# Patient Record
Sex: Male | Born: 1945 | Race: Black or African American | Hispanic: No | Marital: Single | Smoking: Current some day smoker
Health system: Southern US, Community
[De-identification: ages and names within clinical notes are randomized; demographics above are authoritative.]

## PROBLEM LIST (undated history)

## (undated) DIAGNOSIS — L723 Sebaceous cyst: Secondary | ICD-10-CM

## (undated) DIAGNOSIS — M109 Gout, unspecified: Secondary | ICD-10-CM

## (undated) DIAGNOSIS — I2510 Atherosclerotic heart disease of native coronary artery without angina pectoris: Secondary | ICD-10-CM

## (undated) DIAGNOSIS — E669 Obesity, unspecified: Secondary | ICD-10-CM

## (undated) DIAGNOSIS — I10 Essential (primary) hypertension: Secondary | ICD-10-CM

## (undated) DIAGNOSIS — A5149 Other secondary syphilitic conditions: Secondary | ICD-10-CM

## (undated) DIAGNOSIS — H269 Unspecified cataract: Secondary | ICD-10-CM

## (undated) DIAGNOSIS — F1910 Other psychoactive substance abuse, uncomplicated: Secondary | ICD-10-CM

## (undated) DIAGNOSIS — F1410 Cocaine abuse, uncomplicated: Secondary | ICD-10-CM

## (undated) DIAGNOSIS — F1010 Alcohol abuse, uncomplicated: Secondary | ICD-10-CM

## (undated) DIAGNOSIS — W3400XA Accidental discharge from unspecified firearms or gun, initial encounter: Secondary | ICD-10-CM

## (undated) DIAGNOSIS — B1920 Unspecified viral hepatitis C without hepatic coma: Secondary | ICD-10-CM

## (undated) DIAGNOSIS — E039 Hypothyroidism, unspecified: Secondary | ICD-10-CM

## (undated) DIAGNOSIS — F32A Depression, unspecified: Secondary | ICD-10-CM

## (undated) DIAGNOSIS — K7460 Unspecified cirrhosis of liver: Secondary | ICD-10-CM

## (undated) DIAGNOSIS — N189 Chronic kidney disease, unspecified: Secondary | ICD-10-CM

## (undated) DIAGNOSIS — E785 Hyperlipidemia, unspecified: Secondary | ICD-10-CM

## (undated) DIAGNOSIS — M2550 Pain in unspecified joint: Secondary | ICD-10-CM

## (undated) DIAGNOSIS — F329 Major depressive disorder, single episode, unspecified: Secondary | ICD-10-CM

## (undated) HISTORY — PX: MULTIPLE TOOTH EXTRACTIONS: SHX2053

## (undated) HISTORY — PX: OTHER SURGICAL HISTORY: SHX169

## (undated) HISTORY — DX: Hyperlipidemia, unspecified: E78.5

## (undated) HISTORY — DX: Sebaceous cyst: L72.3

## (undated) HISTORY — DX: Alcohol abuse, uncomplicated: F10.10

## (undated) HISTORY — DX: Other secondary syphilitic conditions: A51.49

## (undated) HISTORY — DX: Chronic kidney disease, unspecified: N18.9

## (undated) HISTORY — DX: Obesity, unspecified: E66.9

## (undated) HISTORY — DX: Depression, unspecified: F32.A

## (undated) HISTORY — DX: Unspecified cataract: H26.9

## (undated) HISTORY — DX: Major depressive disorder, single episode, unspecified: F32.9

## (undated) HISTORY — DX: Essential (primary) hypertension: I10

## (undated) HISTORY — DX: Unspecified viral hepatitis C without hepatic coma: B19.20

---

## 1997-06-06 ENCOUNTER — Emergency Department (HOSPITAL_COMMUNITY): Admission: EM | Admit: 1997-06-06 | Discharge: 1997-06-06 | Payer: Self-pay | Admitting: *Deleted

## 1997-06-11 ENCOUNTER — Encounter: Admission: RE | Admit: 1997-06-11 | Discharge: 1997-06-11 | Payer: Self-pay | Admitting: Hematology and Oncology

## 1998-05-30 ENCOUNTER — Emergency Department (HOSPITAL_COMMUNITY): Admission: EM | Admit: 1998-05-30 | Discharge: 1998-05-30 | Payer: Self-pay | Admitting: Emergency Medicine

## 1998-05-30 ENCOUNTER — Encounter: Payer: Self-pay | Admitting: Emergency Medicine

## 1998-06-01 ENCOUNTER — Encounter: Payer: Self-pay | Admitting: Emergency Medicine

## 1998-06-01 ENCOUNTER — Emergency Department (HOSPITAL_COMMUNITY): Admission: EM | Admit: 1998-06-01 | Discharge: 1998-06-01 | Payer: Self-pay | Admitting: Emergency Medicine

## 1998-06-02 ENCOUNTER — Emergency Department (HOSPITAL_COMMUNITY): Admission: EM | Admit: 1998-06-02 | Discharge: 1998-06-02 | Payer: Self-pay | Admitting: Emergency Medicine

## 1998-06-04 ENCOUNTER — Emergency Department (HOSPITAL_COMMUNITY): Admission: EM | Admit: 1998-06-04 | Discharge: 1998-06-04 | Payer: Self-pay | Admitting: Emergency Medicine

## 1998-06-09 ENCOUNTER — Encounter: Admission: RE | Admit: 1998-06-09 | Discharge: 1998-06-09 | Payer: Self-pay | Admitting: Internal Medicine

## 1999-07-23 ENCOUNTER — Emergency Department (HOSPITAL_COMMUNITY): Admission: EM | Admit: 1999-07-23 | Discharge: 1999-07-23 | Payer: Self-pay | Admitting: Emergency Medicine

## 2005-01-27 ENCOUNTER — Emergency Department (HOSPITAL_COMMUNITY): Admission: EM | Admit: 2005-01-27 | Discharge: 2005-01-27 | Payer: Self-pay | Admitting: Family Medicine

## 2005-07-25 ENCOUNTER — Ambulatory Visit: Payer: Self-pay | Admitting: Internal Medicine

## 2005-07-31 ENCOUNTER — Ambulatory Visit: Payer: Self-pay | Admitting: Internal Medicine

## 2005-08-07 ENCOUNTER — Ambulatory Visit: Payer: Self-pay | Admitting: Internal Medicine

## 2005-08-14 ENCOUNTER — Ambulatory Visit: Payer: Self-pay | Admitting: Internal Medicine

## 2005-09-14 ENCOUNTER — Ambulatory Visit: Payer: Self-pay | Admitting: Internal Medicine

## 2005-10-13 ENCOUNTER — Ambulatory Visit: Payer: Self-pay | Admitting: Internal Medicine

## 2005-10-27 ENCOUNTER — Ambulatory Visit: Payer: Self-pay | Admitting: Internal Medicine

## 2005-11-06 ENCOUNTER — Encounter (INDEPENDENT_AMBULATORY_CARE_PROVIDER_SITE_OTHER): Payer: Self-pay | Admitting: Internal Medicine

## 2005-11-06 ENCOUNTER — Ambulatory Visit: Payer: Self-pay | Admitting: Internal Medicine

## 2005-11-06 LAB — CONVERTED CEMR LAB: HCV Quantitative: 2190000 intl units/mL — ABNORMAL HIGH (ref <5)

## 2007-05-22 ENCOUNTER — Ambulatory Visit: Payer: Self-pay | Admitting: Hospitalist

## 2007-05-22 ENCOUNTER — Encounter (INDEPENDENT_AMBULATORY_CARE_PROVIDER_SITE_OTHER): Payer: Self-pay | Admitting: *Deleted

## 2007-05-22 DIAGNOSIS — F1010 Alcohol abuse, uncomplicated: Secondary | ICD-10-CM | POA: Insufficient documentation

## 2007-05-22 DIAGNOSIS — E785 Hyperlipidemia, unspecified: Secondary | ICD-10-CM

## 2007-05-22 DIAGNOSIS — F17200 Nicotine dependence, unspecified, uncomplicated: Secondary | ICD-10-CM

## 2007-05-22 DIAGNOSIS — I10 Essential (primary) hypertension: Secondary | ICD-10-CM | POA: Insufficient documentation

## 2007-05-22 DIAGNOSIS — B1710 Acute hepatitis C without hepatic coma: Secondary | ICD-10-CM | POA: Insufficient documentation

## 2007-05-22 DIAGNOSIS — E669 Obesity, unspecified: Secondary | ICD-10-CM

## 2007-05-22 DIAGNOSIS — D696 Thrombocytopenia, unspecified: Secondary | ICD-10-CM

## 2007-05-22 DIAGNOSIS — Z9189 Other specified personal risk factors, not elsewhere classified: Secondary | ICD-10-CM | POA: Insufficient documentation

## 2007-05-23 LAB — CONVERTED CEMR LAB
ALT: 65 units/L — ABNORMAL HIGH (ref 0–53)
AST: 62 units/L — ABNORMAL HIGH (ref 0–37)
Albumin: 3.7 g/dL (ref 3.5–5.2)
Alkaline Phosphatase: 105 units/L (ref 39–117)
BUN: 12 mg/dL (ref 6–23)
CO2: 27 meq/L (ref 19–32)
Calcium: 9.5 mg/dL (ref 8.4–10.5)
Chloride: 107 meq/L (ref 96–112)
Cholesterol: 147 mg/dL (ref 0–200)
Creatinine, Ser: 1.23 mg/dL (ref 0.40–1.50)
Glucose, Bld: 105 mg/dL — ABNORMAL HIGH (ref 70–99)
HCT: 42.3 % (ref 39.0–52.0)
HDL: 25 mg/dL — ABNORMAL LOW (ref >39)
Hemoglobin: 14.4 g/dL (ref 13.0–17.0)
INR: 1 (ref 0.0–1.5)
MCHC: 34 g/dL (ref 30.0–36.0)
MCV: 85.7 fL (ref 78.0–100.0)
Platelets: 120 10*3/uL — ABNORMAL LOW (ref 150–400)
Potassium: 4.1 meq/L (ref 3.5–5.3)
Prothrombin Time: 13.5 s (ref 11.6–15.2)
RBC: 4.94 M/uL (ref 4.22–5.81)
RDW: 13.4 % (ref 11.5–15.5)
Sodium: 140 meq/L (ref 135–145)
TSH: 3.116 microintl units/mL (ref 0.350–5.50)
Total Bilirubin: 0.9 mg/dL (ref 0.3–1.2)
Total CHOL/HDL Ratio: 5.9
Total Protein: 8.3 g/dL (ref 6.0–8.3)
Triglycerides: 428 mg/dL — ABNORMAL HIGH (ref <150)
WBC: 7.6 10*3/uL (ref 4.0–10.5)

## 2007-05-28 ENCOUNTER — Emergency Department (HOSPITAL_COMMUNITY): Admission: EM | Admit: 2007-05-28 | Discharge: 2007-05-29 | Payer: Self-pay | Admitting: Emergency Medicine

## 2007-06-06 ENCOUNTER — Encounter: Payer: Self-pay | Admitting: Licensed Clinical Social Worker

## 2007-06-06 ENCOUNTER — Encounter (INDEPENDENT_AMBULATORY_CARE_PROVIDER_SITE_OTHER): Payer: Self-pay | Admitting: Internal Medicine

## 2007-06-06 ENCOUNTER — Ambulatory Visit: Payer: Self-pay | Admitting: Internal Medicine

## 2007-06-07 ENCOUNTER — Encounter (INDEPENDENT_AMBULATORY_CARE_PROVIDER_SITE_OTHER): Payer: Self-pay | Admitting: Internal Medicine

## 2007-06-07 LAB — CONVERTED CEMR LAB
BUN: 18 mg/dL (ref 6–23)
CO2: 24 meq/L (ref 19–32)
Calcium: 9.4 mg/dL (ref 8.4–10.5)
Chloride: 106 meq/L (ref 96–112)
Creatinine, Ser: 1.21 mg/dL (ref 0.40–1.50)
Glucose, Bld: 117 mg/dL — ABNORMAL HIGH (ref 70–99)
HCV Ab: POSITIVE — AB
Potassium: 4.7 meq/L (ref 3.5–5.3)
Sodium: 141 meq/L (ref 135–145)

## 2007-06-10 ENCOUNTER — Ambulatory Visit (HOSPITAL_COMMUNITY): Admission: RE | Admit: 2007-06-10 | Discharge: 2007-06-10 | Payer: Self-pay | Admitting: Internal Medicine

## 2007-06-11 ENCOUNTER — Telehealth (INDEPENDENT_AMBULATORY_CARE_PROVIDER_SITE_OTHER): Payer: Self-pay | Admitting: Internal Medicine

## 2007-06-11 ENCOUNTER — Telehealth: Payer: Self-pay | Admitting: *Deleted

## 2007-06-28 ENCOUNTER — Encounter (INDEPENDENT_AMBULATORY_CARE_PROVIDER_SITE_OTHER): Payer: Self-pay | Admitting: Internal Medicine

## 2007-06-28 ENCOUNTER — Ambulatory Visit: Payer: Self-pay | Admitting: Internal Medicine

## 2007-07-02 LAB — CONVERTED CEMR LAB: HCV Quantitative: 3360000 intl units/mL — ABNORMAL HIGH (ref <43)

## 2007-07-03 ENCOUNTER — Encounter (INDEPENDENT_AMBULATORY_CARE_PROVIDER_SITE_OTHER): Payer: Self-pay | Admitting: *Deleted

## 2007-07-03 ENCOUNTER — Ambulatory Visit: Payer: Self-pay | Admitting: *Deleted

## 2007-07-03 ENCOUNTER — Encounter (INDEPENDENT_AMBULATORY_CARE_PROVIDER_SITE_OTHER): Payer: Self-pay | Admitting: Internal Medicine

## 2007-07-03 DIAGNOSIS — K769 Liver disease, unspecified: Secondary | ICD-10-CM | POA: Insufficient documentation

## 2007-07-03 LAB — CONVERTED CEMR LAB
Hep B Core Total Ab: POSITIVE — AB
Hepatitis B Surface Ag: NEGATIVE

## 2007-07-05 ENCOUNTER — Telehealth: Payer: Self-pay | Admitting: *Deleted

## 2007-07-16 ENCOUNTER — Ambulatory Visit: Payer: Self-pay | Admitting: Internal Medicine

## 2007-07-16 ENCOUNTER — Encounter (INDEPENDENT_AMBULATORY_CARE_PROVIDER_SITE_OTHER): Payer: Self-pay | Admitting: *Deleted

## 2007-07-16 ENCOUNTER — Encounter (INDEPENDENT_AMBULATORY_CARE_PROVIDER_SITE_OTHER): Payer: Self-pay | Admitting: Internal Medicine

## 2007-07-16 DIAGNOSIS — B181 Chronic viral hepatitis B without delta-agent: Secondary | ICD-10-CM

## 2007-07-16 LAB — CONVERTED CEMR LAB: Hep B S Ab: POSITIVE — AB

## 2007-08-06 ENCOUNTER — Telehealth: Payer: Self-pay | Admitting: *Deleted

## 2007-09-03 ENCOUNTER — Telehealth: Payer: Self-pay | Admitting: *Deleted

## 2007-09-06 ENCOUNTER — Encounter: Payer: Self-pay | Admitting: *Deleted

## 2007-09-06 ENCOUNTER — Ambulatory Visit: Payer: Self-pay | Admitting: *Deleted

## 2007-09-06 ENCOUNTER — Encounter (INDEPENDENT_AMBULATORY_CARE_PROVIDER_SITE_OTHER): Payer: Self-pay | Admitting: Internal Medicine

## 2007-09-06 DIAGNOSIS — N183 Chronic kidney disease, stage 3 unspecified: Secondary | ICD-10-CM | POA: Insufficient documentation

## 2007-09-06 LAB — CONVERTED CEMR LAB
ALT: 58 units/L — ABNORMAL HIGH (ref 0–53)
AST: 65 units/L — ABNORMAL HIGH (ref 0–37)
Albumin: 3.3 g/dL — ABNORMAL LOW (ref 3.5–5.2)
Alkaline Phosphatase: 97 units/L (ref 39–117)
BUN: 19 mg/dL (ref 6–23)
Basophils Absolute: 0.2 10*3/uL — ABNORMAL HIGH (ref 0.0–0.1)
Basophils Relative: 0 % (ref 0–1)
CO2: 26 meq/L (ref 19–32)
Calcium: 8.9 mg/dL (ref 8.4–10.5)
Chloride: 108 meq/L (ref 96–112)
Creatinine, Ser: 1.7 mg/dL — ABNORMAL HIGH (ref 0.40–1.50)
Eosinophils Absolute: 0.2 10*3/uL (ref 0.0–0.7)
Eosinophils Relative: 3 % (ref 0–5)
Glucose, Bld: 113 mg/dL — ABNORMAL HIGH (ref 70–99)
HCT: 38.9 % — ABNORMAL LOW (ref 39.0–52.0)
Hemoglobin: 13.4 g/dL (ref 13.0–17.0)
INR: 1 (ref 0.0–1.5)
Lipase: 51 units/L (ref 11–59)
Lymphocytes Relative: 43 % (ref 12–46)
Lymphs Abs: 3.3 10*3/uL (ref 0.7–4.0)
MCHC: 34.5 g/dL (ref 30.0–36.0)
MCV: 86.1 fL (ref 78.0–100.0)
Monocytes Absolute: 1 10*3/uL (ref 0.1–1.0)
Monocytes Relative: 13 % — ABNORMAL HIGH (ref 3–12)
Neutro Abs: 3.2 10*3/uL (ref 1.7–7.7)
Neutrophils Relative %: 41 % — ABNORMAL LOW (ref 43–77)
Platelets: 121 10*3/uL — ABNORMAL LOW (ref 150–400)
Potassium: 4.1 meq/L (ref 3.5–5.3)
Prothrombin Time: 13.5 s (ref 11.6–15.2)
RBC: 4.53 M/uL (ref 4.22–5.81)
RDW: 13.5 % (ref 11.5–15.5)
Sodium: 141 meq/L (ref 135–145)
Total Bilirubin: 0.9 mg/dL (ref 0.3–1.2)
Total Protein: 7.3 g/dL (ref 6.0–8.3)
WBC: 7.7 10*3/uL (ref 4.0–10.5)

## 2007-09-11 ENCOUNTER — Ambulatory Visit: Payer: Self-pay | Admitting: Infectious Diseases

## 2007-09-11 ENCOUNTER — Encounter (INDEPENDENT_AMBULATORY_CARE_PROVIDER_SITE_OTHER): Payer: Self-pay | Admitting: *Deleted

## 2007-09-12 DIAGNOSIS — R799 Abnormal finding of blood chemistry, unspecified: Secondary | ICD-10-CM

## 2007-09-12 LAB — CONVERTED CEMR LAB
AFP-Tumor Marker: 25.9 ng/mL — ABNORMAL HIGH (ref 0.0–8.0)
ALT: 47 units/L (ref 0–53)
AST: 52 units/L — ABNORMAL HIGH (ref 0–37)
Albumin: 3.7 g/dL (ref 3.5–5.2)
Alkaline Phosphatase: 95 units/L (ref 39–117)
BUN: 22 mg/dL (ref 6–23)
CO2: 21 meq/L (ref 19–32)
Calcium: 8.6 mg/dL (ref 8.4–10.5)
Chloride: 111 meq/L (ref 96–112)
Creatinine, Ser: 1.39 mg/dL (ref 0.40–1.50)
Glucose, Bld: 125 mg/dL — ABNORMAL HIGH (ref 70–99)
Potassium: 4 meq/L (ref 3.5–5.3)
Sodium: 144 meq/L (ref 135–145)
Total Bilirubin: 0.7 mg/dL (ref 0.3–1.2)
Total Protein: 7.5 g/dL (ref 6.0–8.3)

## 2007-09-16 ENCOUNTER — Ambulatory Visit (HOSPITAL_COMMUNITY): Admission: RE | Admit: 2007-09-16 | Discharge: 2007-09-16 | Payer: Self-pay | Admitting: *Deleted

## 2007-09-24 ENCOUNTER — Telehealth (INDEPENDENT_AMBULATORY_CARE_PROVIDER_SITE_OTHER): Payer: Self-pay | Admitting: Internal Medicine

## 2007-11-15 ENCOUNTER — Encounter (INDEPENDENT_AMBULATORY_CARE_PROVIDER_SITE_OTHER): Payer: Self-pay | Admitting: Internal Medicine

## 2007-11-15 ENCOUNTER — Ambulatory Visit: Payer: Self-pay | Admitting: Internal Medicine

## 2007-11-20 LAB — CONVERTED CEMR LAB
ALT: 47 units/L (ref 0–53)
AST: 57 units/L — ABNORMAL HIGH (ref 0–37)
Albumin: 3.5 g/dL (ref 3.5–5.2)
Alkaline Phosphatase: 94 units/L (ref 39–117)
BUN: 10 mg/dL (ref 6–23)
CO2: 26 meq/L (ref 19–32)
Calcium: 9.5 mg/dL (ref 8.4–10.5)
Chloride: 110 meq/L (ref 96–112)
Creatinine, Ser: 1.07 mg/dL (ref 0.40–1.50)
Creatinine, Urine: 153.3 mg/dL
Glucose, Bld: 96 mg/dL (ref 70–99)
HCT: 40 % (ref 39.0–52.0)
Hemoglobin: 13.7 g/dL (ref 13.0–17.0)
INR: 1 (ref 0.0–1.5)
MCHC: 29.1 g/dL — ABNORMAL LOW (ref 30.0–36.0)
MCV: 84.8 fL (ref 78.0–100.0)
Microalb Creat Ratio: 35.8 mg/g — ABNORMAL HIGH (ref 0.0–30.0)
Microalb, Ur: 5.49 mg/dL — ABNORMAL HIGH (ref 0.00–1.89)
Platelets: 159 10*3/uL (ref 150–400)
Potassium: 4.2 meq/L (ref 3.5–5.3)
Prothrombin Time: 13.9 s (ref 11.6–15.2)
RBC: 4.72 M/uL (ref 4.22–5.81)
RDW: 13.1 % (ref 11.5–15.5)
Sodium: 143 meq/L (ref 135–145)
Total Bilirubin: 0.6 mg/dL (ref 0.3–1.2)
Total Protein: 7.6 g/dL (ref 6.0–8.3)
WBC: 7 10*3/uL (ref 4.0–10.5)

## 2008-01-13 ENCOUNTER — Emergency Department (HOSPITAL_COMMUNITY): Admission: EM | Admit: 2008-01-13 | Discharge: 2008-01-13 | Payer: Self-pay | Admitting: Emergency Medicine

## 2008-01-14 ENCOUNTER — Emergency Department (HOSPITAL_COMMUNITY): Admission: EM | Admit: 2008-01-14 | Discharge: 2008-01-14 | Payer: Self-pay | Admitting: Emergency Medicine

## 2008-01-16 ENCOUNTER — Emergency Department (HOSPITAL_COMMUNITY): Admission: EM | Admit: 2008-01-16 | Discharge: 2008-01-16 | Payer: Self-pay | Admitting: Family Medicine

## 2008-01-20 ENCOUNTER — Emergency Department (HOSPITAL_COMMUNITY): Admission: EM | Admit: 2008-01-20 | Discharge: 2008-01-20 | Payer: Self-pay | Admitting: Emergency Medicine

## 2008-04-14 ENCOUNTER — Ambulatory Visit: Payer: Self-pay | Admitting: *Deleted

## 2008-04-14 ENCOUNTER — Encounter (INDEPENDENT_AMBULATORY_CARE_PROVIDER_SITE_OTHER): Payer: Self-pay | Admitting: Internal Medicine

## 2008-04-15 LAB — CONVERTED CEMR LAB
ALT: 54 units/L — ABNORMAL HIGH (ref 0–53)
AST: 61 units/L — ABNORMAL HIGH (ref 0–37)
Albumin: 3.5 g/dL (ref 3.5–5.2)
Alkaline Phosphatase: 113 units/L (ref 39–117)
BUN: 11 mg/dL (ref 6–23)
Basophils Absolute: 0 10*3/uL (ref 0.0–0.1)
Basophils Relative: 0 % (ref 0–1)
CO2: 29 meq/L (ref 19–32)
Calcium: 9.4 mg/dL (ref 8.4–10.5)
Chloride: 108 meq/L (ref 96–112)
Cholesterol: 150 mg/dL (ref 0–200)
Creatinine, Ser: 1.14 mg/dL (ref 0.40–1.50)
Eosinophils Absolute: 0.1 10*3/uL (ref 0.0–0.7)
Eosinophils Relative: 1 % (ref 0–5)
Glucose, Bld: 100 mg/dL — ABNORMAL HIGH (ref 70–99)
HCT: 43.7 % (ref 39.0–52.0)
HDL: 23 mg/dL — ABNORMAL LOW (ref >39)
Hemoglobin: 15.1 g/dL (ref 13.0–17.0)
INR: 1.1 (ref 0.0–1.5)
LDL Cholesterol: 75 mg/dL (ref 0–99)
Lymphocytes Relative: 43 % (ref 12–46)
Lymphs Abs: 3.2 10*3/uL (ref 0.7–4.0)
MCHC: 29.2 g/dL — ABNORMAL LOW (ref 30.0–36.0)
MCV: 84.5 fL (ref 78.0–100.0)
Monocytes Absolute: 0.7 10*3/uL (ref 0.1–1.0)
Monocytes Relative: 10 % (ref 3–12)
Neutro Abs: 3.4 10*3/uL (ref 1.7–7.7)
Neutrophils Relative %: 46 % (ref 43–77)
Platelets: 117 10*3/uL — ABNORMAL LOW (ref 150–400)
Potassium: 4.1 meq/L (ref 3.5–5.3)
Prothrombin Time: 14.3 s (ref 11.6–15.2)
RBC: 5.18 M/uL (ref 4.22–5.81)
RDW: 13.7 % (ref 11.5–15.5)
Sodium: 142 meq/L (ref 135–145)
TSH: 1.641 microintl units/mL (ref 0.350–4.500)
Total Bilirubin: 0.7 mg/dL (ref 0.3–1.2)
Total CHOL/HDL Ratio: 6.5
Total Protein: 7.6 g/dL (ref 6.0–8.3)
Triglycerides: 262 mg/dL — ABNORMAL HIGH (ref <150)
VLDL: 52 mg/dL — ABNORMAL HIGH (ref 0–40)
WBC: 7.4 10*3/uL (ref 4.0–10.5)

## 2008-05-07 ENCOUNTER — Ambulatory Visit: Payer: Self-pay | Admitting: Gastroenterology

## 2008-05-07 ENCOUNTER — Encounter (INDEPENDENT_AMBULATORY_CARE_PROVIDER_SITE_OTHER): Payer: Self-pay | Admitting: Internal Medicine

## 2008-05-15 ENCOUNTER — Ambulatory Visit: Payer: Self-pay | Admitting: Gastroenterology

## 2008-05-18 ENCOUNTER — Encounter: Payer: Self-pay | Admitting: Gastroenterology

## 2008-05-19 ENCOUNTER — Encounter (INDEPENDENT_AMBULATORY_CARE_PROVIDER_SITE_OTHER): Payer: Self-pay | Admitting: Internal Medicine

## 2008-05-26 ENCOUNTER — Ambulatory Visit: Payer: Self-pay | Admitting: Gastroenterology

## 2008-05-27 ENCOUNTER — Ambulatory Visit: Payer: Self-pay | Admitting: Gastroenterology

## 2008-05-27 ENCOUNTER — Encounter: Payer: Self-pay | Admitting: Gastroenterology

## 2008-05-28 ENCOUNTER — Telehealth: Payer: Self-pay | Admitting: Gastroenterology

## 2008-05-29 ENCOUNTER — Encounter: Payer: Self-pay | Admitting: Gastroenterology

## 2008-06-02 ENCOUNTER — Encounter: Payer: Self-pay | Admitting: Gastroenterology

## 2008-06-02 DIAGNOSIS — I8500 Esophageal varices without bleeding: Secondary | ICD-10-CM

## 2008-06-04 ENCOUNTER — Encounter: Payer: Self-pay | Admitting: Gastroenterology

## 2008-07-16 ENCOUNTER — Ambulatory Visit (HOSPITAL_COMMUNITY): Admission: RE | Admit: 2008-07-16 | Discharge: 2008-07-16 | Payer: Self-pay | Admitting: Gastroenterology

## 2008-07-21 ENCOUNTER — Ambulatory Visit: Payer: Self-pay | Admitting: Internal Medicine

## 2008-10-14 ENCOUNTER — Emergency Department (HOSPITAL_COMMUNITY): Admission: EM | Admit: 2008-10-14 | Discharge: 2008-10-14 | Payer: Self-pay | Admitting: Family Medicine

## 2008-10-20 ENCOUNTER — Ambulatory Visit (HOSPITAL_COMMUNITY): Admission: RE | Admit: 2008-10-20 | Discharge: 2008-10-20 | Payer: Self-pay | Admitting: Gastroenterology

## 2008-12-21 ENCOUNTER — Ambulatory Visit: Payer: Self-pay | Admitting: Infectious Disease

## 2008-12-21 ENCOUNTER — Encounter (INDEPENDENT_AMBULATORY_CARE_PROVIDER_SITE_OTHER): Payer: Self-pay | Admitting: Dermatology

## 2008-12-21 ENCOUNTER — Telehealth: Payer: Self-pay | Admitting: *Deleted

## 2008-12-21 DIAGNOSIS — L0390 Cellulitis, unspecified: Secondary | ICD-10-CM

## 2008-12-21 DIAGNOSIS — M79609 Pain in unspecified limb: Secondary | ICD-10-CM

## 2008-12-21 DIAGNOSIS — L0291 Cutaneous abscess, unspecified: Secondary | ICD-10-CM

## 2008-12-21 LAB — CONVERTED CEMR LAB
ALT: 70 units/L — ABNORMAL HIGH (ref 0–53)
AST: 89 units/L — ABNORMAL HIGH (ref 0–37)
Albumin: 3.4 g/dL — ABNORMAL LOW (ref 3.5–5.2)
Alkaline Phosphatase: 110 units/L (ref 39–117)
BUN: 18 mg/dL (ref 6–23)
Basophils Absolute: 0 10*3/uL (ref 0.0–0.1)
Basophils Relative: 0 % (ref 0–1)
CO2: 27 meq/L (ref 19–32)
Calcium: 9.2 mg/dL (ref 8.4–10.5)
Chloride: 107 meq/L (ref 96–112)
Creatinine, Ser: 1.23 mg/dL (ref 0.40–1.50)
Eosinophils Absolute: 0.1 10*3/uL (ref 0.0–0.7)
Eosinophils Relative: 2 % (ref 0–5)
Glucose, Bld: 99 mg/dL (ref 70–99)
HCT: 41.6 % (ref 39.0–52.0)
Hemoglobin: 14.6 g/dL (ref 13.0–17.0)
Lymphocytes Relative: 40 % (ref 12–46)
Lymphs Abs: 3.1 10*3/uL (ref 0.7–4.0)
MCHC: 35.1 g/dL (ref 30.0–36.0)
MCV: 87.1 fL (ref >=78.0)
Monocytes Absolute: 0.9 10*3/uL (ref 0.1–1.0)
Monocytes Relative: 12 % (ref 3–12)
Neutro Abs: 3.5 10*3/uL (ref 1.7–7.7)
Neutrophils Relative %: 46 % (ref 43–77)
Platelets: 151 10*3/uL (ref 150–400)
Potassium: 4.5 meq/L (ref 3.5–5.3)
RBC: 4.78 M/uL (ref 4.22–5.81)
RDW: 12.9 % (ref 11.5–15.5)
Sodium: 140 meq/L (ref 135–145)
Total Bilirubin: 1 mg/dL (ref 0.3–1.2)
Total Protein: 8.4 g/dL — ABNORMAL HIGH (ref 6.0–8.3)
WBC: 7.7 10*3/uL (ref 4.0–10.5)

## 2009-07-21 ENCOUNTER — Telehealth: Payer: Self-pay | Admitting: Internal Medicine

## 2009-07-26 ENCOUNTER — Ambulatory Visit: Payer: Self-pay | Admitting: Internal Medicine

## 2009-07-26 LAB — CONVERTED CEMR LAB
ALT: 53 units/L (ref 0–53)
AST: 72 units/L — ABNORMAL HIGH (ref 0–37)
Albumin: 3.8 g/dL (ref 3.5–5.2)
Alkaline Phosphatase: 106 units/L (ref 39–117)
BUN: 13 mg/dL (ref 6–23)
Basophils Absolute: 0 10*3/uL (ref 0.0–0.1)
Basophils Relative: 0 % (ref 0–1)
CO2: 27 meq/L (ref 19–32)
Calcium: 9.1 mg/dL (ref 8.4–10.5)
Chloride: 108 meq/L (ref 96–112)
Cholesterol: 159 mg/dL (ref 0–200)
Creatinine, Ser: 1.19 mg/dL (ref 0.40–1.50)
Eosinophils Absolute: 0.1 10*3/uL (ref 0.0–0.7)
Eosinophils Relative: 1 % (ref 0–5)
Glucose, Bld: 86 mg/dL (ref 70–99)
HCT: 40.7 % (ref 39.0–52.0)
HDL: 20 mg/dL — ABNORMAL LOW (ref >39)
Hemoglobin: 14.3 g/dL (ref 13.0–17.0)
Hgb A1c MFr Bld: 5.8 %
LDL Cholesterol: 72 mg/dL (ref 0–99)
Lymphocytes Relative: 49 % — ABNORMAL HIGH (ref 12–46)
Lymphs Abs: 3.4 10*3/uL (ref 0.7–4.0)
MCHC: 35.1 g/dL (ref 30.0–36.0)
MCV: 82.9 fL (ref >=78.0)
Magnesium: 2.2 mg/dL (ref 1.5–2.5)
Monocytes Absolute: 0.9 10*3/uL (ref 0.1–1.0)
Monocytes Relative: 14 % — ABNORMAL HIGH (ref 3–12)
Neutro Abs: 2.5 10*3/uL (ref 1.7–7.7)
Neutrophils Relative %: 36 % — ABNORMAL LOW (ref 43–77)
Platelets: 103 10*3/uL — ABNORMAL LOW (ref 150–400)
Potassium: 4.3 meq/L (ref 3.5–5.3)
RBC: 4.91 M/uL (ref 4.22–5.81)
RDW: 13.9 % (ref 11.5–15.5)
Sodium: 144 meq/L (ref 135–145)
Total Bilirubin: 1 mg/dL (ref 0.3–1.2)
Total CHOL/HDL Ratio: 8
Total Protein: 8.1 g/dL (ref 6.0–8.3)
Triglycerides: 336 mg/dL — ABNORMAL HIGH (ref <150)
VLDL: 67 mg/dL — ABNORMAL HIGH (ref 0–40)
WBC: 6.9 10*3/uL (ref 4.0–10.5)

## 2009-12-13 ENCOUNTER — Encounter: Payer: Self-pay | Admitting: Internal Medicine

## 2010-02-10 ENCOUNTER — Emergency Department (HOSPITAL_COMMUNITY)
Admission: EM | Admit: 2010-02-10 | Discharge: 2010-02-10 | Payer: Self-pay | Source: Home / Self Care | Admitting: Emergency Medicine

## 2010-02-14 LAB — DIFFERENTIAL
Basophils Absolute: 0 10*3/uL (ref 0.0–0.1)
Basophils Relative: 0 % (ref 0–1)
Eosinophils Absolute: 0.1 10*3/uL (ref 0.0–0.7)
Eosinophils Relative: 1 % (ref 0–5)
Lymphocytes Relative: 42 % (ref 12–46)
Lymphs Abs: 3.5 10*3/uL (ref 0.7–4.0)
Monocytes Absolute: 0.6 10*3/uL (ref 0.1–1.0)
Monocytes Relative: 7 % (ref 3–12)
Neutro Abs: 4.2 10*3/uL (ref 1.7–7.7)
Neutrophils Relative %: 50 % (ref 43–77)

## 2010-02-14 LAB — COMPREHENSIVE METABOLIC PANEL
ALT: 50 U/L (ref 0–53)
AST: 63 U/L — ABNORMAL HIGH (ref 0–37)
Albumin: 3.6 g/dL (ref 3.5–5.2)
Alkaline Phosphatase: 100 U/L (ref 39–117)
BUN: 11 mg/dL (ref 6–23)
CO2: 27 mEq/L (ref 19–32)
Calcium: 9.1 mg/dL (ref 8.4–10.5)
Chloride: 106 mEq/L (ref 96–112)
Creatinine, Ser: 1.23 mg/dL (ref 0.4–1.5)
GFR calc Af Amer: >60 mL/min (ref >60)
GFR calc non Af Amer: 60 mL/min — ABNORMAL LOW (ref >60)
Glucose, Bld: 136 mg/dL — ABNORMAL HIGH (ref 70–99)
Potassium: 4.1 mEq/L (ref 3.5–5.1)
Sodium: 141 mEq/L (ref 135–145)
Total Bilirubin: 1.2 mg/dL (ref 0.3–1.2)
Total Protein: 7.7 g/dL (ref 6.0–8.3)

## 2010-02-14 LAB — RAPID URINE DRUG SCREEN, HOSP PERFORMED
Amphetamines: NOT DETECTED
Barbiturates: NOT DETECTED
Benzodiazepines: NOT DETECTED
Cocaine: POSITIVE — AB
Opiates: NOT DETECTED
Tetrahydrocannabinol: NOT DETECTED

## 2010-02-14 LAB — CBC
HCT: 41.8 % (ref 39.0–52.0)
Hemoglobin: 15.1 g/dL (ref 13.0–17.0)
MCH: 29.8 pg (ref 26.0–34.0)
MCHC: 36.1 g/dL — ABNORMAL HIGH (ref 30.0–36.0)
MCV: 82.4 fL (ref 78.0–100.0)
Platelets: 104 10*3/uL — ABNORMAL LOW (ref 150–400)
RBC: 5.07 MIL/uL (ref 4.22–5.81)
RDW: 13.1 % (ref 11.5–15.5)
WBC: 8.4 10*3/uL (ref 4.0–10.5)

## 2010-02-14 LAB — ACETAMINOPHEN LEVEL: Acetaminophen (Tylenol), Serum: <10 ug/mL — ABNORMAL LOW (ref 10–30)

## 2010-02-14 LAB — ETHANOL: Alcohol, Ethyl (B): <5 mg/dL (ref 0–10)

## 2010-02-21 ENCOUNTER — Encounter: Payer: Self-pay | Admitting: Gastroenterology

## 2010-03-01 NOTE — Miscellaneous (Signed)
Summary: [facility]  [facility]   Imported By: Garlan Fillers 12/17/2009 11:46:06  _____________________________________________________________________  External Attachment:    Type:   Image     Comment:   External Document

## 2010-03-01 NOTE — Assessment & Plan Note (Signed)
Summary: legs feel weak, upper R abd pain/pcp-vega/hla   Vital Signs:  Patient profile:   65 year old male Height:      71 inches (180.34 cm) Weight:      242.0 pounds (110.00 kg) BMI:     33.87 Temp:     98.4 degrees F (36.89 degrees C) oral Pulse rate:   82 / minute BP sitting:   181 / 94  (right arm)  Vitals Entered By: Hilda Blades Ditzler RN (July 26, 2009 3:31 PM) Is Patient Diabetic? No Pain Assessment Patient in pain? no      Nutritional Status BMI of > 30 = obese Nutritional Status Detail appetite good  Have you ever been in a relationship where you felt threatened, hurt or afraid?denies   Does patient need assistance? Functional Status Self care Ambulation Normal Comments Three family members died during 61 months.Needs ? labs and refills on meds. Out of meds 4-5 months.   Primary Care Provider:  Rudie Meyer MD   History of Present Illness: 65 y/o woman with PMh of HTN, HLD, hep C (never treated) comes for a follow up appontment. Was last seen 8-9 montjhs ago for an acute event. Has no coplaints today except want med refill for all his meds. Have not refilled meds since a yr now.   Depression History:      The patient denies a depressed mood most of the day and a diminished interest in his usual daily activities.         Preventive Screening-Counseling & Management  Alcohol-Tobacco     Alcohol drinks/day: 0     Alcohol type: beer - seldom     Smoking Status: current     Smoking Cessation Counseling: yes     Packs/Day: 1-2 cigs per day     Year Started: started back again     Year Quit: April,2009  Caffeine-Diet-Exercise     Does Patient Exercise: yes     Type of exercise: WALKING      Times/week: 1-2  Current Medications (verified): 1)  Amlodipine Besylate 10 Mg Tabs (Amlodipine Besylate) .... Take 1 Tablet By Mouth Once A Day 2)  Thiamine Hcl 100 Mg  Tabs (Thiamine Hcl) .... Take 1 Tablet By Mouth Once A Day 3)  Folic Acid 1 Mg  Tabs (Folic  Acid) .... Take 1 Tablet By Mouth Once A Day 4)  Aspirin 81 Mg  Tbec (Aspirin) .... Take 1 Tablet By Mouth Once A Day 5)  Lisinopril 10 Mg Tabs (Lisinopril) .... Take 1 Tablet By Mouth Once A Day 6)  Nicorette 2 Mg Gum (Nicotine Polacrilex) .... Use To Cut On Smoking  Allergies (verified): No Known Drug Allergies  Social History: Smoking Status:  current Packs/Day:  1-2 cigs per day  Review of Systems  The patient denies anorexia, fever, weight loss, weight gain, vision loss, decreased hearing, hoarseness, chest pain, syncope, dyspnea on exertion, peripheral edema, prolonged cough, headaches, hemoptysis, abdominal pain, melena, hematochezia, severe indigestion/heartburn, hematuria, incontinence, genital sores, muscle weakness, suspicious skin lesions, transient blindness, difficulty walking, depression, unusual weight change, abnormal bleeding, enlarged lymph nodes, angioedema, breast masses, and testicular masses.    Physical Exam  General:  alert and well-developed.   Head:  normocephalic and atraumatic.   Eyes:  vision grossly intact, pupils equal, pupils round, and pupils reactive to light.   Ears:  R ear normal and L ear normal.   Lungs:  normal respiratory effort, no intercostal retractions, no accessory muscle use,  and normal breath sounds.   Heart:  normal rate, regular rhythm, no murmur, and no gallop.   Abdomen:  soft, non-tender, normal bowel sounds, no distention, and no masses.   Pulses:  dorsalis pedis pulses normal bilaterally  Extremities:  no edem Neurologic:  OrientedX3, cranial nerver 2-12 intact,strength good in all extremities, sensations normal to light touch, reflexes 2+ b/l, gait normal    Impression & Recommendations:  Problem # 1:  HYPERTENSION (ICD-401.9) not controlled. Has been off meds since 1 yr. Will refill meds and recheck BPs in 1 month. Emphasized complaince and abstinence from alcahol and tobacco. Is starting to exercise.   His updated medication  list for this problem includes:    Amlodipine Besylate 10 Mg Tabs (Amlodipine besylate) .Marland Kitchen... Take 1 tablet by mouth once a day    Lisinopril 10 Mg Tabs (Lisinopril) .Marland Kitchen... Take 1 tablet by mouth once a day  Orders: T-CBC w/Diff [id number])  Problem # 2:  HYPERLIPIDEMIA (ICD-272.4) will check FLP,. will start statin after seeing the result.   Problem # 3:  HEPATITIS C (ICD-070.51) Has been followed at hep C clinic of [facility], Has elevated AFP but MRI in 9/10 did not show mass suggestive of Ca. Will continue to follow LFT and ask him to continue going to [facility] clinic pwriod[facility]. No treatment.   Problem # 4:  TOBACCO ABUSE (ICD-305.1) willl give nicotnine gums. Councelling provided.   His updated medication list for this problem includes:    Nicorette 2 Mg Gum (Nicotine polacrilex) ..... Use to cut on smoking  Complete Medication List: 1)  Amlodipine Besylate 10 Mg Tabs (Amlodipine besylate) .... Take 1 tablet by mouth once a day 2)  Thiamine Hcl 100 Mg Tabs (Thiamine hcl) .... Take 1 tablet by mouth once a day 3)  Folic Acid 1 Mg Tabs (Folic acid) .... Take 1 tablet by mouth once a day 4)  Aspirin 81 Mg Tbec (Aspirin) .... Take 1 tablet by mouth once a day 5)  Lisinopril 10 Mg Tabs (Lisinopril) .... Take 1 tablet by mouth once a day 6)  Nicorette 2 Mg Gum (Nicotine polacrilex) .... Use to cut on smoking  Other Orders: T-Hemoccult Card-Multiple (take home) ([id number]) T-Hgb A1C (in-house) [phone]) T-Lipid Profile [id number]) T-Comprehensive Metabolic Panel ([id number]) T-Magnesium PL:[id number])  Patient Instructions: 1)  Please schedule a follow-up appointment in 1 month. 2)  Tobacco is very bad for your health and your loved ones! You Should stop smoking!. 3)  Stop Smoking Tips: Choose a Quit date. Cut down before the Quit date. decide what you will do as a substitute when you feel the urge to smoke(gum,toothpick,exercise). 4)  It is important that you exercise regularly at least 20  minutes 5 times a week. If you develop chest pain, have severe difficulty breathing, or feel very tired , stop exercising immediately and seek medical attention. 5)  It is not healthy  for men to drink more than 2-3 drinks per day or for women to drink more than 1-2 drinks per day. 6)  Complete your Hemocult cards and return them soon. Prescriptions: NICORETTE 2 MG GUM (NICOTINE POLACRILEX) use to cut on smoking  #3 month supp x 3   Entered and Authorized by:   Lester Hallowell MD   Signed by:   Lester Wade MD on 07/26/2009   Method used:   Print then Give to Patient   [id number]:[id number] LISINOPRIL 10 MG TABS (LISINOPRIL) Take 1 tablet by mouth once a day  #  31 x 3   Entered and Authorized by:   Lester Rogers MD   Signed by:   Lester Cloverly MD on 07/26/2009   Method used:   Print then Give to Patient   [id number] ASPIRIN 81 MG  TBEC (ASPIRIN) Take 1 tablet by mouth once a day  #30 x 11   Entered and Authorized by:   Lester [place] MD   Signed by:   Lester Odum MD on 07/26/2009   Method used:   Print then Give to Patient   [id number] THIAMINE HCL 100 MG  TABS (THIAMINE HCL) Take 1 tablet by mouth once a day  #30 x 11   Entered and Authorized by:   Lester Colma MD   Signed by:   Lester [place] MD on 07/26/2009   Method used:   Print then Give to Patient   RxID:   [id number] FOLIC ACID 1 MG  TABS (FOLIC ACID) Take 1 tablet by mouth once a day  #30 x 11   Entered and Authorized by:   Lester Olmito and Olmito MD   Signed by:   Lester Benson MD on 07/26/2009   Method used:   Print then Give to Patient   RxIDWI:[id number] AMLODIPINE BESYLATE 10 MG TABS (AMLODIPINE BESYLATE) Take 1 tablet by mouth once a day  #31 x 3   Entered and Authorized by:   Lester Nehalem MD   Signed by:   Lester Hughes MD on 07/26/2009   Method used:   Print then Give to Patient   [id number] LISINOPRIL 10 MG TABS (LISINOPRIL) Take 1 tablet by mouth once a day  #31 x  3   Entered and Authorized by:   Lester [place] MD   Signed by:   Lester [place] MD on 07/26/2009   Method used:   Print then Give to Patient   [id number]:[id number] ASPIRIN 81 MG  TBEC (ASPIRIN) Take 1 tablet by mouth once a day  #30 x 11   Entered and Authorized by:   Lester [place] MD   Signed by:   Lester [place] MD on 07/26/2009   Method used:   Print then Give to Patient   RxID:   [id number] FOLIC ACID 1 MG  TABS (FOLIC ACID) Take 1 tablet by mouth once a day  #30 x 11   Entered and Authorized by:   Lester [place] MD   Signed by:   Lester [place] MD on 07/26/2009   Method used:   Print then Give to Patient   [id number] THIAMINE HCL 100 MG  TABS (THIAMINE HCL) Take 1 tablet by mouth once a day  #30 x 11   Entered and Authorized by:   Lester Lockney MD   Signed by:   Lester [place] MD on 07/26/2009   Method used:   Print then Give to Patient   RxIDHD:[id number] AMLODIPINE BESYLATE 10 MG TABS (AMLODIPINE BESYLATE) Take 1 tablet by mouth once a day  #31 x 3   Entered and Authorized by:   Lester [place] MD   Signed by:   Lester [place]wey MD on 07/26/2009   Method used:   Print then Give to Patient   [id number] NICORETTE 2 MG GUM (NICOTINE POLACRILEX) use to cut on smoking  #3 month supp x 3   Entered and Authorized by:   Lester  MD   Signed by:   Liana Gerold  Mariah Harn MD on 07/26/2009   Method used:   Print then Give to Patient   RxID:   [id number]  Process Orders Check Orders Results:     [facility]: ABN not required for this insurance Tests Sent for requisitioning (July 27, 2009 12:13 PM):     07/26/2009: [facility] -- T-Lipid Profile [id number] (signed)     07/26/2009: [facility] -- T-Comprehensive Metabolic Panel [id number] (signed)     07/26/2009: [facility] -- T-CBC w/Diff [id number] (signed)     07/26/2009: [facility] -- T-Magnesium  [id number] (signed)    Prevention & Chronic Care Immunizations   Influenza vaccine: Not documented   Influenza vaccine deferral: Refused  (12/21/2008)    Tetanus booster: Not documented   Td booster deferral: Deferred  (12/21/2008)   Tetanus booster due: 07/01/2018    Pneumococcal vaccine: Not documented   Pneumococcal vaccine deferral: Refused  (07/26/2009)    H. zoster vaccine: Not documented   H. zoster vaccine deferral: Deferred  (07/26/2009)  Colorectal Screening   Hemoccult: Not documented   Hemoccult action/deferral: Ordered  (07/26/2009)    Colonoscopy: Location:  [place].    (05/27/2008)   Colonoscopy due: 05/2013  Other Screening   PSA: Not documented   Smoking status: current  (07/26/2009)   Smoking cessation counseling: yes  (07/26/2009)  Lipids   Total Cholesterol: 150  (04/14/2008)   Lipid panel action/deferral: Lipid Panel ordered   LDL: 75  (04/14/2008)   LDL Direct: Not documented   HDL: 23  (04/14/2008)   Triglycerides: 262  (04/14/2008)    SGOT (AST): 89  (12/21/2008)   SGPT (ALT): 70  (12/21/2008) CMP ordered    Alkaline phosphatase: 110  (12/21/2008)   Total bilirubin: 1.0  (12/21/2008)    Lipid flowsheet reviewed?: Yes   Progress toward LDL goal: Unchanged  Hypertension   Last Blood Pressure: 181 / 94  (07/26/2009)   Serum creatinine: 1.23  (12/21/2008)   Serum potassium 4.5  (12/21/2008) CMP ordered     Hypertension flowsheet reviewed?: Yes   Progress toward BP goal: Deteriorated  Self-Management Support :   Personal Goals (by the next clinic visit) :      Personal blood pressure goal: 140/90  (12/21/2008)     Personal LDL goal: 100  (12/21/2008)    Patient will work on the following items until the next clinic visit to reach self-care goals:     Medications and monitoring: take my medicines every day  (12/21/2008)     Eating: drink diet soda or water instead of juice or soda, eat more vegetables, use fresh  or frozen vegetables, eat fruit for snacks and desserts  (07/26/2009)     Activity: take a 30 minute walk every day, park at the far end of the parking lot  (07/26/2009)    Hypertension self-management support: Written self-care plan, Education handout, Resources for patients handout  (07/26/2009)   Hypertension self-care plan printed.   Hypertension education handout printed    Lipid self-management support: Written self-care plan, Education handout, Resources for patients handout  (07/26/2009)   Lipid self-care plan printed.   Lipid education handout printed      Resource handout printed.   Nursing Instructions: Provide Hemoccult cards with instructions (see order)   Laboratory Results   Blood Tests   Date/Time Received: July 26, 2009 5:01 PM  Date/Time Reported: Lenoria Farrier  July 26, 2009 5:01 PM   HGBA1C: 5.8%   (Normal  Range: Non-Diabetic - 3-6%   Control Diabetic - 6-8%)

## 2010-03-01 NOTE — Progress Notes (Signed)
Summary: abd pain, leg weakness/ hla  Phone Note Other Incoming   Summary of Call: pt presents c/o leg weakness and upper R side occasional pain, this ongoing and has been for several months, pt states he has been caring for parents, who both are now deceased and settling the estate, so this has hindered his ability to make an appt and keep it, he now wishes to be seen, appt is scheduled 6/27 due to full daily schedules, pt is agreeable w/ this and is instructed that if the need arise he may go to urg care or ED. Initial call taken by: Freddy Finner RN,  July 21, 2009 1:52 PM  Follow-up for Phone Call        Agree, Thanks [facility] Follow-up by: Lester [place] MD,  July 21, 2009 2:09 PM

## 2010-05-06 LAB — CREATININE, SERUM
Creatinine, Ser: 1.25 mg/dL (ref 0.4–1.5)
GFR calc Af Amer: >60 mL/min (ref >60)
GFR calc non Af Amer: 59 mL/min — ABNORMAL LOW (ref >60)

## 2010-05-06 LAB — BUN: BUN: 12 mg/dL (ref 6–23)

## 2010-05-09 LAB — CREATININE, SERUM
Creatinine, Ser: 1.24 mg/dL (ref 0.4–1.5)
GFR calc Af Amer: >60 mL/min (ref >60)
GFR calc non Af Amer: 59 mL/min — ABNORMAL LOW (ref >60)

## 2010-05-09 LAB — BUN: BUN: 13 mg/dL (ref 6–23)

## 2010-06-20 ENCOUNTER — Encounter: Payer: Self-pay | Admitting: Internal Medicine

## 2010-11-04 LAB — CULTURE, ROUTINE-ABSCESS

## 2011-10-30 ENCOUNTER — Emergency Department (HOSPITAL_COMMUNITY)
Admission: EM | Admit: 2011-10-30 | Discharge: 2011-10-30 | Disposition: A | Discharge status: Home / Self Care | Payer: Medicaid Other | Attending: Emergency Medicine | Admitting: Emergency Medicine

## 2011-10-30 ENCOUNTER — Encounter (HOSPITAL_COMMUNITY): Payer: Self-pay | Admitting: Family Medicine

## 2011-10-30 DIAGNOSIS — N189 Chronic kidney disease, unspecified: Secondary | ICD-10-CM | POA: Insufficient documentation

## 2011-10-30 DIAGNOSIS — I129 Hypertensive chronic kidney disease with stage 1 through stage 4 chronic kidney disease, or unspecified chronic kidney disease: Secondary | ICD-10-CM | POA: Insufficient documentation

## 2011-10-30 DIAGNOSIS — B1920 Unspecified viral hepatitis C without hepatic coma: Secondary | ICD-10-CM | POA: Insufficient documentation

## 2011-10-30 DIAGNOSIS — E669 Obesity, unspecified: Secondary | ICD-10-CM | POA: Insufficient documentation

## 2011-10-30 DIAGNOSIS — Z87891 Personal history of nicotine dependence: Secondary | ICD-10-CM | POA: Insufficient documentation

## 2011-10-30 DIAGNOSIS — M25469 Effusion, unspecified knee: Secondary | ICD-10-CM

## 2011-10-30 DIAGNOSIS — Z825 Family history of asthma and other chronic lower respiratory diseases: Secondary | ICD-10-CM | POA: Insufficient documentation

## 2011-10-30 DIAGNOSIS — Z800 Family history of malignant neoplasm of digestive organs: Secondary | ICD-10-CM | POA: Insufficient documentation

## 2011-10-30 DIAGNOSIS — Z8249 Family history of ischemic heart disease and other diseases of the circulatory system: Secondary | ICD-10-CM | POA: Insufficient documentation

## 2011-10-30 DIAGNOSIS — Z7982 Long term (current) use of aspirin: Secondary | ICD-10-CM | POA: Insufficient documentation

## 2011-10-30 DIAGNOSIS — Z8261 Family history of arthritis: Secondary | ICD-10-CM | POA: Insufficient documentation

## 2011-10-30 LAB — CBC WITH DIFFERENTIAL/PLATELET
Basophils Absolute: 0 10*3/uL (ref 0.0–0.1)
HCT: 34.5 % — ABNORMAL LOW (ref 39.0–52.0)
Hemoglobin: 12.4 g/dL — ABNORMAL LOW (ref 13.0–17.0)
Lymphocytes Relative: 41 % (ref 12–46)
Monocytes Absolute: 1 10*3/uL (ref 0.1–1.0)
Monocytes Relative: 13 % — ABNORMAL HIGH (ref 3–12)
Neutro Abs: 3.3 10*3/uL (ref 1.7–7.7)
Neutrophils Relative %: 43 % (ref 43–77)
RBC: 4.08 MIL/uL — ABNORMAL LOW (ref 4.22–5.81)
WBC: 7.6 10*3/uL (ref 4.0–10.5)

## 2011-10-30 LAB — COMPREHENSIVE METABOLIC PANEL
AST: 84 U/L — ABNORMAL HIGH (ref 0–37)
Alkaline Phosphatase: 129 U/L — ABNORMAL HIGH (ref 39–117)
BUN: 14 mg/dL (ref 6–23)
CO2: 23 mEq/L (ref 19–32)
Chloride: 106 mEq/L (ref 96–112)
Creatinine, Ser: 1.34 mg/dL (ref 0.50–1.35)
GFR calc non Af Amer: 54 mL/min — ABNORMAL LOW (ref >90)
Potassium: 3.9 mEq/L (ref 3.5–5.1)
Total Bilirubin: 0.7 mg/dL (ref 0.3–1.2)

## 2011-10-30 MED ORDER — HYDROCHLOROTHIAZIDE 12.5 MG PO TABS: 25.0000 mg | ORAL_TABLET | Freq: Every day | ORAL | Status: DC

## 2011-10-30 MED ORDER — OXYCODONE HCL 10 MG PO TB12
10.0000 mg | ORAL_TABLET | Freq: Two times a day (BID) | ORAL | Status: DC
  Administered 2011-10-30: 10 mg via ORAL

## 2011-10-30 MED ORDER — PRENATAL COMPLETE 14-0.4 MG PO TABS: 1.0000 | ORAL_TABLET | Freq: Every day | ORAL | Status: DC

## 2011-10-30 NOTE — ED Provider Notes (Signed)
History     CSN: [id number]  Arrival date & time 10/30/11  1327   First MD Initiated Contact with Patient 10/30/11 1746      Chief Complaint  Patient presents with  . Leg Swelling    (Consider location/radiation/quality/duration/timing/severity/associated sxs/prior treatment) HPI Comments: Patient with a past medical history of hyperlipidemia, HTN, obesity, CKD, and alcohol abuse presents with bilateral leg swelling and pain for the past 2 days. The swelling started gradually and has remained constant. He has previously experienced leg swelling but it has always spontaneously resolved. He reports associated pain that feels like pressure, making his skin around his calves and feet. He denies aggravating/alleviating factors. He has a history of alcoholism and reports being sober "for a while" but recently relapsing for that past couple weeks. He is concerned that his alcohol use may have something to do with his leg swelling. He also would like a refill of blood pressure medication because he is currently between doctors. He denies visual changes, headache, chest pain, SOB, NVD, abdominal pain.    Past Medical History  Diagnosis Date  . Hyperlipidemia   . Hypertension   . Hepatitis C   . Alcohol abuse     cocaine and tobacco  . Obesity   . Syphilis, secondary     treated  . Sebaceous cyst   . Chronic kidney disease     deemed secondary to HCTZ and lisinopril    History reviewed. No pertinent past surgical history.  Family History  Problem Relation Age of Onset  . Asthma Mother   . Arthritis Mother   . Coronary artery disease Mother   . Cancer Father     pancreatic cancer  . Coronary artery disease Daughter     questionable    History  Substance Use Topics  . Smoking status: Former Smoker    Types: Cigarettes    Quit date: 05/21/2007  . Smokeless tobacco: Not on file  . Alcohol Use: No     h/o heavy alcohol use sober since 4/09      Review of Systems    Cardiovascular: Positive for leg swelling.  All other systems reviewed and are negative.    Allergies  Tylenol  Home Medications   Current Outpatient Rx  Name Route Sig Dispense Refill  . ASPIRIN 81 MG PO TABS Oral Take 81 mg by mouth daily.      . ADULT MULTIVITAMIN W/MINERALS CH Oral Take 1 tablet by mouth daily.    Marland Kitchen PRESCRIPTION MEDICATION  HCTZ 25mg  QD was last med picked up from pharmacy in 2012. Pt states he takes BP meds but is not compliant      BP 208/101  Pulse 91  Temp 97.8 F (36.6 C) (Oral)  Resp 16  SpO2 97%  Physical Exam  Nursing note and vitals reviewed. Constitutional: He is oriented to person, place, and time. He appears well-developed and well-nourished. No distress.  HENT:  Head: Normocephalic and atraumatic.  Mouth/Throat: Oropharynx is clear and moist. No oropharyngeal exudate.  Eyes: Conjunctivae normal and EOM are normal. Pupils are equal, round, and reactive to light. No scleral icterus.  Neck: Normal range of motion. Neck supple.  Cardiovascular: Normal rate and regular rhythm.  Exam reveals no gallop and no friction rub.   No murmur heard.      Lower extremity pulses difficult to palpation due to swelling. Upper extremity pulses intact.   Pulmonary/Chest: Effort normal and breath sounds normal. No respiratory distress. He has no  wheezes. He has no rales. He exhibits no tenderness.  Abdominal: Soft. He exhibits no distension. There is no tenderness. There is no rebound and no guarding.  Musculoskeletal:       Ankle ROM limited due to edema.   Neurological: He is alert and oriented to person, place, and time. Coordination normal.  Skin: Skin is warm. He is not diaphoretic.  Psychiatric: He has a normal mood and affect. His behavior is normal.    ED Course  Procedures (including critical care time)  Labs Reviewed  CBC WITH DIFFERENTIAL - Abnormal; Notable for the following:    RBC 4.08 (*)     Hemoglobin 12.4 (*)     HCT 34.5 (*)      Platelets 92 (*)  PLATELET COUNT CONFIRMED BY SMEAR   Monocytes Relative 13 (*)     All other components within normal limits  COMPREHENSIVE METABOLIC PANEL - Abnormal; Notable for the following:    Glucose, Bld 105 (*)     Albumin 3.1 (*)     AST 84 (*)     Alkaline Phosphatase 129 (*)     GFR calc non Af Amer 54 (*)     GFR calc Af Amer 63 (*)     All other components within normal limits   No results found.   1. Swelling of joint of lower leg       MDM  6:25 PM Patient's labs resulted which reveal elevated liver enzymes, likely due to alcohol abuse. Patient denies cardiac history and previous stroke. I ordered a BNP and d-dimer to rule out CHF and DVT.   8:17 PM Patient's labs unremarkable for CHF and DVT. Patient has a follow up appointment with PCP in November. I spoke with him about abstaining from alcohol, as it will continue to compromise his liver and kidneys. I will prescribe him refills for HCTZ to get him through until his follow up appointment. I have also discussed with him the importance of taking blood pressure medication daily. Patient expresses understanding. He has also requested a prescription for multivitamins (Prenatals as advised by a previous doctor for folic acid). He can be discharged and instructed to return with worsening or concerning symptoms.       Alvina Chou, [place] 10/31/11 [phone]

## 2011-10-30 NOTE — ED Notes (Signed)
Pt refuses to change into gown, denies CP/SOB

## 2011-10-30 NOTE — ED Notes (Signed)
Pt having BLE edema and pain. sts area on left foot that is black. sts is out of his BP meds and has been drinking a lot of alcohol every day. Pt hypertensive.

## 2011-10-31 NOTE — ED Provider Notes (Signed)
Medical screening examination/treatment/procedure(s) were performed by non-physician practitioner and as supervising physician I was immediately available for consultation/collaboration.   Mylinda Latina III, MD 10/31/11 [phone]

## 2011-11-09 ENCOUNTER — Emergency Department (HOSPITAL_COMMUNITY)
Admission: EM | Admit: 2011-11-09 | Discharge: 2011-11-10 | Disposition: A | Discharge status: Other Acute Inpatient Hospital | Payer: MEDICAID | Attending: Emergency Medicine | Admitting: Emergency Medicine

## 2011-11-09 ENCOUNTER — Encounter (HOSPITAL_COMMUNITY): Payer: Self-pay | Admitting: *Deleted

## 2011-11-09 ENCOUNTER — Emergency Department (HOSPITAL_COMMUNITY): Payer: MEDICAID

## 2011-11-09 DIAGNOSIS — I129 Hypertensive chronic kidney disease with stage 1 through stage 4 chronic kidney disease, or unspecified chronic kidney disease: Secondary | ICD-10-CM | POA: Insufficient documentation

## 2011-11-09 DIAGNOSIS — F1910 Other psychoactive substance abuse, uncomplicated: Secondary | ICD-10-CM | POA: Insufficient documentation

## 2011-11-09 DIAGNOSIS — N189 Chronic kidney disease, unspecified: Secondary | ICD-10-CM | POA: Insufficient documentation

## 2011-11-09 DIAGNOSIS — E785 Hyperlipidemia, unspecified: Secondary | ICD-10-CM | POA: Insufficient documentation

## 2011-11-09 DIAGNOSIS — F1010 Alcohol abuse, uncomplicated: Secondary | ICD-10-CM | POA: Insufficient documentation

## 2011-11-09 DIAGNOSIS — B1920 Unspecified viral hepatitis C without hepatic coma: Secondary | ICD-10-CM | POA: Insufficient documentation

## 2011-11-09 DIAGNOSIS — W1789XA Other fall from one level to another, initial encounter: Secondary | ICD-10-CM | POA: Insufficient documentation

## 2011-11-09 DIAGNOSIS — M79609 Pain in unspecified limb: Secondary | ICD-10-CM | POA: Insufficient documentation

## 2011-11-09 DIAGNOSIS — Z79899 Other long term (current) drug therapy: Secondary | ICD-10-CM | POA: Insufficient documentation

## 2011-11-09 DIAGNOSIS — S52123A Displaced fracture of head of unspecified radius, initial encounter for closed fracture: Secondary | ICD-10-CM | POA: Insufficient documentation

## 2011-11-09 DIAGNOSIS — Z7982 Long term (current) use of aspirin: Secondary | ICD-10-CM | POA: Insufficient documentation

## 2011-11-09 LAB — COMPREHENSIVE METABOLIC PANEL
AST: 85 U/L — ABNORMAL HIGH (ref 0–37)
BUN: 17 mg/dL (ref 6–23)
CO2: 23 mEq/L (ref 19–32)
Calcium: 8.9 mg/dL (ref 8.4–10.5)
Creatinine, Ser: 1.33 mg/dL (ref 0.50–1.35)
GFR calc Af Amer: 64 mL/min — ABNORMAL LOW (ref >90)
GFR calc non Af Amer: 55 mL/min — ABNORMAL LOW (ref >90)
Total Bilirubin: 0.6 mg/dL (ref 0.3–1.2)

## 2011-11-09 LAB — CBC
HCT: 36 % — ABNORMAL LOW (ref 39.0–52.0)
MCH: 30.3 pg (ref 26.0–34.0)
MCV: 83.9 fL (ref 78.0–100.0)
Platelets: 106 10*3/uL — ABNORMAL LOW (ref 150–400)
RBC: 4.29 MIL/uL (ref 4.22–5.81)

## 2011-11-09 LAB — RAPID URINE DRUG SCREEN, HOSP PERFORMED
Cocaine: POSITIVE — AB
Opiates: NOT DETECTED
Tetrahydrocannabinol: NOT DETECTED

## 2011-11-09 LAB — ETHANOL: Alcohol, Ethyl (B): 48 mg/dL — ABNORMAL HIGH (ref 0–11)

## 2011-11-09 LAB — ACETAMINOPHEN LEVEL: Acetaminophen (Tylenol), Serum: <15 ug/mL (ref 10–30)

## 2011-11-09 MED ORDER — ONDANSETRON HCL 8 MG PO TABS: 4.0000 mg | ORAL_TABLET | Freq: Three times a day (TID) | ORAL | Status: DC | PRN

## 2011-11-09 MED ORDER — FOLIC ACID 1 MG PO TABS
1.0000 mg | ORAL_TABLET | Freq: Every day | ORAL | Status: DC
  Administered 2011-11-10: 1 mg via ORAL
  Filled 2011-11-09: qty 1

## 2011-11-09 MED ORDER — LORAZEPAM 1 MG PO TABS: 0.0000 mg | ORAL_TABLET | Freq: Two times a day (BID) | ORAL | Status: DC

## 2011-11-09 MED ORDER — LORAZEPAM 1 MG PO TABS: 0.0000 mg | ORAL_TABLET | Freq: Four times a day (QID) | ORAL | Status: DC | PRN

## 2011-11-09 MED ORDER — VITAMIN B-1 100 MG PO TABS
100.0000 mg | ORAL_TABLET | Freq: Every day | ORAL | Status: DC
  Administered 2011-11-10: 100 mg via ORAL
  Filled 2011-11-09: qty 1

## 2011-11-09 MED ORDER — THIAMINE HCL 100 MG/ML IJ SOLN: 100.0000 mg | Freq: Every day | INTRAMUSCULAR | Status: DC

## 2011-11-09 MED ORDER — LORAZEPAM 1 MG PO TABS
1.0000 mg | ORAL_TABLET | Freq: Four times a day (QID) | ORAL | Status: DC | PRN
  Administered 2011-11-10: 1 mg via ORAL
  Filled 2011-11-09: qty 1

## 2011-11-09 MED ORDER — LORAZEPAM 1 MG PO TABS
0.0000 mg | ORAL_TABLET | Freq: Four times a day (QID) | ORAL | Status: DC
Start: 2011-11-09 — End: 2011-11-09

## 2011-11-09 MED ORDER — ALUM & MAG HYDROXIDE-SIMETH 200-200-20 MG/5ML PO SUSP: 30.0000 mL | ORAL | Status: DC | PRN

## 2011-11-09 MED ORDER — ASPIRIN 81 MG PO CHEW
81.0000 mg | CHEWABLE_TABLET | Freq: Every day | ORAL | Status: DC
  Administered 2011-11-10: 81 mg via ORAL
  Filled 2011-11-09: qty 1

## 2011-11-09 MED ORDER — NICOTINE 21 MG/24HR TD PT24
21.0000 mg | MEDICATED_PATCH | Freq: Every day | TRANSDERMAL | Status: DC
  Filled 2011-11-09: qty 1

## 2011-11-09 MED ORDER — IBUPROFEN 200 MG PO TABS
600.0000 mg | ORAL_TABLET | Freq: Three times a day (TID) | ORAL | Status: DC | PRN
  Administered 2011-11-09 – 2011-11-10 (×2): 600 mg via ORAL
  Filled 2011-11-09 (×2): qty 3

## 2011-11-09 MED ORDER — LORAZEPAM 1 MG PO TABS
0.0000 mg | ORAL_TABLET | Freq: Four times a day (QID) | ORAL | Status: DC
  Administered 2011-11-10: 2 mg via ORAL
  Filled 2011-11-09: qty 2

## 2011-11-09 MED ORDER — ADULT MULTIVITAMIN W/MINERALS CH
1.0000 | ORAL_TABLET | Freq: Every day | ORAL | Status: DC
  Administered 2011-11-10: 1 via ORAL
  Filled 2011-11-09: qty 1

## 2011-11-09 MED ORDER — LORAZEPAM 2 MG/ML IJ SOLN: 1.0000 mg | Freq: Four times a day (QID) | INTRAMUSCULAR | Status: DC | PRN

## 2011-11-09 MED ORDER — HYDROCHLOROTHIAZIDE 25 MG PO TABS
25.0000 mg | ORAL_TABLET | Freq: Every day | ORAL | Status: DC
  Administered 2011-11-10: 25 mg via ORAL
  Filled 2011-11-09: qty 1

## 2011-11-09 NOTE — ED Notes (Addendum)
Pt came in today for detox from alcohol. Pt has history of alcoholism and cocaine use (smoking) and has been treated approx 2 years ago with [facility] and sent to [facility] and [facility] Last use was Jan. 12 2012 until 2 months ago when he moved back to [place] and met up with friends and began drinking again. Pt also used cocaine last night along with drinking. Pt reports he probably usually drinks about 9-10 vodka drinks a day (maybe about 1/5 total of vodka). Last drink was this morning. Reports that he feels somewhat jittery. Pt reports last night while drinking he fell off porch approx 4 steps high and hit the ground hurting his left leg and left arm/fingers. Didn't realize until this morning that he was sore from the incident. denies SI or HI

## 2011-11-09 NOTE — ED Notes (Signed)
Patient transported to X-ray 

## 2011-11-09 NOTE — ED Notes (Signed)
Patient with history of etoh abuse present to ED with request of needing help with his problem.  Patient states that his last drink was this morning.  Patient also drank last night and fell off the stairs on his right side.  No injury noted.  Patient is alert and oriented x 4 and is ambulatory.  Patient denies SI/HI.  Patient states that he was here about two or three years ago for detox of etoh
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
## 2011-11-10 ENCOUNTER — Encounter (HOSPITAL_COMMUNITY): Payer: Self-pay

## 2011-11-10 ENCOUNTER — Inpatient Hospital Stay (HOSPITAL_COMMUNITY)
Admission: AD | Admit: 2011-11-10 | Discharge: 2011-11-22 | DRG: 897 | Disposition: A | Discharge status: Home / Self Care | Payer: 59 | Source: Ambulatory Visit | Attending: Psychiatry | Admitting: Psychiatry

## 2011-11-10 DIAGNOSIS — L0390 Cellulitis, unspecified: Secondary | ICD-10-CM

## 2011-11-10 DIAGNOSIS — F1210 Cannabis abuse, uncomplicated: Secondary | ICD-10-CM | POA: Diagnosis present

## 2011-11-10 DIAGNOSIS — D696 Thrombocytopenia, unspecified: Secondary | ICD-10-CM

## 2011-11-10 DIAGNOSIS — K769 Liver disease, unspecified: Secondary | ICD-10-CM

## 2011-11-10 DIAGNOSIS — F1410 Cocaine abuse, uncomplicated: Secondary | ICD-10-CM | POA: Diagnosis present

## 2011-11-10 DIAGNOSIS — F3289 Other specified depressive episodes: Secondary | ICD-10-CM | POA: Diagnosis present

## 2011-11-10 DIAGNOSIS — E785 Hyperlipidemia, unspecified: Secondary | ICD-10-CM | POA: Diagnosis present

## 2011-11-10 DIAGNOSIS — F1994 Other psychoactive substance use, unspecified with psychoactive substance-induced mood disorder: Principal | ICD-10-CM | POA: Diagnosis present

## 2011-11-10 DIAGNOSIS — M79609 Pain in unspecified limb: Secondary | ICD-10-CM

## 2011-11-10 DIAGNOSIS — I8500 Esophageal varices without bleeding: Secondary | ICD-10-CM

## 2011-11-10 DIAGNOSIS — Z79899 Other long term (current) drug therapy: Secondary | ICD-10-CM

## 2011-11-10 DIAGNOSIS — S5290XD Unspecified fracture of unspecified forearm, subsequent encounter for closed fracture with routine healing: Secondary | ICD-10-CM

## 2011-11-10 DIAGNOSIS — R799 Abnormal finding of blood chemistry, unspecified: Secondary | ICD-10-CM

## 2011-11-10 DIAGNOSIS — B1920 Unspecified viral hepatitis C without hepatic coma: Secondary | ICD-10-CM | POA: Diagnosis present

## 2011-11-10 DIAGNOSIS — F1010 Alcohol abuse, uncomplicated: Secondary | ICD-10-CM | POA: Diagnosis present

## 2011-11-10 DIAGNOSIS — F329 Major depressive disorder, single episode, unspecified: Secondary | ICD-10-CM | POA: Diagnosis present

## 2011-11-10 DIAGNOSIS — N189 Chronic kidney disease, unspecified: Secondary | ICD-10-CM | POA: Diagnosis present

## 2011-11-10 DIAGNOSIS — E669 Obesity, unspecified: Secondary | ICD-10-CM | POA: Diagnosis present

## 2011-11-10 DIAGNOSIS — B181 Chronic viral hepatitis B without delta-agent: Secondary | ICD-10-CM

## 2011-11-10 DIAGNOSIS — Z7982 Long term (current) use of aspirin: Secondary | ICD-10-CM

## 2011-11-10 DIAGNOSIS — Z9189 Other specified personal risk factors, not elsewhere classified: Secondary | ICD-10-CM

## 2011-11-10 DIAGNOSIS — I129 Hypertensive chronic kidney disease with stage 1 through stage 4 chronic kidney disease, or unspecified chronic kidney disease: Secondary | ICD-10-CM | POA: Diagnosis present

## 2011-11-10 DIAGNOSIS — B1710 Acute hepatitis C without hepatic coma: Secondary | ICD-10-CM

## 2011-11-10 DIAGNOSIS — I10 Essential (primary) hypertension: Secondary | ICD-10-CM

## 2011-11-10 DIAGNOSIS — F17200 Nicotine dependence, unspecified, uncomplicated: Secondary | ICD-10-CM

## 2011-11-10 LAB — CBC WITH DIFFERENTIAL/PLATELET
Eosinophils Relative: 2 % (ref 0–5)
HCT: 35.7 % — ABNORMAL LOW (ref 39.0–52.0)
Hemoglobin: 12.5 g/dL — ABNORMAL LOW (ref 13.0–17.0)
Lymphocytes Relative: 47 % — ABNORMAL HIGH (ref 12–46)
Lymphs Abs: 3.7 10*3/uL (ref 0.7–4.0)
MCV: 84.6 fL (ref 78.0–100.0)
Monocytes Absolute: 1 10*3/uL (ref 0.1–1.0)
Monocytes Relative: 13 % — ABNORMAL HIGH (ref 3–12)
Neutro Abs: 2.8 10*3/uL (ref 1.7–7.7)
RBC: 4.22 MIL/uL (ref 4.22–5.81)
RDW: 14 % (ref 11.5–15.5)
WBC: 7.7 10*3/uL (ref 4.0–10.5)

## 2011-11-10 MED ORDER — CHLORDIAZEPOXIDE HCL 25 MG PO CAPS
25.0000 mg | ORAL_CAPSULE | Freq: Every day | ORAL | Status: AC
  Administered 2011-11-15: 25 mg via ORAL
  Filled 2011-11-10: qty 1

## 2011-11-10 MED ORDER — ADULT MULTIVITAMIN W/MINERALS CH
1.0000 | ORAL_TABLET | Freq: Every day | ORAL | Status: DC
  Administered 2011-11-11 – 2011-11-22 (×12): 1 via ORAL
  Filled 2011-11-10 (×14): qty 1

## 2011-11-10 MED ORDER — CHLORDIAZEPOXIDE HCL 25 MG PO CAPS
50.0000 mg | ORAL_CAPSULE | Freq: Once | ORAL | Status: AC
  Administered 2011-11-10: 50 mg via ORAL
  Filled 2011-11-10: qty 2

## 2011-11-10 MED ORDER — LOPERAMIDE HCL 2 MG PO CAPS: 2.0000 mg | ORAL_CAPSULE | ORAL | Status: AC | PRN

## 2011-11-10 MED ORDER — CHLORDIAZEPOXIDE HCL 25 MG PO CAPS
25.0000 mg | ORAL_CAPSULE | Freq: Four times a day (QID) | ORAL | Status: AC
  Administered 2011-11-11 – 2011-11-12 (×6): 25 mg via ORAL
  Filled 2011-11-10 (×6): qty 1

## 2011-11-10 MED ORDER — CHLORDIAZEPOXIDE HCL 25 MG PO CAPS
25.0000 mg | ORAL_CAPSULE | Freq: Four times a day (QID) | ORAL | Status: AC | PRN
  Filled 2011-11-10: qty 1

## 2011-11-10 MED ORDER — AMLODIPINE BESYLATE 5 MG PO TABS
5.0000 mg | ORAL_TABLET | ORAL | Status: AC
  Administered 2011-11-10: 5 mg via ORAL
  Filled 2011-11-10: qty 1

## 2011-11-10 MED ORDER — CHLORDIAZEPOXIDE HCL 25 MG PO CAPS
25.0000 mg | ORAL_CAPSULE | Freq: Three times a day (TID) | ORAL | Status: AC
  Administered 2011-11-12 – 2011-11-13 (×2): 25 mg via ORAL
  Filled 2011-11-10 (×3): qty 1

## 2011-11-10 MED ORDER — THIAMINE HCL 100 MG/ML IJ SOLN: 100.0000 mg | Freq: Once | INTRAMUSCULAR | Status: DC

## 2011-11-10 MED ORDER — MAGNESIUM HYDROXIDE 400 MG/5ML PO SUSP: 30.0000 mL | Freq: Every day | ORAL | Status: DC | PRN

## 2011-11-10 MED ORDER — CHLORDIAZEPOXIDE HCL 25 MG PO CAPS
25.0000 mg | ORAL_CAPSULE | ORAL | Status: AC
  Administered 2011-11-13 – 2011-11-14 (×2): 25 mg via ORAL
  Filled 2011-11-10 (×2): qty 1

## 2011-11-10 MED ORDER — VITAMIN B-1 100 MG PO TABS
100.0000 mg | ORAL_TABLET | Freq: Every day | ORAL | Status: DC
  Administered 2011-11-11 – 2011-11-22 (×12): 100 mg via ORAL
  Filled 2011-11-10 (×14): qty 1

## 2011-11-10 MED ORDER — ONDANSETRON 4 MG PO TBDP: 4.0000 mg | ORAL_TABLET | Freq: Four times a day (QID) | ORAL | Status: AC | PRN

## 2011-11-10 MED ORDER — ALUM & MAG HYDROXIDE-SIMETH 200-200-20 MG/5ML PO SUSP: 30.0000 mL | ORAL | Status: DC | PRN

## 2011-11-10 MED ORDER — AMLODIPINE BESYLATE 5 MG PO TABS: 5.0000 mg | ORAL_TABLET | Freq: Every day | ORAL | Status: DC

## 2011-11-10 MED ORDER — HYDROXYZINE HCL 25 MG PO TABS: 25.0000 mg | ORAL_TABLET | Freq: Four times a day (QID) | ORAL | Status: AC | PRN

## 2011-11-10 NOTE — ED Notes (Signed)
Zenia Resides MD notified of high BP
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
## 2011-11-10 NOTE — BH Assessment (Signed)
[facility] Assessment Progress Note      Pt was accepted to Dr. Einar Grad- bed 300.02.  All support paperwork completed and faxed to appropriate parties.  Dr. Zenia Resides and nursing staff notified and agreeable with disposition.  Pt has signed all consents and is awaiting transfer.

## 2011-11-10 NOTE — Progress Notes (Signed)
Patient accepted to [facility] by Genella Mech, NP pending an available bed. Clayborne Dana, RN

## 2011-11-10 NOTE — ED Provider Notes (Signed)
Patient with increasing blood pressure. Was given Ativan as was given hydrochlorothiazide without improvement. Patient started on Norvasc 5 mg a day  Bruce Jacobsen, MD 11/10/11 1446

## 2011-11-11 DIAGNOSIS — F1410 Cocaine abuse, uncomplicated: Secondary | ICD-10-CM | POA: Diagnosis present

## 2011-11-11 MED ORDER — HYDROCHLOROTHIAZIDE 25 MG PO TABS
25.0000 mg | ORAL_TABLET | Freq: Every day | ORAL | Status: DC
  Administered 2011-11-11 – 2011-11-22 (×12): 25 mg via ORAL
  Filled 2011-11-11 (×14): qty 1

## 2011-11-11 MED ORDER — TRAZODONE HCL 50 MG PO TABS
50.0000 mg | ORAL_TABLET | Freq: Every evening | ORAL | Status: DC | PRN
  Administered 2011-11-13 – 2011-11-14 (×2): 50 mg via ORAL
  Filled 2011-11-11 (×3): qty 1

## 2011-11-11 MED ORDER — TRAMADOL HCL 50 MG PO TABS
50.0000 mg | ORAL_TABLET | Freq: Four times a day (QID) | ORAL | Status: DC | PRN
  Administered 2011-11-11 – 2011-11-21 (×19): 50 mg via ORAL
  Filled 2011-11-11 (×17): qty 1

## 2011-11-11 NOTE — Progress Notes (Signed)
D.  Took over Pt care at 2330.  Resting in bed with eyes closed, respirations even and unlabored.  No acute distress noted.  A.  Will continue to monitor.  R.  Pt remains safe

## 2011-11-11 NOTE — Progress Notes (Signed)
D.  Pt positive for evening wrap up group, see group notes.  Interacting appropriately within the milieu.  No complaints voiced other than continued right arm pain status post fracture on Wednesday.   Denies SI/HI/hallucinations at this time.  Pt states pain level does not get below a 5 with medication.  A.  Pain medication given as ordered, see MAR. Support and encouragement offered.  R.  Pt remains safe, no acute distress noted, will continue to monitor.

## 2011-11-11 NOTE — H&P (Signed)
  Pt was seen by me today and I agree with the key elements documented in H&P.  

## 2011-11-11 NOTE — Progress Notes (Signed)
Patient ID: Bruce Hayes, male   DOB: 1945/11/24, 66 y.o.   MRN: [id number]  Pt. attended and participated in aftercare planning group. Pt. Verbally accepted information on suicide prevention, warning signs to look for with suicide and crisis line numbers to use. Pt. listed their current anxiety level as 1 and stated that depression level was high due to broken arm.

## 2011-11-11 NOTE — Progress Notes (Signed)
Patient ID: Bruce Hayes, male   DOB: 09-Jun-1945, 66 y.o.   MRN: [id number]   [facility] Group Notes:  (Counselor/Nursing/MHT/Case Management/Adjunct)  11/11/2011 1:15 PM  Type of Therapy:  Group Therapy, Dance/Movement Therapy   Participation Level:  Did Not Attend   Cassidi Long 11/11/2011. 2:49 PM

## 2011-11-11 NOTE — Progress Notes (Signed)
Patient ID: Bruce Hayes, male   DOB: [birth year], 66 y.o.   MRN: [id number] He has been upaand about and to groups. Interacting with peers and staff.  He was stated on HCTZ for his b/p today. He said that sometimes he feels ok and at ather times he is down. Self inventory: denies depression, hopelessness, and SI.  W/D were c/o chilling did not request any prn.  Has not c/o any discomfort of rt arm fingers warm and normal color and he is able to move fingers.

## 2011-11-11 NOTE — [facility] Suicide Risk Assessment (Signed)
Suicide Risk Assessment  Admission Assessment     Nursing information obtained from:  Patient Demographic factors:  Male Current Mental Status:  See below Loss Factors:  Loss of significant relationship Historical Factors:  Anniversary of important loss;Family history of mental illness or substance abuse Risk Reduction Factors:  Sense of responsibility to family (sister)  CLINICAL FACTORS:   Alcohol/Substance Abuse/Dependencies  COGNITIVE FEATURES THAT CONTRIBUTE TO RISK:  Closed-mindedness    SUICIDE RISK:   Minimal: No identifiable suicidal ideation.  Patients presenting with no risk factors but with morbid ruminations; may be classified as minimal risk based on the severity of the depressive symptoms  PLAN OF CARE:  Mental Status Examination/Evaluation:  Objective: Appearance: Fairly Groomed   Eye Contact:: Good   Speech: Normal Rate   Volume: Normal   Mood: Anxious   Affect: Congruent   Thought Process: Coherent and Goal Directed   Orientation: Full   Thought Content: No avh/psychosis   Suicidal Thoughts: No   Homicidal Thoughts: No   Memory: Immediate; Good  Recent; Good  Remote; Good   Judgement: Impaired   Insight: Fair   Psychomotor Activity: sling R arm and soft cast   Concentration: Good   Recall: Good   Akathisia: No   Handed: Right   AIMS (if indicated):   Assets: Communication Skills  Desire for Improvement  Financial Resources/Insurance  Housing  Physical Health  Resilience  Social Support   Sleep: Number of Hours: 6.75    Laboratory/X-Ray  Psychological Evaluation(s)      Assessment:  AXIS I: ETOH Abuse, Cocaine abuse, cannabis abuse  AXIS II: Deferred  AXIS III:  Past Medical History   Diagnosis  Date   .  Hyperlipidemia    .  Hypertension    .  Hepatitis C    .  Alcohol abuse      cocaine and tobacco   .  Obesity    .  Syphilis, secondary      treated   .  Sebaceous cyst    .  Chronic kidney disease      deemed secondary to HCTZ  and lisinopril    AXIS IV: economic problems, housing problems, occupational problems and other psychosocial or environmental problems  AXIS V: 31-40 impairment in reality testing  Treatment Plan/Recommendations:  Treatment Plan Summary:  Daily contact with patient to assess and evaluate symptoms and progress in treatment  Medication management trazodone prn for sleep  Raiford Simmonds 11/11/2011, 4:28 PM
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
## 2011-11-11 NOTE — Progress Notes (Signed)
Psychoeducational Group Note  Date:  11/11/2011 Time:1000am  Group Topic/Focus:  Identifying Needs:   The focus of this group is to help patients identify their personal needs that have been historically problematic and identify healthy behaviors to address their needs.  Participation Level:  None  Participation Quality:  Drowsy  Affect:  Depressed  Cognitive:  Appropriate  Insight:  Limited  Engagement in Group:  None  Additional Comments:    Pricilla Larsson 11/11/2011,11:16 AM

## 2011-11-12 DIAGNOSIS — F1010 Alcohol abuse, uncomplicated: Secondary | ICD-10-CM

## 2011-11-12 DIAGNOSIS — F1210 Cannabis abuse, uncomplicated: Secondary | ICD-10-CM

## 2011-11-12 DIAGNOSIS — F1410 Cocaine abuse, uncomplicated: Secondary | ICD-10-CM

## 2011-11-12 LAB — BASIC METABOLIC PANEL
BUN: 16 mg/dL (ref 6–23)
Creatinine, Ser: 1.2 mg/dL (ref 0.50–1.35)
GFR calc Af Amer: 72 mL/min — ABNORMAL LOW (ref >90)
GFR calc non Af Amer: 62 mL/min — ABNORMAL LOW (ref >90)

## 2011-11-12 NOTE — Progress Notes (Signed)
Patient ID: ALISA APPLEYARD, male   DOB: Jan 20, 1946, 66 y.o.   MRN: [id number] 11-12-11 @ [id number] nursing adm note: D: pt has had no complaints today other than mild pain in his right arm. His has gone to groups and been visible in the milieu. A;he was given tramadol prn for the pain.  rn will continue to motivate and encourage. R: he has been receptive to care. rn will monitor and q 15 min cks continue.

## 2011-11-12 NOTE — Progress Notes (Signed)
[facility] Group Notes:  (Counselor/Nursing/MHT/Case Management/Adjunct)  11/12/2011 10:35 AM  Type of Therapy:  Psychoeducational Skills  Participation Level:  None  Participation Quality:  pt slept through entire group  Affect:  Sleepy   Cognitive:  Asleep  Insight:  pt asleep throughout group, unable to determine.   Engagement in Group:  None  Engagement in Therapy:  None  Modes of Intervention:  Activity, Education and Support  Summary of Progress/Problems:Pt attended in group where pts discussed "Ending the Cycle" from Sunday workbook. Pt slept through out group and did not participate.     Joneen Caraway 11/12/2011, 10:35 AM

## 2011-11-12 NOTE — Progress Notes (Signed)
Psychoeducational Group Note  Date:  11/12/2011 Time:  1000am  Group Topic/Focus:  Making Healthy Choices:   The focus of this group is to help patients identify negative/unhealthy choices they were using prior to admission and identify positive/healthier coping strategies to replace them upon discharge.  Participation Level:  None  Participation Quality:  Drowsy  Affect:  Appropriate  Cognitive:  Appropriate  Insight:  Limited  Engagement in Group:  None  Additional Comments:  Pt was asleep during group.   Pricilla Larsson 11/12/2011,10:13 AM

## 2011-11-12 NOTE — Progress Notes (Signed)
Patient ID: Bruce Hayes, male   DOB: [birth year], 66 y.o.   MRN: [id number]  Pt. attended and participated in aftercare planning group. Pt. verbally accepted information on suicide prevention, warning signs to look for with suicide and crisis line numbers to use. Pt. listed their current anxiety level as 2 and their current depression level as 2.

## 2011-11-12 NOTE — Progress Notes (Signed)
Patient ID: JAYCION SIPOS, male   DOB: [birth year], 66 y.o.   MRN: [id number]   [facility] Group Notes:  (Counselor/Nursing/MHT/Case Management/Adjunct)  11/12/2011 1:15 PM  Type of Therapy:  Group Therapy, Dance/Movement Therapy   Participation Level:  Minimal  Participation Quality:  Drowsy and Resistant  Affect:  Flat  Cognitive:  Appropriate  Insight:  None  Engagement in Group:  None  Engagement in Therapy:  None  Modes of Intervention:  Clarification, Problem-solving, Role-play, Socialization and Support  Summary of Progress/Problems: Therapist and group members created a list of what addiction has cost them and what they would like to say to their addiction if they could. Group members shared their personal strengths and read the poem, " I am your recovery" and discussed what they liked. Pt shared that he does not like when people just say things to say them implying that he felt that some members were not genuine. Pt was very drowsy during group and did not engage in therapy.      Cassidi Long 11/12/2011. 2:48 PM

## 2011-11-12 NOTE — Progress Notes (Signed)
D.  Pt positive for evening AA group, see group notes.  No complaints voiced.  Interacting appropriately within milieu.  A. Will continue to monitor.  R.  Pt currently resting in bed with eyes closed, respirations even and unlabored.  No distress noted.

## 2011-11-12 NOTE — Progress Notes (Signed)
[facility] Group Notes:  (Counselor/Nursing/MHT/Case Management/Adjunct)  11/11/2011 2100 Type of Therapy:  wrap up group  Participation Level:  Active  Participation Quality:  Appropriate, Drowsy and Sharing  Affect:  Appropriate  Cognitive:  Appropriate  Insight:  unknown to this Probation officer  Engagement in Group:  Limited  Engagement in Therapy:  unknown to this Geographical information systems officer of Intervention:  Clarification, Theatre stage manager of Progress/Problems: Patient stated that it felt good to finally get some rest and pt is grateful [facility].  Pt reports wanting to detox and stay clean.    Jacques Navy 11/12/2011, 1:39 AM

## 2011-11-12 NOTE — Progress Notes (Signed)
[facility] MD Progress Note  11/12/2011 5:16 PM  Diagnosis:  Assessment:  AXIS I: ETOH Abuse, Cocaine abuse, cannabis abuse  AXIS II: Deferred  AXIS III:  Past Medical History   Diagnosis  Date   .  Hyperlipidemia    .  Hypertension    .  Hepatitis C    .  Alcohol abuse      cocaine and tobacco   .  Obesity    .  Syphilis, secondary      treated   .  Sebaceous cyst    .  Chronic kidney disease      deemed secondary to HCTZ and lisinopril   AXIS IV: economic problems, housing problems, occupational problems and other psychosocial or environmental problems  AXIS V: 31-40 impairment in reality testing   ADL's:  Intact  Sleep: Good  Appetite:  Fair  Suicidal Ideation:  denies Homicidal Ideation:  denies  AEB (as evidenced by): Subjective:  States he is feeling better, denies symptoms of withdrawal, and voices no new complaints. Mental Status Examination/Evaluation: Objective:  Appearance: Fairly Groomed  Eye Contact::  Good  Speech:  Normal Rate  Volume:  Normal  Mood:  Euthymic  Affect:  Congruent  Thought Process:  Linear  Orientation:  Full  Thought Content:  WDL  Suicidal Thoughts:  No  Homicidal Thoughts:  No  Memory:  Immediate;   Fair Recent;   Fair Remote;   Fair  Judgement:  Fair  Insight:  Fair  Psychomotor Activity:  Normal  Concentration:  Fair  Recall:  Fair  Akathisia:  No  Handed:  Right  AIMS (if indicated):     Assets:  Desire for Improvement  Sleep:  Number of Hours: 6    Vital Signs:Blood pressure 152/89, pulse 96, temperature 98.6 F (37 C), temperature source Oral, resp. rate 16, height 6\' 2"  (1.88 m), weight 109.317 kg (241 lb). Current Medications: Current Facility-Administered Medications  Medication Dose Route Frequency Provider Last Rate Last Dose  . alum & mag hydroxide-simeth (MAALOX/MYLANTA) 200-200-20 MG/5ML suspension 30 mL  30 mL Oral Q4H PRN Shuvon Rankin, NP      . chlordiazePOXIDE (LIBRIUM) capsule 25 mg  25 mg Oral Q6H PRN  Shuvon Rankin, NP      . chlordiazePOXIDE (LIBRIUM) capsule 25 mg  25 mg Oral QID Shuvon Rankin, NP   25 mg at 11/12/11 1150   Followed by  . chlordiazePOXIDE (LIBRIUM) capsule 25 mg  25 mg Oral TID Shuvon Rankin, NP   25 mg at 11/12/11 1712   Followed by  . chlordiazePOXIDE (LIBRIUM) capsule 25 mg  25 mg Oral BH-qamhs Shuvon Rankin, NP       Followed by  . chlordiazePOXIDE (LIBRIUM) capsule 25 mg  25 mg Oral Daily Shuvon Rankin, NP      . hydrochlorothiazide (HYDRODIURIL) tablet 25 mg  25 mg Oral Daily Nena Polio, PA-C   25 mg at 11/12/11 0740  . hydrOXYzine (ATARAX/VISTARIL) tablet 25 mg  25 mg Oral Q6H PRN Shuvon Rankin, NP      . loperamide (IMODIUM) capsule 2-4 mg  2-4 mg Oral PRN Shuvon Rankin, NP      . magnesium hydroxide (MILK OF MAGNESIA) suspension 30 mL  30 mL Oral Daily PRN Shuvon Rankin, NP      . multivitamin with minerals tablet 1 tablet  1 tablet Oral Daily Shuvon Rankin, NP   1 tablet at 11/12/11 0739  . ondansetron (ZOFRAN-ODT) disintegrating tablet 4 mg  4 mg  Oral Q6H PRN Shuvon Rankin, NP      . thiamine (VITAMIN B-1) tablet 100 mg  100 mg Oral Daily Shuvon Rankin, NP   100 mg at 11/12/11 0739  . traMADol (ULTRAM) tablet 50 mg  50 mg Oral Q6H PRN Nena Polio, PA-C   50 mg at 11/12/11 0739  . traZODone (DESYREL) tablet 50 mg  50 mg Oral QHS PRN Raiford Simmonds, MD        Lab Results:  Results for orders placed during the hospital encounter of 11/10/11 (from the past 48 hour(s))  BASIC METABOLIC PANEL     Status: Abnormal   Collection Time   11/12/11  6:49 AM      Component Value Range Comment   Sodium 136  135 - 145 mEq/L    Potassium 3.8  3.5 - 5.1 mEq/L    Chloride 102  96 - 112 mEq/L    CO2 25  19 - 32 mEq/L    Glucose, Bld 102 (*) 70 - 99 mg/dL    BUN 16  6 - 23 mg/dL    Creatinine, Ser 1.20  0.50 - 1.35 mg/dL    Calcium 8.9  8.4 - 10.5 mg/dL    GFR calc non Af Amer 62 (*) >90 mL/min    GFR calc Af Amer 72 (*) >90 mL/min     Physical Findings: AIMS:  , ,   ,  ,    CIWA:  CIWA-Ar Total: 0  COWS:     Treatment Plan Summary: Daily contact with patient to assess and evaluate symptoms and progress in treatment Medication management  Plan: 1. Continue current plan of care as written at this time.  2. Patient to meet with treatment team in AM to discuss options for follow up upon completion of detox. Marlane Hatcher. Jordain Radin PAC 11/12/2011, 5:16 PM

## 2011-11-13 DIAGNOSIS — F1020 Alcohol dependence, uncomplicated: Secondary | ICD-10-CM

## 2011-11-13 MED ORDER — ENSURE COMPLETE PO LIQD
237.0000 mL | Freq: Every day | ORAL | Status: DC
  Administered 2011-11-14 – 2011-11-22 (×8): 237 mL via ORAL

## 2011-11-13 NOTE — ED Provider Notes (Signed)
Medical screening examination/treatment/procedure(s) were performed by non-physician practitioner and as supervising physician I was immediately available for consultation/collaboration.   Sharyon Cable, MD 11/13/11 [phone]

## 2011-11-13 NOTE — Progress Notes (Signed)
[facility] Group Notes:  (Counselor/Nursing/MHT/Case Management/Adjunct)  11/13/2011 2:31 PM  Type of Therapy:  Group Therapy 1:15 - 2:30  Participation Level:  Did Not Attend   Bruce Hayes 11/13/2011, 2:31 PM

## 2011-11-13 NOTE — Progress Notes (Signed)
D patient slept fair last nite, arm is giving him some trouble at nite, appetite is improving, energy level is low and ability to pay attention is good, did not state depression or hopelessness on this inventory sheet, denies Si or Hi, c/o chilling, lightheaded. A eating meals in the DR, taking meds as ordered by MD, attending group, fell asleep during 1315 group this afternoon and had to leave group, has been sedated R patient remains safe on the unit

## 2011-11-13 NOTE — Progress Notes (Signed)
Patient ID: Bruce Hayes, male   DOB: 28-Oct-1945, 66 y.o.   MRN: [id number] D: Pt. Reports "I want placement to long term tx, want to go straight there." Pt. Has right arm in cast, immobilized with a sling. A: Writer noted edema of fingers, but pt. Able to move freely and capillary refill <2. Staff will monitor q13min for safety. Pt. Encouraged to attend group.  R: Pt. Is safe on the unit. Pt. Attended group.
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
## 2011-11-13 NOTE — Progress Notes (Signed)
Patient did attend the evening speaker AA meeting.  

## 2011-11-13 NOTE — Tx Team (Signed)
Interdisciplinary Treatment Plan Update (Adult)  Date:  11/13/2011  Time Reviewed:  10:21 AM   Progress in Treatment: Attending groups: Yes Participating in groups:  Yes Taking medication as prescribed: Yes Tolerating medication:  Yes Family/Significant othe contact made:  Counselor assessing for appropriate contact Patient understands diagnosis:  Yes Discussing patient identified problems/goals with staff:  Yes Medical problems stabilized or resolved:  Yes Denies suicidal/homicidal ideation: Yes Issues/concerns per patient self-inventory:  None identified Other: N/A  New problem(s) identified: None Identified  Reason for Continuation of Hospitalization: Anxiety Depression Medication stabilization Withdrawal symptoms  Interventions implemented related to continuation of hospitalization: mood stabilization, medication monitoring and adjustment, group therapy and psycho education, safety checks q 15 mins  Additional comments: N/A  Estimated length of stay: 3-5 days  Discharge Plan: SW will assess for appropriate referrals.    New goal(s): N/A  Review of initial/current patient goals per problem list:    1.  Goal(s): Address substance use  Met:  No  Target date: by discharge  As evidenced by: completing detox protocol and refer to appropriate treatment   Attendees: Patient:   11/13/2011 9:39 AM   Family:     Physician:  Elvin So, MD 11/13/2011 9:39 AM   Nursing: Bennye Alm, RN 11/13/2011 9:39 AM   Case Manager:  Regan Lemming, [place] 11/13/2011 9:39 AM   Counselor:  Frutoso Chase, [place] 11/13/2011 9:39 AM   Other:  Kathi Der, RN 11/13/2011 9:40 AM   Other:  Satira Sark, RN 11/13/2011 9:39 AM   Other:  Marga Hoots, RN 11/13/2011 9:40 AM   Other:      Scribe for Treatment Team:   Regan Lemming 11/13/2011 10:21 AM
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
## 2011-11-13 NOTE — Progress Notes (Signed)
Patient attended MHT group. Writer used therapeutic ball to engage patient is social activities with peers

## 2011-11-13 NOTE — Progress Notes (Signed)
Psychoeducational Group Note  Date:  11/12/11 Time:  1515  Group Topic/Focus:  Conflict Resolution:   The focus of this group is to discuss the conflict resolution process and how it may be used upon discharge.  Participation Level:  Active  Participation Quality:  Appropriate  Affect:  Appropriate  Cognitive:  Appropriate  Insight:  Good  Engagement in Group:  Good  Additional Comments:  none  Montee Tallman, [name] 11/13/2011, 1:27 PM

## 2011-11-13 NOTE — Progress Notes (Signed)
Psychoeducational Group Note  Date:  11/13/2011 Time:  1100  Group Topic/Focus:  Wellness Toolbox:   The focus of this group is to discuss various aspects of wellness, balancing those aspects and exploring ways to increase the ability to experience wellness.  Patients will create a wellness toolbox for use upon discharge.  Participation Level:  Active  Participation Quality:  Appropriate  Affect:  Appropriate  Cognitive:  Appropriate  Insight:  Good  Engagement in Group:  Good  Additional Comments:  none  Bruce Hayes, [name] 11/13/2011, 1:23 PM

## 2011-11-13 NOTE — Progress Notes (Signed)
[facility] MD Progress Note  11/13/2011 1:38 PM S: Patient lying in bed, states feeling drowsy from his medications. Denies SI/HI.  Diagnosis:   Axis I: Alcohol Abuse Axis II: No diagnosis Axis III:  Past Medical History  Diagnosis Date  . Hyperlipidemia   . Hypertension   . Hepatitis C   . Alcohol abuse     cocaine and tobacco  . Obesity   . Syphilis, secondary     treated  . Sebaceous cyst   . Chronic kidney disease     deemed secondary to HCTZ and lisinopril   Axis IV: occupational problems Axis V: 51-60 moderate symptoms  ADL's:  Intact  Sleep: Fair  Appetite:  Fair   Mental Status Examination/Evaluation: Objective:  Appearance: Casual  Eye Contact::  Minimal  Speech:  Clear and Coherent  Volume:  Normal  Mood:  Dysphoric  Affect:  Blunt  Thought Process:  Coherent  Orientation:  Full  Thought Content:  WDL  Suicidal Thoughts:  No  Homicidal Thoughts:  No  Memory:  Immediate;   Fair Recent;   Fair Remote;   Fair  Judgement:  Fair  Insight:  Fair  Psychomotor Activity:  Normal  Concentration:  Fair  Recall:  Fair  Akathisia:  No  Handed:  Right  AIMS (if indicated):     Assets:  Desire for Improvement  Sleep:  Number of Hours: 5    Vital Signs:Blood pressure 123/77, pulse 109, temperature 97.6 F (36.4 C), temperature source Oral, resp. rate 24, height 6\' 2"  (1.88 m), weight 109.317 kg (241 lb). Current Medications: Current Facility-Administered Medications  Medication Dose Route Frequency Provider Last Rate Last Dose  . alum & mag hydroxide-simeth (MAALOX/MYLANTA) 200-200-20 MG/5ML suspension 30 mL  30 mL Oral Q4H PRN Shuvon Rankin, NP      . chlordiazePOXIDE (LIBRIUM) capsule 25 mg  25 mg Oral Q6H PRN Shuvon Rankin, NP      . chlordiazePOXIDE (LIBRIUM) capsule 25 mg  25 mg Oral TID Shuvon Rankin, NP   25 mg at 11/13/11 [id number]   Followed by  . chlordiazePOXIDE (LIBRIUM) capsule 25 mg  25 mg Oral BH-qamhs Shuvon Rankin, NP       Followed by  .  chlordiazePOXIDE (LIBRIUM) capsule 25 mg  25 mg Oral Daily Shuvon Rankin, NP      . hydrochlorothiazide (HYDRODIURIL) tablet 25 mg  25 mg Oral Daily Nena Polio, PA-C   25 mg at 11/13/11 [id number]  . hydrOXYzine (ATARAX/VISTARIL) tablet 25 mg  25 mg Oral Q6H PRN Shuvon Rankin, NP      . loperamide (IMODIUM) capsule 2-4 mg  2-4 mg Oral PRN Shuvon Rankin, NP      . magnesium hydroxide (MILK OF MAGNESIA) suspension 30 mL  30 mL Oral Daily PRN Shuvon Rankin, NP      . multivitamin with minerals tablet 1 tablet  1 tablet Oral Daily Shuvon Rankin, NP   1 tablet at 11/13/11 0817  . ondansetron (ZOFRAN-ODT) disintegrating tablet 4 mg  4 mg Oral Q6H PRN Shuvon Rankin, NP      . thiamine (VITAMIN B-1) tablet 100 mg  100 mg Oral Daily Shuvon Rankin, NP   100 mg at 11/13/11 0817  . traMADol (ULTRAM) tablet 50 mg  50 mg Oral Q6H PRN Nena Polio, PA-C   50 mg at 11/12/11 0739  . traZODone (DESYREL) tablet 50 mg  50 mg Oral QHS PRN Raiford Simmonds, MD        Lab Results:  Results for orders placed during the hospital encounter of 11/10/11 (from the past 48 hour(s))  BASIC METABOLIC PANEL     Status: Abnormal   Collection Time   11/12/11  6:49 AM      Component Value Range Comment   Sodium 136  135 - 145 mEq/L    Potassium 3.8  3.5 - 5.1 mEq/L    Chloride 102  96 - 112 mEq/L    CO2 25  19 - 32 mEq/L    Glucose, Bld 102 (*) 70 - 99 mg/dL    BUN 16  6 - 23 mg/dL    Creatinine, Ser 1.20  0.50 - 1.35 mg/dL    Calcium 8.9  8.4 - 10.5 mg/dL    GFR calc non Af Amer 62 (*) >90 mL/min    GFR calc Af Amer 72 (*) >90 mL/min     Physical Findings: AIMS:  , ,  ,  ,    CIWA:  CIWA-Ar Total: 1  COWS:     Treatment Plan Summary: Daily contact with patient to assess and evaluate symptoms and progress in treatment Medication management  Plan: Continue detox protocol.  Alyha Marines 11/13/2011, 1:38 PM

## 2011-11-14 NOTE — Progress Notes (Signed)
Pt attended discharge planning group and actively participated in group.  SW provided pt with today's workbook.  Pt presents with flat affect and depressed mood.  Pt rates depression and anxiety at a 10 today.  Pt denies SI/HI today.  Pt states that his drinking has gotten worse and has to do something about.  Pt is eager to go to [facility] but a bed is not available until 10/29.  Pt is open to going to [facility] first, stating he has been there 2 years ago.  SW called for a bed today but no bed is available today.  SW will continue to assess for appropriate referrals.  No further needs voiced by pt at this time.    Regan Lemming, [place] 11/14/2011  9:51 AM

## 2011-11-14 NOTE — [facility] Counselor (Signed)
Counselor conducted suicide prevention education with sister [name] ph [phone]. Counselor discussed suicide risk factors, suicide prevention interventions, and phone numbers- 911, [place], and 1-800-Suicide. Pt.'s sister indicated her concern and willingness to continue providing emotional support for the Pt. She indicated that she spoke with Pt. Yesterday and to expect phone call from the counselor to discuss reasons for hospitalization. Carron Curie, LPC.

## 2011-11-14 NOTE — Progress Notes (Signed)
[facility] MD Progress Note  11/14/2011 11:43 AM S: Patient seen. Reports doing better, waiting for bed at [facility].  Diagnosis:   Axis I: Alcohol Abuse Axis II: No diagnosis Axis III:  Past Medical History  Diagnosis Date  . Hyperlipidemia   . Hypertension   . Hepatitis C   . Alcohol abuse     cocaine and tobacco  . Obesity   . Syphilis, secondary     treated  . Sebaceous cyst   . Chronic kidney disease     deemed secondary to HCTZ and lisinopril   Axis IV: housing problems and occupational problems Axis V: 51-60 moderate symptoms  ADL's:  Intact  Sleep: Fair  Appetite:  Fair   Mental Status Examination/Evaluation: Objective:  Appearance: Casual  Eye Contact::  Fair  Speech:  Clear and Coherent  Volume:  Normal  Mood:  Dysphoric  Affect:  Congruent  Thought Process:  Coherent  Orientation:  Full  Thought Content:  WDL  Suicidal Thoughts:  No  Homicidal Thoughts:  No  Memory:  Immediate;   Fair Recent;   Fair Remote;   Fair  Judgement:  Fair  Insight:  Fair  Psychomotor Activity:  Normal  Concentration:  Fair  Recall:  Fair  Akathisia:  No  Handed:  Right  AIMS (if indicated):     Assets:  Desire for Improvement  Sleep:  Number of Hours: 6    Vital Signs:Blood pressure 140/88, pulse 101, temperature 98.3 F (36.8 C), temperature source Oral, resp. rate 16, height 6\' 2"  (1.88 m), weight 109.317 kg (241 lb). Current Medications: Current Facility-Administered Medications  Medication Dose Route Frequency Provider Last Rate Last Dose  . alum & mag hydroxide-simeth (MAALOX/MYLANTA) 200-200-20 MG/5ML suspension 30 mL  30 mL Oral Q4H PRN Shuvon Rankin, NP      . chlordiazePOXIDE (LIBRIUM) capsule 25 mg  25 mg Oral Q6H PRN Shuvon Rankin, NP      . chlordiazePOXIDE (LIBRIUM) capsule 25 mg  25 mg Oral TID Shuvon Rankin, NP   25 mg at 11/13/11 [id number]   Followed by  . chlordiazePOXIDE (LIBRIUM) capsule 25 mg  25 mg Oral BH-qamhs Shuvon Rankin, NP   25 mg at 11/14/11 0820   Followed by  . chlordiazePOXIDE (LIBRIUM) capsule 25 mg  25 mg Oral Daily Shuvon Rankin, NP      . feeding supplement (ENSURE COMPLETE) liquid 237 mL  237 mL Oral Q breakfast Linwood Dibbles, RD   237 mL at 11/14/11 [id number]  . hydrochlorothiazide (HYDRODIURIL) tablet 25 mg  25 mg Oral Daily Nena Polio, PA-C   25 mg at 11/14/11 0820  . hydrOXYzine (ATARAX/VISTARIL) tablet 25 mg  25 mg Oral Q6H PRN Shuvon Rankin, NP      . loperamide (IMODIUM) capsule 2-4 mg  2-4 mg Oral PRN Shuvon Rankin, NP      . magnesium hydroxide (MILK OF MAGNESIA) suspension 30 mL  30 mL Oral Daily PRN Shuvon Rankin, NP      . multivitamin with minerals tablet 1 tablet  1 tablet Oral Daily Shuvon Rankin, NP   1 tablet at 11/14/11 0820  . ondansetron (ZOFRAN-ODT) disintegrating tablet 4 mg  4 mg Oral Q6H PRN Shuvon Rankin, NP      . thiamine (VITAMIN B-1) tablet 100 mg  100 mg Oral Daily Shuvon Rankin, NP   100 mg at 11/14/11 0820  . traMADol (ULTRAM) tablet 50 mg  50 mg Oral Q6H PRN Nena Polio, PA-C   50 mg  at 11/13/11 2100  . traZODone (DESYREL) tablet 50 mg  50 mg Oral QHS PRN Raiford Simmonds, MD   50 mg at 11/13/11 2136    Lab Results: No results found for this or any previous visit (from the past 48 hour(s)).  Physical Findings: AIMS: Facial and Oral Movements Muscles of Facial Expression: None, normal Lips and Perioral Area: None, normal Jaw: None, normal Tongue: None, normal,Extremity Movements Upper (arms, wrists, hands, fingers): None, normal Lower (legs, knees, ankles, toes): None, normal, Trunk Movements Neck, shoulders, hips: None, normal, Overall Severity Severity of abnormal movements (highest score from questions above): None, normal Incapacitation due to abnormal movements: None, normal Patient's awareness of abnormal movements (rate only patient's report): No Awareness, Dental Status Current problems with teeth and/or dentures?: No Does patient usually wear dentures?: No  CIWA:  CIWA-Ar Total: 1    COWS:  COWS Total Score: 1   Treatment Plan Summary: Daily contact with patient to assess and evaluate symptoms and progress in treatment Medication management  Plan: Continue current plan of care.  Abdel Effinger 11/14/2011, 11:43 AM

## 2011-11-14 NOTE — Progress Notes (Signed)
Psychoeducational Group Note  Date:  11/14/2011 Time:  1000  Group Topic/Focus:  Recovery Goals:   The focus of this group is to identify appropriate goals for recovery and establish a plan to achieve them.  Wisdom Activity Cards  Participation Level:  Active  Participation Quality:  Appropriate, Attentive and Sharing  Affect:  Appropriate  Cognitive:  Appropriate  Insight:  Good  Engagement in Group:  Good  Additional Comments:  Pt was given random two cards of Wisdom cards. Pt was given the opportunity to express ho cards chosen impact their life, and how it can become more into their life, and how recovery is apart of the wisdom cards in making choices that are healthy for pt life. Cards pt chose were identity change, pt stated his drinking was something that he knows he need further treatment with relapsing, states "things happened in his life and he began to drink again after being sober for 19 months." Pt also received a card that stated notice a special person.   Bruce Hayes Brittini 11/14/2011, 12:04 PM

## 2011-11-14 NOTE — Progress Notes (Signed)
D:  Patient up and attending groups.  Active in the unit milieu.  Affect flat.  Tolerating medications well.  Sling intact to arm.   A:  Medications given as per orders.  Reminded patient to let staff know if pain starts escalating and reminded him to take the pain medicine before pain was out of control.   R:  Pleasant and cooperative.  Interacting well with staff and peers.  Active in programming.

## 2011-11-15 MED ORDER — TRAZODONE HCL 50 MG PO TABS
25.0000 mg | ORAL_TABLET | Freq: Every evening | ORAL | Status: DC | PRN
  Administered 2011-11-15 – 2011-11-21 (×6): 25 mg via ORAL
  Filled 2011-11-15: qty 1
  Filled 2011-11-15 (×9): qty 0.5
  Filled 2011-11-15: qty 1
  Filled 2011-11-15 (×2): qty 0.5
  Filled 2011-11-15: qty 1
  Filled 2011-11-15 (×5): qty 0.5

## 2011-11-15 NOTE — Progress Notes (Signed)
Pt did not attend d/c planning group on this date.  SW met with pt individually at this time.  Pt presents with flat affect and depressed mood.  Pt is still wanting to go straight to treatment from here.  SW called for [facility] but no beds were available today.  SW will continue to call daily.  No further needs voiced by pt at this time.    Regan Lemming, [place] 11/15/2011  2:27 PM

## 2011-11-15 NOTE — Progress Notes (Signed)
Patient ID: BARUCH BEHRMAN, male   DOB: [birth year], 66 y.o.   MRN: [id number] [facility] MD Progress Note  11/15/2011 3:24 PM S: Patient seen. Patient reports that he is doing better waiting on report or information from [facility] or [facility].  States that he would like to go somewhere, where he could receive long term treatment.  States that he was clean for 19 months then relapsed.  Diagnosis:   Axis I: Alcohol Abuse Axis II: No diagnosis Axis III:  Past Medical History  Diagnosis Date  . Hyperlipidemia   . Hypertension   . Hepatitis C   . Alcohol abuse     cocaine and tobacco  . Obesity   . Syphilis, secondary     treated  . Sebaceous cyst   . Chronic kidney disease     deemed secondary to HCTZ and lisinopril   Axis IV: housing problems and occupational problems Axis V: 51-60 moderate symptoms  ADL's:  Intact  Sleep: Fair.   Patient states that he feels that the medication he is getting at night is to much.  States that he is having a restless sleep and still feeling the lingering effects in the am which is making him feel tired.  Appetite:  Fair  Patient states that he is eating little bit more, but thinks it is just that to his taste the food is just bland not that he doesn't have an appetite.   Mental Status Examination/Evaluation: Objective:  Appearance: Casual  Eye Contact::  Good  Speech:  Clear and Coherent  Volume:  Normal  Mood:  Dysphoric  Affect:  Congruent  Thought Process:  Coherent  Orientation:  Full  Thought Content:  WDL  Suicidal Thoughts:  No  Homicidal Thoughts:  No  Memory: Immediate; Good, Recent; Good, Remote; Good     Insight:  Fair  Psychomotor Activity:  Normal  Concentration:  Fair  Recall:  Good  Akathisia:  No  Handed:  Right  AIMS (if indicated):     Assets:  Desire for Improvement  Sleep:  Number of Hours: 6.75   Restless sleep.     Vital Signs: 11/15/11 0700 Temp. 98.7 F (37.1 C) Pulse; 98  B/P;  12 129/77 mmHg Body mass index is  30.94 kg/(m^2).  Current Medications: Current Facility-Administered Medications  Medication Dose Route Frequency Provider Last Rate Last Dose  . alum & mag hydroxide-simeth (MAALOX/MYLANTA) 200-200-20 MG/5ML suspension 30 mL  30 mL Oral Q4H PRN Kenora Spayd, NP      . chlordiazePOXIDE (LIBRIUM) capsule 25 mg  25 mg Oral Daily Blayre Papania, NP   25 mg at 11/15/11 0804  . feeding supplement (ENSURE COMPLETE) liquid 237 mL  237 mL Oral Q breakfast Linwood Dibbles, RD   237 mL at 11/15/11 1016  . hydrochlorothiazide (HYDRODIURIL) tablet 25 mg  25 mg Oral Daily Nena Polio, PA-C   25 mg at 11/15/11 0804  . magnesium hydroxide (MILK OF MAGNESIA) suspension 30 mL  30 mL Oral Daily PRN Courtney Fenlon, NP      . multivitamin with minerals tablet 1 tablet  1 tablet Oral Daily Nohely Whitehorn, NP   1 tablet at 11/15/11 0803  . thiamine (VITAMIN B-1) tablet 100 mg  100 mg Oral Daily Shray Hunley, NP   100 mg at 11/15/11 0803  . traMADol (ULTRAM) tablet 50 mg  50 mg Oral Q6H PRN Nena Polio, PA-C   50 mg at 11/15/11 0803  . traZODone (DESYREL) tablet 50 mg  50 mg Oral QHS PRN Raiford Simmonds, MD   50 mg at 11/14/11 2117    Lab Results: No results found for this or any previous visit (from the past 48 hour(s)).  Physical Findings: AIMS: Facial and Oral Movements Muscles of Facial Expression: None, normal Lips and Perioral Area: None, normal Jaw: None, normal Tongue: None, normal,Extremity Movements Upper (arms, wrists, hands, fingers): None, normal Lower (legs, knees, ankles, toes): None, normal, Trunk Movements Neck, shoulders, hips: None, normal, Overall Severity Severity of abnormal movements (highest score from questions above): None, normal Incapacitation due to abnormal movements: None, normal Patient's awareness of abnormal movements (rate only patient's report): No Awareness, Dental Status Current problems with teeth and/or dentures?: No Does patient usually wear dentures?: No  CIWA:   CIWA-Ar Total: 0  COWS:  COWS Total Score: 1   Treatment Plan Summary: Daily contact with patient to assess and evaluate symptoms and progress in treatment Medication management  Plan:   1. Continue current plan of care as written at this time.  2. Patient to meet with treatment team in to discuss options for follow up upon completion of detox and                              information for [facility] or [facility].  3. Decrease Trazodone to 25 mg Q HS repeat x1  Obe Ahlers 11/15/2011, 3:24 PM

## 2011-11-15 NOTE — Progress Notes (Signed)
Group Note  Date:  11/15/2011 Time:  1:15  Group Topic/Focus:  Emotion Regulation  Participation Level:  None  Participation Quality:  Drowsy  Affect:  n/a  Cognitive:  n/a  Insight:  n/a  Engagement in Group:  n/a  Additional Comments:  Bruce Hayes slept during group.  [name] S 11/15/2011, 2:19 PM

## 2011-11-15 NOTE — Tx Team (Signed)
Interdisciplinary Treatment Plan Update (Adult)  Date:  11/15/2011  Time Reviewed:  9:58 AM   Progress in Treatment: Attending groups: Yes Participating in groups:  Yes Taking medication as prescribed: Yes Tolerating medication:  Yes Family/Significant othe contact made:   Patient understands diagnosis:  Yes Discussing patient identified problems/goals with staff:  Yes Medical problems stabilized or resolved:  Yes Denies suicidal/homicidal ideation: Yes Issues/concerns per patient self-inventory:  None identified Other: N/A  New problem(s) identified: None Identified  Reason for Continuation of Hospitalization: Anxiety Depression Medication Stabilization   Interventions implemented related to continuation of hospitalization: mood stabilization, medication monitoring and adjustment, group therapy and psycho education, safety checks q 15 mins  Additional comments: N/A  Estimated length of stay: 2-3 days  Discharge Plan: Pt wants to go to long term treatment, SW will call for [facility] bed daily.    New goal(s): N/A  Review of initial/current patient goals per problem list:    1.  Goal(s): Address substance use  Met:  No  Target date: by discharge  As evidenced by: completing detox protocol and refer to appropriate treatment  2.  Goal (s): Reduce depressive symptoms  Met:  No  Target date: by discharge  As evidenced by: Reducing depression from a 10 to a 3 as reported by pt.     Attendees:  Patient:    Family:    Physician: Elvin So, MD  11/15/2011 9:51 AM   Nursing: Satira Sark, RN  11/15/2011 9:51 AM   Case Manager: Regan Lemming, [place]  11/15/2011 9:51 AM   Counselor: Frutoso Chase, [place]  11/15/2011 9:51 AM   Other: Lona Millard, RN  11/15/2011 9:53 AM   Other: Kathi Der, RN  11/15/2011 9:54 AM   Other: Marga Hoots, RN  11/15/2011 9:53 AM   Other: Chucky May, NP       Scribe for Treatment Team:   Regan Lemming 11/15/2011 9:58 AM

## 2011-11-15 NOTE — Progress Notes (Signed)
Pt observed in his room in bed.  He responds when his name is called.  He reports he is tired/sleepy and does not feel like going to group.  He says he has attended groups during the day.  He reports his R arm is sore, but he wants to wait until closer to bedtime to take any pain medication.  Wrap on arm is secure and arm is in a sling.  Pt reports minimal withdrawal symptoms.  He denies SI/HI/AV.  He is waiting for a bed at [facility].  Pt makes his needs known to staff.  Safety maintained with q15 minute checks.

## 2011-11-15 NOTE — Progress Notes (Signed)
D:  Patient has been in bed most of the day, stating that he doesn't feel well.  Tramadol given for pain in arm and it was effective.  He states that he is not having depression or hopelessness at this time. Denies SI/HI/AVH at this time. He did not attend group this morning, but got up and attended group in the afternoon. A:  Medications given as ordered, emotional support provided.  Safety checks q 15 minutes. R:  Safety maintained on unit.

## 2011-11-16 DIAGNOSIS — F329 Major depressive disorder, single episode, unspecified: Secondary | ICD-10-CM

## 2011-11-16 MED ORDER — FLUOXETINE HCL 10 MG PO CAPS
10.0000 mg | ORAL_CAPSULE | Freq: Every day | ORAL | Status: DC
  Administered 2011-11-16 – 2011-11-17 (×2): 10 mg via ORAL
  Filled 2011-11-16 (×2): qty 1

## 2011-11-16 NOTE — Progress Notes (Signed)
D: Patient reported feeling better this evening at the beginning of this shift. He stated that he went to doctor because of his swollen arm today. Stated doctor said the sling was too tight. His mood and affect still flat and depressed. Patient denied SI/HI, denied Hallucinations and denied withdrawals. Patient stated that he would call [facility] tomorrow and see if they would accept him sometimes next week. A: Writer encouraged and supported patient. R: Patient receptive to encouragement. Q 15 minute check continues to maintain safety.

## 2011-11-16 NOTE — Progress Notes (Signed)
[facility] Group Notes:  (Counselor/Nursing/MHT/Case Management/Adjunct)  11/16/2011 2:45 PM  Type of Therapy:  Group Therapy  Participation Level:  None  Participation Quality:  None  Affect:  Slept during group  Cognitive:  Appropriate  Insight:  None  Engagement in Group:  None  Engagement in Therapy:  None  Modes of Intervention:  Problem-solving  Summary of Progress/Problems: Pt. Attended group, but appeared drowsy and slept entire group.   Blanche East 11/16/2011, 2:45 PM

## 2011-11-16 NOTE — Progress Notes (Signed)
[facility] MD Progress Note  11/16/2011 1:46 PM  S: Patient with flat affect, depressed mood. Complaining of arm pain.  Diagnosis:   Axis I: Alcohol Abuse and Depressive Disorder NOS Axis II: No diagnosis Axis III:  Past Medical History  Diagnosis Date  . Hyperlipidemia   . Hypertension   . Hepatitis C   . Alcohol abuse     cocaine and tobacco  . Obesity   . Syphilis, secondary     treated  . Sebaceous cyst   . Chronic kidney disease     deemed secondary to HCTZ and lisinopril   Axis IV: economic problems, housing problems and occupational problems Axis V: 51-60 moderate symptoms  ADL's:  Intact  Sleep: Fair  Appetite:  Fair   Mental Status Examination/Evaluation: Objective:  Appearance: Casual  Eye Contact::  Fair  Speech:  Slow  Volume:  Decreased  Mood:  Depressed  Affect:  Blunt  Thought Process:  Coherent  Orientation:  Full  Thought Content:  WDL  Suicidal Thoughts:  No  Homicidal Thoughts:  No  Memory:  Immediate;   Fair Recent;   Fair Remote;   Fair  Judgement:  Fair  Insight:  Fair  Psychomotor Activity:  Normal  Concentration:  Fair  Recall:  Fair  Akathisia:  No  Handed:  Right  AIMS (if indicated):     Assets:  Desire for Improvement  Sleep:  Number of Hours: 6.25    Vital Signs:Blood pressure 145/81, pulse 98, temperature 97.7 F (36.5 C), temperature source Oral, resp. rate 18, height 6\' 2"  (1.88 m), weight 109.317 kg (241 lb). Current Medications: Current Facility-Administered Medications  Medication Dose Route Frequency Provider Last Rate Last Dose  . alum & mag hydroxide-simeth (MAALOX/MYLANTA) 200-200-20 MG/5ML suspension 30 mL  30 mL Oral Q4H PRN Shuvon Rankin, NP      . feeding supplement (ENSURE COMPLETE) liquid 237 mL  237 mL Oral Q breakfast Linwood Dibbles, RD   237 mL at 11/16/11 0929  . hydrochlorothiazide (HYDRODIURIL) tablet 25 mg  25 mg Oral Daily Nena Polio, PA-C   25 mg at 11/16/11 0929  . magnesium hydroxide (MILK OF  MAGNESIA) suspension 30 mL  30 mL Oral Daily PRN Shuvon Rankin, NP      . multivitamin with minerals tablet 1 tablet  1 tablet Oral Daily Shuvon Rankin, NP   1 tablet at 11/16/11 0930  . thiamine (VITAMIN B-1) tablet 100 mg  100 mg Oral Daily Shuvon Rankin, NP   100 mg at 11/16/11 0930  . traMADol (ULTRAM) tablet 50 mg  50 mg Oral Q6H PRN Nena Polio, PA-C   50 mg at 11/15/11 0803  . traZODone (DESYREL) tablet 25 mg  25 mg Oral QHS,MR X 1 Shuvon Rankin, NP   25 mg at 11/15/11 2202  . DISCONTD: traZODone (DESYREL) tablet 50 mg  50 mg Oral QHS PRN Raiford Simmonds, MD   50 mg at 11/14/11 2117    Lab Results: No results found for this or any previous visit (from the past 48 hour(s)).  Physical Findings: AIMS: Facial and Oral Movements Muscles of Facial Expression: None, normal Lips and Perioral Area: None, normal Jaw: None, normal Tongue: None, normal,Extremity Movements Upper (arms, wrists, hands, fingers): None, normal Lower (legs, knees, ankles, toes): None, normal, Trunk Movements Neck, shoulders, hips: None, normal, Overall Severity Severity of abnormal movements (highest score from questions above): None, normal Incapacitation due to abnormal movements: None, normal Patient's awareness of abnormal movements (rate only  patient's report): No Awareness, Dental Status Current problems with teeth and/or dentures?: No Does patient usually wear dentures?: No  CIWA:  CIWA-Ar Total: 0  COWS:  COWS Total Score: 1   Treatment Plan Summary: Daily contact with patient to assess and evaluate symptoms and progress in treatment Medication management  Plan: Start Prozac at 10mg  po qd. Encourage patient to attend groups. Plan for discharge.   Randol Zumstein 11/16/2011, 1:46 PM

## 2011-11-16 NOTE — Progress Notes (Signed)
Pt attended discharge planning group and actively participated in group.  SW provided pt with today's workbook.  Pt presents with flat affect and depressed mood.  Pt rates depression at an 8 and anxiety at a 7 today.  Pt denies SI/HI.  Pt is complaining of his arm pain.  Pt wants to go to [facility].  SW called for bed availability but no beds available today.  No further needs voiced by pt at this time.  Safety planning and suicide prevention discussed.  Pt participated in discussion and acknowledged an understanding of the information provided.       Regan Lemming, LCSWA 11/16/2011  10:13 AM

## 2011-11-16 NOTE — Progress Notes (Addendum)
Pt has a very flat affect and appears very depressed. Stated he has not seen his 64 year old grandson times 2 years stating with some of his drinking his daughter does not want him around his grandson. Pt c/o right arm and finger swelling. Positive capillary refill. Hand cool but not discolored. MD aware , Dr Einar Grad and pt to see an orthpedic specialistt today at [place]. Splint remains intact and pt does c/o pain to the right middle finger. Spoke with ortho office and relayed this information, pt will be transported via security with a tech accompanying him. Pt does cotract for safety but is very upset as stated he is not sure what will happen as far as placement for him.He c/o his energy level is very low. He has been eating and sleeping is fair. ,.Pt denies SI or HI and contracts for safety. Q15 minute checks remain in place. Pt taken to his orthopedic appointment. Accompained by Patience. Pt prior to leaving stated his arm pain was better. Pt was able to move his right arm but stated it felt,"very tight." pt was abel to wiggle fingers and stated he did have feeling i nhis fingers. Unable to palpate radial pulse to to placement of the split and ace wraps. Pt was to see a Dr. Junius Roads at [facility]. Tramadol 50mg  given for right arm and right hip pain,.pt will be returning from ortho office. Follow up appointment is Oct 31st at 1:15pm . Pt is to exercise arm and tech, Pateince was shown the exercises. Ace wrap was removed. Pt did not have his right hip evaluated by Dr. Junius Roads. Note went with pt explaining pt was having pain in his right arm and right hip. Office staff stated," we will evaluate his right hip in 2 weeks if it still bothers him,."Concern was expressed and office staff stated pt did not mention anything to the MD about right hip pain. Pt demonstrated how to perform arm /hand exercises. Report to [facility].

## 2011-11-17 MED ORDER — FLUOXETINE HCL 20 MG PO CAPS
20.0000 mg | ORAL_CAPSULE | Freq: Every day | ORAL | Status: DC
  Administered 2011-11-18 – 2011-11-22 (×5): 20 mg via ORAL
  Filled 2011-11-17 (×7): qty 1

## 2011-11-17 NOTE — Progress Notes (Signed)
[facility] MD Progress Note  11/17/2011 11:24 AM  S: Patient seen. His arm cast was removed yesterday and he reports feeling better. Mood continues to be low. Worried about his homelessness.  Diagnosis:   Axis I: Cocaine abuse, Mood d/o nos Axis II: No diagnosis Axis III:  Past Medical History  Diagnosis Date  . Hyperlipidemia   . Hypertension   . Hepatitis C   . Alcohol abuse     cocaine and tobacco  . Obesity   . Syphilis, secondary     treated  . Sebaceous cyst   . Chronic kidney disease     deemed secondary to HCTZ and lisinopril   Axis IV: economic problems, housing problems and occupational problems Axis V: 51-60 moderate symptoms  ADL's:  Intact  Sleep: Fair  Appetite:  Fair  Mental Status Examination/Evaluation: Objective:  Appearance: Casual  Eye Contact::  Fair  Speech:  Slow  Volume:  Decreased  Mood:  Depressed  Affect:  Congruent  Thought Process:  Coherent  Orientation:  Full  Thought Content:  WDL  Suicidal Thoughts:  No  Homicidal Thoughts:  No  Memory:  Immediate;   Fair Recent;   Fair Remote;   Fair  Judgement:  Fair  Insight:  Fair  Psychomotor Activity:  Normal  Concentration:  Fair  Recall:  Fair  Akathisia:  No  Handed:  Right  AIMS (if indicated):     Assets:  Desire for Improvement  Sleep:  Number of Hours: 6.25    Vital Signs:Blood pressure 121/73, pulse 103, temperature 97.1 F (36.2 C), temperature source Oral, resp. rate 16, height 6\' 2"  (1.88 m), weight 109.317 kg (241 lb). Current Medications: Current Facility-Administered Medications  Medication Dose Route Frequency Provider Last Rate Last Dose  . alum & mag hydroxide-simeth (MAALOX/MYLANTA) 200-200-20 MG/5ML suspension 30 mL  30 mL Oral Q4H PRN Shuvon Rankin, NP      . feeding supplement (ENSURE COMPLETE) liquid 237 mL  237 mL Oral Q breakfast Linwood Dibbles, RD   237 mL at 11/16/11 0929  . FLUoxetine (PROZAC) capsule 10 mg  10 mg Oral Daily Puneet Masoner, MD   10 mg at  11/17/11 0828  . hydrochlorothiazide (HYDRODIURIL) tablet 25 mg  25 mg Oral Daily Nena Polio, PA-C   25 mg at 11/17/11 [id number]  . magnesium hydroxide (MILK OF MAGNESIA) suspension 30 mL  30 mL Oral Daily PRN Shuvon Rankin, NP      . multivitamin with minerals tablet 1 tablet  1 tablet Oral Daily Shuvon Rankin, NP   1 tablet at 11/17/11 0828  . thiamine (VITAMIN B-1) tablet 100 mg  100 mg Oral Daily Shuvon Rankin, NP   100 mg at 11/17/11 0828  . traMADol (ULTRAM) tablet 50 mg  50 mg Oral Q6H PRN Nena Polio, PA-C   50 mg at 11/17/11 [id number]  . traZODone (DESYREL) tablet 25 mg  25 mg Oral QHS,MR X 1 Shuvon Rankin, NP   25 mg at 11/15/11 2202    Lab Results: No results found for this or any previous visit (from the past 48 hour(s)).  Physical Findings: AIMS: Facial and Oral Movements Muscles of Facial Expression: None, normal Lips and Perioral Area: None, normal Jaw: None, normal Tongue: None, normal,Extremity Movements Upper (arms, wrists, hands, fingers): None, normal Lower (legs, knees, ankles, toes): None, normal, Trunk Movements Neck, shoulders, hips: None, normal, Overall Severity Severity of abnormal movements (highest score from questions above): None, normal Incapacitation due to abnormal movements: None,  normal Patient's awareness of abnormal movements (rate only patient's report): No Awareness, Dental Status Current problems with teeth and/or dentures?: No Does patient usually wear dentures?: No  CIWA:  CIWA-Ar Total: 0  COWS:  COWS Total Score: 1   Treatment Plan Summary: Daily contact with patient to assess and evaluate symptoms and progress in treatment Medication management  Plan: Increase Prozac to 20mg  poq d. Continue current plan of care.  Briceson Broadwater 11/17/2011, 11:24 AM

## 2011-11-17 NOTE — Progress Notes (Signed)
[facility] Group Notes:  (Counselor/Nursing/MHT/Case Management/Adjunct)  11/17/2011 11:22 AM  Type of Therapy:  Psychoeducational Skills  Participation Level:  Active  Participation Quality:  Appropriate  Affect:  Appropriate  Cognitive:  Appropriate  Insight:  Good  Engagement in Group:  Good  Engagement in Therapy:  Good  Modes of Intervention:  Socialization  Summary of Progress/Problems:Staff engaged patient in a therapeutic activity that involved them answering one question and elaborating on their answer. Patient was engaged and cooperative during group and expressed their feelings in a therapeutic manner. Patient was open and honest when providing an answer.       Redmond Pulling O 11/17/2011, 11:22 AM

## 2011-11-17 NOTE — Progress Notes (Signed)
[facility] Group Notes:  (Counselor/Nursing/MHT/Case Management/Adjunct)   Type of Therapy:  Group Therapy at 1:15  Participation Level:  Minimal  Participation Quality:  Drowsy and Sharing  Affect:  Flat  Cognitive:  Oriented  Insight:  Limited  Engagement in Group:  Limited  Engagement in Therapy:  Limited  Modes of Intervention:  Clarification, Socialization and Support  Summary of Progress/Problems: Focus of group processing session was feelings about relapse. Bruce Hayes sat with eyes closed for much of group time but did participate in discussion when called upon.  He  shared that for him recovery and sobriety would mean connections with other people, especially family.  Bruce Hayes also shared about the [facility] he was involved in with previous treatment center.    [name] 11/17/2011, 4:39 PM

## 2011-11-17 NOTE — Progress Notes (Signed)
Patient did attend the evening karaoke group. Pt was attentive, supportive, and participated by singing a song.

## 2011-11-17 NOTE — Progress Notes (Signed)
D.  Pt positive for evening AA group, see group notes.  Interacting appropriately within milieu.  No complaints voiced other than continued right arm pain due to fracture.  Denies SI/HI/hallucinations at this time.  A.  Medication given as ordered for arm pain, see pain scale.  Support and encouragement offered.  R.  No acute distress noted, will continue to monitor.

## 2011-11-17 NOTE — Progress Notes (Signed)
D Pt is seen OOB UAL on the 300 hall today. HE has a sad, dperessed, flat affect. He is worried about his future...where he will go ( he is homeless) and cannot get past this. He is seen in his groups as planned but demonstrates minimal  Insight into his problems.   A He completed his AM self inventory and on it he wrote he denied SI within the past 24 hrs, he rated his feelings of depression and hopelessness " "7/4" and stated his DC plan is to " think positive" in the future.   R POC includes coninuing to foster therapeutic relationship and support pt as needed PD RN [facility]

## 2011-11-17 NOTE — Discharge Planning (Signed)
Pt was seen this morning during discharge planning group.  Pt denies SI/HI/AVH.  Pt rated his depression level at a six and his depression level at a zero.  Pt is still waiting for a bed at [facility]. Pt is currently homeless.  CM is working with pt to resolve issues.

## 2011-11-18 DIAGNOSIS — F39 Unspecified mood [affective] disorder: Secondary | ICD-10-CM

## 2011-11-18 NOTE — Progress Notes (Signed)
D.  Pt pleasant on approach.  Pt states that he would like to speak to the doctor about getting a wrist brace for his right wrist.  Denies SI/HI/hallucinations at this time.  Interacting appropriately within milieu.  Positive for evening AA group.  Continues to endorse pain to his right arm due to fracture.  Sling in place.  A.  Support and encouragement offered, medication given as ordered for pain.  R.  No acute distress noted, will continue to monitor.

## 2011-11-18 NOTE — Progress Notes (Signed)
Pt is very calm and cooperative. Has spent most of his time in the dayroom with the other pts. apppears very quiet today and somewhat withdrawn. Does contract for safety  And denies SI or HI. Pt has been utilizing his soft ball to do hand exercises and deneis any arm or hand pain. Remains on Q15 minute checks and remains safe.

## 2011-11-18 NOTE — Progress Notes (Signed)
[facility] Patient Progress Note 11/18/2011 3:42 PM DALVON CENTNER [birth year] [id number] Hospital day #:8 Diagnosis:  Diagnosis:  Axis I: Cocaine abuse, Mood d/o nos  Axis II: No diagnosis  Axis III:  Past Medical History   Diagnosis  Date   .  Hyperlipidemia    .  Hypertension    .  Hepatitis C    .  Alcohol abuse      cocaine and tobacco   .  Obesity    .  Syphilis, secondary      treated   .  Sebaceous cyst    .  Chronic kidney disease      deemed secondary to HCTZ and lisinopril   Axis IV: economic problems, housing problems and occupational problems  Axis V: 51-60 moderate symptoms   ADL's:  Intact Sleep:  No change Appetite:ok  Groups:Limited  Subjective: Mr. Marsili states his sister is going to take him to [facility] on Monday  Morning at [facility] for an interview there, but he is not guaranteed a bed at that time.  He also notes that if he does not get in then he has no place else to go.   height is 6\' 2"  (1.88 m) and weight is 109.317 kg (241 lb). His oral temperature is 98 F (36.7 C). His blood pressure is 152/78 and his pulse is 98. His respiration is 20.   Objective: He is up and active on the unit milieu. No new complaints.  Ros: Constitutional: WD WN Adult in NAD ENT:        Negative for runny nose, sore throat, congestion, dysphagia COR:       Negative for cough, SOB, chest pain, wheezing GI:         Negative for Nausea, vomiting, diarrhea, constipation, abdominal pain Neuro:  Negative for dizziness, blurred vision, headaches, numbness or tingling Ortho:   positive for limb pain, swelling, change in ambulatory status.  Mental Status Exam Level of Consciousness: awake Orientation: x 3 General Appearance:  casual Behavior:  cooperative Eye Contact:  good Motor Behavior:  normal Speech:  clear Mood: depressed Suicidal Ideation: No suicidal ideation, no plan, no intent, no means. Homicidal Ideation:  No homicidal ideation, no plan, no intent, no  means.  Affect:  congruent Anxiety Level: mild,  Thought Process:  linear Thought Content:  No AVH Perception:  intact Judgment:  fair Insight:   fair,  Cognition: average, Sleep:  Number of Hours: 6  Lab Results: No results found for this or any previous visit (from the past 48 hour(s)). Labs are reviewed. Physical Findings: AIMS: CIWA-Ar Total: 0  CIWA:  CIWA-Ar Total: 0  COWS:  COWS Total Score: 1  Medication:  . feeding supplement  237 mL Oral Q breakfast  . FLUoxetine  20 mg Oral Daily  . hydrochlorothiazide  25 mg Oral Daily  . multivitamin with minerals  1 tablet Oral Daily  . thiamine  100 mg Oral Daily  . traZODone  25 mg Oral QHS,MR X 1    Treatment Plan Summary: 1. Admit for crisis management and stabilization. 2. Medication management to reduce current symptoms to base line and improve the  patient's overall level of functioning 3. Treat health problems as indicated. 4. Develop treatment plan to decrease risk of relapse upon discharge and the need for readmission. 5. Psycho-social education regarding relapse prevention and self care. 6. Health care follow up as needed for medical problems. 7. Restart home medications where appropriate.   Plan:  1. Continue current plan of care. Strongly encouraged patient to investigate a plan B incase he does not get a bed at [facility] on Monday. 2. No changes at this time. Marlane Hatcher. Rosaleigh Brazzel PAC 11/18/2011, 3:42 PM

## 2011-11-18 NOTE — Progress Notes (Signed)
Patient ID: Bruce Hayes, male   DOB: [birth year], 66 y.o.   MRN: [id number] D: Pt is awake and active on the unit this AM. Pt denies SI/HI and A/V hallucinations. Pt is participating in the milieu and is cooperative with staff. Pt rates their depression at 7 and hopelessness at 0. Pt's most recent CIWA score was . Pt mood is depressed/sad and his affect blunted. Pt is somewhat withdrawn but he is responsive to staff. Pt wears a sling on his right arm but it was not positioned properly and causing discomfort.  A: Writer utilized therapeutic communication and administered medication per MD orders. Writer also encouraged pt to discuss feelings with staff and attend groups. Writer explained how to wear it properly which provided some relief. Writer also encouraged pt to follow up with AA support groups after d/c.   R: Pt is attending groups and tolerating medications well. Writer will continue to monitor. 15 minute checks are ongoing for safety.

## 2011-11-18 NOTE — Progress Notes (Signed)
Patient ID: Bruce Hayes, male   DOB: 1945/09/17, 66 y.o.   MRN: [id number]  Pt. attended and participated in aftercare planning group. Pt. accepted information on suicide prevention, warning signs to look for with suicide and crisis line numbers to use. The pt. agreed to call crisis line numbers if having warning signs or having thoughts of suicide. Pt. Rated his anxiety and depression levels as a 9/10 stating that he is feeling "very bad."  Pt. Kept his eyes closed for the majority of the group.

## 2011-11-18 NOTE — Progress Notes (Signed)
Psychoeducational Group Note  Date:  11/18/2011 Time:  1025  Group Topic/Focus:  Identifying Needs:   The focus of this group is to help patients identify their personal needs that have been historically problematic and identify healthy behaviors to address their needs.  Participation Level:  Minimal  Participation Quality:  Appropriate, Drowsy, Redirectable and Resistant  Affect:  Appropriate and Flat  Cognitive:  Appropriate and Oriented  Insight:  Good  Engagement in Group:  Limited  Additional Comments:  Pt attended and participated in group discussing/identifying needs. Pts identified both positive and negative means of how they met their needs. Pt discussed advocating for himself rather than relying on staff at [facility] though he was very thankful and appreciative of all that has been done for him.   Joneen Caraway 11/18/2011, 11:53 AM

## 2011-11-18 NOTE — Progress Notes (Signed)
Patient did attend the evening speaker AA meeting.  

## 2011-11-18 NOTE — Progress Notes (Signed)
Patient ID: Bruce Hayes, male   DOB: 19-May-1945, 66 y.o.   MRN: [id number]  [facility] Group Notes:  (Counselor/Nursing/MHT/Case Management/Adjunct)  11/18/2011 1:15 PM  Type of Therapy:  Group Therapy, Dance/Movement Therapy   Participation Level:  Minimal  Participation Quality:  Drowsy and Inattentive  Affect:  Flat  Cognitive:  Confused  Insight:  None  Engagement in Group:  Limited  Engagement in Therapy:  Limited  Modes of Intervention:  Clarification, Problem-solving, Role-play, Socialization and Support  Summary of Progress/Problems:  Pt attended counseling group on energy and ways that we take in or use our energy.  Pt engaged in group discussion and experiential activity to provide a visual representation of the abstract concept of energy use and flow.  Pt fell asleep often during group and did not participate actively.      Einar Gip 11/18/2011. 2:27 PM

## 2011-11-19 NOTE — Progress Notes (Signed)
Psychoeducational Group Note  Date:  11/19/2011 Time:  1015  Group Topic/Focus:  Making Healthy Choices:   The focus of this group is to help patients identify negative/unhealthy choices they were using prior to admission and identify positive/healthier coping strategies to replace them upon discharge.  Participation Level:  Did Not Attend   Hoyt Koch Mollie Rossano Leanna Sato 11/19/2011, 2:27 PM

## 2011-11-19 NOTE — Progress Notes (Addendum)
Patient ID: Bruce Hayes, male   DOB: Apr 16, 1945, 66 y.o.   MRN: [id number] D: Pt is awake and active on the unit this AM. Pt denies SI/HI and A/V hallucinations. Pt is participating in the milieu and is cooperative with staff. Pt's most recent CIWA score was 0. Pt mood is depressed and his affect is flat/blunted. Pt c/o pain in his R arm and shoulder. Pt is lethargic and ambulates slowly on the unit.   A: Writer utilized therapeutic communication, encouraged pt to discuss feelings with staff and administered medication per MD orders. Writer also encouraged pt to attend groups. Writer provided pt with a padded sleeve to support his R wrist. Pt forwards little to staff and is somewhat withdrawn in the milieu.   R: Pt is attending most groups and tolerating medications well. Writer will continue to monitor. 15 minute checks are ongoing for safety. Pt stated that the sleeve was effective in providing additional support for his wrist. Pt also states that he plans to stay sober after d/c.

## 2011-11-19 NOTE — Discharge Planning (Signed)
Pt was seen this morning during discharge planning group. Pt stated that he slept well and appetite is good. Pt rated depression level at a three and anxiety level at an three. Pt denies SI/HI/AVH.

## 2011-11-19 NOTE — Progress Notes (Signed)
Reviewed document and agree with the treatment recommendations

## 2011-11-19 NOTE — Progress Notes (Signed)
Patient did attend the evening speaker AA meeting.  

## 2011-11-19 NOTE — Progress Notes (Signed)
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   ADL's:  Impaired  Sleep: Good  Appetite:  Good  Suicidal Ideation:  None  Homicidal Ideation:  None   AEB (as evidenced by):  Mental Status Examination/Evaluation: Objective:  Appearance: Fairly Groomed  Eye Contact::  Good  Speech:  Clear and Coherent and Normal Rate  Volume:  Normal  Mood:  Anxious  Affect:  Congruent  Thought Process:  Clear rational goal oriented- will pass on intake tomorrow as there is no bed guarantee and he has no place to go   Orientation:  Full  Thought Content:  Denies AVH/psychosis  Suicidal Thoughts:  No  Homicidal Thoughts:  No  Memory:  Immediate;   Good  Judgement:  Fair  Insight:  Fair  Psychomotor Activity:  Normal  Concentration:  Fair  Recall:  Good  Akathisia:  No  Handed:  Right  AIMS (if indicated):     Assets:  Warehouse manager Resources/Insurance  Sleep:  Number of Hours: 4.25    Vital Signs:Blood pressure 149/86, pulse 90, temperature 97.4 F (36.3 C), temperature source Oral, resp. rate 20, height 6\' 2"  (1.88 m), weight 109.317 kg (241 lb). Current Medications: Current Facility-Administered Medications  Medication Dose Route Frequency Provider Last Rate Last Dose  . alum & mag hydroxide-simeth (MAALOX/MYLANTA) 200-200-20 MG/5ML suspension 30 mL  30 mL Oral Q4H PRN Shuvon Rankin, NP      . feeding supplement (ENSURE COMPLETE) liquid 237 mL  237 mL Oral Q breakfast Linwood Dibbles, RD   237 mL at 11/19/11 0821  . FLUoxetine (PROZAC) capsule 20 mg  20 mg Oral Daily Himabindu Ravi, MD   20 mg at 11/19/11 0821  . hydrochlorothiazide (HYDRODIURIL) tablet 25 mg  25 mg Oral Daily Nena Polio, PA-C   25 mg at 11/19/11 [id number]  . magnesium hydroxide (MILK OF MAGNESIA) suspension 30 mL  30 mL Oral Daily PRN Shuvon Rankin, NP      . multivitamin with minerals tablet 1 tablet  1 tablet Oral Daily  Shuvon Rankin, NP   1 tablet at 11/19/11 0821  . thiamine (VITAMIN B-1) tablet 100 mg  100 mg Oral Daily Shuvon Rankin, NP   100 mg at 11/19/11 0821  . traMADol (ULTRAM) tablet 50 mg  50 mg Oral Q6H PRN Nena Polio, PA-C   50 mg at 11/19/11 0825  . traZODone (DESYREL) tablet 25 mg  25 mg Oral QHS,MR X 1 Shuvon Rankin, NP   25 mg at 11/18/11 2156    Lab Results: No results found for this or any previous visit (from the past 48 hour(s)).  Physical Findings: AIMS: Facial and Oral Movements Muscles of Facial Expression: None, normal Lips and Perioral Area: None, normal Jaw: None, normal Tongue: None, normal,Extremity Movements Upper (arms, wrists, hands, fingers): None, normal Lower (legs, knees, ankles, toes): None, normal, Trunk Movements Neck, shoulders, hips: None, normal, Overall Severity Severity of abnormal movements (highest score from questions above): None, normal Incapacitation due to abnormal movements: None, normal Patient's awareness of abnormal movements (rate only patient's report): No Awareness, Dental Status Current problems with teeth and/or dentures?: No Does patient usually wear dentures?: No  CIWA:  CIWA-Ar Total: 2  COWS:  COWS Total Score: 1   Treatment Plan Summary: Continue current plan of care.  Declines going to intake at [facility] as bed is not secured and sister may not be able to get here to take to interview. With his feet and arm does not feel he could mange at a shelter at this time.   [place] Asberry,MICKIE D. 11/19/2011, 3:38 PM

## 2011-11-19 NOTE — Progress Notes (Signed)
Psychoeducational Group Note  Date:  11/19/2011 Time:  0915  Group Topic/Focus:  Goals Group:   The focus of this group is to help patients establish daily goals to achieve during treatment and discuss how the patient can incorporate goal setting into their daily lives to aide in recovery.  Participation Level:  Did Not Attend  Participation Quality:    Affect:    Additional Comments:    Pricilla Larsson 11/19/2011, 10:17 AM

## 2011-11-19 NOTE — Progress Notes (Signed)
[facility] Group Notes:  (Counselor/Nursing/MHT/Case Management/Adjunct)  11/19/2011 4:04 PM  Type of Therapy: Group Therapy   Participation Level: Active   Participation Quality: Appropriate   Affect: Appropriate   Cognitive: Appropriate   Insight: Good   Engagement in Group: Good   Engagement in Therapy: Good   Modes of Intervention: Support, Socialization, Clarification   Summary of Progress/Problems:Pt. participated in group on healthy support systems by identifying the the healthy supports within their individual lives. The group focused on how to reach out to supports without feeling like a burden. Pt expressed feeling he did not have a healthy support group due to the alcohol abuse.  Haywood Filler Renee 11/19/2011, 4:04 PM

## 2011-11-19 NOTE — Progress Notes (Signed)
Psychoeducational Group Note  Date:  11/19/2011 Time: 1515  Group Topic/Focus:  Making Healthy Choices:   The focus of this group is to help patients identify negative/unhealthy choices they were using prior to admission and identify positive/healthier coping strategies to replace them upon discharge.  Participation Level:  Active  Participation Quality:  Appropriate  Affect:  Appropriate  Cognitive:  Appropriate  Insight:  Good  Engagement in Group:  Good  Additional Comments:  Patient expressed three goals one goal before he leave, to leave with a plan, and become successful with the program once discharged.   Bruce Hayes 11/19/2011, 6:31 PM

## 2011-11-19 NOTE — Progress Notes (Signed)
D. Pt voiced no complaints other than continued discomfort from his fractured right arm.  Denies SI/HI/hallucinations at this time.  Interacting appropriately within milieu.  Positive for evening AA group.  A.  Support and encouragement offered, medication given as ordered for pain management, see flow sheet.  R.  Pt remains safe, no acute distress noted.  Will continue to monitor.

## 2011-11-20 DIAGNOSIS — F1994 Other psychoactive substance use, unspecified with psychoactive substance-induced mood disorder: Secondary | ICD-10-CM | POA: Diagnosis present

## 2011-11-20 DIAGNOSIS — F1910 Other psychoactive substance abuse, uncomplicated: Secondary | ICD-10-CM

## 2011-11-20 NOTE — Progress Notes (Addendum)
D; patient states he slept well last nite and his appetite is good eating in the DR, energy level is normal and ability to pay attention is good, depressed 4/10 and hopeless 1/10, anxiety 3/10, denies Si or Hi, does state his arm and leg has pain for which prn med given. Is attending group and interacting w/peers in the dayroom, R arm circulation good, sling in use, instructed how to carry arm in sling A q8mins safety checks continue and support offered, encouraged to let staff know what his pain is doing and ask for pain med more often, ordered q6h. Is only using it 1-2 times per day. R Patient remains safe on the hall

## 2011-11-20 NOTE — Progress Notes (Signed)
Psychoeducational Group Note  Date:  11/20/2011 Time:  1000  Group Topic/Focus:  Self Care:   The focus of this group is to help patients understand the importance of self-care in order to improve or restore emotional, physical, spiritual, interpersonal, and financial health.  Participation Level:  Minimal  Participation Quality:  Drowsy  Affect:  Flat  Cognitive:  Appropriate  Insight:  Good  Engagement in Group:  Good  Additional Comments:  none  Shi Blankenship, [name] 11/20/2011, 2:09 PM

## 2011-11-20 NOTE — Progress Notes (Signed)
Psychoeducational Group Note  Date:  11/20/2011 Time:  1100  Group Topic/Focus:  Self Care:   The focus of this group is to help patients understand the importance of self-care in order to improve or restore emotional, physical, spiritual, interpersonal, and financial health.  Participation Level:  Active  Participation Quality:  Appropriate, Sharing and Supportive  Affect:  Appropriate  Cognitive:  Appropriate  Insight:  Good  Engagement in Group:  Good  Additional Comments:  none  Karlea Mckibbin, [name] 11/20/2011, 2:11 PM

## 2011-11-20 NOTE — Progress Notes (Signed)
Aftercare Planning Group: 11/20/2011 9:45 AM Pt attended discharge planning group and actively participated in group.  SW provided pt with today's workbook.  Pt presents with calm mood and affect.  Pt rates depression at a 6 and anxiety at a 5 today.  Pt denies SI.  Pt states that he put himself in a wheelchair because his foot hurts.  Pt states that he will be able to to walk and climb stairs at [facility].  Pt is waiting for placement at [facility].  No further needs voiced by pt at this time.  Safety planning and suicide prevention discussed.  Pt participated in discussion and acknowledged an understanding of the information provided.       [facility] Group Notes:  (Counselor/Nursing/MHT/Case Management/Adjunct)  11/20/2011  1:15 PM  Type of Therapy:  Group Therapy  Participation Level:  Did Not Attend     Regan Lemming, [place] 11/20/2011 10:28 AM

## 2011-11-20 NOTE — [facility] Suicide Risk Assessment (Signed)
Suicide Risk Assessment  Admission Assessment     Nursing information obtained from:  Patient Demographic factors:  Male Current Mental Status:  NA Loss Factors:  Loss of significant relationship Historical Factors:  Anniversary of important loss;Family history of mental illness or substance abuse Risk Reduction Factors:  Sense of responsibility to family (sister)  CLINICAL FACTORS:   Alcohol/Substance Abuse/Dependencies More than one psychiatric diagnosis  COGNITIVE FEATURES THAT CONTRIBUTE TO RISK: None    SUICIDE RISK:   Mild:  Suicidal ideation of limited frequency, intensity, duration, and specificity.  There are no identifiable plans, no associated intent, mild dysphoria and related symptoms, good self-control (both objective and subjective assessment), few other risk factors, and identifiable protective factors, including available and accessible social support.  PLAN OF CARE: Work on coping skills/relapse prevention Placement  Bruce Hayes 11/20/2011, 2:26 PM

## 2011-11-20 NOTE — Progress Notes (Signed)
[facility] MD Progress Note  11/20/2011 2:28 PM  Diagnosis:   Axis I: Mood Disorder NOS and Substance Abuse Axis II: Deferred Axis III:  Past Medical History  Diagnosis Date  . Hyperlipidemia   . Hypertension   . Hepatitis C   . Alcohol abuse     cocaine and tobacco  . Obesity   . Syphilis, secondary     treated  . Sebaceous cyst   . Chronic kidney disease     deemed secondary to HCTZ and lisinopril   Axis IV: economic problems, housing problems and problems with primary support group Axis V: 51-60 moderate symptoms  ADL's:  Intact  Sleep: Good  Appetite:  Fair  Suicidal Ideation:  Plan:  None Intent:  None Means:  None Homicidal Ideation:  Plan:  None Intent:  None Means:  None  AEB (as evidenced by):  Mental Status Examination/Evaluation: Objective:  Appearance: Fairly Groomed  Eye Contact::  Minimal  Speech:  Slow  Volume:  Decreased  Mood:  Depressed  Affect:  Restricted  Thought Process:  Intact, Linear and Logical  Orientation:  Full  Thought Content:  WDL  Suicidal Thoughts:  No  Homicidal Thoughts:  No  Memory:  Immediate;   Fair Recent;   Fair Remote;   Fair  Judgement:  Fair  Insight:  Shallow  Psychomotor Activity:  Decreased  Concentration:  Fair  Recall:  Fair  Akathisia:  No  Handed:  Right  AIMS (if indicated):     Assets:  Desire for Improvement Social Support  Sleep:  Number of Hours: 5    Vital Signs:Blood pressure 103/72, pulse 96, temperature 97.7 F (36.5 C), temperature source Oral, resp. rate 18, height 6\' 2"  (1.88 m), weight 109.317 kg (241 lb). Current Medications: Current Facility-Administered Medications  Medication Dose Route Frequency Provider Last Rate Last Dose  . alum & mag hydroxide-simeth (MAALOX/MYLANTA) 200-200-20 MG/5ML suspension 30 mL  30 mL Oral Q4H PRN Shuvon Rankin, NP      . feeding supplement (ENSURE COMPLETE) liquid 237 mL  237 mL Oral Q breakfast Linwood Dibbles, RD   237 mL at 11/19/11 0821  .  FLUoxetine (PROZAC) capsule 20 mg  20 mg Oral Daily Himabindu Ravi, MD   20 mg at 11/20/11 0814  . hydrochlorothiazide (HYDRODIURIL) tablet 25 mg  25 mg Oral Daily Nena Polio, PA-C   25 mg at 11/20/11 0814  . magnesium hydroxide (MILK OF MAGNESIA) suspension 30 mL  30 mL Oral Daily PRN Shuvon Rankin, NP      . multivitamin with minerals tablet 1 tablet  1 tablet Oral Daily Shuvon Rankin, NP   1 tablet at 11/20/11 0814  . thiamine (VITAMIN B-1) tablet 100 mg  100 mg Oral Daily Shuvon Rankin, NP   100 mg at 11/20/11 0814  . traMADol (ULTRAM) tablet 50 mg  50 mg Oral Q6H PRN Nena Polio, PA-C   50 mg at 11/20/11 0816  . traZODone (DESYREL) tablet 25 mg  25 mg Oral QHS,MR X 1 Shuvon Rankin, NP   25 mg at 11/19/11 2134    Lab Results: No results found for this or any previous visit (from the past 48 hour(s)).  Physical Findings: AIMS: Facial and Oral Movements Muscles of Facial Expression: None, normal Lips and Perioral Area: None, normal Jaw: None, normal Tongue: None, normal,Extremity Movements Upper (arms, wrists, hands, fingers): None, normal Lower (legs, knees, ankles, toes): None, normal, Trunk Movements Neck, shoulders, hips: None, normal, Overall Severity Severity of  abnormal movements (highest score from questions above): None, normal Incapacitation due to abnormal movements: None, normal Patient's awareness of abnormal movements (rate only patient's report): No Awareness, Dental Status Current problems with teeth and/or dentures?: No Does patient usually wear dentures?: No  CIWA:  CIWA-Ar Total: 2  COWS:  COWS Total Score: 1   Treatment Plan Summary: Daily contact with patient to assess and evaluate symptoms and progress in treatment Medication management  Plan: Supportive approach/coping skills/relapse prevention Nehan Flaum A 11/20/2011, 2:28 PM

## 2011-11-20 NOTE — Tx Team (Signed)
Interdisciplinary Treatment Plan Update (Adult)  Date:  11/20/2011  Time Reviewed:  9:42 AM   Progress in Treatment: Attending groups: Yes Participating in groups:  Yes Taking medication as prescribed: Yes Tolerating medication:  Yes Family/Significant othe contact made:  Counselor assessing for appropriate contact Patient understands diagnosis:  Yes Discussing patient identified problems/goals with staff:  Yes Medical problems stabilized or resolved:  Yes Denies suicidal/homicidal ideation: Yes Issues/concerns per patient self-inventory:  None identified Other: N/A  New problem(s) identified: None Identified  Reason for Continuation of Hospitalization: Anxiety Depression Medication Management   Interventions implemented related to continuation of hospitalization: mood stabilization, medication monitoring and adjustment, group therapy and psycho education, safety checks q 15 mins  Additional comments: N/A  Estimated length of stay: 3-5 days  Discharge Plan: SW is assessing for appropriate referrals.    New goal(s): N/A  Review of initial/current patient goals per problem list:    1.  Goal(s): Address substance use  Met:  No  Target date: by discharge  As evidenced by: completing detox protocol and refer to appropriate treatment  2.  Goal (s): Reduce depressive and anxiety symptoms  Met:  No  Target date: by discharge  As evidenced by: Reducing depression from a 10 to a 3 as reported by pt.  Pt rates depression at a 6 and anxiety at a 5 today.    Attendees: Patient:     Family:     Physician: Carlton Adam, MD 11/20/2011 9:42 AM   Nursing: Satira Sark, RN 11/20/2011 9:42 AM   Clinical Social Worker:  Regan Lemming, [place] 11/20/2011  9:42 AM   Other: Bennye Alm, RN 11/20/2011  9:42 AM   Other:  Waylan Boga, NP 11/20/2011 9:44 AM   Other:  Toula Moos, psychology intern 11/20/2011 9:44 AM   Other:  Rhunette Croft, RN 11/20/2011 9:45 AM   Other:       Scribe for Treatment Team:   Regan Lemming 11/20/2011 9:42 AM

## 2011-11-21 DIAGNOSIS — F1994 Other psychoactive substance use, unspecified with psychoactive substance-induced mood disorder: Principal | ICD-10-CM

## 2011-11-21 NOTE — Progress Notes (Signed)
Psychoeducational Group Note  Date:  11/21/2011 Time: 11:00am  Group Topic/Focus:  Recovery Goals:   The focus of this group is to identify appropriate goals for recovery and establish a plan to achieve them.  Participation Level:  Active  Participation Quality:  Appropriate, Attentive and Sharing  Affect:  Appropriate  Cognitive:  Appropriate  Insight:  Good  Engagement in Group:  Good  Additional Comments:  Patient attended recovery goals group and participated. Patient define recovery in own terms, and then stated a situation where a plan was set for recovery. Patient worked in Neurosurgeon and completed the sheet on my personal goals for recovery. Patient encouraged to explain worksheet and state goals that would help change when in the stage of recovery.       Redmond Pulling O 11/21/2011, 7:18 PM

## 2011-11-21 NOTE — Progress Notes (Signed)
[facility] Group Notes:  (Counselor/Nursing/MHT/Case Management/Adjunct)  11/21/2011 2:09 PM  Type of Therapy:  Psychoeducational Skills  Participation Level:  Minimal  Participation Quality:  Redirectable, Resistant and Sharing  Affect:  Blunted and Irritable  Cognitive:  Appropriate and Oriented  Insight:  Limited  Engagement in Group:  Limited  Engagement in Therapy:  n/a  Modes of Intervention:  Activity, Education, Problem-solving, Socialization and Support  Summary of Progress/Problems: Bruce Hayes attended Psychoeducational group that focused on using quality time with support systems/individuals to engage in healthy coping skills. Bruce Hayes participated with encouragement in activity guessing about self and peers. Bruce Hayes was quiet but responded with prompting while group discussed who their support systems arte, how they can spend positive quality time together as a way to strengthen the relationship and use coping skills. Bruce Hayes was give a homework assignment to find two ways to improve his support systems and twenty activities he can do to spend quality time with his supports. Bruce Hayes listed his "homeboys" and his sister as his supports, stated his mother was his best friend but that he recently lost her. Stated he would like to try to spend more time with his sister by accepting he dinner invitations and that he enjoys playing poker with his "homeboys".   [name] 11/21/2011, 2:09 PM

## 2011-11-21 NOTE — Progress Notes (Addendum)
D:  Patient up and attending groups today.  Active in the unit milieu.  Appetite good.  Patient rates depression at a 4 and hopelessness at 2.  He has not requested PRN pain medications thus far this shift.  He denies suicidal thoughts or withdrawal symptoms.   A:  Medications given per orders.  Patient encourage to continue attending and participating in groups.  R:  Pleasant and cooperative with staff and peers.  Interacting well and participating in groups.

## 2011-11-21 NOTE — Progress Notes (Signed)
Patient ID: Bruce Hayes, male   DOB: [birth year], 65 y.o.   MRN: [id number] D: Pt. Reports "feel all right" Pt. Reports going to [facility]. Pt. Has ortho support stocking to rt. Arm. A: Staff will monitor q35min for safety. Pt. Encouraged to attend group R: Pt. Is safe on the unit. Pt. Attends group. Pt. Able to move fingers freely, still favors arms, but pt. Reports moving it more.

## 2011-11-21 NOTE — Progress Notes (Signed)
[facility] MD Progress Note  11/21/2011 12:51 PM  Diagnosis:   Axis I: Rule out Substance dependence, Substance Abuse and Substance Induced Mood Disorder Axis II: Deferred Axis III:  Past Medical History  Diagnosis Date  . Hyperlipidemia   . Hypertension   . Hepatitis C   . Alcohol abuse     cocaine and tobacco  . Obesity   . Syphilis, secondary     treated  . Sebaceous cyst   . Chronic kidney disease     deemed secondary to HCTZ and lisinopril   Axis IV: housing problems and problems with primary support group Axis V: 51-60 moderate symptoms  ADL's:  Intact  Sleep: Fair  Appetite:  Fair  Suicidal Ideation:  Plan:  None Intent:  None Means:  None Homicidal Ideation:  Plan:  None Intent:  None Means:  None  Slowly getting better but endorses that if he was to be discharge without having a residential treatment in place, or a stable living situation, he knows he will relapse  Mental Status Examination/Evaluation: Objective:  Appearance: Fairly Groomed  Engineer, water::  Fair  Speech:  Slow  Volume:  Decreased  Mood:  Depressed  Affect:  Restricted  Thought Process:  Coherent, Intact and Linear  Orientation:  Full  Thought Content:  WDL  Suicidal Thoughts:  No  Homicidal Thoughts:  No  Memory:  Immediate;   Fair Recent;   Fair Remote;   Fair  Judgement:  Intact  Insight:  Present  Psychomotor Activity:  Decreased  Concentration:  Fair  Recall:  Fair  Akathisia:  No  Handed:  Right  AIMS (if indicated):     Assets:  Desire for Improvement  Sleep:  Number of Hours: 6    Vital Signs:Blood pressure 114/65, pulse 99, temperature 98.4 F (36.9 C), temperature source Oral, resp. rate 18, height 6\' 2"  (1.88 m), weight 109.317 kg (241 lb). Current Medications: Current Facility-Administered Medications  Medication Dose Route Frequency Provider Last Rate Last Dose  . alum & mag hydroxide-simeth (MAALOX/MYLANTA) 200-200-20 MG/5ML suspension 30 mL  30 mL Oral Q4H PRN  Shuvon Rankin, NP      . feeding supplement (ENSURE COMPLETE) liquid 237 mL  237 mL Oral Q breakfast Linwood Dibbles, RD   237 mL at 11/21/11 0800  . FLUoxetine (PROZAC) capsule 20 mg  20 mg Oral Daily Himabindu Ravi, MD   20 mg at 11/21/11 0759  . hydrochlorothiazide (HYDRODIURIL) tablet 25 mg  25 mg Oral Daily Nena Polio, PA-C   25 mg at 11/21/11 0800  . magnesium hydroxide (MILK OF MAGNESIA) suspension 30 mL  30 mL Oral Daily PRN Shuvon Rankin, NP      . multivitamin with minerals tablet 1 tablet  1 tablet Oral Daily Shuvon Rankin, NP   1 tablet at 11/21/11 0759  . thiamine (VITAMIN B-1) tablet 100 mg  100 mg Oral Daily Shuvon Rankin, NP   100 mg at 11/21/11 0759  . traMADol (ULTRAM) tablet 50 mg  50 mg Oral Q6H PRN Nena Polio, PA-C   50 mg at 11/21/11 0022  . traZODone (DESYREL) tablet 25 mg  25 mg Oral QHS,MR X 1 Shuvon Rankin, NP   25 mg at 11/20/11 2155    Lab Results: No results found for this or any previous visit (from the past 48 hour(s)).  Physical Findings: AIMS: Facial and Oral Movements Muscles of Facial Expression: None, normal Lips and Perioral Area: None, normal Jaw: None, normal Tongue: None, normal,Extremity  Movements Upper (arms, wrists, hands, fingers): None, normal Lower (legs, knees, ankles, toes): None, normal, Trunk Movements Neck, shoulders, hips: None, normal, Overall Severity Severity of abnormal movements (highest score from questions above): None, normal Incapacitation due to abnormal movements: None, normal Patient's awareness of abnormal movements (rate only patient's report): No Awareness, Dental Status Current problems with teeth and/or dentures?: No Does patient usually wear dentures?: No  CIWA:  CIWA-Ar Total: 0  COWS:  COWS Total Score: 1   Treatment Plan Summary: Daily contact with patient to assess and evaluate symptoms and progress in treatment Medication management  Plan: Supportive approach/coping skills/relapse prevention            Facilitate placement Corene Resnick A 11/21/2011, 12:51 PM

## 2011-11-21 NOTE — Progress Notes (Signed)
Aftercare Planning Group: 11/21/2011 9:45 AM Pt attended discharge planning group and actively participated in group.  SW provided pt with today's workbook.  Pt presents with flat affect and depressed mood.  Pt rates depression and anxiety at a 3 today.  Pt denies SI/HI.  Pt reports his arm is feeling better.  SW called for [facility] beds but no beds available today.  No further needs voiced by pt at this time.    [facility] Group Notes:  (Counselor/Nursing/MHT/Case Management/Adjunct)  11/21/2011  1:15 PM  Type of Therapy:  Group Therapy  Participation Level:  Minimal  Participation Quality:  Drowsy  Affect:  Flat  Cognitive:  Appropriate  Insight:  Limited  Engagement in Group:  Limited  Engagement in Therapy:  None  Modes of Intervention:  Clarification, Education, Socialization and Support  Summary of Progress/Problems: Patient was attentive and engaged with speaker from [facility].  Patient expressed interest in their programs and services.  Patient processed ways they can relate to the speaker.     Regan Lemming, LCSWA 11/21/2011 10:26 AM

## 2011-11-22 MED ORDER — FLUOXETINE HCL 20 MG PO CAPS: 20.0000 mg | ORAL_CAPSULE | Freq: Every day | ORAL | Status: DC

## 2011-11-22 MED ORDER — ASPIRIN 81 MG PO TABS: 81.0000 mg | ORAL_TABLET | Freq: Every day | ORAL | Status: DC

## 2011-11-22 MED ORDER — TRAMADOL HCL 50 MG PO TABS: 50.0000 mg | ORAL_TABLET | Freq: Two times a day (BID) | ORAL | Status: DC | PRN

## 2011-11-22 MED ORDER — HYDROCHLOROTHIAZIDE 12.5 MG PO TABS: 25.0000 mg | ORAL_TABLET | Freq: Every day | ORAL | Status: DC

## 2011-11-22 MED ORDER — ATORVASTATIN CALCIUM 10 MG PO TABS: 10.0000 mg | ORAL_TABLET | Freq: Every day | ORAL | Status: DC

## 2011-11-22 NOTE — Progress Notes (Signed)
(  D) Patient is positive this AM with good eye contact. Reports sleeping well, appetite good, energy level normal and ability to pay attention good. Rates depression at 3/10 and hopelessness at 8/10. Denies SI/Hi or psychosis and states that his pain is a level 3 and that that is normal for him. (A) Encourage patient to attend all groups. (R) receptive to encouragement. No cravings reported. Foster Simpson RN MS EdS 11/22/2011  8:09 AM

## 2011-11-22 NOTE — Discharge Summary (Signed)
Physician Discharge Summary Note  Patient:  Bruce Hayes is an 66 y.o., male MRN:  [id number] DOB:  05-21-1945 Patient phone:  [phone] (home)  Patient address:   [address] [address],   Date of Admission:  11/10/2011 Date of Discharge: 11/22/2011  Reason for Admission:  Alcohol and cocaine detoxication  Discharge Diagnoses: Active Problems:  * No active hospital problems. *    Axis Diagnosis:   AXIS I:  Alcohol Abuse and Substance Abuse AXIS II:  Deferred AXIS III:   Past Medical History  Diagnosis Date  . Hyperlipidemia   . Hypertension   . Hepatitis C   . Alcohol abuse     cocaine and tobacco  . Obesity   . Syphilis, secondary     treated  . Sebaceous cyst   . Chronic kidney disease     deemed secondary to HCTZ and lisinopril   AXIS IV:  economic problems, housing problems, occupational problems and other psychosocial or environmental problems AXIS V:  61-70 mild symptoms  Level of Care:  OP  Hospital Course:   Patient attended individual and group therapy while inpatient along with attending AA groups, one-one time with MD daily, medications for detox managed during inpatient, follow-up appointments made prior to discharge   Consults:  None  Significant Diagnostic Studies:  labs: Ordered in ED and reviewed  Discharge Vitals:   Blood pressure 103/57, pulse 96, temperature 97.7 F (36.5 C), temperature source Oral, resp. rate 16, height 6\' 2"  (1.88 m), weight 109.317 kg (241 lb).  Physical Findings: AIMS: Facial and Oral Movements Muscles of Facial Expression: None, normal Lips and Perioral Area: None, normal Jaw: None, normal Tongue: None, normal,Extremity Movements Upper (arms, wrists, hands, fingers): None, normal Lower (legs, knees, ankles, toes): None, normal, Trunk Movements Neck, shoulders, hips: None, normal, Overall Severity Severity of abnormal movements (highest score from questions above): None, normal Incapacitation  due to abnormal movements: None, normal Patient's awareness of abnormal movements (rate only patient's report): No Awareness, Dental Status Current problems with teeth and/or dentures?: No Does patient usually wear dentures?: No  CIWA:  CIWA-Ar Total: 0  COWS:  COWS Total Score: 1   Mental Status Exam: See Mental Status Examination and Suicide Risk Assessment completed by Attending Physician prior to discharge.  Discharge destination:  Home  Is patient on multiple antipsychotic therapies at discharge:  No   Has Patient had three or more failed trials of antipsychotic monotherapy by history:  NA Recommended Plan for Multiple Antipsychotic Therapies:  NA   Discharge Orders    Future Orders Please Complete By Expires   Diet - low sodium heart healthy      Increase activity slowly      Discharge instructions      Comments:   Take all of your medications as prescribed.  Be sure to keep ALL follow up appointments as scheduled. This is to ensure getting your refills on time to avoid any interruption in your medication.  If you find that you can not keep your appointment, call the clinic and reschedule. Be sure to tell the nurse if you will need a refill before your appointment.       Medication List     As of 11/22/2011  3:57 PM    STOP taking these medications         multivitamin with minerals Tabs      Prenatal Complete 14-0.4 MG Tabs      TAKE these medications  Indication    aspirin 81 MG tablet   Take 1 tablet (81 mg total) by mouth daily. For heart       atorvastatin 10 MG tablet   Commonly known as: LIPITOR   Take 1 tablet (10 mg total) by mouth daily. For increased cholesterol       FLUoxetine 20 MG capsule   Commonly known as: PROZAC   Take 1 capsule (20 mg total) by mouth daily. For depression/anxiety       hydrochlorothiazide 12.5 MG tablet   Commonly known as: HYDRODIURIL   Take 2 tablets (25 mg total) by mouth daily. For fluid/blood pressure        traMADol 50 MG tablet   Commonly known as: ULTRAM   Take 1 tablet (50 mg total) by mouth 2 (two) times daily as needed for pain.            Follow-up Information    Follow up with [facility]. (Call daily for bed availability)    Contact information:   [address] [place], [place] [address] [phone]      Follow up with [facility]. On 11/30/2011. (Appointment scheduled at 1:15 pm)       Follow up with [facility] . On 12/14/2011. (Arrive there at 8:00 am promptly!!)    Contact information:   [address]  [place], [place] [address] [phone]      Follow up with [facility] . On 11/23/2011. (Walk in on this date at 8:00 am)    Contact information:   [address], [place] [address] [phone]         Follow-up recommendations:  Activity as tolerated, low sodium heart healthy diet  Comments:  Patient denied suicidal/homicidal ideations and auditory/visual hallucinations, follow-up appointments encouraged to attend, outside support groups encouraged and information give , awaiting a [facility] date, encouraged the use of [facility] and [facility] for further support  Signed: Waylan Boga 11/22/2011, 3:57 PM

## 2011-11-22 NOTE — [facility] Suicide Risk Assessment (Signed)
Suicide Risk Assessment  Discharge Assessment     Demographic Factors:  Low socioeconomic status and Living alone  Mental Status Per Nursing Assessment::   On Admission:  NA  Current Mental Status by Physician: There are no suicidal ideas, plans or intent.  Loss Factors: Financial problems/change in socioeconomic status  Historical Factors: No contributing historical factors  Risk Reduction Factors:   Positive coping skills or problem solving skills  Continued Clinical Symptoms:  Depression:   Comorbid alcohol abuse/dependence  Cognitive Features That Contribute To Risk: None  Suicide Risk:  Minimal: No identifiable suicidal ideation.  Patients presenting with no risk factors but with morbid ruminations; may be classified as minimal risk based on the severity of the depressive symptoms  Discharge Diagnoses:   AXIS I:  Cocaine, Alcohol Abuse              Substance induced mood disorder AXIS II:  No diagnosis AXIS III:   Past Medical History  Diagnosis Date  . Hyperlipidemia   . Hypertension   . Hepatitis C   . Alcohol abuse     cocaine and tobacco  . Obesity   . Syphilis, secondary     treated  . Sebaceous cyst   . Chronic kidney disease     deemed secondary to HCTZ and lisinopril   AXIS IV:  economic problems, housing problems and problems with primary support group AXIS V:  61-70 mild symptoms  Plan Of Care/Follow-up recommendations:  Activity:  As tolerated Diet:  Regular He is going to get a bed at [facility] for alcohol/drug rehabilitation. Meanwhile he is going to stay at the shelter. He is going to go to meetings, be involved in the [employer]. He is going to pursue outpatient follow up with [facility].  Is patient on multiple antipsychotic therapies at discharge:  No   Has Patient had three or more failed trials of antipsychotic monotherapy by history:  No  Recommended Plan for Multiple Antipsychotic Therapies: N/A  Valiant Dills  A 11/22/2011, 11:00 AM

## 2011-11-22 NOTE — Progress Notes (Signed)
D: Patient denies SI/HI/AVH. Patient rates hopelessness as 1,  depression as 4, and anxiety as 1.  Patient affect is depressed. Mood is depressed.  Pt states that he is feeling better today because he "Can't stay negative. I feel something positive is going to happen." Patient did attend evening group. Patient visible on the milieu. No distress noted. A: Support and encouragement offered. Scheduled medications given to pt. Q 15 min checks continued for patient safety. R: Patient receptive. Patient remains safe on the unit.

## 2011-11-22 NOTE — Progress Notes (Addendum)
[facility] Case Management Discharge Plan:  Will you be returning to the same living situation after discharge: No. homeless and will seek shelter for housing At discharge, do you have transportation home?:Yes,  provided a bus pass Do you have the ability to pay for your medications:Yes,  access to meds   Release of information consent forms completed and in the chart;  Patient's signature needed at discharge.  Patient to Follow up at:  Follow-up Information    Follow up with [facility]. (Call daily for bed availability)    Contact information:   [address] [place], [place] [address] [phone]      Follow up with [facility]. On 11/30/2011. (Appointment scheduled at 1:15 pm)       Follow up with [facility] . On 12/14/2011. (Arrive there at 8:00 am promptly!!)    Contact information:   [address]  [place], [place] [address] [phone]      Follow up with [facility] . On 11/23/2011. (Walk in on this date at 8:00 am)    Contact information:   [address], [place] [address] [phone]         Patient denies SI/HI:   Yes,  denies SI/Hi    Safety Planning and Suicide Prevention discussed:  Yes,  discussed with pt today  Barrier to discharge identified:No.  Summary and Recommendations: Pt did not attend d/c planning group on this date.  SW met with pt individually at this time.   Pt presents with flat affect and depressed mood.  Pt denies SI/HI.  No recommendations from SW.  No further needs voiced by pt.  Pt stable to discharge.      Ane Payment 11/22/2011, 10:54 AM

## 2011-11-22 NOTE — Progress Notes (Signed)
[place] Group Notes:  (Counselor/Nursing/MHT/Case Management/Adjunct)  11/22/2011 2:14 PM  Type of Therapy:  Group Therapy  Participation Level:  None  Participation Quality:  Drowsy  Affect:  Flat  Cognitive:  Appropriate  Insight:  None  Engagement in Group:  None  Engagement in Therapy:  None  Modes of Intervention:  Clarification, Education, Problem-solving, Socialization and Support  Summary of Progress/Problems: The topic of discussion today was emotion regulation.  Pt did not participate in this group and slept for the majority of group discussion.    Ane Payment 11/22/2011, 2:14 PM

## 2011-11-22 NOTE — Tx Team (Signed)
Interdisciplinary Treatment Plan Update (Adult)  Date:  11/22/2011  Time Reviewed:  9:44 AM   Progress in Treatment: Attending groups: Yes Participating in groups:  Yes Taking medication as prescribed: Yes Tolerating medication:  Yes Family/Significant othe contact made:  Yes Patient understands diagnosis:  Yes Discussing patient identified problems/goals with staff:  Yes Medical problems stabilized or resolved:  Yes Denies suicidal/homicidal ideation: Yes Issues/concerns per patient self-inventory:  None identified Other: N/A  New problem(s) identified: None Identified  Reason for Continuation of Hospitalization: Stable to d/c  Interventions implemented related to continuation of hospitalization: Stable to d/c  Additional comments: N/A  Estimated length of stay: D/C today  Discharge Plan: Pt will follow up with [facility] and [facility] for further treatment.   New goal(s): N/A  Review of initial/current patient goals per problem list:    1.  Goal(s): Address substance use  Met:  Yes  Target date: by discharge  As evidenced by: completed detox protocol and referred to appropriate treatment  2.  Goal (s): Reduce depressive and anxiety symptoms  Met:  Yes  Target date: by discharge  As evidenced by: Reducing depression from a 10 to a 3 as reported by pt.  Pt rates at a 3 today.    Attendees: Patient:  Bruce Hayes  11/22/2011 9:44 AM   Family:     Physician:  Carlton Adam, MD 11/22/2011 9:44 AM   Nursing: Arna Snipe, RN 11/22/2011 9:44 AM   Clinical Social Worker:  Regan Lemming, [place] 11/22/2011 9:44 AM   Other: Eduard Roux, RN 11/22/2011 9:44 AM   Other:  Toula Moos, Psyc intern 11/22/2011 9:44 AM   Other:  Waylan Boga, NP 11/22/2011 9:45 AM   Other:     Other:      Scribe for Treatment Team:   Regan Lemming 11/22/2011 9:44 AM

## 2011-11-22 NOTE — Progress Notes (Signed)
Patient discharged by Cleotis Nipper RN. Patient denied SI/HI and psychosis. Given bus pass, AVS and prescriptions as well as a supply of medication. No further questions. Foster Simpson RN MS EdS 11/22/2011  2:40 PM

## 2011-11-24 NOTE — Progress Notes (Signed)
Patient Discharge Instructions:  After Visit Summary (AVS):   Faxed to:  11/24/11 Psychiatric Admission Assessment Note:   Faxed to:  11/24/11 Suicide Risk Assessment - Discharge Assessment:   Faxed to:  11/24/11 Faxed/Sent to the Next Level Care provider:  11/24/11 Faxed to [facility] @ [phone] Faxed to [facility] @ [phone] Faxed to [facility] @ [place], 11/24/2011, 3:57 PM

## 2011-11-27 ENCOUNTER — Inpatient Hospital Stay (HOSPITAL_COMMUNITY)
Admission: EM | Admit: 2011-11-27 | Discharge: 2011-11-30 | DRG: 683 | Disposition: A | Discharge status: Nursing Home (Includes ICF) | Payer: Medicaid Other | Attending: Internal Medicine | Admitting: Internal Medicine

## 2011-11-27 ENCOUNTER — Encounter (HOSPITAL_COMMUNITY): Payer: Self-pay | Admitting: Vascular Surgery

## 2011-11-27 DIAGNOSIS — F1020 Alcohol dependence, uncomplicated: Secondary | ICD-10-CM | POA: Diagnosis present

## 2011-11-27 DIAGNOSIS — Z9189 Other specified personal risk factors, not elsewhere classified: Secondary | ICD-10-CM

## 2011-11-27 DIAGNOSIS — F17200 Nicotine dependence, unspecified, uncomplicated: Secondary | ICD-10-CM

## 2011-11-27 DIAGNOSIS — R799 Abnormal finding of blood chemistry, unspecified: Secondary | ICD-10-CM

## 2011-11-27 DIAGNOSIS — Z886 Allergy status to analgesic agent status: Secondary | ICD-10-CM

## 2011-11-27 DIAGNOSIS — S5290XD Unspecified fracture of unspecified forearm, subsequent encounter for closed fracture with routine healing: Secondary | ICD-10-CM

## 2011-11-27 DIAGNOSIS — Z6832 Body mass index (BMI) 32.0-32.9, adult: Secondary | ICD-10-CM

## 2011-11-27 DIAGNOSIS — K769 Liver disease, unspecified: Secondary | ICD-10-CM

## 2011-11-27 DIAGNOSIS — M79609 Pain in unspecified limb: Secondary | ICD-10-CM

## 2011-11-27 DIAGNOSIS — E875 Hyperkalemia: Secondary | ICD-10-CM | POA: Diagnosis present

## 2011-11-27 DIAGNOSIS — L0291 Cutaneous abscess, unspecified: Secondary | ICD-10-CM

## 2011-11-27 DIAGNOSIS — E785 Hyperlipidemia, unspecified: Secondary | ICD-10-CM | POA: Diagnosis present

## 2011-11-27 DIAGNOSIS — Z5900 Homelessness unspecified: Secondary | ICD-10-CM

## 2011-11-27 DIAGNOSIS — D696 Thrombocytopenia, unspecified: Secondary | ICD-10-CM

## 2011-11-27 DIAGNOSIS — I2510 Atherosclerotic heart disease of native coronary artery without angina pectoris: Secondary | ICD-10-CM | POA: Diagnosis present

## 2011-11-27 DIAGNOSIS — E871 Hypo-osmolality and hyponatremia: Secondary | ICD-10-CM | POA: Diagnosis present

## 2011-11-27 DIAGNOSIS — Z8249 Family history of ischemic heart disease and other diseases of the circulatory system: Secondary | ICD-10-CM

## 2011-11-27 DIAGNOSIS — B181 Chronic viral hepatitis B without delta-agent: Secondary | ICD-10-CM

## 2011-11-27 DIAGNOSIS — Z87891 Personal history of nicotine dependence: Secondary | ICD-10-CM

## 2011-11-27 DIAGNOSIS — N179 Acute kidney failure, unspecified: Principal | ICD-10-CM | POA: Diagnosis present

## 2011-11-27 DIAGNOSIS — Z23 Encounter for immunization: Secondary | ICD-10-CM

## 2011-11-27 DIAGNOSIS — B1710 Acute hepatitis C without hepatic coma: Secondary | ICD-10-CM

## 2011-11-27 DIAGNOSIS — K709 Alcoholic liver disease, unspecified: Secondary | ICD-10-CM | POA: Diagnosis present

## 2011-11-27 DIAGNOSIS — E669 Obesity, unspecified: Secondary | ICD-10-CM

## 2011-11-27 DIAGNOSIS — I10 Essential (primary) hypertension: Secondary | ICD-10-CM | POA: Diagnosis present

## 2011-11-27 DIAGNOSIS — Z7982 Long term (current) use of aspirin: Secondary | ICD-10-CM

## 2011-11-27 DIAGNOSIS — I8500 Esophageal varices without bleeding: Secondary | ICD-10-CM

## 2011-11-27 DIAGNOSIS — Z79899 Other long term (current) drug therapy: Secondary | ICD-10-CM

## 2011-11-27 DIAGNOSIS — N189 Chronic kidney disease, unspecified: Secondary | ICD-10-CM

## 2011-11-27 DIAGNOSIS — S52121A Displaced fracture of head of right radius, initial encounter for closed fracture: Secondary | ICD-10-CM | POA: Diagnosis present

## 2011-11-27 DIAGNOSIS — I129 Hypertensive chronic kidney disease with stage 1 through stage 4 chronic kidney disease, or unspecified chronic kidney disease: Secondary | ICD-10-CM | POA: Diagnosis present

## 2011-11-27 DIAGNOSIS — F1410 Cocaine abuse, uncomplicated: Secondary | ICD-10-CM | POA: Diagnosis present

## 2011-11-27 DIAGNOSIS — B1920 Unspecified viral hepatitis C without hepatic coma: Secondary | ICD-10-CM | POA: Diagnosis present

## 2011-11-27 HISTORY — DX: Atherosclerotic heart disease of native coronary artery without angina pectoris: I25.10

## 2011-11-27 NOTE — ED Notes (Addendum)
Pt reports to the ED for eval of right arm pain he broke about a month and a half ago. CMS intact. Pt can wiggle fingers. He also reports right hip pain from the same fall about a month and a half ago. States that he has been taking BC powder because he cannot get pain medicine because he does not have the money. Also reports sinus congestion, states that when he talks it sounds as though it is coming out of his ears. Also reports increasing weakness and decreased appetite for the past 3 days.

## 2011-11-28 ENCOUNTER — Emergency Department (HOSPITAL_COMMUNITY): Payer: Medicaid Other

## 2011-11-28 ENCOUNTER — Encounter (HOSPITAL_COMMUNITY): Payer: Self-pay | Admitting: Internal Medicine

## 2011-11-28 DIAGNOSIS — N179 Acute kidney failure, unspecified: Principal | ICD-10-CM

## 2011-11-28 DIAGNOSIS — E875 Hyperkalemia: Secondary | ICD-10-CM | POA: Diagnosis present

## 2011-11-28 DIAGNOSIS — I10 Essential (primary) hypertension: Secondary | ICD-10-CM

## 2011-11-28 LAB — COMPREHENSIVE METABOLIC PANEL
ALT: 63 U/L — ABNORMAL HIGH (ref 0–53)
AST: 111 U/L — ABNORMAL HIGH (ref 0–37)
Alkaline Phosphatase: 112 U/L (ref 39–117)
CO2: 20 mEq/L (ref 19–32)
CO2: 18 mEq/L — ABNORMAL LOW (ref 19–32)
Calcium: 8.4 mg/dL (ref 8.4–10.5)
Calcium: 8.7 mg/dL (ref 8.4–10.5)
Chloride: 111 mEq/L (ref 96–112)
Creatinine, Ser: 1.92 mg/dL — ABNORMAL HIGH (ref 0.50–1.35)
GFR calc Af Amer: 41 mL/min — ABNORMAL LOW (ref >90)
GFR calc Af Amer: 42 mL/min — ABNORMAL LOW (ref >90)
GFR calc non Af Amer: 35 mL/min — ABNORMAL LOW (ref >90)
GFR calc non Af Amer: 36 mL/min — ABNORMAL LOW (ref >90)
Glucose, Bld: 200 mg/dL — ABNORMAL HIGH (ref 70–99)
Glucose, Bld: 96 mg/dL (ref 70–99)
Sodium: 142 mEq/L (ref 135–145)
Total Bilirubin: 1.1 mg/dL (ref 0.3–1.2)

## 2011-11-28 LAB — BASIC METABOLIC PANEL
BUN: 28 mg/dL — ABNORMAL HIGH (ref 6–23)
CO2: 24 mEq/L (ref 19–32)
Glucose, Bld: 95 mg/dL (ref 70–99)
Potassium: 3.8 mEq/L (ref 3.5–5.1)
Sodium: 138 mEq/L (ref 135–145)

## 2011-11-28 LAB — CBC
HCT: 41.8 % (ref 39.0–52.0)
MCHC: 35.2 g/dL (ref 30.0–36.0)
MCV: 85 fL (ref 78.0–100.0)
Platelets: 181 10*3/uL (ref 150–400)
RDW: 15.1 % (ref 11.5–15.5)

## 2011-11-28 LAB — URINALYSIS, ROUTINE W REFLEX MICROSCOPIC
Hgb urine dipstick: NEGATIVE
Protein, ur: NEGATIVE mg/dL
Urobilinogen, UA: 2 mg/dL — ABNORMAL HIGH (ref 0.0–1.0)

## 2011-11-28 LAB — RAPID URINE DRUG SCREEN, HOSP PERFORMED
Barbiturates: NOT DETECTED
Benzodiazepines: POSITIVE — AB

## 2011-11-28 LAB — HEMOGLOBIN A1C: Mean Plasma Glucose: 117 mg/dL — ABNORMAL HIGH (ref <117)

## 2011-11-28 LAB — CBC WITH DIFFERENTIAL/PLATELET
Basophils Absolute: 0.1 10*3/uL (ref 0.0–0.1)
Basophils Relative: 1 % (ref 0–1)
Eosinophils Absolute: 0.3 10*3/uL (ref 0.0–0.7)
Lymphocytes Relative: 48 % — ABNORMAL HIGH (ref 12–46)
MCH: 30.1 pg (ref 26.0–34.0)
MCHC: 35.8 g/dL (ref 30.0–36.0)
Monocytes Absolute: 1.4 10*3/uL — ABNORMAL HIGH (ref 0.1–1.0)
Neutrophils Relative %: 38 % — ABNORMAL LOW (ref 43–77)
Platelets: 129 10*3/uL — ABNORMAL LOW (ref 150–400)
RDW: 13.9 % (ref 11.5–15.5)

## 2011-11-28 LAB — CK: Total CK: 197 U/L (ref 7–232)

## 2011-11-28 LAB — URINE MICROSCOPIC-ADD ON

## 2011-11-28 LAB — LACTIC ACID, PLASMA: Lactic Acid, Venous: 1.1 mmol/L (ref 0.5–2.2)

## 2011-11-28 LAB — TROPONIN I: Troponin I: <0.3 ng/mL (ref <0.30)

## 2011-11-28 LAB — CORTISOL: Cortisol, Plasma: 16.9 ug/dL

## 2011-11-28 MED ORDER — OXYCODONE HCL 5 MG PO TABS
5.0000 mg | ORAL_TABLET | Freq: Four times a day (QID) | ORAL | Status: DC | PRN
  Administered 2011-11-28 – 2011-11-30 (×4): 5 mg via ORAL
  Filled 2011-11-28 (×4): qty 1

## 2011-11-28 MED ORDER — OXYMETAZOLINE HCL 0.05 % NA SOLN
1.0000 | Freq: Once | NASAL | Status: AC
  Administered 2011-11-28: 1 via NASAL
  Filled 2011-11-28: qty 15

## 2011-11-28 MED ORDER — SODIUM CHLORIDE 0.9 % IV SOLN
INTRAVENOUS | Status: DC
  Administered 2011-11-28 (×2): via INTRAVENOUS

## 2011-11-28 MED ORDER — VITAMIN B-1 50 MG PO TABS
50.0000 mg | ORAL_TABLET | Freq: Every day | ORAL | Status: DC
  Administered 2011-11-28 – 2011-11-30 (×3): 50 mg via ORAL
  Filled 2011-11-28 (×3): qty 1

## 2011-11-28 MED ORDER — SODIUM CHLORIDE 0.9 % IV BOLUS (SEPSIS)
1000.0000 mL | Freq: Once | INTRAVENOUS | Status: AC
  Administered 2011-11-28: 1000 mL via INTRAVENOUS

## 2011-11-28 MED ORDER — ONDANSETRON HCL 4 MG PO TABS: 4.0000 mg | ORAL_TABLET | Freq: Four times a day (QID) | ORAL | Status: DC | PRN

## 2011-11-28 MED ORDER — SODIUM CHLORIDE 0.9 % IV SOLN: INTRAVENOUS | Status: DC

## 2011-11-28 MED ORDER — HYDRALAZINE HCL 20 MG/ML IJ SOLN
10.0000 mg | INTRAMUSCULAR | Status: DC | PRN
  Filled 2011-11-28: qty 0.5

## 2011-11-28 MED ORDER — INSULIN ASPART 100 UNIT/ML ~~LOC~~ SOLN
5.0000 [IU] | Freq: Once | SUBCUTANEOUS | Status: AC
  Administered 2011-11-28: 5 [IU] via INTRAVENOUS
  Filled 2011-11-28: qty 1

## 2011-11-28 MED ORDER — AMLODIPINE BESYLATE 10 MG PO TABS
10.0000 mg | ORAL_TABLET | Freq: Every day | ORAL | Status: DC
  Administered 2011-11-28 – 2011-11-30 (×3): 10 mg via ORAL
  Filled 2011-11-28 (×3): qty 1

## 2011-11-28 MED ORDER — PROSIGHT PO TABS
1.0000 | ORAL_TABLET | Freq: Every day | ORAL | Status: DC
  Administered 2011-11-28 – 2011-11-30 (×3): 1 via ORAL
  Filled 2011-11-28 (×3): qty 1

## 2011-11-28 MED ORDER — SODIUM CHLORIDE 0.9 % IJ SOLN
3.0000 mL | Freq: Two times a day (BID) | INTRAMUSCULAR | Status: DC
  Administered 2011-11-28 – 2011-11-30 (×5): 3 mL via INTRAVENOUS

## 2011-11-28 MED ORDER — FOLIC ACID 1 MG PO TABS
1.0000 mg | ORAL_TABLET | Freq: Every day | ORAL | Status: DC
  Administered 2011-11-28 – 2011-11-30 (×3): 1 mg via ORAL
  Filled 2011-11-28 (×3): qty 1

## 2011-11-28 MED ORDER — SODIUM POLYSTYRENE SULFONATE 15 GM/60ML PO SUSP
30.0000 g | Freq: Once | ORAL | Status: AC
  Administered 2011-11-28: 30 g via ORAL
  Filled 2011-11-28: qty 120

## 2011-11-28 MED ORDER — ONDANSETRON HCL 4 MG/2ML IJ SOLN: 4.0000 mg | Freq: Four times a day (QID) | INTRAMUSCULAR | Status: DC | PRN

## 2011-11-28 MED ORDER — DEXTROSE 50 % IV SOLN
50.0000 mL | Freq: Once | INTRAVENOUS | Status: AC
  Administered 2011-11-28: 50 mL via INTRAVENOUS
  Filled 2011-11-28: qty 50

## 2011-11-28 NOTE — ED Notes (Signed)
Informed patient that we need a urine sample for testing. Pt states that he cannot void at this time. Urinal provided at bedside
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
## 2011-11-28 NOTE — Care Management Note (Signed)
    Page 1 of 2   11/30/2011     11:33:12 AM   CARE MANAGEMENT NOTE 11/30/2011  Patient:  Bruce Hayes, Bruce Hayes   Account Number:  [id number]  Date Initiated:  11/28/2011  Documentation initiated by:  Tomi Bamberger  Subjective/Objective Assessment:   dx acute renal failure  admit-pt is homeless (shelter)     Action/Plan:   ALF- [facility]   Anticipated DC Date:  11/30/2011   Anticipated DC Plan:  ASSISTED LIVING / REST HOME  In-house referral  Clinical Social Worker      DC Forensic scientist  CM consult      Choice offered to / List presented to:             Status of service:  Completed, signed off Medicare Important Message given?   (If response is "NO", the following Medicare IM given date fields will be blank) Date Medicare IM given:   Date Additional Medicare IM given:    Discharge Disposition:  ASSISTED LIVING  Per UR Regulation:  Reviewed for med. necessity/level of care/duration of stay  If discussed at [place] of Stay Meetings, dates discussed:    Comments:  11/30/11 11:18 Tomi Bamberger RN, BSN [phone] patient for dc to [facility] ALF today, patient has an appt across the st at 1:15, CSW will see if ALF transportation will be able to take patient across the st for this appt. Patient has a f/u apt at [facility] on 11/14 at 9:30, patient informed.  11/29/11 16:08 Tomi Bamberger RN, BSN [phone] Patient will be dc tomorrow to [place], they will help him to get his medicaid MD switched to a MD here in [place].  For f/u patient [facility] will contact patient after the first when he has minutes on his phone to schedule a follow up appt in [place]. Informed CSW that he will need medicaid transport set up.  11/28/11 12:15 Loyal Buba RN, BSN [phone] patient  is homeless, CSW is working on ALF for patient. Patient for possible dc tomorrow. Patient  has medication coverage.  Patient will need transportation  at dc.

## 2011-11-28 NOTE — H&P (Signed)
Bruce Hayes is an 66 y.o. male.   Patient was seen and examined on November 28, 2011. PCP - none. Used to go to a clinic in [place]. Patient had just recently moved to [place]. Chief Complaint: Weakness. HPI: 66 year old male with history of hypertension, hyperlipidemia, cocaine abuse and recently discharged from behavioral [place] after being treated for alcoholism and cocaine abuse 2 weeks ago presents to the ER with complaints of weakness. Patient states he's been feeling very weak last 2 days with fatigue. Denies any fever chills or nausea vomiting shortness of breath or chest pain. In the ER patient was noticed to have hyperkalemia with potassium more than 9 and was rechecked was found to be on 6.3. EKG did not show any acute changes. Patient was given Kayexalate 30 g with D50 and insulin. Patient has been admitted for further management. Patient has not been taking his antihypertensives for almost few weeks now. Still has been using cocaine but has not been drinking alcohol for many weeks.  Past Medical History  Diagnosis Date  . Hyperlipidemia   . Hypertension   . Hepatitis C   . Alcohol abuse     cocaine and tobacco  . Obesity   . Syphilis, secondary     treated  . Sebaceous cyst   . Chronic kidney disease     deemed secondary to HCTZ and lisinopril  . Coronary artery disease     History reviewed. No pertinent past surgical history.  Family History  Problem Relation Age of Onset  . Asthma Mother   . Arthritis Mother   . Coronary artery disease Mother   . Cancer Father     pancreatic cancer  . Coronary artery disease Daughter     questionable   Social History:  reports that he quit smoking about 4 years ago. His smoking use included Cigarettes. He does not have any smokeless tobacco history on file. He reports that he drinks alcohol. He reports that he uses illicit drugs (Cocaine).  Allergies:  Allergies  Allergen Reactions  . Tylenol (Acetaminophen)  Other (See Comments)    Liver condition    Medications Prior to Admission  Medication Sig Dispense Refill  . aspirin 81 MG tablet Take 1 tablet (81 mg total) by mouth daily. For heart      . atorvastatin (LIPITOR) 10 MG tablet Take 1 tablet (10 mg total) by mouth daily. For increased cholesterol      . FLUoxetine (PROZAC) 20 MG capsule Take 1 capsule (20 mg total) by mouth daily. For depression/anxiety  30 capsule  0  . hydrochlorothiazide (HYDRODIURIL) 12.5 MG tablet Take 2 tablets (25 mg total) by mouth daily. For fluid/blood pressure  30 tablet  1  . traMADol (ULTRAM) 50 MG tablet Take 1 tablet (50 mg total) by mouth 2 (two) times daily as needed for pain.  20 tablet  0    Results for orders placed during the hospital encounter of 11/27/11 (from the past 48 hour(s))  CBC     Status: Normal   Collection Time   11/28/11 12:07 AM      Component Value Range Comment   WBC 10.4  4.0 - 10.5 K/uL    RBC 4.92  4.22 - 5.81 MIL/uL    Hemoglobin 14.7  13.0 - 17.0 g/dL    HCT 41.8  39.0 - 52.0 %    MCV 85.0  78.0 - 100.0 fL    MCH 29.9  26.0 - 34.0 pg  MCHC 35.2  30.0 - 36.0 g/dL    RDW 15.1  11.5 - 15.5 %    Platelets 181  150 - 400 K/uL   POTASSIUM     Status: Abnormal   Collection Time   11/28/11 12:25 AM      Component Value Range Comment   Potassium 6.3 (*) 3.5 - 5.1 mEq/L   URINALYSIS, ROUTINE W REFLEX MICROSCOPIC     Status: Abnormal   Collection Time   11/28/11  2:00 AM      Component Value Range Comment   Color, Urine AMBER (*) YELLOW BIOCHEMICALS MAY BE AFFECTED BY COLOR   APPearance CLEAR  CLEAR    Specific Gravity, Urine 1.023  1.005 - 1.030    pH 5.5  5.0 - 8.0    Glucose, UA NEGATIVE  NEGATIVE mg/dL    Hgb urine dipstick NEGATIVE  NEGATIVE    Bilirubin Urine SMALL (*) NEGATIVE    Ketones, ur 15 (*) NEGATIVE mg/dL    Protein, ur NEGATIVE  NEGATIVE mg/dL    Urobilinogen, UA 2.0 (*) 0.0 - 1.0 mg/dL    Nitrite NEGATIVE  NEGATIVE    Leukocytes, UA SMALL (*) NEGATIVE     URINE MICROSCOPIC-ADD ON     Status: Abnormal   Collection Time   11/28/11  2:00 AM      Component Value Range Comment   Squamous Epithelial / LPF RARE  RARE    WBC, UA 3-6  <3 WBC/hpf    RBC / HPF 0-2  <3 RBC/hpf    Bacteria, UA RARE  RARE    Casts HYALINE CASTS (*) NEGATIVE   COMPREHENSIVE METABOLIC PANEL     Status: Abnormal   Collection Time   11/28/11  2:03 AM      Component Value Range Comment   Sodium 139  135 - 145 mEq/L    Potassium 4.1  3.5 - 5.1 mEq/L    Chloride 107  96 - 112 mEq/L    CO2 20  19 - 32 mEq/L    Glucose, Bld 200 (*) 70 - 99 mg/dL    BUN 34 (*) 6 - 23 mg/dL    Creatinine, Ser 1.92 (*) 0.50 - 1.35 mg/dL    Calcium 8.4  8.4 - 10.5 mg/dL    Total Protein 7.9  6.0 - 8.3 g/dL    Albumin 2.9 (*) 3.5 - 5.2 g/dL    AST 105 (*) 0 - 37 U/L    ALT 58 (*) 0 - 53 U/L    Alkaline Phosphatase 112  39 - 117 U/L    Total Bilirubin 1.2  0.3 - 1.2 mg/dL    GFR calc non Af Amer 35 (*) >90 mL/min    GFR calc Af Amer 41 (*) >90 mL/min    Dg Chest 2 View  11/28/2011  *RADIOLOGY REPORT*  Clinical Data: Shortness of breath.  Right arm pain.  CHEST - 2 VIEW  Comparison: None available.  Findings: The heart size is normal.  The lung volumes are low.  No focal airspace disease is evident.  Mild degenerative changes are noted in the thoracic spine.  IMPRESSION:  1.  Low lung volumes. 2.  No acute cardiopulmonary disease.   Original Report Authenticated By: Resa Miner. MATTERN, M.D.     Review of Systems  Constitutional: Positive for malaise/fatigue.  HENT: Negative.   Eyes: Negative.   Respiratory: Negative.   Cardiovascular: Negative.   Gastrointestinal: Negative.   Genitourinary: Negative.   Musculoskeletal:  Negative.   Skin: Negative.   Neurological: Positive for weakness.  Endo/Heme/Allergies: Negative.   Psychiatric/Behavioral: Negative.     Blood pressure 182/79, pulse 86, temperature 98.6 F (37 C), temperature source Oral, resp. rate 16, height 5\' 11"   (1.803 m), weight 106 kg (233 lb 11 oz), SpO2 100.00%. Physical Exam  Constitutional: He is oriented to person, place, and time. He appears well-developed and well-nourished. No distress.  HENT:  Head: Normocephalic and atraumatic.  Right Ear: External ear normal.  Left Ear: External ear normal.  Nose: Nose normal.  Mouth/Throat: Oropharynx is clear and moist. No oropharyngeal exudate.  Eyes: Conjunctivae normal are normal. Pupils are equal, round, and reactive to light. Right eye exhibits no discharge. Left eye exhibits no discharge. No scleral icterus.  Neck: Normal range of motion. Neck supple.  Cardiovascular: Normal rate and regular rhythm.   Respiratory: Effort normal and breath sounds normal. No respiratory distress. He has no wheezes. He has no rales.  GI: Soft. Bowel sounds are normal. He exhibits no distension. There is no tenderness. There is no rebound.  Musculoskeletal:       Right hand wrist swollen.  Neurological: He is alert and oriented to person, place, and time.       Moves all extremities.  Skin: Skin is warm and dry. He is not diaphoretic.     Assessment/Plan #1. Acute renal failure with hyperkalemia - patient's last creatinine 2 weeks ago was 1.3. Today on admission it was 2.2 with hyperkalemia. Not sure what caused hyponatremia. Since patient is complaining of weakness we will check for adrenal insufficiency by checking cortisol levels. Check CK levels for any rhabdomyolysis. Closely follow intake output metabolic panel. UA is unremarkable. Continue to hydrate. Patient did receive fluid bolus in the ER. #2. Cocaine abuse - patient admits taking cocaine 2 days ago. Advised to quit this habit. #3. Hypertension - patient does not take his antihypertensives at home. At this time I have placed patient on when necessary IV hydralazine for systolic blood pressure more than 160. #4. History of hepatitis C and B. - further workup as outpatient. Check LFTs. #5. History of  hyperlipidemia - if LFTs and CK levels are normal then may continue his statins.   CODE STATUS - full code.  Marshawn Ninneman N. 11/28/2011, 4:29 AM

## 2011-11-28 NOTE — Clinical Social Work Psychosocial (Signed)
     Clinical Social Work Department BRIEF PSYCHOSOCIAL ASSESSMENT 11/28/2011  Patient:  Bruce Hayes, Bruce Hayes     Account Number:  [id number]     Admit date:  11/27/2011  Clinical Social Worker:  Daiva Huge  Date/Time:  11/28/2011 01:02 PM  Referred by:  Physician  Date Referred:  11/28/2011 Referred for  Homelessness   Other Referral:   Interview type:  Other - See comment Other interview type:    PSYCHOSOCIAL DATA Living Status:  ALONE Admitted from facility:   Level of care:   Primary support name:  MINIMAL Primary support relationship to patient:   Degree of support available:   MINIMAL    CURRENT CONCERNS Current Concerns  Post-Acute Placement   Other Concerns:   GRIEF  SUBSTANCE USE  HOMELESSNESS    SOCIAL WORK ASSESSMENT / PLAN patient reports homelessness for 4 months- he recently moved from [facility] here and states he went downhill after moving here- began drinking and using cocaine again after 19 months of being clean- Patient reports that he is still grieving the death of his mother and this was part of his relapse- being homeless has aldo influenced his substance use and he is wanting to pursue ALF at d/c- he is aware that the ALF will take his SS check and [facility] will cove the rest-   Assessment/plan status:  Other - See comment Other assessment/ plan:   FL2 and PASARR completion for ALF search-   Information/referral to community resources:   ALF  [facility]    PATIENTS/FAMILYS RESPONSE TO PLAN OF CARE: Patient agreeable to CSW visit and support-

## 2011-11-28 NOTE — Progress Notes (Signed)
Patient ID: Bruce Hayes, male   DOB: 01-24-1946, 66 y.o.   MRN: [id number] [facility] PROGRESS NOTE  DONIVIN KOSTERMAN [id number] DOB: 20-May-1945 DOA: 11/27/2011 PCP: William Hamburger, MD  Assessment/Plan 66 year old male with history of hypertension, hyperlipidemia, cocaine abuse and recently discharged from behavioral Health (10/23) after being treated for alcoholism and cocaine abuse 2 weeks ago presents to the ER with complaints of weakness. Patient states he's been feeling very weak last 2 days with fatigue. Denies any fever chills or nausea vomiting shortness of breath or chest pain. In the ER patient was noticed to have hyperkalemia with potassium more than 9 and was rechecked was found to be on 6.3. EKG did not show any acute changes. Patient was given Kayexalate 30 g with D50 and insulin. Patient has been admitted for further management. Patient has not been taking his antihypertensives for almost few weeks now. Still has been using cocaine but has not been drinking alcohol for many weeks.  Acute Renal Failure Possibly cocaine/alcohol induced.  CK level wnl, cortisol level pending. Baseline creatinine 1.2  Received IVF - now d/c'd due to chronic liver disease.  Hyperkalemia 2/2 ARF Resolved with Kayexalate, D50, insulin and IVF  Continued cocaine abuse Patient counseled. Social work consultation  HTN BP elevated.   HCTZ being held due to ARF.   Start amlodipine 10 mg IVF d/c.  Will monitor.   Consider BP med at discharge - will need PCP as well.  Transaminitis Alcohol induced pattern. Patient has history of chronic liver disease with Hep C No encephalopathy Check weights, low sodium diet, monitor.  Right distal radius fracture (11/09/11) Non-displaced. Seeing orthopedic as an outpatient. Some swelling noted at site.  Code Status: full Family Communication: spoke with patient. Disposition Plan: TBA, will likely d/c to ALF when appropriate.  (11/29/2011)  Antibiotics:  none  HPI/Subjective:   Objective: Filed Vitals:   11/27/11 2300 11/28/11 0335  BP: 141/70 182/79  Pulse:  86  Temp: 98.3 F (36.8 C) 98.6 F (37 C)  TempSrc: Oral Oral  Resp: 18 16  Height:  5\' 11"  (1.803 m)  Weight:  106 kg (233 lb 11 oz)  SpO2: 96% 100%    Intake/Output Summary (Last 24 hours) at 11/28/11 1134 Last data filed at 11/28/11 0900  Gross per 24 hour  Intake 334.67 ml  Output      0 ml  Net 334.67 ml   Filed Weights   11/28/11 0335  Weight: 106 kg (233 lb 11 oz)    Exam:   General:  A&O lying in bed, nad  Cardiovascular: RRR no M/R/G  Respiratory: CTA no W/C/R  Abdomen: obese, soft, nt, active bowel sounds, I do not appreciate organomegaly.  Ext.  No lower extremity edema.  Data Reviewed: Basic Metabolic Panel:  Lab [id number] 11/28/11 0203 11/28/11 0025  NA 142 139 --  K 4.7 4.1 6.3*  CL 111 107 --  CO2 18* 20 --  GLUCOSE 96 200* --  BUN 33* 34* --  CREATININE 1.89* 1.92* --  CALCIUM 8.7 8.4 --  MG -- -- --  PHOS -- -- --   Liver Function Tests:  Lab 11/28/11 0425 11/28/11 0203  AST 111* 105*  ALT 63* 58*  ALKPHOS 112 112  BILITOT 1.1 1.2  PROT 8.4* 7.9  ALBUMIN 3.0* 2.9*   CBC:  Lab 11/28/11 0425 11/28/11 0007  WBC 13.1* 10.4  NEUTROABS 5.0 --  HGB 14.0 14.7  HCT 39.1 41.8  MCV 84.1  85.0  PLT 129* 181   Cardiac Enzymes:  Lab 11/28/11 0425  CKTOTAL 197  CKMB --  CKMBINDEX --  TROPONINI <0.30   BNP (last 3 results)  Basename 10/30/11 1842  PROBNP 75.7     Studies: Dg Chest 2 View  11/28/2011  *RADIOLOGY REPORT*  Clinical Data: Shortness of breath.  Right arm pain.  CHEST - 2 VIEW  Comparison: None available.  Findings: The heart size is normal.  The lung volumes are low.  No focal airspace disease is evident.  Mild degenerative changes are noted in the thoracic spine.  IMPRESSION:  1.  Low lung volumes. 2.  No acute cardiopulmonary disease.   Original Report Authenticated  By: Resa Miner. MATTERN, M.D.     Scheduled Meds:   . dextrose  50 mL Intravenous Once  . insulin aspart  5 Units Intravenous Once  . oxymetazoline  1 spray Each Nare Once  . sodium chloride  1,000 mL Intravenous Once  . sodium chloride  3 mL Intravenous Q12H  . sodium polystyrene  30 g Oral Once  . DISCONTD: sodium chloride   Intravenous STAT   Continuous Infusions:   . DISCONTD: sodium chloride 125 mL/hr at 11/28/11 [id number]    Principal Problem:  *Acute renal failure Active Problems:  HYPERTENSION  COCAINE ABUSE, HX OF  Hyperkalemia    Time spent: 30 min.    Melton Alar  [facility] Pager [phone]. If 8PM-8AM, please contact night-coverage at [email or web address], password [facility] 11/28/2011, 11:34 AM  LOS: 1 day    Attending Patient seen and examined, agree with above documentation. Patient was admitted with acute renal failure and hyperkalemia. Hyponatremia has now resolved, acute renal failure significantly better with hydration. Patient has ongoing cocaine use, he claims that he has not had a drink in the past month or so. He has no signs of withdrawal. He is homeless, he sustained a fall a couple of months ago as fractured his right forearm. Current disposition is to discharge him to an assisted living facility when a bed is available.  S Ghimire

## 2011-11-28 NOTE — ED Notes (Addendum)
Chem 8 results Na  134 mmol/L K   >9.0 mmol/L Cl  114 mmol/L iCa  0.83 mmo/L TC02  23 mmol/L Glu  96 mg/dL BUN  65 Mg/dL Crea  2.2 mg/dL Hct  45%PCV Hb  15.3 g/dL

## 2011-11-29 DIAGNOSIS — Z9189 Other specified personal risk factors, not elsewhere classified: Secondary | ICD-10-CM

## 2011-11-29 DIAGNOSIS — M79609 Pain in unspecified limb: Secondary | ICD-10-CM

## 2011-11-29 DIAGNOSIS — L0291 Cutaneous abscess, unspecified: Secondary | ICD-10-CM

## 2011-11-29 LAB — COMPREHENSIVE METABOLIC PANEL
ALT: 60 U/L — ABNORMAL HIGH (ref 0–53)
AST: 101 U/L — ABNORMAL HIGH (ref 0–37)
Alkaline Phosphatase: 104 U/L (ref 39–117)
CO2: 23 mEq/L (ref 19–32)
Chloride: 104 mEq/L (ref 96–112)
GFR calc Af Amer: 59 mL/min — ABNORMAL LOW (ref >90)
GFR calc non Af Amer: 51 mL/min — ABNORMAL LOW (ref >90)
Glucose, Bld: 86 mg/dL (ref 70–99)
Sodium: 136 mEq/L (ref 135–145)
Total Bilirubin: 1 mg/dL (ref 0.3–1.2)

## 2011-11-29 LAB — CBC
MCH: 29.5 pg (ref 26.0–34.0)
MCHC: 35.2 g/dL (ref 30.0–36.0)
Platelets: 114 10*3/uL — ABNORMAL LOW (ref 150–400)
RDW: 13.6 % (ref 11.5–15.5)

## 2011-11-29 MED ORDER — TUBERCULIN PPD 5 UNIT/0.1ML ID SOLN
5.0000 [IU] | Freq: Once | INTRADERMAL | Status: AC
  Administered 2011-11-29: 5 [IU] via INTRADERMAL
  Filled 2011-11-29: qty 0.1

## 2011-11-29 MED ORDER — ATORVASTATIN CALCIUM 10 MG PO TABS: 10.0000 mg | ORAL_TABLET | Freq: Every day | ORAL | Status: DC

## 2011-11-29 MED ORDER — AMLODIPINE BESYLATE 10 MG PO TABS: 10.0000 mg | ORAL_TABLET | Freq: Every day | ORAL | Status: DC

## 2011-11-29 MED ORDER — FLUOXETINE HCL 20 MG PO CAPS: 20.0000 mg | ORAL_CAPSULE | Freq: Every day | ORAL | Status: DC

## 2011-11-29 MED ORDER — PROSIGHT PO TABS: 1.0000 | ORAL_TABLET | Freq: Every day | ORAL | Status: DC

## 2011-11-29 MED ORDER — TRAMADOL HCL 50 MG PO TABS: 50.0000 mg | ORAL_TABLET | Freq: Two times a day (BID) | ORAL | Status: DC | PRN

## 2011-11-29 MED ORDER — INFLUENZA VIRUS VACC SPLIT PF IM SUSP
0.5000 mL | INTRAMUSCULAR | Status: AC
  Administered 2011-11-30: 0.5 mL via INTRAMUSCULAR
  Filled 2011-11-29: qty 0.5

## 2011-11-29 MED ORDER — PNEUMOCOCCAL VAC POLYVALENT 25 MCG/0.5ML IJ INJ
0.5000 mL | INJECTION | INTRAMUSCULAR | Status: AC
  Administered 2011-11-30: 0.5 mL via INTRAMUSCULAR
  Filled 2011-11-29: qty 0.5

## 2011-11-29 NOTE — Progress Notes (Signed)
TB skin test placed on patients right forearm.  To be read in 48hours.

## 2011-11-29 NOTE — Progress Notes (Signed)
[facility] has visited patient and will accept patient tomorrow- patient pleased- PPD skin test to be placed for ALF-  Eduard Clos, MSW, [employer] [phone]

## 2011-11-29 NOTE — Discharge Summary (Signed)
Agree with assessment and plan Berma Harts A. Giulietta Prokop, M.D. 

## 2011-11-29 NOTE — Discharge Summary (Signed)
Addendum  Patient seen and examined, chart and data base reviewed.  I agree with the above assessment and discharge plan.  For full details please see Mrs. Imogene Burn PA. Note.  Acute renal failure, resolving. Hypokalemia resolved   Birdie Hopes, MD [facility] Pager: [phone] 11/29/2011, 4:12 PM
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
## 2011-11-30 DIAGNOSIS — S52123A Displaced fracture of head of unspecified radius, initial encounter for closed fracture: Secondary | ICD-10-CM

## 2011-11-30 DIAGNOSIS — D696 Thrombocytopenia, unspecified: Secondary | ICD-10-CM

## 2011-11-30 DIAGNOSIS — S52121A Displaced fracture of head of right radius, initial encounter for closed fracture: Secondary | ICD-10-CM | POA: Diagnosis present

## 2011-11-30 NOTE — Progress Notes (Signed)
Patient accepted at [facility] and was provided bus passes to get to Ortho appointment and then to the ALF. Patient pleased with this option and is appreciative of all the help and support.   Eduard Clos, MSW, Latanya Presser [phone]

## 2011-11-30 NOTE — Discharge Summary (Signed)
Addendum  Patient seen and examined, chart and data base reviewed.  I agree with the above assessment and discharge plan.  For full details please see Mrs. Imogene Burn PA. Note.  Acute renal failure improving, hyperkalemia resolved.   Birdie Hopes, MD [facility] Pager: [phone] 11/30/2011, 10:20 AM

## 2012-01-02 ENCOUNTER — Encounter (HOSPITAL_COMMUNITY): Payer: Self-pay | Admitting: Neurology

## 2012-01-02 ENCOUNTER — Emergency Department (HOSPITAL_COMMUNITY): Payer: Medicaid Other

## 2012-01-02 ENCOUNTER — Emergency Department (HOSPITAL_COMMUNITY)
Admission: EM | Admit: 2012-01-02 | Discharge: 2012-01-02 | Disposition: A | Discharge status: Home / Self Care | Payer: Medicaid Other | Attending: Emergency Medicine | Admitting: Emergency Medicine

## 2012-01-02 DIAGNOSIS — M25462 Effusion, left knee: Secondary | ICD-10-CM

## 2012-01-02 DIAGNOSIS — Z7982 Long term (current) use of aspirin: Secondary | ICD-10-CM | POA: Insufficient documentation

## 2012-01-02 DIAGNOSIS — E669 Obesity, unspecified: Secondary | ICD-10-CM | POA: Insufficient documentation

## 2012-01-02 DIAGNOSIS — I129 Hypertensive chronic kidney disease with stage 1 through stage 4 chronic kidney disease, or unspecified chronic kidney disease: Secondary | ICD-10-CM | POA: Insufficient documentation

## 2012-01-02 DIAGNOSIS — F17200 Nicotine dependence, unspecified, uncomplicated: Secondary | ICD-10-CM | POA: Insufficient documentation

## 2012-01-02 DIAGNOSIS — N189 Chronic kidney disease, unspecified: Secondary | ICD-10-CM | POA: Insufficient documentation

## 2012-01-02 DIAGNOSIS — Z8619 Personal history of other infectious and parasitic diseases: Secondary | ICD-10-CM | POA: Insufficient documentation

## 2012-01-02 DIAGNOSIS — M25469 Effusion, unspecified knee: Secondary | ICD-10-CM | POA: Insufficient documentation

## 2012-01-02 DIAGNOSIS — I2510 Atherosclerotic heart disease of native coronary artery without angina pectoris: Secondary | ICD-10-CM | POA: Insufficient documentation

## 2012-01-02 DIAGNOSIS — E785 Hyperlipidemia, unspecified: Secondary | ICD-10-CM | POA: Insufficient documentation

## 2012-01-02 DIAGNOSIS — Z79899 Other long term (current) drug therapy: Secondary | ICD-10-CM | POA: Insufficient documentation

## 2012-01-02 MED ORDER — OXYCODONE HCL 5 MG PO TABS
5.0000 mg | ORAL_TABLET | Freq: Once | ORAL | Status: AC
  Administered 2012-01-02: 5 mg via ORAL
  Filled 2012-01-02: qty 1

## 2012-01-02 MED ORDER — OXYCODONE HCL 5 MG PO TABS: 5.0000 mg | ORAL_TABLET | ORAL | Status: DC | PRN

## 2012-01-02 NOTE — ED Provider Notes (Signed)
History     CSN: [id number]  Arrival date & time 01/02/12  [id number]   First MD Initiated Contact with Patient 01/02/12 [phone]      Chief Complaint  Patient presents with  . Knee Pain    (Consider location/radiation/quality/duration/timing/severity/associated sxs/prior treatment) HPI Comments: Patient with no previous history of immunocompromise presents today with complaints of left knee pain that began approximately one week ago. Patient states that he rolled over in bed and struck his knee on a metal bed railing. Pain was initially mild however over the past 4 days he has had worsening pain and swelling. Pain is made worse with walking. Patient has been given Ultram at [facility] where he resides and this has not helped. He states that he is able walk on the leg however can only go a few steps and has to stop due to pain. Patient denies fever, nausea or vomiting. He reports swelling to the left knee as well as warmth. No recent instrumentation or procedures performed on his knee. No numbness, tingling, or weakness. Onset gradual. Course is gradually worsening. Nothing makes symptoms better.  Patient is a 66 y.o. male presenting with knee pain. The history is provided by the patient.  Knee Pain Associated symptoms include arthralgias. Pertinent negatives include no fever, joint swelling, numbness or weakness.    Past Medical History  Diagnosis Date  . Hyperlipidemia   . Hypertension   . Hepatitis C   . Alcohol abuse     cocaine and tobacco  . Obesity   . Syphilis, secondary     treated  . Sebaceous cyst   . Chronic kidney disease     deemed secondary to HCTZ and lisinopril  . Coronary artery disease     History reviewed. No pertinent past surgical history.  Family History  Problem Relation Age of Onset  . Asthma Mother   . Arthritis Mother   . Coronary artery disease Mother   . Cancer Father     pancreatic cancer  . Coronary artery disease Daughter     questionable     History  Substance Use Topics  . Smoking status: Current Some Day Smoker    Types: Cigarettes    Last Attempt to Quit: 05/21/2007  . Smokeless tobacco: Not on file  . Alcohol Use: No     Comment: h/o heavy alcohol use sober since 4/09      Review of Systems  Constitutional: Negative for fever.  Musculoskeletal: Positive for arthralgias and gait problem. Negative for back pain and joint swelling.  Skin: Negative for wound.  Neurological: Negative for weakness and numbness.    Allergies  Tylenol  Home Medications   Current Outpatient Rx  Name  Route  Sig  Dispense  Refill  . AMLODIPINE BESYLATE 10 MG PO TABS   Oral   Take 1 tablet (10 mg total) by mouth daily.   30 tablet   3   . ASPIRIN 81 MG PO TABS   Oral   Take 1 tablet (81 mg total) by mouth daily. For heart         . ATORVASTATIN CALCIUM 10 MG PO TABS   Oral   Take 1 tablet (10 mg total) by mouth daily. For increased cholesterol   30 tablet   3   . FLUOXETINE HCL 20 MG PO CAPS   Oral   Take 1 capsule (20 mg total) by mouth daily. For depression/anxiety   30 capsule   0   .  PROSIGHT PO TABS   Oral   Take 1 tablet by mouth daily.   30 each      . TRAMADOL HCL 50 MG PO TABS   Oral   Take 1 tablet (50 mg total) by mouth 2 (two) times daily as needed for pain.   30 tablet   0     BP 139/55  Pulse 74  Temp 98.8 F (37.1 C) (Oral)  Resp 18  SpO2 98%  Physical Exam  Nursing note and vitals reviewed. Constitutional: He appears well-developed and well-nourished.  HENT:  Head: Normocephalic and atraumatic.  Eyes: Conjunctivae normal are normal. Right eye exhibits no discharge. Left eye exhibits no discharge.  Neck: Normal range of motion. Neck supple.  Cardiovascular: Normal pulses.   Pulses:      Dorsalis pedis pulses are 2+ on the right side, and 2+ on the left side.       Posterior tibial pulses are 2+ on the right side, and 2+ on the left side.  Pulmonary/Chest: No respiratory  distress.  Musculoskeletal: He exhibits edema and tenderness.       Left hip: Normal.       Left knee: He exhibits effusion and bony tenderness. He exhibits no deformity and no erythema. tenderness (Over patella) found. No medial joint line, no lateral joint line and no patellar tendon tenderness noted.       Left ankle: Normal.       Moderate left knee effusion with warmth. No overlying redness or cellulitis. Normal active ROM with moderate pain.   Neurological: He is alert. No sensory deficit.       Motor, sensation, and vascular distal to the injury is fully intact.   Skin: Skin is warm and dry.  Psychiatric: He has a normal mood and affect.    ED Course  Procedures (including critical care time)  Labs Reviewed - No data to display No results found.   1. Knee effusion, left     7:45 AM Patient seen and examined. Work-up initiated. Medications ordered.   Vital signs reviewed and are as follows: Filed Vitals:   01/02/12 0739  BP: 139/55  Pulse: 74  Temp: 98.8 F (37.1 C)  Resp: 18   9:18 AM Radiologist report reviewed. Small effusion. Pt informed. Knee sleeve by ortho tech.   Patient was counseled on RICE protocol and told to rest injury, use ice for no longer than 15 minutes every hour, compress the area, and elevate above the level of their heart as much as possible to reduce swelling.  Questions answered.  Patient verbalized understanding.    Patient counseled on use of narcotic pain medications. Counseled not to combine these medications with others containing tylenol. Urged not to drink alcohol, drive, or perform any other activities that requires focus while taking these medications. The patient verbalizes understanding and agrees with the plan.  Patient is seen by [facility] orthopedic and he was urged to followup if he continues to have pain in one week.  Patient told to return with worsening severe pain, inability to walk, fever, worsening redness in the skin  overlying around the knee. Patient verbalizes understanding and agrees with the plan.     MDM  Patient with left knee inflammation after striking his knee several days ago. He has an effusion which is likely causing his pain. There is some warmth without signs of cellulitis. No fever or systemic symptoms that would be concerning for septic arthritis. Good range of motion. Patient  has no risk factors for septic arthritis including diabetes, recent instrumentation, immunocompromise. Will treat conservatively with pain medication and orthopedic followup as needed. Strict return instructions given       Carlisle Cater, [place] 01/02/12 [phone]

## 2012-01-02 NOTE — Progress Notes (Signed)
Orthopedic Tech Progress Note Patient Details:  Bruce Hayes 01-11-1946 [id number]  Ortho Devices Type of Ortho Device: Knee Sleeve Ortho Device/Splint Location: Applied knee sleeve to left knee. pt expressed that there is no pain in right knee. Ortho Device/Splint Interventions: Application   Yaw Escoto T 01/02/2012, 9:25 AM

## 2012-01-02 NOTE — ED Provider Notes (Signed)
Medical screening examination/treatment/procedure(s) were performed by non-physician practitioner and as supervising physician I was immediately available for consultation/collaboration.  Varney Biles, MD 01/02/12 [phone]

## 2012-01-02 NOTE — ED Notes (Signed)
Per EMS- Pt comes from [facility] c/o left knee pain. About a week ago bumped knee on bed railing no pain until 4 days ago. Swelling to left knee, and pain. Sensation intact, ROM intact. BP 177/88, HR 90, RR 20.

## 2012-01-02 NOTE — ED Notes (Signed)
Patient taken out in wheelchair, [place] transportation picked patient up.

## 2012-06-11 ENCOUNTER — Emergency Department (HOSPITAL_COMMUNITY)
Admission: EM | Admit: 2012-06-11 | Discharge: 2012-06-11 | Disposition: A | Discharge status: Home / Self Care | Payer: Medicaid Other | Attending: Emergency Medicine | Admitting: Emergency Medicine

## 2012-06-11 ENCOUNTER — Encounter (HOSPITAL_COMMUNITY): Payer: Self-pay | Admitting: Emergency Medicine

## 2012-06-11 DIAGNOSIS — I2510 Atherosclerotic heart disease of native coronary artery without angina pectoris: Secondary | ICD-10-CM | POA: Insufficient documentation

## 2012-06-11 DIAGNOSIS — Z8619 Personal history of other infectious and parasitic diseases: Secondary | ICD-10-CM | POA: Insufficient documentation

## 2012-06-11 DIAGNOSIS — Z8639 Personal history of other endocrine, nutritional and metabolic disease: Secondary | ICD-10-CM | POA: Insufficient documentation

## 2012-06-11 DIAGNOSIS — N189 Chronic kidney disease, unspecified: Secondary | ICD-10-CM | POA: Insufficient documentation

## 2012-06-11 DIAGNOSIS — F17200 Nicotine dependence, unspecified, uncomplicated: Secondary | ICD-10-CM | POA: Insufficient documentation

## 2012-06-11 DIAGNOSIS — Z7982 Long term (current) use of aspirin: Secondary | ICD-10-CM | POA: Insufficient documentation

## 2012-06-11 DIAGNOSIS — Z862 Personal history of diseases of the blood and blood-forming organs and certain disorders involving the immune mechanism: Secondary | ICD-10-CM | POA: Insufficient documentation

## 2012-06-11 DIAGNOSIS — M7989 Other specified soft tissue disorders: Secondary | ICD-10-CM | POA: Insufficient documentation

## 2012-06-11 DIAGNOSIS — M79609 Pain in unspecified limb: Secondary | ICD-10-CM

## 2012-06-11 DIAGNOSIS — E669 Obesity, unspecified: Secondary | ICD-10-CM | POA: Insufficient documentation

## 2012-06-11 DIAGNOSIS — I129 Hypertensive chronic kidney disease with stage 1 through stage 4 chronic kidney disease, or unspecified chronic kidney disease: Secondary | ICD-10-CM | POA: Insufficient documentation

## 2012-06-11 DIAGNOSIS — Z79899 Other long term (current) drug therapy: Secondary | ICD-10-CM | POA: Insufficient documentation

## 2012-06-11 DIAGNOSIS — M25471 Effusion, right ankle: Secondary | ICD-10-CM

## 2012-06-11 DIAGNOSIS — Z872 Personal history of diseases of the skin and subcutaneous tissue: Secondary | ICD-10-CM | POA: Insufficient documentation

## 2012-06-11 NOTE — ED Notes (Signed)
Pt c/o right leg pain with some swelling; pt thinks could be DVT; pt sts x 10 days

## 2012-06-11 NOTE — ED Provider Notes (Signed)
History     CSN: [id number]  Arrival date & time 06/11/12  1417   First MD Initiated Contact with Patient 06/11/12 1502      Chief Complaint  Patient presents with  . Leg Pain    (Consider location/radiation/quality/duration/timing/severity/associated sxs/prior treatment) HPI Pt states he has had R ankle swelling with intermittent pain x 2 weeks. Pt states he walks long distances. No trauma. No recent travel or surgery. No SOB, CP, fever or chills. Pt states swelling is better in the morning and worse throughout the day.  Past Medical History  Diagnosis Date  . Hyperlipidemia   . Hypertension   . Hepatitis C   . Alcohol abuse     cocaine and tobacco  . Obesity   . Syphilis, secondary     treated  . Sebaceous cyst   . Chronic kidney disease     deemed secondary to HCTZ and lisinopril  . Coronary artery disease     History reviewed. No pertinent past surgical history.  Family History  Problem Relation Age of Onset  . Asthma Mother   . Arthritis Mother   . Coronary artery disease Mother   . Cancer Father     pancreatic cancer  . Coronary artery disease Daughter     questionable    History  Substance Use Topics  . Smoking status: Current Some Day Smoker    Types: Cigarettes    Last Attempt to Quit: 05/21/2007  . Smokeless tobacco: Not on file  . Alcohol Use: No     Comment: h/o heavy alcohol use sober since 4/09      Review of Systems  Constitutional: Negative for fever and chills.  HENT: Negative for neck pain.   Respiratory: Negative for shortness of breath.   Cardiovascular: Positive for leg swelling. Negative for chest pain and palpitations.  Gastrointestinal: Negative for nausea, vomiting and abdominal pain.  Musculoskeletal: Negative for myalgias, back pain and arthralgias.  Skin: Positive for color change. Negative for rash and wound.  Neurological: Negative for dizziness, weakness, light-headedness, numbness and headaches.  All other systems  reviewed and are negative.    Allergies  Tylenol  Home Medications   Current Outpatient Rx  Name  Route  Sig  Dispense  Refill  . amLODipine (NORVASC) 10 MG tablet   Oral   Take 1 tablet (10 mg total) by mouth daily.   30 tablet   3   . aspirin 81 MG tablet   Oral   Take 1 tablet (81 mg total) by mouth daily. For heart         . atorvastatin (LIPITOR) 10 MG tablet   Oral   Take 1 tablet (10 mg total) by mouth daily. For increased cholesterol   30 tablet   3   . chlorthalidone (HYGROTON) 25 MG tablet   Oral   Take 25 mg by mouth daily.         Marland Kitchen FLUoxetine (PROZAC) 20 MG capsule   Oral   Take 1 capsule (20 mg total) by mouth daily. For depression/anxiety   30 capsule   0   . hydrALAZINE (APRESOLINE) 25 MG tablet   Oral   Take 25 mg by mouth 2 (two) times daily.         . traMADol (ULTRAM) 50 MG tablet   Oral   Take 1 tablet (50 mg total) by mouth 2 (two) times daily as needed for pain.   30 tablet   0   .  oxyCODONE (ROXICODONE) 5 MG immediate release tablet   Oral   Take 1 tablet (5 mg total) by mouth every 4 (four) hours as needed for pain.   15 tablet   0     BP 154/74  Pulse 82  Temp(Src) 98.8 F (37.1 C) (Oral)  Resp 15  SpO2 99%  Physical Exam  Nursing note and vitals reviewed. Constitutional: He is oriented to person, place, and time. He appears well-developed and well-nourished. No distress.  HENT:  Head: Normocephalic and atraumatic.  Mouth/Throat: Oropharynx is clear and moist.  Eyes: EOM are normal. Pupils are equal, round, and reactive to light.  Neck: Normal range of motion. Neck supple.  Cardiovascular: Normal rate and regular rhythm.   Pulmonary/Chest: Effort normal and breath sounds normal. No respiratory distress. He has no wheezes. He has no rales. He exhibits no tenderness.  Abdominal: Soft. Bowel sounds are normal. He exhibits no distension and no mass. There is no tenderness. There is no rebound and no guarding.   Musculoskeletal: Normal range of motion. He exhibits no edema and no tenderness.  Mild R ankle swelling compared to L. No tenderness. DP 2+. No calf swelling or pain. +discoloration of right ankle consistent with vascular insufficieny.   Neurological: He is alert and oriented to person, place, and time.  5/5 motor in all ext, sensation intact.   Skin: Skin is warm and dry. No rash noted. No erythema.  Psychiatric: He has a normal mood and affect. His behavior is normal.    ED Course  Procedures (including critical care time)  Labs Reviewed - No data to display No results found.   1. Pain in limb   2. Right ankle swelling       MDM   Advised to keep elevated        Julianne Rice, MD 06/11/12 1642

## 2012-06-11 NOTE — ED Notes (Addendum)
Patient said he has been having this "clot" on the side of his right foot and his foot had been getting darker and he put lotion and cocoa butter on it and the darkness went away.  The patient said he has been having this numbness for two weeks and it is not going away.  The patient said he thought it was time to come in and be evaluated.  Patient also says he has a soreness on his chest no radiating anywhere, also the patient denies SOB, N/V, diarrhea or diaphoresis.  He said he noticed it two days ago and think is has something to do with his foot.

## 2012-06-11 NOTE — ED Notes (Signed)
Vascular lab called; stated that pt was negative for DVT; however, pt has small hematoma around right ankle/will advise EDP

## 2012-06-11 NOTE — Progress Notes (Signed)
VASCULAR LAB PRELIMINARY  PRELIMINARY  PRELIMINARY  PRELIMINARY  Right lower extremity venous duplex completed.    Preliminary report:  Right:  No evidence of DVT, superficial thrombosis, or Baker's cyst. There is an area at the lateral ankle with mixed echoes suggestive of a soft tissue hematoma.  Yochanan Eddleman, [place], RVS 06/11/2012, 4:17 PM

## 2012-12-09 ENCOUNTER — Emergency Department (HOSPITAL_COMMUNITY)
Admission: EM | Admit: 2012-12-09 | Discharge: 2012-12-09 | Disposition: A | Discharge status: Home / Self Care | Payer: Medicare Other | Attending: Emergency Medicine | Admitting: Emergency Medicine

## 2012-12-09 ENCOUNTER — Encounter (HOSPITAL_COMMUNITY): Payer: Self-pay | Admitting: Emergency Medicine

## 2012-12-09 ENCOUNTER — Emergency Department (HOSPITAL_COMMUNITY): Payer: Medicare Other

## 2012-12-09 DIAGNOSIS — Z79899 Other long term (current) drug therapy: Secondary | ICD-10-CM | POA: Insufficient documentation

## 2012-12-09 DIAGNOSIS — N189 Chronic kidney disease, unspecified: Secondary | ICD-10-CM | POA: Insufficient documentation

## 2012-12-09 DIAGNOSIS — I2510 Atherosclerotic heart disease of native coronary artery without angina pectoris: Secondary | ICD-10-CM | POA: Insufficient documentation

## 2012-12-09 DIAGNOSIS — Y929 Unspecified place or not applicable: Secondary | ICD-10-CM | POA: Insufficient documentation

## 2012-12-09 DIAGNOSIS — E785 Hyperlipidemia, unspecified: Secondary | ICD-10-CM | POA: Insufficient documentation

## 2012-12-09 DIAGNOSIS — Z888 Allergy status to other drugs, medicaments and biological substances status: Secondary | ICD-10-CM | POA: Insufficient documentation

## 2012-12-09 DIAGNOSIS — Z7982 Long term (current) use of aspirin: Secondary | ICD-10-CM | POA: Insufficient documentation

## 2012-12-09 DIAGNOSIS — I129 Hypertensive chronic kidney disease with stage 1 through stage 4 chronic kidney disease, or unspecified chronic kidney disease: Secondary | ICD-10-CM | POA: Insufficient documentation

## 2012-12-09 DIAGNOSIS — Z8619 Personal history of other infectious and parasitic diseases: Secondary | ICD-10-CM | POA: Insufficient documentation

## 2012-12-09 DIAGNOSIS — E669 Obesity, unspecified: Secondary | ICD-10-CM | POA: Insufficient documentation

## 2012-12-09 DIAGNOSIS — F1010 Alcohol abuse, uncomplicated: Secondary | ICD-10-CM | POA: Insufficient documentation

## 2012-12-09 DIAGNOSIS — S0990XA Unspecified injury of head, initial encounter: Secondary | ICD-10-CM | POA: Insufficient documentation

## 2012-12-09 DIAGNOSIS — W108XXA Fall (on) (from) other stairs and steps, initial encounter: Secondary | ICD-10-CM | POA: Insufficient documentation

## 2012-12-09 DIAGNOSIS — F17200 Nicotine dependence, unspecified, uncomplicated: Secondary | ICD-10-CM | POA: Insufficient documentation

## 2012-12-09 DIAGNOSIS — Y939 Activity, unspecified: Secondary | ICD-10-CM | POA: Insufficient documentation

## 2012-12-09 DIAGNOSIS — Z8719 Personal history of other diseases of the digestive system: Secondary | ICD-10-CM | POA: Insufficient documentation

## 2012-12-09 DIAGNOSIS — R040 Epistaxis: Secondary | ICD-10-CM | POA: Insufficient documentation

## 2012-12-09 NOTE — ED Notes (Signed)
Patient transported to CT 

## 2012-12-09 NOTE — ED Notes (Signed)
Patient transported back from CT 

## 2012-12-09 NOTE — ED Notes (Signed)
Pt sts nose bleeding x 3 today when blowing nose; pt sts takes ASA daily; no bleeding at present; pt sts only bleed for a very short time

## 2012-12-09 NOTE — ED Provider Notes (Signed)
CSN: [id number]     Arrival date & time 12/09/12  1751 History  This chart was scribed for Bruce Mead, PA-C, working with [employer]. Alvino Chapel, MD, by Elby Beck ED Scribe. This patient was seen in room TR10C/TR10C and the patient's care was started at 7:29 PM.    Chief Complaint  Patient presents with  . Epistaxis    The history is provided by the patient. No language interpreter was used.    HPI Comments: Bruce Hayes is a 67 y.o. male who presents to the Emergency Department complaining of intermittent episodes of epistaxis onset today. He states that the episodes have been brief in duration. He states that the blood has been thick, and with some clots. He is currently not having any bleeding. He states that these symptoms began after blowing his nose earlier today. He denies any trauma to his nose. He states that he has had similar nosebleeds in the past, with the last time being about 2 years ago. He takes 81 mg Aspirin daily, but states that he has been taking 325 mg the past few days because that was all he had. He states that he had a fall 3 weeks ago and that he fell backwards down concrete steps and hit the back of his head. He is wondering if this may be related. He states that he was never assessed for this. He denies lateralizing numbness or weakness, tingling, blurred vision or other visual disturbances, eye pain, lightheadedness, dizziness or any other symptoms.  PCP- [facility] in [place], [place]   Past Medical History  Diagnosis Date  . Hyperlipidemia   . Hypertension   . Hepatitis C   . Alcohol abuse     cocaine and tobacco  . Obesity   . Syphilis, secondary     treated  . Sebaceous cyst   . Chronic kidney disease     deemed secondary to HCTZ and lisinopril  . Coronary artery disease    History reviewed. No pertinent past surgical history. Family History  Problem Relation Age of Onset  . Asthma Mother   . Arthritis Mother   . Coronary  artery disease Mother   . Cancer Father     pancreatic cancer  . Coronary artery disease Daughter     questionable   History  Substance Use Topics  . Smoking status: Current Some Day Smoker    Types: Cigarettes    Last Attempt to Quit: 05/21/2007  . Smokeless tobacco: Not on file  . Alcohol Use: No     Comment: h/o heavy alcohol use sober since 4/09    Review of Systems  HENT: Positive for nosebleeds.   Eyes: Negative for pain and visual disturbance.  Neurological: Negative for dizziness, weakness, light-headedness, numbness and headaches.  All other systems reviewed and are negative.   Allergies  Tylenol  Home Medications   Current Outpatient Rx  Name  Route  Sig  Dispense  Refill  . amLODipine (NORVASC) 10 MG tablet   Oral   Take 1 tablet (10 mg total) by mouth daily.   30 tablet   3   . aspirin EC 325 MG tablet   Oral   Take 325 mg by mouth daily.         Marland Kitchen atorvastatin (LIPITOR) 10 MG tablet   Oral   Take 1 tablet (10 mg total) by mouth daily. For increased cholesterol   30 tablet   3   . chlorthalidone (HYGROTON) 25 MG  tablet   Oral   Take 25 mg by mouth daily.         . citalopram (CELEXA) 20 MG tablet   Oral   Take 20 mg by mouth daily.         . Cyanocobalamin (VITAMIN B-12 PO)   Oral   Take 1 tablet by mouth daily.         . hydrALAZINE (APRESOLINE) 25 MG tablet   Oral   Take 25 mg by mouth daily.          Marland Kitchen ibuprofen (ADVIL,MOTRIN) 800 MG tablet   Oral   Take 800 mg by mouth daily as needed (pain).          Marland Kitchen VITAMIN E PO   Oral   Take 1 capsule by mouth daily.          Triage Vitals: BP 173/63  Pulse 86  Temp(Src) 98.4 F (36.9 C) (Oral)  Resp 18  SpO2 99%  Physical Exam  Nursing note and vitals reviewed. Constitutional: He is oriented to person, place, and time. He appears well-developed and well-nourished. No distress.  HENT:  Head: Normocephalic and atraumatic.  Right Ear: External ear normal.  Left Ear:  External ear normal.  Nose: Nose normal.  Mouth/Throat: Oropharynx is clear and moist. No oropharyngeal exudate.  Negative facial trauma identified Negative septal hematoma identified-clear nasal patency noted. Scabbing to the turbinates identified. Negative swelling, erythema, inflammation, sores noted to the turbinates. No active bleeding or drainage noted.  Eyes: Conjunctivae and EOM are normal. Pupils are equal, round, and reactive to light. Right eye exhibits no discharge. Left eye exhibits no discharge.  Neck: Normal range of motion. Neck supple.  Cardiovascular: Normal rate, regular rhythm and normal heart sounds.  Exam reveals no friction rub.   No murmur heard. Pulses:      Radial pulses are 2+ on the right side, and 2+ on the left side.  Pulmonary/Chest: Effort normal and breath sounds normal. No respiratory distress. He has no wheezes. He has no rales.  Musculoskeletal: Normal range of motion.  Neurological: He is alert and oriented to person, place, and time. No cranial nerve deficit. He exhibits normal muscle tone. Coordination normal.  Cranial nerves III through XII grossly intact Gait proper, proper balance  Skin: Skin is warm and dry. No rash noted. He is not diaphoretic. No erythema.  Psychiatric: He has a normal mood and affect. His behavior is normal. Thought content normal.    ED Course  Procedures (including critical care time)  DIAGNOSTIC STUDIES: Oxygen Saturation is 99% on RA, normal by my interpretation.    COORDINATION OF CARE: 7:33 PM- Pt advised of plan for treatment and pt agrees.  Labs Review Labs Reviewed - No data to display Imaging Review Ct Head Wo Contrast  12/09/2012   CLINICAL DATA:  Epistaxis.  EXAM: CT HEAD WITHOUT CONTRAST  CT MAXILLOFACIAL WITHOUT CONTRAST  TECHNIQUE: Multidetector CT imaging of the head and maxillofacial structures were performed using the standard protocol without intravenous contrast. Multiplanar CT image reconstructions  of the maxillofacial structures were also generated.  COMPARISON:  None.  FINDINGS: CT HEAD FINDINGS  The brain has a normal appearance without evidence of atrophy, infarction, mass lesion, hemorrhage, hydrocephalus or extra-axial collection. The calvarium is unremarkable. The paranasal sinuses, middle ears and mastoids are clear. There is a BB within the soft tissues of the right cheek .  CT MAXILLOFACIAL FINDINGS  Paranasal sinuses are clear except for a tiny  fluid level in the left maxillary sinus. Nasal septum is midline. Ostiomeatal complexes appear normal bilaterally. No evidence of nasal mass.  BB in the right cheek soft tissues. There is a small radiopaque foreign object associated with the inferior eye lid on the left.  IMPRESSION: Normal head CT.  No lesion seen to explain epistaxis. A BB within the soft tissues of the right cheek. Small radiopaque foreign object within the lateral lower left lid.   Electronically Signed   By: Nelson Chimes M.D.   On: 12/09/2012 19:49   Ct Maxillofacial Wo Cm  12/09/2012   CLINICAL DATA:  Epistaxis.  EXAM: CT HEAD WITHOUT CONTRAST  CT MAXILLOFACIAL WITHOUT CONTRAST  TECHNIQUE: Multidetector CT imaging of the head and maxillofacial structures were performed using the standard protocol without intravenous contrast. Multiplanar CT image reconstructions of the maxillofacial structures were also generated.  COMPARISON:  None.  FINDINGS: CT HEAD FINDINGS  The brain has a normal appearance without evidence of atrophy, infarction, mass lesion, hemorrhage, hydrocephalus or extra-axial collection. The calvarium is unremarkable. The paranasal sinuses, middle ears and mastoids are clear. There is a BB within the soft tissues of the right cheek .  CT MAXILLOFACIAL FINDINGS  Paranasal sinuses are clear except for a tiny fluid level in the left maxillary sinus. Nasal septum is midline. Ostiomeatal complexes appear normal bilaterally. No evidence of nasal mass.  BB in the right cheek  soft tissues. There is a small radiopaque foreign object associated with the inferior eye lid on the left.  IMPRESSION: Normal head CT.  No lesion seen to explain epistaxis. A BB within the soft tissues of the right cheek. Small radiopaque foreign object within the lateral lower left lid.   Electronically Signed   By: Nelson Chimes M.D.   On: 12/09/2012 19:49    EKG Interpretation   None       MDM   1. Epistaxis    Filed Vitals:   12/09/12 1758 12/09/12 2009  BP: 173/63 151/52  Pulse: 86 83  Temp: 98.4 F (36.9 C) 98 F (36.7 C)  TempSrc: Oral Oral  Resp: 18 18  SpO2: 99% 98%   I personally performed the services described in this documentation, which was scribed in my prsence. The recorded information has been reviewed and is accurate.  Patient presenting to emergency department with epistaxis that started this morning. Patient reports that when he blew his nose this morning the bleeding started. Patient reports he blew his nose again and saw a small amount of blood. Patient reported a fall approximately 3 weeks ago landed on his head on cement steps. When asked regarding if patient is on blood thinners, reported that he currently takes aspirin, baby aspirin. Alert and oriented. Cranial nerves grossly intact. Full range of motion to upper lower extremities bilaterally. Gait proper, proper stance and balance. Negative facial trauma noted. Negative deformities noted to the nose. Nasal patency intact. Negative septal hematoma identified. Clotting noted to turbinates-negative swelling, erythema, inflammation, sores noted to the turbinates. Negative neurological deficits noted. CT scan of maxillofacial and head negative findings for acute abnormalities. When asked about foreign body sensation to the left eye - patient denied. Denied eye pain, swelling, visual changes. Left eye negative swelling, erythema, bulging, tearing, injection. EOMs intake - negative pain with palpation - palpebral  conjunctiva normal. Patient stable, afebrile. Negative epistaxis while in ED setting-bleeding controlled. Patient appears well. Discharged patient. Discussed with patient to avoid any trauma to the nose. Discussed with patient  to avoid blowing the nose. Referred patient to primary care provider. Discussed with patient that the bleeding is to recur to report back to emergency department. Discussed with patient to closely monitor symptoms and if symptoms are to worsen or change report back to emergency department - strict return structures given. Patient agreed to plan of care, understood, all questions answered.   Bruce Mead, PA-C 12/11/12 1342

## 2012-12-12 NOTE — ED Provider Notes (Signed)
Medical screening examination/treatment/procedure(s) were performed by non-physician practitioner and as supervising physician I was immediately available for consultation/collaboration.  EKG Interpretation   None        Jasper Riling. Alvino Chapel, [place] 12/12/12 [phone]

## 2013-01-19 ENCOUNTER — Encounter (HOSPITAL_COMMUNITY): Payer: Self-pay | Admitting: Emergency Medicine

## 2013-01-19 ENCOUNTER — Emergency Department (HOSPITAL_COMMUNITY): Payer: Medicare Other

## 2013-01-19 ENCOUNTER — Emergency Department (HOSPITAL_COMMUNITY)
Admission: EM | Admit: 2013-01-19 | Discharge: 2013-01-20 | Disposition: A | Discharge status: Home / Self Care | Payer: Medicare Other | Attending: Emergency Medicine | Admitting: Emergency Medicine

## 2013-01-19 DIAGNOSIS — F17200 Nicotine dependence, unspecified, uncomplicated: Secondary | ICD-10-CM | POA: Insufficient documentation

## 2013-01-19 DIAGNOSIS — F1010 Alcohol abuse, uncomplicated: Secondary | ICD-10-CM | POA: Insufficient documentation

## 2013-01-19 DIAGNOSIS — Z8619 Personal history of other infectious and parasitic diseases: Secondary | ICD-10-CM | POA: Insufficient documentation

## 2013-01-19 DIAGNOSIS — N189 Chronic kidney disease, unspecified: Secondary | ICD-10-CM | POA: Insufficient documentation

## 2013-01-19 DIAGNOSIS — I129 Hypertensive chronic kidney disease with stage 1 through stage 4 chronic kidney disease, or unspecified chronic kidney disease: Secondary | ICD-10-CM | POA: Insufficient documentation

## 2013-01-19 DIAGNOSIS — Y929 Unspecified place or not applicable: Secondary | ICD-10-CM | POA: Insufficient documentation

## 2013-01-19 DIAGNOSIS — Z888 Allergy status to other drugs, medicaments and biological substances status: Secondary | ICD-10-CM | POA: Insufficient documentation

## 2013-01-19 DIAGNOSIS — S8263XA Displaced fracture of lateral malleolus of unspecified fibula, initial encounter for closed fracture: Secondary | ICD-10-CM | POA: Insufficient documentation

## 2013-01-19 DIAGNOSIS — X500XXA Overexertion from strenuous movement or load, initial encounter: Secondary | ICD-10-CM | POA: Insufficient documentation

## 2013-01-19 DIAGNOSIS — I2510 Atherosclerotic heart disease of native coronary artery without angina pectoris: Secondary | ICD-10-CM | POA: Insufficient documentation

## 2013-01-19 DIAGNOSIS — Y9301 Activity, walking, marching and hiking: Secondary | ICD-10-CM | POA: Insufficient documentation

## 2013-01-19 DIAGNOSIS — E669 Obesity, unspecified: Secondary | ICD-10-CM | POA: Insufficient documentation

## 2013-01-19 DIAGNOSIS — Z8719 Personal history of other diseases of the digestive system: Secondary | ICD-10-CM | POA: Insufficient documentation

## 2013-01-19 DIAGNOSIS — S82832A Other fracture of upper and lower end of left fibula, initial encounter for closed fracture: Secondary | ICD-10-CM

## 2013-01-19 DIAGNOSIS — Z7982 Long term (current) use of aspirin: Secondary | ICD-10-CM | POA: Insufficient documentation

## 2013-01-19 DIAGNOSIS — E785 Hyperlipidemia, unspecified: Secondary | ICD-10-CM | POA: Insufficient documentation

## 2013-01-19 DIAGNOSIS — Z79899 Other long term (current) drug therapy: Secondary | ICD-10-CM | POA: Insufficient documentation

## 2013-01-19 MED ORDER — HYDROCODONE-IBUPROFEN 7.5-200 MG PO TABS: 1.0000 | ORAL_TABLET | Freq: Four times a day (QID) | ORAL | Status: DC | PRN
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
## 2013-01-19 NOTE — ED Provider Notes (Signed)
Medical screening examination/treatment/procedure(s) were performed by non-physician practitioner and as supervising physician I was immediately available for consultation/collaboration.  EKG Interpretation   None         Osvaldo Shipper, MD 01/19/13 2355

## 2013-01-19 NOTE — ED Notes (Addendum)
Pt arrives via [facility]. Pt states he was walking home and left leg gave out on him. Pt states he went down to the ground and is c/o ankle pain. Denies neck or back pain. No loc. Pt stated he drank 6 mixed drinks with vodka.

## 2013-01-19 NOTE — ED Notes (Addendum)
Paged ortho 

## 2013-01-20 NOTE — ED Notes (Signed)
Ortho tech in room 

## 2013-01-20 NOTE — ED Notes (Signed)
Spoke with ortho tech

## 2013-01-20 NOTE — ED Notes (Signed)
Ortho tech repaged

## 2013-01-20 NOTE — Progress Notes (Signed)
Orthopedic Tech Progress Note Patient Details:  Bruce Hayes 22-Mar-1945 [id number]  Ortho Devices Type of Ortho Device: Crutches;Short leg splint   Katheren Shams 01/20/2013, 12:23 AM

## 2013-03-06 ENCOUNTER — Encounter: Payer: Self-pay | Admitting: Gastroenterology

## 2013-05-05 ENCOUNTER — Emergency Department (HOSPITAL_COMMUNITY)
Admission: EM | Admit: 2013-05-05 | Discharge: 2013-05-05 | Disposition: A | Discharge status: Home / Self Care | Payer: Medicare Other | Attending: Emergency Medicine | Admitting: Emergency Medicine

## 2013-05-05 ENCOUNTER — Encounter (HOSPITAL_COMMUNITY): Payer: Self-pay | Admitting: Emergency Medicine

## 2013-05-05 ENCOUNTER — Emergency Department (HOSPITAL_COMMUNITY): Payer: Medicare Other

## 2013-05-05 DIAGNOSIS — E669 Obesity, unspecified: Secondary | ICD-10-CM | POA: Insufficient documentation

## 2013-05-05 DIAGNOSIS — Z8619 Personal history of other infectious and parasitic diseases: Secondary | ICD-10-CM | POA: Insufficient documentation

## 2013-05-05 DIAGNOSIS — I2510 Atherosclerotic heart disease of native coronary artery without angina pectoris: Secondary | ICD-10-CM | POA: Insufficient documentation

## 2013-05-05 DIAGNOSIS — Z7982 Long term (current) use of aspirin: Secondary | ICD-10-CM | POA: Insufficient documentation

## 2013-05-05 DIAGNOSIS — F17200 Nicotine dependence, unspecified, uncomplicated: Secondary | ICD-10-CM | POA: Insufficient documentation

## 2013-05-05 DIAGNOSIS — N189 Chronic kidney disease, unspecified: Secondary | ICD-10-CM | POA: Insufficient documentation

## 2013-05-05 DIAGNOSIS — I129 Hypertensive chronic kidney disease with stage 1 through stage 4 chronic kidney disease, or unspecified chronic kidney disease: Secondary | ICD-10-CM | POA: Insufficient documentation

## 2013-05-05 DIAGNOSIS — Z79899 Other long term (current) drug therapy: Secondary | ICD-10-CM | POA: Insufficient documentation

## 2013-05-05 DIAGNOSIS — Z872 Personal history of diseases of the skin and subcutaneous tissue: Secondary | ICD-10-CM | POA: Insufficient documentation

## 2013-05-05 DIAGNOSIS — E785 Hyperlipidemia, unspecified: Secondary | ICD-10-CM | POA: Insufficient documentation

## 2013-05-05 DIAGNOSIS — R209 Unspecified disturbances of skin sensation: Secondary | ICD-10-CM | POA: Insufficient documentation

## 2013-05-05 DIAGNOSIS — M722 Plantar fascial fibromatosis: Secondary | ICD-10-CM | POA: Insufficient documentation

## 2013-05-05 MED ORDER — IBUPROFEN 400 MG PO TABS
800.0000 mg | ORAL_TABLET | Freq: Once | ORAL | Status: AC
  Administered 2013-05-05: 800 mg via ORAL
  Filled 2013-05-05: qty 2

## 2013-05-05 MED ORDER — MELOXICAM 7.5 MG PO TABS: 7.5000 mg | ORAL_TABLET | Freq: Every day | ORAL | Status: DC

## 2013-05-05 NOTE — ED Provider Notes (Signed)
CSN: [id number]     Arrival date & time 05/05/13  1124 History  This chart was scribed for non-physician practitioner, Clayton Bibles, PA-C working with Hoy Morn, MD by Frederich Balding, ED scribe. This patient was seen in room TR05C/TR05C and the patient's care was started at 1:48 PM.   Chief Complaint  Patient presents with  . Foot Pain   The history is provided by the patient. No language interpreter was used.   HPI Comments: Bruce Hayes is a 68 y.o. male who presents to the Emergency Department complaining of gradual onset, constant right heel pain with associated mild swelling that started 5 days ago. Denies new injury or fall but states he fell and hurt his right lower leg about 8 months ago and it healed well with only residual darkening of the skin. Pt takes long walks and states the pain started after he took one. Bearing weight worsens the pain. Pt has taken ibuprofen with some relief. Denies history of diabetes.  Denies weakness or numbness.   Past Medical History  Diagnosis Date  . Hyperlipidemia   . Hypertension   . Hepatitis C   . Alcohol abuse     cocaine and tobacco  . Obesity   . Syphilis, secondary     treated  . Sebaceous cyst   . Chronic kidney disease     deemed secondary to HCTZ and lisinopril  . Coronary artery disease    History reviewed. No pertinent past surgical history. Family History  Problem Relation Age of Onset  . Asthma Mother   . Arthritis Mother   . Coronary artery disease Mother   . Cancer Father     pancreatic cancer  . Coronary artery disease Daughter     questionable   History  Substance Use Topics  . Smoking status: Current Some Day Smoker    Types: Cigarettes    Last Attempt to Quit: 05/21/2007  . Smokeless tobacco: Not on file  . Alcohol Use: No     Comment: h/o heavy alcohol use sober since 4/09    Review of Systems  Cardiovascular: Positive for leg swelling.  Musculoskeletal: Positive for arthralgias and joint swelling.   Neurological: Positive for numbness.  All other systems reviewed and are negative.   Allergies  Tylenol  Home Medications   Current Outpatient Rx  Name  Route  Sig  Dispense  Refill  . amLODipine (NORVASC) 10 MG tablet   Oral   Take 1 tablet (10 mg total) by mouth daily.   30 tablet   3   . aspirin EC 325 MG tablet   Oral   Take 325 mg by mouth daily.         Marland Kitchen atorvastatin (LIPITOR) 10 MG tablet   Oral   Take 1 tablet (10 mg total) by mouth daily. For increased cholesterol   30 tablet   3   . chlorthalidone (HYGROTON) 25 MG tablet   Oral   Take 25 mg by mouth daily.         . citalopram (CELEXA) 20 MG tablet   Oral   Take 20 mg by mouth daily.         . Cyanocobalamin (VITAMIN B-12 PO)   Oral   Take 1 tablet by mouth daily.         . hydrALAZINE (APRESOLINE) 25 MG tablet   Oral   Take 25 mg by mouth daily.          Marland Kitchen  HYDROcodone-ibuprofen (VICOPROFEN) 7.5-200 MG per tablet   Oral   Take 1 tablet by mouth every 6 (six) hours as needed for moderate pain.   20 tablet   0   . ibuprofen (ADVIL,MOTRIN) 800 MG tablet   Oral   Take 800 mg by mouth daily as needed (pain).          Marland Kitchen VITAMIN E PO   Oral   Take 1 capsule by mouth daily.          BP 153/81  Pulse 85  Temp(Src) 99 F (37.2 C) (Oral)  Resp 18  SpO2 99%  Physical Exam  Nursing note and vitals reviewed. Constitutional: He appears well-developed and well-nourished. No distress.  HENT:  Head: Normocephalic and atraumatic.  Neck: Neck supple.  Pulmonary/Chest: Effort normal.  Musculoskeletal:  Calf non tender. No erythema. No medial and lateral malleoli tenderness. Tenderness around medial heel and tenderness at insertion of plantar fascia. Full ROM of toes. Sensation intact. No other bony tenderness. Pulses intact.  Neurological: He is alert.  Skin: He is not diaphoretic.    ED Course  Procedures (including critical care time)  DIAGNOSTIC STUDIES: Oxygen Saturation is  99% on RA, normal by my interpretation.    COORDINATION OF CARE: 1:49 PM-Discussed treatment plan which includes xray's with pt at bedside and pt agreed to plan.   Labs Review Labs Reviewed - No data to display Imaging Review Dg Ankle Complete Right  05/05/2013   CLINICAL DATA:  Right ankle and heel pain, swelling, numbness and tingling in toes, pain worsened with bearing weight  EXAM: RIGHT ANKLE - COMPLETE 3+ VIEW  COMPARISON:  Right tibial and fibular radiographs 11/09/2011  FINDINGS: Soft tissue swelling lower right leg and ankle.  Mild osseous demineralization.  Ankle mortise intact.  Small plantar calcaneal spur.  No acute fracture, dislocation or bone destruction.  IMPRESSION: No acute osseous abnormalities.  Calcaneal spur.   Electronically Signed   By: Lavonia Dana M.D.   On: 05/05/2013 14:53   Dg Foot Complete Right  05/05/2013   CLINICAL DATA:  Numbness and tingling in toes. Right shin swelling. Pain worse with weight-bearing. Gradual onset. Constant heel pain position with mild swelling that started 5 days ago. Denies recent injury.  EXAM: RIGHT FOOT COMPLETE - 3+ VIEW  COMPARISON:  05/05/2013 ankle films.  FINDINGS: No fracture or dislocation.  Small plantar spur  IMPRESSION: No fracture or dislocation.  Small plantar spur.   Electronically Signed   By: Chauncey Cruel M.D.   On: 05/05/2013 15:09     EKG Interpretation None      MDM   Final diagnoses:  Plantar fasciitis, right   Patient with 5 days of right heel pain after walking across town. Pain is worst first step in the morning and pt is tender at insertion point of plantar fascia.  No calf swelling or tenderness.  Xrays show small plantar spur.  Pt requested walking boot to wear at work.  Pt advised to use stretching, conservative measures at home, NSAIDs, podiatry follow up.  Discussed result, findings, treatment, and follow up  with patient.  Pt given return precautions.  Pt verbalizes understanding and agrees with plan.       I doubt any other EMC precluding discharge at this time including, but not necessarily limited to the following: DVT   I personally performed the services described in this documentation, which was scribed in my presence. The recorded information has been reviewed and is accurate.  [place], PA-C 05/05/13 1552

## 2013-05-05 NOTE — ED Notes (Signed)
Ortho at bedside.

## 2013-05-05 NOTE — Progress Notes (Signed)
Orthopedic Tech Progress Note Patient Details:  Bruce Hayes March 04, 1945 [id number] CAM walker applied to Right LE. Application tolerated well.  Ortho Devices Type of Ortho Device: CAM walker Ortho Device/Splint Interventions: Application   Bruce Hayes 05/05/2013, 4:08 PM

## 2013-05-05 NOTE — ED Notes (Signed)
Back of  Rt heel hurting since last Thursday took OTC meds not helping no new injury he states

## 2013-05-05 NOTE — ED Provider Notes (Signed)
Medical screening examination/treatment/procedure(s) were performed by non-physician practitioner and as supervising physician I was immediately available for consultation/collaboration.   EKG Interpretation None        Hoy Morn, MD 05/05/13 1553

## 2013-05-05 NOTE — Discharge Instructions (Signed)
Read the information below.  Use the prescribed medication as directed.  Please discuss all new medications with your pharmacist.  You may return to the Emergency Department at any time for worsening condition or any new symptoms that concern you.  If you develop uncontrolled pain, weakness or numbness of the extremity, severe discoloration of the skin, or you are unable to walk, return to the ER for a recheck.      Plantar Fasciitis Plantar fasciitis is a common condition that causes foot pain. It is soreness (inflammation) of the band of tough fibrous tissue on the bottom of the foot that runs from the heel bone (calcaneus) to the ball of the foot. The cause of this soreness may be from excessive standing, poor fitting shoes, running on hard surfaces, being overweight, having an abnormal walk, or overuse (this is common in runners) of the painful foot or feet. It is also common in aerobic exercise dancers and ballet dancers. SYMPTOMS  Most people with plantar fasciitis complain of:  Severe pain in the morning on the bottom of their foot especially when taking the first steps out of bed. This pain recedes after a few minutes of walking.  Severe pain is experienced also during walking following a long period of inactivity.  Pain is worse when walking barefoot or up stairs DIAGNOSIS   Your caregiver will diagnose this condition by examining and feeling your foot.  Special tests such as X-rays of your foot, are usually not needed. PREVENTION   Consult a sports medicine professional before beginning a new exercise program.  Walking programs offer a good workout. With walking there is a lower chance of overuse injuries common to runners. There is less impact and less jarring of the joints.  Begin all new exercise programs slowly. If problems or pain develop, decrease the amount of time or distance until you are at a comfortable level.  Wear good shoes and replace them regularly.  Stretch your  foot and the heel cords at the back of the ankle (Achilles tendon) both before and after exercise.  Run or exercise on even surfaces that are not hard. For example, asphalt is better than pavement.  Do not run barefoot on hard surfaces.  If using a treadmill, vary the incline.  Do not continue to workout if you have foot or joint problems. Seek professional help if they do not improve. HOME CARE INSTRUCTIONS   Avoid activities that cause you pain until you recover.  Use ice or cold packs on the problem or painful areas after working out.  Only take over-the-counter or prescription medicines for pain, discomfort, or fever as directed by your caregiver.  Soft shoe inserts or athletic shoes with air or gel sole cushions may be helpful.  If problems continue or become more severe, consult a sports medicine caregiver or your own health care provider. Cortisone is a potent anti-inflammatory medication that may be injected into the painful area. You can discuss this treatment with your caregiver. MAKE SURE YOU:   Understand these instructions.  Will watch your condition.  Will get help right away if you are not doing well or get worse. Document Released: 10/11/2000 Document Revised: 04/10/2011 Document Reviewed: 12/11/2007 [facility] Patient Information 2014 [place], [place].  Heel Spur A heel spur is a hook of bone that can form on the calcaneus (the heel bone and the largest bone of the foot). Heel spurs are often associated with plantar fasciitis and usually come in people who have had  the problem for an extended period of time. The cause of the relationship is unknown. The pain associated with them is thought to be caused by an inflammation (soreness and redness) of the plantar fascia rather than the spur itself. The plantar fascia is a thick fibrous like tissue that runs from the calcaneus (heel bone) to the ball of the foot. This strong, tight tissue helps maintain the arch of your foot.  It helps distribute the weight across your foot as you walk or run. Stresses placed on the plantar fascia can be tremendous. When it is inflamed normal activities become painful. Pain is worse in the morning after sleeping. After sleeping the plantar fascia is tight. The first movements stretch the fascia and this causes pain. As the tendon loosens, the pain usually gets better. It often returns with too much standing or walking.  About 70% of patients with plantar fasciitis have a heel spur. About half of people without foot pain also have heel spurs. DIAGNOSIS  The diagnosis of a heel spur is made by X-ray. The X-ray shows a hook of bone protruding from the bottom of the calcaneus at the point where the plantar fascia is attached to the heel bone.  TREATMENT  It is necessary to find out what is causing the stretching of the plantar fascia. If the cause is over-pronation (flat feet), orthotics and proper foot ware may help.  Stretching exercises, losing weight, wearing shoes that have a cushioned heel that absorbs shock, and elevating the heel with the use of a heel cradle, heel cup, or orthotics may all help. Heel cradles and heel cups provide extra comfort and cushion to the heel, and reduce the amount of shock to the sore area. AVOIDING THE PAIN OF PLANTAR FASCIITIS AND HEEL SPURS  Consult a sports medicine professional before beginning a new exercise program.  Walking programs offer a good workout. There is a lower chance of overuse injuries common to the runners. There is less impact and less jarring of the joints.  Begin all new exercise programs slowly. If problems or pains develop, decrease the amount of time or distance until you are at a comfortable level.  Wear good shoes and replace them regularly.  Stretch your foot and the heel cords at the back of the ankle (Achilles tendons) both before and after exercise.  Run or exercise on even surfaces that are not hard. For example, asphalt  is better than pavement.  Do not run barefoot on hard surfaces.  If using a treadmill, vary the incline.  Do not continue to workout if you have foot or joint problems. Seek professional help if they do not improve. HOME CARE INSTRUCTIONS   Avoid activities that cause you pain until you recover.  Use ice or cold packs to the problem or painful areas after working out.  Only take over-the-counter or prescription medicines for pain, discomfort, or fever as directed by your caregiver.  Soft shoe inserts or athletic shoes with air or gel sole cushions may be helpful.  If problems continue or become more severe, consult a sports medicine caregiver. Cortisone is a potent anti-inflammatory medication that may be injected into the painful area. You can discuss this treatment with your caregiver. MAKE SURE YOU:   Understand these instructions.  Will watch your condition.  Will get help right away if you are not doing well or get worse. Document Released: 02/22/2005 Document Revised: 04/10/2011 Document Reviewed: 04/26/2005 [facility] Patient Information 2014 [place].

## 2013-05-05 NOTE — ED Notes (Signed)
Patient has slight swelling to the right lower extremity, skin intact. Patient has been taking Ibuprofen.

## 2013-05-05 NOTE — ED Notes (Signed)
Ortho Paged for cam walker.

## 2013-10-16 ENCOUNTER — Encounter: Payer: Self-pay | Admitting: Gastroenterology

## 2013-12-05 ENCOUNTER — Encounter (HOSPITAL_COMMUNITY): Payer: Self-pay | Admitting: *Deleted

## 2013-12-05 ENCOUNTER — Emergency Department (HOSPITAL_COMMUNITY)
Admission: EM | Admit: 2013-12-05 | Discharge: 2013-12-06 | Disposition: A | Discharge status: Home / Self Care | Payer: Medicare Other | Source: Home / Self Care | Attending: Emergency Medicine | Admitting: Emergency Medicine

## 2013-12-05 DIAGNOSIS — Z720 Tobacco use: Secondary | ICD-10-CM

## 2013-12-05 DIAGNOSIS — B1920 Unspecified viral hepatitis C without hepatic coma: Secondary | ICD-10-CM | POA: Insufficient documentation

## 2013-12-05 DIAGNOSIS — Z87448 Personal history of other diseases of urinary system: Secondary | ICD-10-CM | POA: Insufficient documentation

## 2013-12-05 DIAGNOSIS — Z79899 Other long term (current) drug therapy: Secondary | ICD-10-CM | POA: Diagnosis not present

## 2013-12-05 DIAGNOSIS — R1011 Right upper quadrant pain: Secondary | ICD-10-CM | POA: Insufficient documentation

## 2013-12-05 DIAGNOSIS — I129 Hypertensive chronic kidney disease with stage 1 through stage 4 chronic kidney disease, or unspecified chronic kidney disease: Secondary | ICD-10-CM

## 2013-12-05 DIAGNOSIS — E785 Hyperlipidemia, unspecified: Secondary | ICD-10-CM | POA: Diagnosis not present

## 2013-12-05 DIAGNOSIS — E669 Obesity, unspecified: Secondary | ICD-10-CM | POA: Insufficient documentation

## 2013-12-05 DIAGNOSIS — N189 Chronic kidney disease, unspecified: Secondary | ICD-10-CM | POA: Insufficient documentation

## 2013-12-05 DIAGNOSIS — K7460 Unspecified cirrhosis of liver: Secondary | ICD-10-CM | POA: Insufficient documentation

## 2013-12-05 DIAGNOSIS — Z9114 Patient's other noncompliance with medication regimen: Secondary | ICD-10-CM

## 2013-12-05 DIAGNOSIS — F1410 Cocaine abuse, uncomplicated: Secondary | ICD-10-CM | POA: Insufficient documentation

## 2013-12-05 DIAGNOSIS — I2510 Atherosclerotic heart disease of native coronary artery without angina pectoris: Secondary | ICD-10-CM

## 2013-12-05 DIAGNOSIS — Z886 Allergy status to analgesic agent status: Secondary | ICD-10-CM | POA: Insufficient documentation

## 2013-12-05 DIAGNOSIS — F1020 Alcohol dependence, uncomplicated: Secondary | ICD-10-CM | POA: Diagnosis not present

## 2013-12-05 DIAGNOSIS — Z7982 Long term (current) use of aspirin: Secondary | ICD-10-CM

## 2013-12-05 DIAGNOSIS — Z8719 Personal history of other diseases of the digestive system: Secondary | ICD-10-CM | POA: Insufficient documentation

## 2013-12-05 DIAGNOSIS — Z590 Homelessness: Secondary | ICD-10-CM | POA: Insufficient documentation

## 2013-12-05 DIAGNOSIS — F329 Major depressive disorder, single episode, unspecified: Secondary | ICD-10-CM

## 2013-12-05 DIAGNOSIS — I159 Secondary hypertension, unspecified: Secondary | ICD-10-CM

## 2013-12-05 DIAGNOSIS — K7030 Alcoholic cirrhosis of liver without ascites: Secondary | ICD-10-CM | POA: Insufficient documentation

## 2013-12-05 DIAGNOSIS — Z791 Long term (current) use of non-steroidal anti-inflammatories (NSAID): Secondary | ICD-10-CM | POA: Insufficient documentation

## 2013-12-05 DIAGNOSIS — F17210 Nicotine dependence, cigarettes, uncomplicated: Secondary | ICD-10-CM

## 2013-12-05 HISTORY — DX: Unspecified cirrhosis of liver: K74.60

## 2013-12-05 LAB — URINALYSIS, ROUTINE W REFLEX MICROSCOPIC
Bilirubin Urine: NEGATIVE
GLUCOSE, UA: NEGATIVE mg/dL
Hgb urine dipstick: NEGATIVE
KETONES UR: NEGATIVE mg/dL
LEUKOCYTES UA: NEGATIVE
Nitrite: NEGATIVE
PROTEIN: 100 mg/dL — AB
Specific Gravity, Urine: 1.02 (ref 1.005–1.030)
Urobilinogen, UA: 1 mg/dL (ref 0.0–1.0)
pH: 5.5 (ref 5.0–8.0)

## 2013-12-05 LAB — CBC WITH DIFFERENTIAL/PLATELET
Basophils Absolute: 0 10*3/uL (ref 0.0–0.1)
Basophils Relative: 0 % (ref 0–1)
EOS PCT: 3 % (ref 0–5)
Eosinophils Absolute: 0.3 10*3/uL (ref 0.0–0.7)
HEMATOCRIT: 37.6 % — AB (ref 39.0–52.0)
Hemoglobin: 13.2 g/dL (ref 13.0–17.0)
LYMPHS ABS: 3.5 10*3/uL (ref 0.7–4.0)
LYMPHS PCT: 47 % — AB (ref 12–46)
MCH: 29.4 pg (ref 26.0–34.0)
MCHC: 35.1 g/dL (ref 30.0–36.0)
MCV: 83.7 fL (ref 78.0–100.0)
MONO ABS: 0.9 10*3/uL (ref 0.1–1.0)
MONOS PCT: 12 % (ref 3–12)
Neutro Abs: 2.8 10*3/uL (ref 1.7–7.7)
Neutrophils Relative %: 38 % — ABNORMAL LOW (ref 43–77)
Platelets: 96 10*3/uL — ABNORMAL LOW (ref 150–400)
RBC: 4.49 MIL/uL (ref 4.22–5.81)
RDW: 14.7 % (ref 11.5–15.5)
WBC: 7.4 10*3/uL (ref 4.0–10.5)

## 2013-12-05 LAB — COMPREHENSIVE METABOLIC PANEL
ALT: 26 U/L (ref 0–53)
AST: 88 U/L — ABNORMAL HIGH (ref 0–37)
Albumin: 2.6 g/dL — ABNORMAL LOW (ref 3.5–5.2)
Alkaline Phosphatase: 137 U/L — ABNORMAL HIGH (ref 39–117)
Anion gap: 15 (ref 5–15)
BUN: 17 mg/dL (ref 6–23)
CALCIUM: 8.6 mg/dL (ref 8.4–10.5)
CO2: 20 meq/L (ref 19–32)
CREATININE: 1.26 mg/dL (ref 0.50–1.35)
Chloride: 108 mEq/L (ref 96–112)
GFR, EST AFRICAN AMERICAN: 67 mL/min — AB (ref >90)
GFR, EST NON AFRICAN AMERICAN: 58 mL/min — AB (ref >90)
GLUCOSE: 136 mg/dL — AB (ref 70–99)
Potassium: 4.1 mEq/L (ref 3.7–5.3)
Sodium: 143 mEq/L (ref 137–147)
Total Bilirubin: 0.6 mg/dL (ref 0.3–1.2)
Total Protein: 7.4 g/dL (ref 6.0–8.3)

## 2013-12-05 LAB — URINE MICROSCOPIC-ADD ON

## 2013-12-05 LAB — LIPASE, BLOOD: LIPASE: 100 U/L — AB (ref 11–59)

## 2013-12-05 MED ORDER — SODIUM CHLORIDE 0.9 % IV BOLUS (SEPSIS)
1000.0000 mL | Freq: Once | INTRAVENOUS | Status: AC
Start: 2013-12-05 — End: 2013-12-06
  Administered 2013-12-05: 1000 mL via INTRAVENOUS

## 2013-12-05 NOTE — ED Provider Notes (Signed)
CSN: [id number]     Arrival date & time 12/05/13  1952 History   First MD Initiated Contact with Patient 12/05/13 2202     Chief Complaint  Patient presents with  . Abdominal Pain     (Consider location/radiation/quality/duration/timing/severity/associated sxs/prior Treatment) Patient is a 68 y.o. male presenting with abdominal pain. The history is provided by the patient.  Abdominal Pain  He complains of right upper quadrant abdominal pain for 3 days.  He has a history of alcoholic cirrhosis.  He has not taken his blood pressure medication in 1 week, because he typically avoids it.  When he is drinking alcohol.  Last alcohol drink was last night, while watching the football game.  He does not currently have a PCP here in [place].  He gets his blood pressure medication, through a study, at [facility].  He denies chest pain, shortness of breath, weakness, dizziness, nausea, vomiting, or bloody stool.  He occasionally has constipation, but tends to have loose stools.  There are no other known modifying factors.  Past Medical History  Diagnosis Date  . Hyperlipidemia   . Hypertension   . Hepatitis C   . Alcohol abuse     cocaine and tobacco  . Obesity   . Syphilis, secondary     treated  . Sebaceous cyst   . Chronic kidney disease     deemed secondary to HCTZ and lisinopril  . Coronary artery disease   . Cirrhosis    History reviewed. No pertinent past surgical history. Family History  Problem Relation Age of Onset  . Asthma Mother   . Arthritis Mother   . Coronary artery disease Mother   . Cancer Father     pancreatic cancer  . Coronary artery disease Daughter     questionable   History  Substance Use Topics  . Smoking status: Current Some Day Smoker    Types: Cigarettes    Last Attempt to Quit: 05/21/2007  . Smokeless tobacco: Not on file  . Alcohol Use: No     Comment: h/o heavy alcohol use sober since 4/09    Review of Systems   Gastrointestinal: Positive for abdominal pain.  All other systems reviewed and are negative.     Allergies  Tylenol  Home Medications   Prior to Admission medications   Medication Sig Start Date End Date Taking? Authorizing Provider  amLODipine (NORVASC) 10 MG tablet Take 1 tablet (10 mg total) by mouth daily. 11/29/11  Yes Bobby Rumpf York, PA-C  aspirin EC 325 MG tablet Take 325 mg by mouth daily.   Yes Historical Provider, MD  atorvastatin (LIPITOR) 10 MG tablet Take 1 tablet (10 mg total) by mouth daily. For increased cholesterol 11/29/11  Yes Marianne L York, PA-C  chlorthalidone (HYGROTON) 25 MG tablet Take 25 mg by mouth daily.   Yes Historical Provider, MD  citalopram (CELEXA) 20 MG tablet Take 20 mg by mouth daily.   Yes Historical Provider, MD  Cyanocobalamin (VITAMIN B-12 PO) Take 1 tablet by mouth daily.   Yes Historical Provider, MD  hydrALAZINE (APRESOLINE) 25 MG tablet Take 25 mg by mouth daily.    Yes Historical Provider, MD  meloxicam (MOBIC) 7.5 MG tablet Take 1 tablet (7.5 mg total) by mouth daily. 05/05/13  Yes Clayton Bibles, PA-C  VITAMIN E PO Take 1 capsule by mouth daily.   Yes Historical Provider, MD   BP 196/87 mmHg  Pulse 83  Temp(Src) 98.8 F (37.1 C)  Resp  16  SpO2 93% Physical Exam  Constitutional: He is oriented to person, place, and time. He appears well-developed and well-nourished.  HENT:  Head: Normocephalic and atraumatic.  Right Ear: External ear normal.  Left Ear: External ear normal.  Eyes: Conjunctivae and EOM are normal. Pupils are equal, round, and reactive to light.  Neck: Normal range of motion and phonation normal. Neck supple.  Cardiovascular: Normal rate, regular rhythm and normal heart sounds.   Pulmonary/Chest: Effort normal and breath sounds normal. He exhibits no bony tenderness.  Abdominal: Soft. There is tenderness (RUQ mild. No Hepatomegaly. ).  Musculoskeletal: Normal range of motion. He exhibits no edema or tenderness.   Neurological: He is alert and oriented to person, place, and time. No cranial nerve deficit or sensory deficit. He exhibits normal muscle tone. Coordination normal.  Skin: Skin is warm, dry and intact.  Psychiatric: He has a normal mood and affect. His behavior is normal. Judgment and thought content normal.  Nursing note and vitals reviewed.   ED Course  Procedures (including critical care time)  Medications - No data to display  Patient Vitals for the past 24 hrs:  BP Temp Pulse Resp SpO2  12/05/13 2234 196/87 mmHg - 83 16 93 %  12/05/13 2215 180/86 mmHg - 84 - 99 %  12/05/13 2202 (!) 202/95 mmHg - - - -  12/05/13 1958 (!) 241/88 mmHg 98.8 F (37.1 C) 100 18 96 %       Labs Review Labs Reviewed  CBC WITH DIFFERENTIAL - Abnormal; Notable for the following:    HCT 37.6 (*)    Platelets 96 (*)    Neutrophils Relative % 38 (*)    Lymphocytes Relative 47 (*)    All other components within normal limits  COMPREHENSIVE METABOLIC PANEL - Abnormal; Notable for the following:    Glucose, Bld 136 (*)    Albumin 2.6 (*)    AST 88 (*)    Alkaline Phosphatase 137 (*)    GFR calc non Af Amer 58 (*)    GFR calc Af Amer 67 (*)    All other components within normal limits  LIPASE, BLOOD - Abnormal; Notable for the following:    Lipase 100 (*)    All other components within normal limits  URINALYSIS, ROUTINE W REFLEX MICROSCOPIC - Abnormal; Notable for the following:    Protein, ur 100 (*)    All other components within normal limits  URINE MICROSCOPIC-ADD ON - Abnormal; Notable for the following:    Casts HYALINE CASTS (*)    All other components within normal limits    Imaging Review No results found.   EKG Interpretation None      MDM   Final diagnoses:  Right upper quadrant pain  Secondary hypertension, unspecified  Noncompliance with medication regimen  Alcoholism    Abdominal pain with history of cirrhosis.  Alcoholism, with medication noncompliance.  He has  medication at home, but chooses not to take it because he is drinking alcohol instead.  Doubt hypertensive urgency, sepsis, metabolic instability or pancreatitis. Lipase is mildly elevated.  However, he is not vomiting.  Nursing Notes Reviewed/ Care Coordinated, and agree without changes. Applicable Imaging Reviewed.  Interpretation of Laboratory Data incorporated into ED treatment  Care to oncoming provider team to evaluate after ultrasound imaging done.    Richarda Blade, MD 12/08/13 [phone]

## 2013-12-05 NOTE — ED Notes (Signed)
Pt. Reports N/V/D and right side abdominal pain x3 days. States he is unable to keep food down, denies problems with fluid.  Hx of cirrhosis, ETOH abuse and hepatitis C. States last drink was yesterday.

## 2013-12-05 NOTE — ED Notes (Signed)
Patient's blood pressure rechecked, and it was still elevated.  He did say he has Hypertension and has not taken his medication because he has been drinking alcohol.

## 2013-12-05 NOTE — ED Notes (Signed)
The pt is c/o abd pain with n v for 3-4 days.  He has a history of cirrhosis of the liver.  He has been drinking alcohol heavily for awhile since 2012.  C/o nv also.  He is also depressed

## 2013-12-06 ENCOUNTER — Observation Stay (EMERGENCY_DEPARTMENT_HOSPITAL)
Admission: EM | Admit: 2013-12-06 | Discharge: 2013-12-07 | Disposition: A | Discharge status: Other Acute Inpatient Hospital | Payer: Medicare Other | Source: Home / Self Care | Attending: Emergency Medicine | Admitting: Emergency Medicine

## 2013-12-06 ENCOUNTER — Encounter (HOSPITAL_COMMUNITY): Payer: Self-pay | Admitting: *Deleted

## 2013-12-06 ENCOUNTER — Emergency Department (HOSPITAL_COMMUNITY): Payer: Medicare Other

## 2013-12-06 DIAGNOSIS — F1910 Other psychoactive substance abuse, uncomplicated: Secondary | ICD-10-CM | POA: Diagnosis present

## 2013-12-06 DIAGNOSIS — I10 Essential (primary) hypertension: Secondary | ICD-10-CM

## 2013-12-06 DIAGNOSIS — F329 Major depressive disorder, single episode, unspecified: Secondary | ICD-10-CM

## 2013-12-06 DIAGNOSIS — F32A Depression, unspecified: Secondary | ICD-10-CM

## 2013-12-06 DIAGNOSIS — F1010 Alcohol abuse, uncomplicated: Secondary | ICD-10-CM | POA: Diagnosis present

## 2013-12-06 DIAGNOSIS — F1020 Alcohol dependence, uncomplicated: Secondary | ICD-10-CM | POA: Diagnosis not present

## 2013-12-06 DIAGNOSIS — F1410 Cocaine abuse, uncomplicated: Secondary | ICD-10-CM

## 2013-12-06 LAB — RAPID URINE DRUG SCREEN, HOSP PERFORMED
Amphetamines: NOT DETECTED
BARBITURATES: NOT DETECTED
Benzodiazepines: NOT DETECTED
COCAINE: POSITIVE — AB
Opiates: NOT DETECTED
TETRAHYDROCANNABINOL: NOT DETECTED

## 2013-12-06 LAB — ETHANOL: Alcohol, Ethyl (B): <11 mg/dL (ref 0–11)

## 2013-12-06 LAB — LIPASE, BLOOD: Lipase: 105 U/L — ABNORMAL HIGH (ref 11–59)

## 2013-12-06 MED ORDER — CITALOPRAM HYDROBROMIDE 20 MG PO TABS
20.0000 mg | ORAL_TABLET | Freq: Every day | ORAL | Status: DC
  Administered 2013-12-07 (×2): 20 mg via ORAL
  Filled 2013-12-06 (×2): qty 1

## 2013-12-06 MED ORDER — AMLODIPINE BESYLATE 10 MG PO TABS
10.0000 mg | ORAL_TABLET | Freq: Every day | ORAL | Status: DC
  Administered 2013-12-07 (×2): 10 mg via ORAL
  Filled 2013-12-06 (×2): qty 1

## 2013-12-06 MED ORDER — VITAMIN B-1 100 MG PO TABS
100.0000 mg | ORAL_TABLET | Freq: Every day | ORAL | Status: DC
  Administered 2013-12-07 (×2): 100 mg via ORAL
  Filled 2013-12-06 (×2): qty 1

## 2013-12-06 MED ORDER — LORAZEPAM 1 MG PO TABS: 0.0000 mg | ORAL_TABLET | Freq: Two times a day (BID) | ORAL | Status: DC

## 2013-12-06 MED ORDER — LORAZEPAM 1 MG PO TABS: 0.0000 mg | ORAL_TABLET | Freq: Four times a day (QID) | ORAL | Status: DC

## 2013-12-06 MED ORDER — HYDRALAZINE HCL 25 MG PO TABS
25.0000 mg | ORAL_TABLET | Freq: Every day | ORAL | Status: DC
  Administered 2013-12-07 (×2): 25 mg via ORAL
  Filled 2013-12-06 (×2): qty 1

## 2013-12-06 MED ORDER — ATORVASTATIN CALCIUM 10 MG PO TABS
10.0000 mg | ORAL_TABLET | Freq: Every day | ORAL | Status: DC
  Administered 2013-12-07 (×2): 10 mg via ORAL
  Filled 2013-12-06 (×2): qty 1

## 2013-12-06 MED ORDER — ASPIRIN EC 325 MG PO TBEC
325.0000 mg | DELAYED_RELEASE_TABLET | Freq: Every day | ORAL | Status: DC
  Administered 2013-12-07 (×2): 325 mg via ORAL
  Filled 2013-12-06 (×2): qty 1

## 2013-12-06 MED ORDER — CHLORTHALIDONE 25 MG PO TABS
25.0000 mg | ORAL_TABLET | Freq: Every day | ORAL | Status: DC
  Administered 2013-12-07 (×2): 25 mg via ORAL
  Filled 2013-12-06 (×2): qty 1

## 2013-12-06 MED ORDER — THIAMINE HCL 100 MG/ML IJ SOLN: 100.0000 mg | Freq: Every day | INTRAMUSCULAR | Status: DC

## 2013-12-06 NOTE — ED Notes (Signed)
Pt to ultrasound via stretcher at this time

## 2013-12-06 NOTE — BH Assessment (Signed)
Assessment completed. Consulted Darlyne Russian, PA-C who recommended inpatient treatment. Dr. Alvino Chapel has been informed of the recommendation.

## 2013-12-06 NOTE — ED Provider Notes (Signed)
CSN: [id number]     Arrival date & time 12/06/13  1721 History   First MD Initiated Contact with Patient 12/06/13 1737     Chief Complaint  Patient presents with  . depression, ETOH detox      (Consider location/radiation/quality/duration/timing/severity/associated sxs/prior Treatment) The history is provided by the patient.  patient is requesting detox off of alcohol. States he drinks 3-4 days a week. He will drink a fifth of liquor. He also occasionally do cocaine. He has depression but denies suicidal thoughts. He states his depression is been getting worse. He was seen yesterday for abdominal pain and had an ultrasound that was overall reassuring. His lipase was mildly elevated at 100. He states his abdominal pain is improved. Patient states he has not been eating and has been vomiting due to all alcohol. He states he does not drink since yesterday and was able to keep down a sandwich.  Past Medical History  Diagnosis Date  . Hyperlipidemia   . Hypertension   . Hepatitis C   . Alcohol abuse     cocaine and tobacco  . Obesity   . Syphilis, secondary     treated  . Sebaceous cyst   . Chronic kidney disease     deemed secondary to HCTZ and lisinopril  . Coronary artery disease   . Cirrhosis    History reviewed. No pertinent past surgical history. Family History  Problem Relation Age of Onset  . Asthma Mother   . Arthritis Mother   . Coronary artery disease Mother   . Cancer Father     pancreatic cancer  . Coronary artery disease Daughter     questionable   History  Substance Use Topics  . Smoking status: Current Some Day Smoker    Types: Cigarettes    Last Attempt to Quit: 05/21/2007  . Smokeless tobacco: Not on file  . Alcohol Use: No     Comment: h/o heavy alcohol use, drinks daily    Review of Systems  Constitutional: Negative for activity change and appetite change.  Eyes: Negative for pain.  Respiratory: Negative for chest tightness and shortness of breath.    Cardiovascular: Negative for chest pain and leg swelling.  Gastrointestinal: Positive for abdominal pain. Negative for nausea, vomiting and diarrhea.  Genitourinary: Negative for flank pain.  Musculoskeletal: Negative for back pain and neck stiffness.  Skin: Negative for rash.  Neurological: Negative for weakness, numbness and headaches.  Psychiatric/Behavioral: Positive for dysphoric mood. Negative for suicidal ideas and behavioral problems.      Allergies  Tylenol  Home Medications   Prior to Admission medications   Medication Sig Start Date End Date Taking? Authorizing Provider  amLODipine (NORVASC) 10 MG tablet Take 1 tablet (10 mg total) by mouth daily. 11/29/11  Yes Bobby Rumpf York, PA-C  aspirin EC 325 MG tablet Take 325 mg by mouth daily.   Yes Historical Provider, MD  atorvastatin (LIPITOR) 10 MG tablet Take 1 tablet (10 mg total) by mouth daily. For increased cholesterol 11/29/11  Yes Marianne L York, PA-C  chlorthalidone (HYGROTON) 25 MG tablet Take 25 mg by mouth daily.   Yes Historical Provider, MD  citalopram (CELEXA) 20 MG tablet Take 20 mg by mouth daily.   Yes Historical Provider, MD  Cyanocobalamin (VITAMIN B-12 PO) Take 1 tablet by mouth daily.   Yes Historical Provider, MD  hydrALAZINE (APRESOLINE) 25 MG tablet Take 25 mg by mouth daily.    Yes Historical Provider, MD  ibuprofen (ADVIL,MOTRIN) 200  MG tablet Take 200 mg by mouth every 6 (six) hours as needed for moderate pain (ankle pain).   Yes Historical Provider, MD  meloxicam (MOBIC) 7.5 MG tablet Take 1 tablet (7.5 mg total) by mouth daily. 05/05/13  Yes Clayton Bibles, PA-C  VITAMIN E PO Take 1 capsule by mouth daily.   Yes Historical Provider, MD   BP 184/88 mmHg  Pulse 94  Temp(Src) 98.8 F (37.1 C) (Oral)  Resp 16  SpO2 99% Physical Exam  Constitutional: He is oriented to person, place, and time. He appears well-developed and well-nourished.  HENT:  Head: Normocephalic and atraumatic.  Eyes: EOM are  normal. Pupils are equal, round, and reactive to light.  Neck: Normal range of motion. Neck supple. No JVD present.  Cardiovascular: Normal rate, regular rhythm and normal heart sounds.   No murmur heard. Pulmonary/Chest: Effort normal and breath sounds normal.  Abdominal: Soft. Bowel sounds are normal. He exhibits no distension and no mass. There is tenderness. There is no rebound and no guarding.  Mild upper abdominal tenderness without rebound or guarding  Musculoskeletal: Normal range of motion. He exhibits no edema.  Neurological: He is alert and oriented to person, place, and time. No cranial nerve deficit.  Skin: Skin is warm and dry.  Psychiatric: He has a normal mood and affect.  Nursing note and vitals reviewed.   ED Course  Procedures (including critical care time) Labs Review Labs Reviewed  URINE RAPID DRUG SCREEN (HOSP PERFORMED) - Abnormal; Notable for the following:    Cocaine POSITIVE (*)    All other components within normal limits  LIPASE, BLOOD - Abnormal; Notable for the following:    Lipase 105 (*)    All other components within normal limits  ETHANOL    Imaging Review US Abdomen Complete  12/06/2013   CLINICAL DATA:  Abdominal pain.  EXAM: ULTRASOUND ABDOMEN COMPLETE  COMPARISON:  MRI abdomen 10/20/2008  FINDINGS: Gallbladder: Gallbladder is contracted with thickened wall. No stones are identified. Gallbladder contraction is indeterminate and is normal if the patient is nonfasting. Murphy's sign is negative.  Common bile duct: Diameter: 3.7 mm, normal  Liver: Diffusely increased hepatic parenchymal echotexture with nodular parenchymal contour suggesting infiltrative process such as cirrhosis. Liver is enlarged, measuring 20.6 cm length. No focal lesions are identified.  IVC: No abnormality visualized.  Pancreas: Not well visualized due to bowel gas.  Spleen: Size and appearance within normal limits.  Right Kidney: Length: 12.8 cm. Echogenicity within normal limits.  No mass or hydronephrosis visualized.  Left Kidney: Length: 12 cm. Echogenicity within normal limits. No mass or hydronephrosis visualized.  Abdominal aorta: No aneurysm visualized.  Other findings: None.  IMPRESSION: Nonspecific contracted gallbladder. Heterogeneous appearance of the liver parenchyma with nodular contour consistent with cirrhosis.   Electronically Signed   By: Lucienne Capers M.D.   On: 12/06/2013 01:58     EKG Interpretation None      MDM   Final diagnoses:  Alcohol abuse  Depression  Essential hypertension    Patient with alcohol abuse. Last drink yesterday. Also some depression. Appears to medically cleared. Lipase was mildly elevated yesterday and stable today. He is feeling better and tolerated orals. To be seen by TTS. Started on CIWA protocol    [employer]. Alvino Chapel, MD 12/06/13 1932

## 2013-12-06 NOTE — ED Notes (Signed)
Pt reports he was seen in ED yesterday given resources for [facility], called today and was told to come to ED. Pt reports he is depressed. Denies SI/HI, AH/VH. Reports he has not taken his depression meds in 1 week. ETOH use daily, up to 10 drinks per day. Pt requesting ETOH detox. Last use last night. Denies pain.

## 2013-12-06 NOTE — ED Provider Notes (Signed)
Care assumed from Dr Eulis Foster.  Pt with alcoholism, cirrhosis with RUQ pain.  Awaiting u/s results.  Pt is expected to be able to go home.  Results for orders placed or performed during the hospital encounter of 12/05/13  CBC with Differential  Result Value Ref Range   WBC 7.4 4.0 - 10.5 K/uL   RBC 4.49 4.22 - 5.81 MIL/uL   Hemoglobin 13.2 13.0 - 17.0 g/dL   HCT 37.6 (L) 39.0 - 52.0 %   MCV 83.7 78.0 - 100.0 fL   MCH 29.4 26.0 - 34.0 pg   MCHC 35.1 30.0 - 36.0 g/dL   RDW 14.7 11.5 - 15.5 %   Platelets 96 (L) 150 - 400 K/uL   Neutrophils Relative % 38 (L) 43 - 77 %   Neutro Abs 2.8 1.7 - 7.7 K/uL   Lymphocytes Relative 47 (H) 12 - 46 %   Lymphs Abs 3.5 0.7 - 4.0 K/uL   Monocytes Relative 12 3 - 12 %   Monocytes Absolute 0.9 0.1 - 1.0 K/uL   Eosinophils Relative 3 0 - 5 %   Eosinophils Absolute 0.3 0.0 - 0.7 K/uL   Basophils Relative 0 0 - 1 %   Basophils Absolute 0.0 0.0 - 0.1 K/uL  Comprehensive metabolic panel  Result Value Ref Range   Sodium 143 137 - 147 mEq/L   Potassium 4.1 3.7 - 5.3 mEq/L   Chloride 108 96 - 112 mEq/L   CO2 20 19 - 32 mEq/L   Glucose, Bld 136 (H) 70 - 99 mg/dL   BUN 17 6 - 23 mg/dL   Creatinine, Ser 1.26 0.50 - 1.35 mg/dL   Calcium 8.6 8.4 - 10.5 mg/dL   Total Protein 7.4 6.0 - 8.3 g/dL   Albumin 2.6 (L) 3.5 - 5.2 g/dL   AST 88 (H) 0 - 37 U/L   ALT 26 0 - 53 U/L   Alkaline Phosphatase 137 (H) 39 - 117 U/L   Total Bilirubin 0.6 0.3 - 1.2 mg/dL   GFR calc non Af Amer 58 (L) >90 mL/min   GFR calc Af Amer 67 (L) >90 mL/min   Anion gap 15 5 - 15  Lipase, blood  Result Value Ref Range   Lipase 100 (H) 11 - 59 U/L  Urinalysis, Routine w reflex microscopic  Result Value Ref Range   Color, Urine YELLOW YELLOW   APPearance CLEAR CLEAR   Specific Gravity, Urine 1.020 1.005 - 1.030   pH 5.5 5.0 - 8.0   Glucose, UA NEGATIVE NEGATIVE mg/dL   Hgb urine dipstick NEGATIVE NEGATIVE   Bilirubin Urine NEGATIVE NEGATIVE   Ketones, ur NEGATIVE NEGATIVE mg/dL   Protein, ur 100 (A) NEGATIVE mg/dL   Urobilinogen, UA 1.0 0.0 - 1.0 mg/dL   Nitrite NEGATIVE NEGATIVE   Leukocytes, UA NEGATIVE NEGATIVE  Urine microscopic-add on  Result Value Ref Range   Squamous Epithelial / LPF RARE RARE   WBC, UA 0-2 <3 WBC/hpf   Bacteria, UA RARE RARE   Casts HYALINE CASTS (A) NEGATIVE   US Abdomen Complete  12/06/2013   CLINICAL DATA:  Abdominal pain.  EXAM: ULTRASOUND ABDOMEN COMPLETE  COMPARISON:  MRI abdomen 10/20/2008  FINDINGS: Gallbladder: Gallbladder is contracted with thickened wall. No stones are identified. Gallbladder contraction is indeterminate and is normal if the patient is nonfasting. Murphy's sign is negative.  Common bile duct: Diameter: 3.7 mm, normal  Liver: Diffusely increased hepatic parenchymal echotexture with nodular parenchymal contour suggesting infiltrative process such as  cirrhosis. Liver is enlarged, measuring 20.6 cm length. No focal lesions are identified.  IVC: No abnormality visualized.  Pancreas: Not well visualized due to bowel gas.  Spleen: Size and appearance within normal limits.  Right Kidney: Length: 12.8 cm. Echogenicity within normal limits. No mass or hydronephrosis visualized.  Left Kidney: Length: 12 cm. Echogenicity within normal limits. No mass or hydronephrosis visualized.  Abdominal aorta: No aneurysm visualized.  Other findings: None.  IMPRESSION: Nonspecific contracted gallbladder. Heterogeneous appearance of the liver parenchyma with nodular contour consistent with cirrhosis.   Electronically Signed   By: Lucienne Capers M.D.   On: 12/06/2013 01:58      Kalman Drape, MD 12/06/13 [phone]

## 2013-12-06 NOTE — ED Notes (Signed)
Patient has on burgundy scrubs and yellow socks.  Patient has been wanded by security.  Patient has two personal belonging bags.

## 2013-12-06 NOTE — Discharge Instructions (Signed)
It is very important for you to take you blood pressure medication.  A list of providers is below, please use it to establish a local provider.   Emergency Department Resource Guide 1) Find a Doctor and Pay Out of Pocket Although you won't have to find out who is covered by your insurance plan, it is a good idea to ask around and get recommendations. You will then need to call the office and see if the doctor you have chosen will accept you as a new patient and what types of options they offer for patients who are self-pay. Some doctors offer discounts or will set up payment plans for their patients who do not have insurance, but you will need to ask so you aren't surprised when you get to your appointment.  2) Contact Your Local Health Department Not all health departments have doctors that can see patients for sick visits, but many do, so it is worth a call to see if yours does. If you don't know where your local health department is, you can check in your phone book. The CDC also has a tool to help you locate your state's health department, and many state websites also have listings of all of their local health departments.  3) Find a [facility] If your illness is not likely to be very severe or complicated, you may want to try a walk in clinic. These are popping up all over the country in pharmacies, drugstores, and shopping centers. They're usually staffed by nurse practitioners or physician assistants that have been trained to treat common illnesses and complaints. They're usually fairly quick and inexpensive. However, if you have serious medical issues or chronic medical problems, these are probably not your best option.  No Primary Care Doctor: - Call Health Connect at  [phone] - they can help you locate a primary care doctor that  accepts your insurance, provides certain services, etc. - Physician Referral Service- [phone]  Chronic Pain Problems: Organization          Address  Phone   Notes  [facility]  [phone] Patients need to be referred by their primary care doctor.   Medication Assistance: Organization         Address  Phone   Notes  [facility] Medication [facility] [address] [place], [place] [address] [phone] --Must be a resident of [facility] -- Must have NO insurance coverage whatsoever (no Medicaid/ Medicare, etc.) -- The pt. MUST have a primary care doctor that directs their care regularly and follows them in the community   [facility]ist  [phone]   [employer]  [phone]    Agencies that provide inexpensive medical care: Organization         Address  Phone   Notes  [place]  [phone]   [facility]    [phone]   [facility] [place],  [phone]   [place] [address] [place] [phone]   Planned Parenthood    [phone]   [facility]    [phone]   [place] and [address], [place] Phone:  [phone], Fax:  [phone] Hours of Operation:  9 am - 6 pm, M-F.  Also accepts Medicaid/Medicare and self-pay.  [facility] for [place], [address], [place] Phone: [phone], Fax: [phone]. Hours of Operation:  8:30 am - 5:30 pm, M-F.  Also accepts Medicaid and self-pay.  [facility] [place] [address] [place] Phone: [phone]   [place], [place], [place] [phone], Ext. [phone] Mondays & Thursdays: 7-9 AM.  First 15 patients are seen on a first come, first serve basis.    [place] Providers:  Organization         Address  Phone   Notes  [facility] [address], [place] [phone] Also accepts self-pay patients.  [facility] [id number], [place]  [phone]   [place], [address], [place] [phone]   [facility] [address] [place] [phone]   Lucianne Lei [address], [place]   [phone] Only accepts [place] Access [place] patients after they have their name applied to their card.   Self-Pay (no insurance) in [facility]:  Organization         Address  Phone   Notes  Sickle Cell Patients, [facility] Internal Medicine [place] [phone]   [facility] Urgent Care [place] [phone]   [facility] [place]  [place], [place], [place] [phone]   [facility]/Dr. Osei-Bonsu  [address] [place] or [address], [place] [phone] Phone number for both [place] and [place] locations is the same.  [facility] and [facility] [address], [place] [phone]   [facility] [address] [place] or [address] [phone]   [facility] [address] [place] [phone], phone; [phone], fax Sees patients 1st and 3rd Saturday of every month.  Must not qualify for public or private insurance (i.e. Medicaid, Medicare, [place] Health Choice, Veterans' Benefits)  Household income should be no more than 200% of the poverty level The clinic cannot treat you if you are pregnant or think you are pregnant  Sexually transmitted diseases are not treated at the clinic.    Dental Care: Organization         Address  Phone  Notes  [facility] Department of [facility] [place] [phone] Accepts children up to age 76 who are enrolled in [place] or [place]; pregnant women with a Medicaid card; and  children who have applied for Medicaid or [place] Health Choice, but were declined, whose parents can pay a reduced fee at time of service.  [facility] Department of [facility]  [address], [place] [phone] Accepts children up to age 54 who are enrolled in [place] or [place]; pregnant women with a Medicaid card; and children who have applied for Medicaid or [place] Health Choice, but were declined, whose parents can pay a reduced fee at time of service.  [place] [facility] Access PROGRAM  [place] [phone] Patients are seen by appointment only. Walk-ins are not accepted. [place] will see patients 87 years of age and older. Monday - Tuesday (8am-5pm) Most Wednesdays (8:30-5pm) $30 per visit, cash only  [place]  [address], [facility] [phone] Patients are seen by appointment only. Walk-ins are not accepted. [place] will see patients 71 years of age and older. One Wednesday Evening (Monthly: Volunteer Based).  $30 per visit, cash only  [place]  [phone] for adults; Children under age 87, call Graduate Pediatric Dentistry at [phone]. Children aged 3-14, please call [phone] to request a pediatric application.  Dental services are provided in all areas of dental care including fillings, crowns and bridges, complete and partial dentures, implants, gum treatment, root canals, and extractions. Preventive care is also provided. Treatment is provided to both adults and children. Patients are selected via a lottery and there is often a waiting list.   [facility] [address], [place]  [phone] [email or web address]   [facility] [address] [place], [place] [phone], Ext. [phone] Second and Fourth Thursday of each month, opens at 6:30 AM; Clinic ends at 9 AM.  Patients are seen on a first-come first-served  basis, and a limited number are seen during each clinic.   [facility]  [address] Hillard Danker [place], [place] [phone]   Eligibility Requirements You must have lived in [place], [place], or [place] counties for at least the last three months.   You cannot be eligible for state or federal sponsored [employer], including [employer], [place], or [employer].   You generally cannot be eligible for healthcare insurance through your employer.    How to apply: Eligibility screenings are held every Tuesday and Wednesday afternoon from 1:00 pm until 4:00 pm. You do not need an appointment for the interview!  [facility] [address] [place], [place]   [place]  [place] Department  [place]  [phone]    Behavioral Health Resources in the Community: Intensive Outpatient Programs Organization         Address  Phone  Notes  [place]. [address] [place], [place] [phone]   [facility] Outpatient [address] [place], [place]   ADS: Alcohol [facility] Drug Svcs [address], [place], [place]   [place] [address],  [place], [place] or [phone]   Substance Abuse Resources Organization         Address  Phone  Notes  Alcohol and Drug Services  [phone]   [place]  [phone]   [facility] [place]   Chinita Pester  [phone]   Residential & Outpatient Substance Abuse Program  [phone]   Psychological Services Organization         Address  Phone  Notes  [facility] [place]  [place]  [phone]   [place] [address] [place] or [phone]    Mobile Crisis Teams Organization          Address  Phone  Notes  [facility], Mobile Crisis Care Unit  [phone]   [facility]  [address] [place], [place]   [place] [address] [place] [phone]    Self-Help/Support Groups Organization         Address  Phone             Notes  [place]. of [place] -  variety of support groups  336- [phone] Call for more information  Narcotics Anonymous (NA), [facility] [address], [employer] [place]  2 meetings at this location   Residential Facilities manager         Address  Phone  Notes  [facility] [place],    [place]  [phone]   [facility]  [address] [place] [id number], [place], [place]   [place], [place] [phone] Admissions: 8am-3pm M-F  Incentives Substance [place] [address]    [place], [place] [phone]   [facility] [address] [place], [place], [place]   [facility] [address],  [place], [place]   [facility] - Intensive Outpatient [address], [place], [place]   [facility] ([place] [address].,  [place], [place] [phone] or [phone]   [facility] (RTS) [address], [place], [place] Accepts Medicaid  [facility] [place] [address],  [place] [phone] Substance Abuse/Addiction Treatment   [facility] Organization         Address  Phone  Notes  [facility]  [phone]   Domenic Schwab, PhD [address] Arlis Porta [place], [place]   [phone] or [phone]   [place], [place] [phone]   [facility] [address] [place], [place] [phone] Insurance/Medicaid/sponsorship  through [facility] and Families [address]                                    [place], [place] [phone] [place] [address], [place] [phone]    Dr. Adele Schilder  [phone]   [facility] Dept. 1) [address] [place] 2) [place] 3)  [place] [address], [place] [phone]  [phone]   [place] [phone] or [phone] (After Hours)       Alcohol Use Disorder Alcohol use disorder is a mental disorder. It is not a one-time incident of heavy drinking. Alcohol use disorder is the excessive and uncontrollable use of alcohol over time that leads to problems with functioning in one or more areas of daily living. People with this disorder risk harming themselves and others when they drink to excess. Alcohol use disorder also can cause other mental disorders, such as mood and anxiety disorders, and serious physical problems. People with alcohol use disorder often misuse other drugs.  Alcohol use disorder is common and widespread. Some people with this disorder drink alcohol to cope with or escape from negative life events. Others drink to relieve chronic pain or symptoms of mental illness. People with a family history of alcohol use disorder are at higher risk of losing control and using alcohol to excess.  SYMPTOMS  Signs and symptoms of alcohol use disorder may include the following:   Consumption ofalcohol inlarger amounts or over a longer period of time than intended.  Multiple unsuccessful attempts to cutdown or control alcohol use.   A great deal of time spent obtaining alcohol, using alcohol, or recovering from the effects of  alcohol (hangover).  A strong desire or urge to use alcohol (cravings).   Continued use of alcohol despite problems at work, school, or home because of  alcohol use.   Continued use of alcohol despite problems in relationships because of alcohol use.  Continued use of alcohol in situations when it is physically hazardous, such as driving a car.  Continued use of alcohol despite awareness of a physical or psychological problem that is likely related to alcohol use. Physical problems related to alcohol use can involve the brain, heart, liver, stomach, and intestines. Psychological problems related to alcohol use include intoxication, depression, anxiety, psychosis, delirium, and dementia.   The need for increased amounts of alcohol to achieve the same desired effect, or a decreased effect from the consumption of the same amount of alcohol (tolerance).  Withdrawal symptoms upon reducing or stopping alcohol use, or alcohol use to reduce or avoid withdrawal symptoms. Withdrawal symptoms include:  Racing heart.  Hand tremor.  Difficulty sleeping.  Nausea.  Vomiting.  Hallucinations.  Restlessness.  Seizures. DIAGNOSIS Alcohol use disorder is diagnosed through an assessment by your health care provider. Your health care provider may start by asking three or four questions to screen for excessive or problematic alcohol use. To confirm a diagnosis of alcohol use disorder, at least two symptoms must be present within a 39-month period. The severity of alcohol use disorder depends on the number of symptoms:  Mild--two or three.  Moderate--four or five.  Severe--six or more. Your health care provider may perform a physical exam or use results from lab tests to see if you have physical problems resulting from alcohol use. Your health care provider may refer you to a mental health professional for evaluation. TREATMENT  Some people with alcohol use disorder are able to reduce their alcohol use to low-risk levels. Some people with alcohol use disorder need to quit drinking alcohol. When necessary, mental health professionals with specialized  training in substance use treatment can help. Your health care provider can help you decide how severe your alcohol use disorder is and what type of treatment you need. The following forms of treatment are available:   Detoxification. Detoxification involves the use of prescription medicines to prevent alcohol withdrawal symptoms in the first week after quitting. This is important for people with a history of symptoms of withdrawal and for heavy drinkers who are likely to have withdrawal symptoms. Alcohol withdrawal can be dangerous and, in severe cases, cause death. Detoxification is usually provided in a hospital or in-patient substance use treatment facility.  Counseling or talk therapy. Talk therapy is provided by substance use treatment counselors. It addresses the reasons people use alcohol and ways to keep them from drinking again. The goals of talk therapy are to help people with alcohol use disorder find healthy activities and ways to cope with life stress, to identify and avoid triggers for alcohol use, and to handle cravings, which can cause relapse.  Medicines.Different medicines can help treat alcohol use disorder through the following actions:  Decrease alcohol cravings.  Decrease the positive reward response felt from alcohol use.  Produce an uncomfortable physical reaction when alcohol is used (aversion therapy).  Support groups. Support groups are run by people who have quit drinking. They provide emotional support, advice, and guidance. These forms of treatment are often combined. Some people with alcohol use disorder benefit from intensive combination treatment provided by specialized substance use treatment centers. Both inpatient and outpatient treatment programs are available. Document Released: 02/24/2004  Document Revised: 06/02/2013 Document Reviewed: 04/25/2012 [facility] Patient Information 2015 [place], [place]. This information is not intended to replace advice given to you  by your health care provider. Make sure you discuss any questions you have with your health care provider.  Hypertension Hypertension, commonly called high blood pressure, is when the force of blood pumping through your arteries is too strong. Your arteries are the blood vessels that carry blood from your heart throughout your body. A blood pressure reading consists of a higher number over a lower number, such as 110/72. The higher number (systolic) is the pressure inside your arteries when your heart pumps. The lower number (diastolic) is the pressure inside your arteries when your heart relaxes. Ideally you want your blood pressure below 120/80. Hypertension forces your heart to work harder to pump blood. Your arteries may become narrow or stiff. Having hypertension puts you at risk for heart disease, stroke, and other problems.  RISK FACTORS Some risk factors for high blood pressure are controllable. Others are not.  Risk factors you cannot control include:   Race. You may be at higher risk if you are African American.  Age. Risk increases with age.  Gender. Men are at higher risk than women before age 76 years. After age 35, women are at higher risk than men. Risk factors you can control include:  Not getting enough exercise or physical activity.  Being overweight.  Getting too much fat, sugar, calories, or salt in your diet.  Drinking too much alcohol. SIGNS AND SYMPTOMS Hypertension does not usually cause signs or symptoms. Extremely high blood pressure (hypertensive crisis) may cause headache, anxiety, shortness of breath, and nosebleed. DIAGNOSIS  To check if you have hypertension, your health care provider will measure your blood pressure while you are seated, with your arm held at the level of your heart. It should be measured at least twice using the same arm. Certain conditions can cause a difference in blood pressure between your right and left arms. A blood pressure reading  that is higher than normal on one occasion does not mean that you need treatment. If one blood pressure reading is high, ask your health care provider about having it checked again. TREATMENT  Treating high blood pressure includes making lifestyle changes and possibly taking medicine. Living a healthy lifestyle can help lower high blood pressure. You may need to change some of your habits. Lifestyle changes may include:  Following the DASH diet. This diet is high in fruits, vegetables, and whole grains. It is low in salt, red meat, and added sugars.  Getting at least 2 hours of brisk physical activity every week.  Losing weight if necessary.  Not smoking.  Limiting alcoholic beverages.  Learning ways to reduce stress. If lifestyle changes are not enough to get your blood pressure under control, your health care provider may prescribe medicine. You may need to take more than one. Work closely with your health care provider to understand the risks and benefits. HOME CARE INSTRUCTIONS  Have your blood pressure rechecked as directed by your health care provider.   Take medicines only as directed by your health care provider. Follow the directions carefully. Blood pressure medicines must be taken as prescribed. The medicine does not work as well when you skip doses. Skipping doses also puts you at risk for problems.   Do not smoke.   Monitor your blood pressure at home as directed by your health care provider. SEEK MEDICAL CARE IF:   You think  you are having a reaction to medicines taken.  You have recurrent headaches or feel dizzy.  You have swelling in your ankles.  You have trouble with your vision. SEEK IMMEDIATE MEDICAL CARE IF:  You develop a severe headache or confusion.  You have unusual weakness, numbness, or feel faint.  You have severe chest or abdominal pain.  You vomit repeatedly.  You have trouble breathing. MAKE SURE YOU:   Understand these  instructions.  Will watch your condition.  Will get help right away if you are not doing well or get worse. Document Released: 01/16/2005 Document Revised: 06/02/2013 Document Reviewed: 11/08/2012 [facility] Patient Information 2015 [place], [place]. This information is not intended to replace advice given to you by your health care provider. Make sure you discuss any questions you have with your health care provider.

## 2013-12-06 NOTE — BH Assessment (Signed)
Tele Assessment Note   Bruce Hayes is an 68 y.o. male presenting to [facility] ED requesting alcohol detox. Pt reported that he went to [facility] last night and was informed that he has some scarring on his liver and if he stops drinking it could heal itself. Pt stated "I want to stop drinking and not die".  Pt denied SI, HI and AVH at this time. Pt did not report any previous suicide attempts or psychiatric hospitalizations. Pt reported that he completed a detox program in the past and was able to maintain his sobriety for 3 years. Pt reported that he drinks with friends several times a week and they also snort cocaine weekly. Pt is endorsing multiple depressive symptoms and shared that his sleep and appetite are poor. Pt reported that he wakes frequently throughout the night and gets approximately 4hours of sleep overall. Pt reported that his appetite is poor and he has not been able to eat for the past 3 days. Pt also shared that he is dealing with multiple stressors due to his alcohol use; he has loss his housing and he has some financial concerns. Pt denied having access to weapons and did not report any pending criminal charges or upcoming court dates. Pt also shared that he has been non-compliant with his medication because "I did not want to mix them with alcohol".  Pt is alert and oriented x3. Pt is calm and cooperative at this time.  Pt maintained fair eye contact throughout this assessment. Pt speech is normal and his thought process is logical and coherent. Pt mood is depressed and his affect is congruent with his mood. Pt reported that he is currently homeless due to his substance abuse and he can count on his sister for support.    Axis I: Alcohol Use Disorder, Moderate   Past Medical History:  Past Medical History  Diagnosis Date  . Hyperlipidemia   . Hypertension   . Hepatitis C   . Alcohol abuse     cocaine and tobacco  . Obesity   . Syphilis, secondary     treated  . Sebaceous cyst    . Chronic kidney disease     deemed secondary to HCTZ and lisinopril  . Coronary artery disease   . Cirrhosis     History reviewed. No pertinent past surgical history.  Family History:  Family History  Problem Relation Age of Onset  . Asthma Mother   . Arthritis Mother   . Coronary artery disease Mother   . Cancer Father     pancreatic cancer  . Coronary artery disease Daughter     questionable    Social History:  reports that he has been smoking Cigarettes.  He has been smoking about 0.00 packs per day. He does not have any smokeless tobacco history on file. He reports that he uses illicit drugs (Cocaine). He reports that he does not drink alcohol.  Additional Social History:  Alcohol / Drug Use History of alcohol / drug use?: Yes Longest period of sobriety (when/how long): 3 years  Negative Consequences of Use: Personal relationships, Financial Substance #1 Name of Substance 1: Alcohol  1 - Age of First Use: 27 1 - Amount (size/oz): "1/2 of a 1/5" or "1/2 gallon with friends"  1 - Frequency: "3-4 days a week' 1 - Duration: 5 years  1 - Last Use / Amount: 12-04-13 Substance #2 Name of Substance 2: Cocaine  2 - Age of First Use: 40's  2 -  Amount (size/oz): "3-4 lines" 2 - Frequency: weekly  2 - Duration: 5 years  2 - Last Use / Amount: 12-04-13  CIWA: CIWA-Ar BP: 193/88 mmHg Pulse Rate: 86 COWS:    PATIENT STRENGTHS: (choose at least two) Average or above average intelligence Motivation for treatment/growth  Allergies:  Allergies  Allergen Reactions  . Tylenol [Acetaminophen] Other (See Comments)    Liver condition    Home Medications:  (Not in a hospital admission)  OB/GYN Status:  No LMP for male patient.  General Assessment Data Location of Assessment: [facility] ED Is this a Tele or Face-to-Face Assessment?: Face-to-Face Is this an Initial Assessment or a Re-assessment for this encounter?: Initial Assessment Living Arrangements: Other (Comment)  (Homeless) Can pt return to current living arrangement?: Yes Admission Status: Voluntary Is patient capable of signing voluntary admission?: Yes Transfer from: Home Referral Source: Self/Family/Friend     [place] Living Arrangements: Other (Comment) (Homeless) Name of Psychiatrist: No provider reported  Name of Therapist: No provider reported  Education Status Is patient currently in school?: No Current Grade: NA Highest grade of school patient has completed: NA Name of school: NA Contact person: NA  Risk to self with the past 6 months Suicidal Ideation: No Suicidal Intent: No Is patient at risk for suicide?: No Suicidal Plan?: No Access to Means: No What has been your use of drugs/alcohol within the last 12 months?: Pt reported alcohol use several times a week and weekly cocaine use.  Previous Attempts/Gestures: No How many times?: 0 Other Self Harm Risks: No other self harm risk identified at this time.  Triggers for Past Attempts: None known Intentional Self Injurious Behavior: None Family Suicide History: No Recent stressful life event(s): Other (Comment) (Housing loss ) Persecutory voices/beliefs?: No Depression: Yes Depression Symptoms: Despondent, Insomnia, Isolating, Guilt, Loss of interest in usual pleasures, Feeling worthless/self pity, Feeling angry/irritable Substance abuse history and/or treatment for substance abuse?: Yes Suicide prevention information given to non-admitted patients: Not applicable  Risk to Others within the past 6 months Homicidal Ideation: No Thoughts of Harm to Others: No Current Homicidal Intent: No Current Homicidal Plan: No Access to Homicidal Means: No Identified Victim: NA History of harm to others?: No Assessment of Violence: None Noted Violent Behavior Description: No violent behaviors observed. Pt is calm and cooperative at this time.  Does patient have access to weapons?: No Criminal Charges Pending?: No Does  patient have a court date: No  Psychosis Hallucinations: None noted Delusions: None noted  Mental Status Report Appear/Hygiene: In scrubs Eye Contact: Fair Motor Activity: Freedom of movement Speech: Logical/coherent Level of Consciousness: Alert Mood: Depressed Affect: Appropriate to circumstance Anxiety Level: Minimal Judgement: Unimpaired Orientation: Person, Place, Time, Situation Obsessive Compulsive Thoughts/Behaviors: None  Cognitive Functioning Concentration: Normal Memory: Recent Intact, Remote Intact IQ: Average Insight: Good Impulse Control: Fair Appetite: Poor Weight Loss: 0 Weight Gain: 0 Sleep: Decreased Total Hours of Sleep: 4 (Frequent awakening ) Vegetative Symptoms: Staying in bed  ADLScreening [facility] Assessment Services) Patient's cognitive ability adequate to safely complete daily activities?: Yes Patient able to express need for assistance with ADLs?: Yes Independently performs ADLs?: Yes (appropriate for developmental age)  Prior Inpatient Therapy Prior Inpatient Therapy: Yes Prior Therapy Dates: 2012 Prior Therapy Facilty/Provider(s): [facility]; [facility]  Reason for Treatment: Substance abuse  Prior Outpatient Therapy Prior Outpatient Therapy: Yes Prior Therapy Dates: 2012  Prior Therapy Facilty/Provider(s): [facility]  Reason for Treatment: Depression/Med Mgmt   ADL Screening (condition at time of admission) Patient's  cognitive ability adequate to safely complete daily activities?: Yes Is the patient deaf or have difficulty hearing?: No Does the patient have difficulty seeing, even when wearing glasses/contacts?: No Does the patient have difficulty concentrating, remembering, or making decisions?: No Patient able to express need for assistance with ADLs?: Yes Does the patient have difficulty dressing or bathing?: No Independently performs ADLs?: Yes (appropriate for developmental age)       Abuse/Neglect Assessment (Assessment to be  complete while patient is alone) Physical Abuse: Denies Verbal Abuse: Yes, past (Comment) Sexual Abuse: Denies Exploitation of patient/patient's resources: Denies Self-Neglect: Denies     Regulatory affairs officer (For Healthcare) Does patient have an advance directive?: No Would patient like information on creating an advanced directive?: No - patient declined information    Additional Information 1:1 In Past 12 Months?: No     Disposition:     Evanthia Maund S 12/06/2013 9:30 PM

## 2013-12-07 ENCOUNTER — Encounter (HOSPITAL_COMMUNITY): Payer: Self-pay | Admitting: Registered Nurse

## 2013-12-07 ENCOUNTER — Observation Stay (HOSPITAL_COMMUNITY)
Admission: AD | Admit: 2013-12-07 | Discharge: 2013-12-08 | Disposition: A | Discharge status: Home / Self Care | Payer: Medicare Other | Source: Intra-hospital | Attending: Psychiatry | Admitting: Psychiatry

## 2013-12-07 DIAGNOSIS — Z720 Tobacco use: Secondary | ICD-10-CM

## 2013-12-07 DIAGNOSIS — F1010 Alcohol abuse, uncomplicated: Secondary | ICD-10-CM

## 2013-12-07 DIAGNOSIS — F1410 Cocaine abuse, uncomplicated: Secondary | ICD-10-CM

## 2013-12-07 DIAGNOSIS — F1994 Other psychoactive substance use, unspecified with psychoactive substance-induced mood disorder: Secondary | ICD-10-CM

## 2013-12-07 DIAGNOSIS — F329 Major depressive disorder, single episode, unspecified: Secondary | ICD-10-CM | POA: Insufficient documentation

## 2013-12-07 DIAGNOSIS — F1020 Alcohol dependence, uncomplicated: Secondary | ICD-10-CM | POA: Diagnosis present

## 2013-12-07 DIAGNOSIS — F32A Depression, unspecified: Secondary | ICD-10-CM | POA: Insufficient documentation

## 2013-12-07 DIAGNOSIS — F1910 Other psychoactive substance abuse, uncomplicated: Secondary | ICD-10-CM | POA: Diagnosis present

## 2013-12-07 MED ORDER — LORAZEPAM 1 MG PO TABS
1.0000 mg | ORAL_TABLET | Freq: Four times a day (QID) | ORAL | Status: DC
  Administered 2013-12-07 – 2013-12-08 (×2): 1 mg via ORAL
  Filled 2013-12-07 (×2): qty 1

## 2013-12-07 MED ORDER — LORAZEPAM 1 MG PO TABS: 1.0000 mg | ORAL_TABLET | Freq: Four times a day (QID) | ORAL | Status: DC | PRN

## 2013-12-07 MED ORDER — CHLORTHALIDONE 25 MG PO TABS
25.0000 mg | ORAL_TABLET | Freq: Every day | ORAL | Status: DC
  Administered 2013-12-08: 25 mg via ORAL
  Filled 2013-12-07 (×2): qty 1

## 2013-12-07 MED ORDER — THIAMINE HCL 100 MG/ML IJ SOLN: 100.0000 mg | Freq: Every day | INTRAMUSCULAR | Status: DC

## 2013-12-07 MED ORDER — MELOXICAM 7.5 MG PO TABS
7.5000 mg | ORAL_TABLET | Freq: Every day | ORAL | Status: DC
  Administered 2013-12-08: 7.5 mg via ORAL
  Filled 2013-12-07 (×3): qty 1

## 2013-12-07 MED ORDER — ASPIRIN EC 325 MG PO TBEC
325.0000 mg | DELAYED_RELEASE_TABLET | Freq: Every day | ORAL | Status: DC
  Administered 2013-12-08: 325 mg via ORAL
  Filled 2013-12-07 (×2): qty 1

## 2013-12-07 MED ORDER — CHLORTHALIDONE 25 MG PO TABS: 25.0000 mg | ORAL_TABLET | Freq: Every day | ORAL | Status: DC

## 2013-12-07 MED ORDER — CITALOPRAM HYDROBROMIDE 20 MG PO TABS: 20.0000 mg | ORAL_TABLET | Freq: Every day | ORAL | Status: DC

## 2013-12-07 MED ORDER — HYDROXYZINE HCL 25 MG PO TABS: 25.0000 mg | ORAL_TABLET | Freq: Four times a day (QID) | ORAL | Status: DC | PRN

## 2013-12-07 MED ORDER — VITAMIN B-1 100 MG PO TABS
100.0000 mg | ORAL_TABLET | Freq: Every day | ORAL | Status: DC
  Administered 2013-12-08: 100 mg via ORAL
  Filled 2013-12-07 (×3): qty 1

## 2013-12-07 MED ORDER — ADULT MULTIVITAMIN W/MINERALS CH
1.0000 | ORAL_TABLET | Freq: Every day | ORAL | Status: DC
  Administered 2013-12-08: 1 via ORAL
  Filled 2013-12-07 (×3): qty 1

## 2013-12-07 MED ORDER — LORAZEPAM 1 MG PO TABS: 1.0000 mg | ORAL_TABLET | Freq: Two times a day (BID) | ORAL | Status: DC

## 2013-12-07 MED ORDER — LORAZEPAM 1 MG PO TABS: 1.0000 mg | ORAL_TABLET | Freq: Three times a day (TID) | ORAL | Status: DC

## 2013-12-07 MED ORDER — HYDRALAZINE HCL 25 MG PO TABS: 25.0000 mg | ORAL_TABLET | Freq: Every day | ORAL | Status: DC

## 2013-12-07 MED ORDER — ALUM & MAG HYDROXIDE-SIMETH 200-200-20 MG/5ML PO SUSP: 30.0000 mL | ORAL | Status: DC | PRN

## 2013-12-07 MED ORDER — LOPERAMIDE HCL 2 MG PO CAPS: 2.0000 mg | ORAL_CAPSULE | ORAL | Status: DC | PRN

## 2013-12-07 MED ORDER — CLONIDINE HCL 0.1 MG PO TABS
0.1000 mg | ORAL_TABLET | ORAL | Status: AC
  Administered 2013-12-07: 0.1 mg via ORAL
  Filled 2013-12-07 (×2): qty 1

## 2013-12-07 MED ORDER — CHLORDIAZEPOXIDE HCL 25 MG PO CAPS: 25.0000 mg | ORAL_CAPSULE | Freq: Four times a day (QID) | ORAL | Status: DC | PRN

## 2013-12-07 MED ORDER — VITAMIN B-1 100 MG PO TABS: 100.0000 mg | ORAL_TABLET | Freq: Every day | ORAL | Status: DC

## 2013-12-07 MED ORDER — AMLODIPINE BESYLATE 10 MG PO TABS: 10.0000 mg | ORAL_TABLET | Freq: Every day | ORAL | Status: DC

## 2013-12-07 MED ORDER — ATORVASTATIN CALCIUM 10 MG PO TABS
10.0000 mg | ORAL_TABLET | Freq: Every morning | ORAL | Status: DC
  Administered 2013-12-08: 10 mg via ORAL
  Filled 2013-12-07 (×3): qty 1

## 2013-12-07 MED ORDER — HYDRALAZINE HCL 25 MG PO TABS
25.0000 mg | ORAL_TABLET | Freq: Every day | ORAL | Status: DC
  Administered 2013-12-08: 25 mg via ORAL
  Filled 2013-12-07 (×2): qty 1

## 2013-12-07 MED ORDER — ASPIRIN EC 325 MG PO TBEC: 325.0000 mg | DELAYED_RELEASE_TABLET | Freq: Every day | ORAL | Status: DC

## 2013-12-07 MED ORDER — ATORVASTATIN CALCIUM 10 MG PO TABS: 10.0000 mg | ORAL_TABLET | Freq: Every day | ORAL | Status: DC

## 2013-12-07 MED ORDER — AMLODIPINE BESYLATE 10 MG PO TABS
10.0000 mg | ORAL_TABLET | Freq: Every day | ORAL | Status: DC
  Administered 2013-12-08: 10 mg via ORAL
  Filled 2013-12-07: qty 2
  Filled 2013-12-07 (×2): qty 1

## 2013-12-07 MED ORDER — ONDANSETRON 4 MG PO TBDP: 4.0000 mg | ORAL_TABLET | Freq: Four times a day (QID) | ORAL | Status: DC | PRN

## 2013-12-07 MED ORDER — CITALOPRAM HYDROBROMIDE 20 MG PO TABS
20.0000 mg | ORAL_TABLET | Freq: Every day | ORAL | Status: DC
  Administered 2013-12-08: 20 mg via ORAL
  Filled 2013-12-07 (×3): qty 1

## 2013-12-07 MED ORDER — MAGNESIUM HYDROXIDE 400 MG/5ML PO SUSP
30.0000 mL | Freq: Every day | ORAL | Status: DC | PRN
  Filled 2013-12-07: qty 30

## 2013-12-07 MED ORDER — LORAZEPAM 1 MG PO TABS: 1.0000 mg | ORAL_TABLET | Freq: Every day | ORAL | Status: DC

## 2013-12-07 NOTE — ED Notes (Signed)
Dr Leonides Schanz aware of BP's OK to transfer, Pt asymptomatic and reports that he has not been taking his BP medication x2 weeks and started back on it yesterday.

## 2013-12-07 NOTE — BH Assessment (Signed)
[facility] declined Pt due to substance abuse.  Orpah Greek Rosana Hoes, [facility] Triage Specialist [phone]

## 2013-12-07 NOTE — ED Notes (Addendum)
Pt ambulatory w/o difficulty to [facility] w/ [facility].  Belongings given to driver

## 2013-12-07 NOTE — ED Notes (Signed)
[facility] contacted for transport 

## 2013-12-07 NOTE — BH Assessment (Signed)
Bruce Hayes, [facility], adult unit is at capacity. Contacted the following facilities for placement:  BED AVAILABLE, PT IS UNDER REVIEW: [facility], per [facility], per Opal Sidles   AT CAPACITY: [facility], per [facility], per Wandra Feinstein, per [facility], per [employer], per [facility], per [facility], per [facility], per [facility], per [facility], per [facility], per [facility], per G[facility] Islam, per [facility], per [facility], per Alvira Philips RESPONSE: [facility]   [address] [place], [place], [facility] Triage Specialist [phone]

## 2013-12-07 NOTE — ED Notes (Signed)
Alex in w/ pt

## 2013-12-07 NOTE — ED Notes (Signed)
OBS will call back for report

## 2013-12-07 NOTE — Consult Note (Signed)
[facility] Consult   Reason for Consult:  Alcohol Abuse Referring Physician:  EDP  Bruce Hayes is an 68 y.o. male. Total Time spent with patient: 45 minutes  Assessment: AXIS I:  Alcohol Abuse, Substance Abuse and Substance Induced Mood Disorder AXIS II:  Deferred AXIS III:   Past Medical History  Diagnosis Date  . Hyperlipidemia   . Hypertension   . Hepatitis C   . Alcohol abuse     cocaine and tobacco  . Obesity   . Syphilis, secondary     treated  . Sebaceous cyst   . Chronic kidney disease     deemed secondary to HCTZ and lisinopril  . Coronary artery disease   . Cirrhosis    AXIS IV:  housing problems, other psychosocial or environmental problems, problems related to social environment and problems with primary support group AXIS V:  61-70 mild symptoms  Plan:  Supportive therapy provided about ongoing stressors. Discussed crisis plan, support from social network, calling 911, coming to the Emergency Department, and calling Suicide Hotline. 24 hour observation  Subjective:   Bruce Hayes is a 67 y.o. male patient presents [facility] ED requesting alcohol detox.  HPI:  Patient states that he drinks almost fifth of liquor daily.  States that his last drink was Thursday (12/04/13). Patient states that he has had detox once and was sober for 3 years afterward.  Patient is seeking long term rehab or outpatient services "I want to get clean."  Patient states that he does have some depression with recent loss of his house because of drinking and is now homeless.  Patient states that he is not speaking with his family at this time until he gets himself cleaned up "They have enough on them right now and don't need no mo problems.  I want to do this on my on and when I'm clean; I talk with them."   Patient states that he has had suicidal thoughts in the past.  Denies suicidal/homicidal ideation, psychosis, and paranoia.  Patient also denies seizures and any  psychiatric history other than alcohol detox.    HPI Elements:   Location:  alcohol abuse. Quality:  daily consumption of alcohol. Severity:  homelessness. Timing:  1 day. Review of Systems  HENT: Negative.   Respiratory: Negative for shortness of breath and wheezing.   Musculoskeletal: Negative.   Neurological: Negative for tremors, seizures and loss of consciousness.  Psychiatric/Behavioral: Positive for depression and substance abuse. Negative for suicidal ideas, hallucinations and memory loss. The patient is not nervous/anxious and does not have insomnia.   All other systems reviewed and are negative.  Family History  Problem Relation Age of Onset  . Asthma Mother   . Arthritis Mother   . Coronary artery disease Mother   . Cancer Father     pancreatic cancer  . Coronary artery disease Daughter     questionable     Past Psychiatric History: Past Medical History  Diagnosis Date  . Hyperlipidemia   . Hypertension   . Hepatitis C   . Alcohol abuse     cocaine and tobacco  . Obesity   . Syphilis, secondary     treated  . Sebaceous cyst   . Chronic kidney disease     deemed secondary to HCTZ and lisinopril  . Coronary artery disease   . Cirrhosis     reports that he has been smoking Cigarettes.  He has been smoking about 0.00 packs per day.  He does not have any smokeless tobacco history on file. He reports that he uses illicit drugs (Cocaine). He reports that he does not drink alcohol. Family History  Problem Relation Age of Onset  . Asthma Mother   . Arthritis Mother   . Coronary artery disease Mother   . Cancer Father     pancreatic cancer  . Coronary artery disease Daughter     questionable   Family History Substance Abuse: Yes, Describe: (Paternal uncle, cousin ) Family Supports: Yes, List: (Sister) Living Arrangements: Other (Comment) (Homeless) Can pt return to current living arrangement?: Yes Abuse/Neglect [facility]) Physical Abuse: Denies Verbal Abuse:  Yes, past (Comment) Sexual Abuse: Denies Allergies:   Allergies  Allergen Reactions  . Tylenol [Acetaminophen] Other (See Comments)    Liver condition    ACT Assessment Complete:  Yes:    Educational Status    Risk to Self: Risk to self with the past 6 months Suicidal Ideation: No Suicidal Intent: No Is patient at risk for suicide?: No Suicidal Plan?: No Access to Means: No What has been your use of drugs/alcohol within the last 12 months?: Pt reported alcohol use several times a week and weekly cocaine use.  Previous Attempts/Gestures: No How many times?: 0 Other Self Harm Risks: No other self harm risk identified at this time.  Triggers for Past Attempts: None known Intentional Self Injurious Behavior: None Family Suicide History: No Recent stressful life event(s): Other (Comment) (Housing loss ) Persecutory voices/beliefs?: No Depression: Yes Depression Symptoms: Despondent, Insomnia, Isolating, Guilt, Loss of interest in usual pleasures, Feeling worthless/self pity, Feeling angry/irritable Substance abuse history and/or treatment for substance abuse?: Yes Suicide prevention information given to non-admitted patients: Not applicable  Risk to Others: Risk to Others within the past 6 months Homicidal Ideation: No Thoughts of Harm to Others: No Current Homicidal Intent: No Current Homicidal Plan: No Access to Homicidal Means: No Identified Victim: NA History of harm to others?: No Assessment of Violence: None Noted Violent Behavior Description: No violent behaviors observed. Pt is calm and cooperative at this time.  Does patient have access to weapons?: No Criminal Charges Pending?: No Does patient have a court date: No  Abuse: Abuse/Neglect Assessment (Assessment to be complete while patient is alone) Physical Abuse: Denies Verbal Abuse: Yes, past (Comment) Sexual Abuse: Denies Exploitation of patient/patient's resources: Denies Self-Neglect: Denies  Prior Inpatient  Therapy: Prior Inpatient Therapy Prior Inpatient Therapy: Yes Prior Therapy Dates: 2012 Prior Therapy Facilty/Provider(s): [facility]; [facility]  Reason for Treatment: Substance abuse  Prior Outpatient Therapy: Prior Outpatient Therapy Prior Outpatient Therapy: Yes Prior Therapy Dates: 2012  Prior Therapy Facilty/Provider(s): [facility]  Reason for Treatment: Depression/Med Mgmt   Additional Information: Additional Information 1:1 In Past 12 Months?: No                  Objective: Blood pressure 183/76, pulse 83, temperature 98.2 F (36.8 C), temperature source Oral, resp. rate 20, SpO2 98 %.There is no weight on file to calculate BMI. Results for orders placed or performed during the hospital encounter of 12/06/13 (from the past 72 hour(s))  Drug screen panel, emergency     Status: Abnormal   Collection Time: 12/06/13  6:04 PM  Result Value Ref Range   Opiates NONE DETECTED NONE DETECTED   Cocaine POSITIVE (A) NONE DETECTED   Benzodiazepines NONE DETECTED NONE DETECTED   Amphetamines NONE DETECTED NONE DETECTED   Tetrahydrocannabinol NONE DETECTED NONE DETECTED   Barbiturates NONE DETECTED NONE DETECTED  Comment:        DRUG SCREEN FOR MEDICAL PURPOSES ONLY.  IF CONFIRMATION IS NEEDED FOR ANY PURPOSE, NOTIFY LAB WITHIN 5 DAYS.        LOWEST DETECTABLE LIMITS FOR URINE DRUG SCREEN Drug Class       Cutoff (ng/mL) Amphetamine      1000 Barbiturate      200 Benzodiazepine   [id number] Tricyclics       [id number] Opiates          300 Cocaine          300 THC              50   Ethanol     Status: None   Collection Time: 12/06/13  6:26 PM  Result Value Ref Range   Alcohol, Ethyl (B) <11 0 - 11 mg/dL    Comment:        LOWEST DETECTABLE LIMIT FOR SERUM ALCOHOL IS 11 mg/dL FOR MEDICAL PURPOSES ONLY   Lipase, blood     Status: Abnormal   Collection Time: 12/06/13  6:26 PM  Result Value Ref Range   Lipase 105 (H) 11 - 59 U/L   Labs are reviewed see abnormal values  above.  Medications reviewed; no changes made.  Started Librium 25 mg Q 6 hr PRN CIWA.    Current Facility-Administered Medications  Medication Dose Route Frequency Provider Last Rate Last Dose  . amLODipine (NORVASC) tablet 10 mg  10 mg Oral Daily Jasper Riling. Pickering, MD   10 mg at 12/07/13 0958  . aspirin EC tablet 325 mg  325 mg Oral Daily Jasper Riling. Pickering, MD   325 mg at 12/07/13 0958  . atorvastatin (LIPITOR) tablet 10 mg  10 mg Oral Daily Jasper Riling. Pickering, MD   10 mg at 12/07/13 0958  . chlorthalidone (HYGROTON) tablet 25 mg  25 mg Oral Daily Nathan R. Pickering, MD   25 mg at 12/07/13 0958  . citalopram (CELEXA) tablet 20 mg  20 mg Oral Daily Jasper Riling. Pickering, MD   20 mg at 12/07/13 0958  . hydrALAZINE (APRESOLINE) tablet 25 mg  25 mg Oral Daily Nathan R. Pickering, MD   25 mg at 12/07/13 0958  . LORazepam (ATIVAN) tablet 0-4 mg  0-4 mg Oral 4 times per day Jasper Riling. Alvino Chapel, MD       Followed by  . [START ON 12/08/2013] LORazepam (ATIVAN) tablet 0-4 mg  0-4 mg Oral Q12H Nathan R. Pickering, MD      . thiamine (VITAMIN B-1) tablet 100 mg  100 mg Oral Daily Jasper Riling. Pickering, MD   100 mg at 12/07/13 [id number]   Or  . thiamine (B-1) injection 100 mg  100 mg Intravenous Daily Jasper Riling. Alvino Chapel, MD       Current Outpatient Prescriptions  Medication Sig Dispense Refill  . amLODipine (NORVASC) 10 MG tablet Take 1 tablet (10 mg total) by mouth daily. 30 tablet 3  . aspirin EC 325 MG tablet Take 325 mg by mouth daily.    Marland Kitchen atorvastatin (LIPITOR) 10 MG tablet Take 1 tablet (10 mg total) by mouth daily. For increased cholesterol 30 tablet 3  . chlorthalidone (HYGROTON) 25 MG tablet Take 25 mg by mouth daily.    . citalopram (CELEXA) 20 MG tablet Take 20 mg by mouth daily.    . Cyanocobalamin (VITAMIN B-12 PO) Take 1 tablet by mouth daily.    . hydrALAZINE (APRESOLINE) 25 MG tablet Take 25 mg by  mouth daily.     Marland Kitchen ibuprofen (ADVIL,MOTRIN) 200 MG tablet Take 200 mg by mouth every 6  (six) hours as needed for moderate pain (ankle pain).    . meloxicam (MOBIC) 7.5 MG tablet Take 1 tablet (7.5 mg total) by mouth daily. 14 tablet 0  . VITAMIN E PO Take 1 capsule by mouth daily.      Psychiatric Specialty Exam:     Blood pressure 183/76, pulse 83, temperature 98.2 F (36.8 C), temperature source Oral, resp. rate 20, SpO2 98 %.There is no weight on file to calculate BMI.  General Appearance: Casual and Fairly Groomed  Eye Contact::  Good  Speech:  Clear and Coherent and Normal Rate  Volume:  Normal  Mood:  Depressed  Affect:  Congruent  Thought Process:  Circumstantial and Goal Directed  Orientation:  Full (Time, Place, and Person)  Thought Content:  Rumination  Suicidal Thoughts:  No  Homicidal Thoughts:  No  Memory:  Immediate;   Good Recent;   Good Remote;   Good  Judgement:  Fair  Insight:  Fair  Psychomotor Activity:  Normal  Concentration:  Fair  Recall:  Good  Fund of Knowledge:Fair  Language: Good  Akathisia:  No  Handed:  Right  AIMS (if indicated):     Assets:  Communication Skills Desire for Improvement Resilience  Sleep:      Musculoskeletal: Strength & Muscle Tone: within normal limits Gait & Station: normal Patient leans: N/A  Treatment Plan Summary: 24 hour observation  Daltyn Degroat, FNP-BC 12/07/2013 1:26 PM

## 2013-12-07 NOTE — Progress Notes (Signed)
Patient ID: Bruce Hayes, male   DOB: Jul 09, 1945, 68 y.o.   MRN: [id number] Called back for a report and rn was busy per tech at 1530 on 12-07-13,,sbw

## 2013-12-07 NOTE — Progress Notes (Signed)
Pt blood pressure elevated (see VS flowsheet). Dr. Sabra Heck notified and orders received. Pt denies pain or discomfort. Will continue to monitor closely.

## 2013-12-07 NOTE — ED Notes (Signed)
Pt. To [facility] from ED ambulatory without difficulty, to room 43 . Report from [employer]. Pt. Is alert and oriented, warm and dry in no distress. Pt. Denies SI, HI, and AVH. Pt. Calm and cooperative. Pt. Encouraged to let Nursing staff know of any concerns or needs.

## 2013-12-07 NOTE — Progress Notes (Signed)
Pt alert, oriented and cooperative. Denies SI/HI, -A/[place]. Affect/mood sad and depressed. "Everytime I think I would stop drinking I start back"; "The more depressed I get the more I drink". Pt denies pain or discomfort. Will continue to monitor closely and evaluate for stabilization.

## 2013-12-07 NOTE — ED Notes (Signed)
Up to the bathroom 

## 2013-12-07 NOTE — Progress Notes (Signed)
Patient ID: Bruce Hayes, male   DOB: 26-Aug-1945, 68 y.o.   MRN: [id number] Received report from [place] at [facility] in [place] at about 1615. She stated she would send the pt by [facility].

## 2013-12-07 NOTE — Progress Notes (Signed)
Patient ID: Bruce Hayes, male   DOB: [birth year], 68 y.o.   MRN: [id number] Nursing admission note: pt came to the obs area from [place] area on a voluntary basis. He is wanting detox from etoh and cocaine use/abuse. He stated that he usually drinks on a daily basis and uses cocaine sometimes. His last use of both of these substances was on Thursday of this wk. (12-04-2013). He denied any si/hi/av and pain on admission. He wears eye glasses, has a hx of a right broken ankle, hep C, CAD and cirrhosis. He stated he has been off his home medication B/T 1 and 2 weeks. This pt ate on admission and had no request, then went to sleep. This pt stated [facility] He remains in the obs area and staff continue to monitor him. His admission is complete.

## 2013-12-07 NOTE — ED Provider Notes (Signed)
2:01 PM  Pt accepted to [facility] per Dr. Sabra Heck.   [place], DO 12/07/13 1401

## 2013-12-07 NOTE — Progress Notes (Signed)
BP rechecked and Dr. Sabra Heck notified of results.. New orders given. See Mar. Pt denies pain or discomfort. Will continue to monitor closely and evaluate for stabilization.

## 2013-12-08 DIAGNOSIS — F1020 Alcohol dependence, uncomplicated: Secondary | ICD-10-CM | POA: Diagnosis not present

## 2013-12-08 DIAGNOSIS — F1994 Other psychoactive substance use, unspecified with psychoactive substance-induced mood disorder: Secondary | ICD-10-CM

## 2013-12-08 DIAGNOSIS — F1019 Alcohol abuse with unspecified alcohol-induced disorder: Secondary | ICD-10-CM

## 2013-12-08 MED ORDER — ASPIRIN EC 325 MG PO TBEC: 325.0000 mg | DELAYED_RELEASE_TABLET | Freq: Every day | ORAL | Status: DC

## 2013-12-08 MED ORDER — CITALOPRAM HYDROBROMIDE 20 MG PO TABS: 20.0000 mg | ORAL_TABLET | Freq: Every day | ORAL | Status: DC

## 2013-12-08 MED ORDER — CHLORTHALIDONE 25 MG PO TABS: 25.0000 mg | ORAL_TABLET | Freq: Every day | ORAL | Status: DC

## 2013-12-08 MED ORDER — AMLODIPINE BESYLATE 10 MG PO TABS: 10.0000 mg | ORAL_TABLET | Freq: Every day | ORAL | Status: DC

## 2013-12-08 MED ORDER — MELOXICAM 7.5 MG PO TABS: 7.5000 mg | ORAL_TABLET | Freq: Every day | ORAL | Status: DC

## 2013-12-08 MED ORDER — HYDRALAZINE HCL 25 MG PO TABS: 25.0000 mg | ORAL_TABLET | Freq: Every day | ORAL | Status: DC

## 2013-12-08 NOTE — Progress Notes (Signed)
Patient ID: MATISYAHU FEWKES, male   DOB: April 22, 1945, 68 y.o.   MRN: [id number] Discharge Note-Seen this am by Heloise Purpura NP and later in the day by Dr. Dwyane Dee and they determined that he can be discharged today. Gershon Mussel H worked to get him into [facility] as the client had requested but they did not get back with Tom by the end ot clients time in the OBS unit. He was given a list of resources including [facility] to pursue once discharged. He is interested in going to [facility] in [place] provided him with a bus ticket and the money for the PART bust to follow up with [facility] tomorrow by 9 am. He dioesnt want to start up drinking again. He is not suicidal or homicidal and is not psychotic. He is currently homeless and plans to go to the shelter tonight. Reviewed with him his discharge plans and he verbalized his understanding. All property returned to him. Conrad NP gave him a RX for his Celexa. He gets his psych outpatient treatment thru [facility].

## 2013-12-08 NOTE — Plan of Care (Addendum)
[facility] Observation Crisis Plan  Reason for Crisis Plan:  Substance Abuse   Plan of Care:  Referral for Substance Abuse  Family Support:    Unspecified family members (partially alienated)  Current Living Environment:   Homeless  Insurance:  [facility] Account    Name Acct ID Class Status Primary Coverage   Lynnox, [place] [id number] [place] - MEDICARE PART A AND B        Guarantor Account (for Hospital Account [id number])    Name Relation to Pt Service Area Active? Acct Type   [name] Self [facility] Yes Behavioral Health   Address Phone       [address] [place], [place] [address] [phone])          Coverage Information (for Hospital Account [id number])    1. [place] PART A AND B    F/O Payor/Plan Precert #   MEDICARE/MEDICARE PART A AND B    Subscriber Subscriber #   Abdulrehman, Inglett [id number]   Address Phone   [address] [place], [place] [address]        2. [place]    F/[place] Payor/Plan Precert #   [place]    Subscriber Subscriber #   Mykai, [place] [id number]   Address Phone   [address] [place], [place] [address] [phone]          Legal Guardian:   Self  Primary Care Provider:  No PCP Per Patient  Current Outpatient Providers:  Beverly Sessions  Psychiatrist:   Beverly Sessions  Counselor/Therapist:   [facility]  Compliant with Medications:  No; off medications for 1 week following substance abuse relapse  Additional Information: After consulting with Catalina Pizza, NP it has been determined that pt does not present a life threatening danger to himself or others, and that psychiatric hospitalization is not indicated for him at this time.  He would, however, benefit from substance abuse rehabilitation treatment.  After discussing options with the pt, he has indicated a preference for residential treatment  specifically at [facility].  Heloise Purpura approves of this, provided that placement can be found in a timely fashion.  At 10:30 I called [facility] and left a message for Whitman Hero.  Pt will be retained in Observation Unit until 15:30 in anticipation of a call back.  If call is not received by that time pt will be discharged with referral information to [facility] and to other similar area providers to follow up at his own initiative.  Jalene Mullet, MA Triage Specialist [place] [place] 11/9/201511:43 AM   Addendum: Earlier today this writer received a call from Whitman Hero at [facility].  He asked that clinical information be sent to him; documentation was subsequently fax.  However, given the pt's pattern of use, he was not optimistic that pt would meet criteria for admission to their facility.  As of this writing Merry Proud has not called back, and pt needs to be discharged from the Observation Unit.  Discharge instructions will include referral information for the residential programs and [facility], as well as outpatient programs offered by [facility], [facility], and [facility], the latter of which also offers transitional housing.  Pt is especially interested in [facility], and is considering staying at [facility] while in treatment.  He will be provided with a [place] bus pass, as well as $3.00 for PART bus fare, along with contact information  for [employer].  Jalene Mullet, MA Triage Specialist 12/08/2013 @ 15:28

## 2013-12-08 NOTE — Progress Notes (Signed)
Patient ID: Bruce Hayes, male   DOB: 03/14/1945, 68 y.o.   MRN: [id number] Denies any complaints related to withdrawal. He has chronic pain in his right ankle from a recent fall of a 4 along with some arthritis pain. He is minimal but pleasant. Sleeping much of the am. He denies any psychosis or dangerousness. He would like to go from here to a rehab program like [facility]. Seen by Heloise Purpura NP this am and he is agreeable to this plan. Waiting on Tom H. To assist with dispositioning.

## 2013-12-08 NOTE — Discharge Instructions (Signed)
To help you maintain a sober lifestyle, a substance abuse treatment program may be helpful to you.  Consider contacting one of the following treatment providers at your earliest opportunity:  Residential programs:       [facility]      [address] [place], [place] [address]      [phone]      Please note that Chinita Pester has already received your clinical information and that you are under consideration for their program.  Stay in touch with them to see what their decision is.       [facility]      [place], [place] [address]      [phone]  Outpatient programs:B       [facility]      [address] [place]. [place], [place] [address]      [phone]       [facility] the [place], [place] [address]      [phone]       [facility]      [address]      [place], [place] [address]      [phone]      [facility] offers outpatient treatment as well as transitional housing.  To start their outpatient program, simply go to their facility any weekday morning, Monday - Friday at 9:00 am.  For transitional housing there is an application process.  Ask about it when you go for treatment.  There is often a wait of several weeks between the time that you apply and when you receive this service.  Many homeless clients choose to stay at the shelter operated [facility], which is within walking distance of [facility]:       [facility]      [address]      [place],  [address]      [phone]

## 2013-12-08 NOTE — H&P (Signed)
[facility] OBS UNIT H&P   Bruce Hayes is an 68 y.o. male. Total Time spent with patient: 45 minutes  Assessment: AXIS I:  Alcohol Abuse, Substance Abuse and Substance Induced Mood Disorder AXIS II:  Deferred AXIS III:   Past Medical History  Diagnosis Date  . Hyperlipidemia   . Hypertension   . Hepatitis C   . Alcohol abuse     cocaine and tobacco  . Obesity   . Syphilis, secondary     treated  . Sebaceous cyst   . Chronic kidney disease     deemed secondary to HCTZ and lisinopril  . Coronary artery disease   . Cirrhosis    AXIS IV:  housing problems, other psychosocial or environmental problems, problems related to social environment and problems with primary support group AXIS V:  61-70 mild symptoms  Plan:  Discharge home with outpatient resources for substance abuse, psychiatry, and counseling. To be assisted by [facility].   Subjective:   Bruce Hayes is a 68 y.o. male patient presents [facility] ED requesting alcohol detox. Pt denies SI, HI, and AVH, contracts for safety. Pt reports that he would like to followup with outpatient counseling and detoxification services for alcohol and substance abuse. He would prefer inpatient, but he does understand that this may not be an option due to waiting lists. Pt is willing to follow-up on his own with facilities and outpatient providers with the assistance of Tom with [facility].   HPI:  Patient states that he drinks almost fifth of liquor daily.  States that his last drink was Thursday (12/04/13). Patient states that he has had detox once and was sober for 3 years afterward.  Patient is seeking long term rehab or outpatient services "I want to get clean."  Patient states that he does have some depression with recent loss of his house because of drinking and is now homeless.  Patient states that he is not speaking with his family at this time until he gets himself cleaned up "They have enough on them right now and don't need no mo problems.  I  want to do this on my on and when I'm clean; I talk with them."   Patient states that he has had suicidal thoughts in the past.  Denies suicidal/homicidal ideation, psychosis, and paranoia.  Patient also denies seizures and any psychiatric history other than alcohol detox.    HPI Elements:   Location:  alcohol abuse. Quality:  daily consumption of alcohol. Severity:  homelessness. Timing:  1 day. Review of Systems  HENT: Negative.   Respiratory: Negative for shortness of breath and wheezing.   Musculoskeletal: Negative.   Neurological: Negative for tremors, seizures and loss of consciousness.  Psychiatric/Behavioral: Positive for depression and substance abuse. Negative for suicidal ideas, hallucinations and memory loss. The patient is not nervous/anxious and does not have insomnia.   All other systems reviewed and are negative.  Family History  Problem Relation Age of Onset  . Asthma Mother   . Arthritis Mother   . Coronary artery disease Mother   . Cancer Father     pancreatic cancer  . Coronary artery disease Daughter     questionable     Past Psychiatric History: Past Medical History  Diagnosis Date  . Hyperlipidemia   . Hypertension   . Hepatitis C   . Alcohol abuse     cocaine and tobacco  . Obesity   . Syphilis, secondary     treated  .  Sebaceous cyst   . Chronic kidney disease     deemed secondary to HCTZ and lisinopril  . Coronary artery disease   . Cirrhosis     reports that he has been smoking Cigarettes.  He has been smoking about 0.25 packs per day. He does not have any smokeless tobacco history on file. He reports that he drinks alcohol. He reports that he uses illicit drugs (Cocaine). Family History  Problem Relation Age of Onset  . Asthma Mother   . Arthritis Mother   . Coronary artery disease Mother   . Cancer Father     pancreatic cancer  . Coronary artery disease Daughter     questionable           Allergies:   Allergies  Allergen  Reactions  . Tylenol [Acetaminophen] Other (See Comments)    Liver condition    ACT Assessment Complete:  Yes:    Educational Status    Risk to Self:    Risk to Others:    Abuse:    Prior Inpatient Therapy:    Prior Outpatient Therapy:    Additional Information:                    Objective: Blood pressure 163/89, pulse 82, temperature 98 F (36.7 C), temperature source Oral, resp. rate 18, height 5\' 11"  (1.803 m), weight 101.152 kg (223 lb), SpO2 98 %.Body mass index is 31.12 kg/(m^2). Results for orders placed or performed during the hospital encounter of 12/06/13 (from the past 72 hour(s))  Drug screen panel, emergency     Status: Abnormal   Collection Time: 12/06/13  6:04 PM  Result Value Ref Range   Opiates NONE DETECTED NONE DETECTED   Cocaine POSITIVE (A) NONE DETECTED   Benzodiazepines NONE DETECTED NONE DETECTED   Amphetamines NONE DETECTED NONE DETECTED   Tetrahydrocannabinol NONE DETECTED NONE DETECTED   Barbiturates NONE DETECTED NONE DETECTED    Comment:        DRUG SCREEN FOR MEDICAL PURPOSES ONLY.  IF CONFIRMATION IS NEEDED FOR ANY PURPOSE, NOTIFY LAB WITHIN 5 DAYS.        LOWEST DETECTABLE LIMITS FOR URINE DRUG SCREEN Drug Class       Cutoff (ng/mL) Amphetamine      1000 Barbiturate      200 Benzodiazepine   [id number] Tricyclics       [id number] Opiates          300 Cocaine          300 THC              50   Ethanol     Status: None   Collection Time: 12/06/13  6:26 PM  Result Value Ref Range   Alcohol, Ethyl (B) <11 0 - 11 mg/dL    Comment:        LOWEST DETECTABLE LIMIT FOR SERUM ALCOHOL IS 11 mg/dL FOR MEDICAL PURPOSES ONLY   Lipase, blood     Status: Abnormal   Collection Time: 12/06/13  6:26 PM  Result Value Ref Range   Lipase 105 (H) 11 - 59 U/L   Labs are reviewed see abnormal values above.  Medications reviewed; no changes made.  Started Librium 25 mg Q 6 hr PRN CIWA.    Current Facility-Administered Medications  Medication Dose  Route Frequency Provider Last Rate Last Dose  . amLODipine (NORVASC) tablet 10 mg  10 mg Oral Daily Nicholaus Bloom, MD   10 mg at 12/08/13  0750  . aspirin EC tablet 325 mg  325 mg Oral Daily Nicholaus Bloom, MD   325 mg at 12/08/13 0751  . atorvastatin (LIPITOR) tablet 10 mg  10 mg Oral q morning - 10a Nicholaus Bloom, MD      . chlorthalidone (HYGROTON) tablet 25 mg  25 mg Oral Daily Nicholaus Bloom, MD   25 mg at 12/08/13 0751  . citalopram (CELEXA) tablet 20 mg  20 mg Oral Daily Nicholaus Bloom, MD   20 mg at 12/08/13 0751  . hydrALAZINE (APRESOLINE) tablet 25 mg  25 mg Oral Daily Nicholaus Bloom, MD   25 mg at 12/08/13 0749  . hydrOXYzine (ATARAX/VISTARIL) tablet 25 mg  25 mg Oral Q6H PRN Nicholaus Bloom, MD      . loperamide (IMODIUM) capsule 2-4 mg  2-4 mg Oral PRN Nicholaus Bloom, MD      . LORazepam (ATIVAN) tablet 1 mg  1 mg Oral Q6H PRN Nicholaus Bloom, MD      . LORazepam (ATIVAN) tablet 1 mg  1 mg Oral QID Nicholaus Bloom, MD   1 mg at 12/08/13 0750   Followed by  . [START ON 12/09/2013] LORazepam (ATIVAN) tablet 1 mg  1 mg Oral TID Nicholaus Bloom, MD       Followed by  . [START ON 12/10/2013] LORazepam (ATIVAN) tablet 1 mg  1 mg Oral BID Nicholaus Bloom, MD       Followed by  . [START ON 12/12/2013] LORazepam (ATIVAN) tablet 1 mg  1 mg Oral Daily Nicholaus Bloom, MD      . meloxicam [facility]) tablet 7.5 mg  7.5 mg Oral Daily Nicholaus Bloom, MD   7.5 mg at 12/08/13 0749  . multivitamin with minerals tablet 1 tablet  1 tablet Oral Daily Nicholaus Bloom, MD   1 tablet at 12/08/13 0750  . ondansetron (ZOFRAN-ODT) disintegrating tablet 4 mg  4 mg Oral Q6H PRN Nicholaus Bloom, MD      . thiamine (VITAMIN B-1) tablet 100 mg  100 mg Oral Daily Nicholaus Bloom, MD   100 mg at 12/08/13 0750    Psychiatric Specialty Exam:     Blood pressure 163/89, pulse 82, temperature 98 F (36.7 C), temperature source Oral, resp. rate 18, height 5\' 11"  (1.803 m), weight 101.152 kg (223 lb), SpO2 98 %.Body mass index is 31.12  kg/(m^2).  General Appearance: Casual and Fairly Groomed  Eye Contact::  Good  Speech:  Clear and Coherent and Normal Rate  Volume:  Normal  Mood:  Depressed  Affect:  Congruent  Thought Process:  Circumstantial and Goal Directed  Orientation:  Full (Time, Place, and Person)  Thought Content:  Rumination  Suicidal Thoughts:  No  Homicidal Thoughts:  No  Memory:  Immediate;   Good Recent;   Good Remote;   Good  Judgement:  Fair  Insight:  Fair  Psychomotor Activity:  Normal  Concentration:  Fair  Recall:  Good  Fund of Knowledge:Fair  Language: Good  Akathisia:  No  Handed:  Right  AIMS (if indicated):     Assets:  Communication Skills Desire for Improvement Resilience  Sleep:      Musculoskeletal: Strength & Muscle Tone: within normal limits Gait & Station: normal Patient leans: N/A  Treatment Plan Summary:  Discharge home with outpatient resources for substance abuse, psychiatry, and counseling. To be assisted by [facility].   [place], Elyse Jarvis,  FNP-BC 12/08/2013 8:40 AM

## 2013-12-08 NOTE — Discharge Summary (Signed)
[facility] UN[facility] DISCHARGE SUMMARY   MAEL MILCH is an 68 y.o. male. Total Time spent with patient: 45 minutes  Assessment: AXIS I:  Alcohol Abuse, Substance Abuse and Substance Induced Mood Disorder AXIS II:  Deferred AXIS III:   Past Medical History  Diagnosis Date  . Hyperlipidemia   . Hypertension   . Hepatitis C   . Alcohol abuse     cocaine and tobacco  . Obesity   . Syphilis, secondary     treated  . Sebaceous cyst   . Chronic kidney disease     deemed secondary to HCTZ and lisinopril  . Coronary artery disease   . Cirrhosis    AXIS IV:  housing problems, other psychosocial or environmental problems, problems related to social environment and problems with primary support group AXIS V:  61-70 mild symptoms  Plan:  Discharge home with outpatient resources for substance abuse, psychiatry, and counseling. To be assisted by [facility].   Subjective:   REG UHLAND is a 68 y.o. male patient presents [facility] ED requesting alcohol detox. Pt denies SI, HI, and AVH, contracts for safety. Pt denies any withdrawal symptoms; none noted during assessment. Pt reports that he would like to followup with outpatient counseling and detoxification services for alcohol and substance abuse. He would prefer inpatient, but he does understand that this may not be an option due to waiting lists. Pt is willing to follow-up on his own with facilities and outpatient providers with the assistance of Tom with [facility]. Gershon Mussel will work on this plan for referrals until approximately 3:30PM to allow for adequate time for facilities to respond to these requests. At this time, if placement inpatient is not secured, pt will continue the process at home.   HPI:  Patient states that he drinks almost fifth of liquor daily.  States that his last drink was Thursday (12/04/13). Patient states that he has had detox once and was sober for 3 years afterward.  Patient is seeking long term rehab or outpatient services "I want  to get clean."  Patient states that he does have some depression with recent loss of his house because of drinking and is now homeless.  Patient states that he is not speaking with his family at this time until he gets himself cleaned up "They have enough on them right now and don't need no mo problems.  I want to do this on my on and when I'm clean; I talk with them."   Patient states that he has had suicidal thoughts in the past.  Denies suicidal/homicidal ideation, psychosis, and paranoia.  Patient also denies seizures and any psychiatric history other than alcohol detox.    HPI Elements:   Location:  alcohol abuse. Quality:  daily consumption of alcohol. Severity:  homelessness. Timing:  1 day. Review of Systems  HENT: Negative.   Respiratory: Negative for shortness of breath and wheezing.   Musculoskeletal: Negative.   Neurological: Negative for tremors, seizures and loss of consciousness.  Psychiatric/Behavioral: Positive for depression and substance abuse. Negative for suicidal ideas, hallucinations and memory loss. The patient is not nervous/anxious and does not have insomnia.   All other systems reviewed and are negative.  Family History  Problem Relation Age of Onset  . Asthma Mother   . Arthritis Mother   . Coronary artery disease Mother   . Cancer Father     pancreatic cancer  . Coronary artery disease Daughter     questionable  Past Psychiatric History: Past Medical History  Diagnosis Date  . Hyperlipidemia   . Hypertension   . Hepatitis C   . Alcohol abuse     cocaine and tobacco  . Obesity   . Syphilis, secondary     treated  . Sebaceous cyst   . Chronic kidney disease     deemed secondary to HCTZ and lisinopril  . Coronary artery disease   . Cirrhosis     reports that he has been smoking Cigarettes.  He has been smoking about 0.25 packs per day. He does not have any smokeless tobacco history on file. He reports that he drinks alcohol. He reports that he  uses illicit drugs (Cocaine). Family History  Problem Relation Age of Onset  . Asthma Mother   . Arthritis Mother   . Coronary artery disease Mother   . Cancer Father     pancreatic cancer  . Coronary artery disease Daughter     questionable           Allergies:   Allergies  Allergen Reactions  . Tylenol [Acetaminophen] Other (See Comments)    Liver condition    ACT Assessment Complete:  Yes:    Educational Status    Risk to Self:    Risk to Others:    Abuse:    Prior Inpatient Therapy:    Prior Outpatient Therapy:    Additional Information:                    Objective: Blood pressure 163/89, pulse 82, temperature 98 F (36.7 C), temperature source Oral, resp. rate 18, height 5\' 11"  (1.803 m), weight 101.152 kg (223 lb), SpO2 98 %.Body mass index is 31.12 kg/(m^2). Results for orders placed or performed during the hospital encounter of 12/06/13 (from the past 72 hour(s))  Drug screen panel, emergency     Status: Abnormal   Collection Time: 12/06/13  6:04 PM  Result Value Ref Range   Opiates NONE DETECTED NONE DETECTED   Cocaine POSITIVE (A) NONE DETECTED   Benzodiazepines NONE DETECTED NONE DETECTED   Amphetamines NONE DETECTED NONE DETECTED   Tetrahydrocannabinol NONE DETECTED NONE DETECTED   Barbiturates NONE DETECTED NONE DETECTED    Comment:        DRUG SCREEN FOR MEDICAL PURPOSES ONLY.  IF CONFIRMATION IS NEEDED FOR ANY PURPOSE, NOTIFY LAB WITHIN 5 DAYS.        LOWEST DETECTABLE LIMITS FOR URINE DRUG SCREEN Drug Class       Cutoff (ng/mL) Amphetamine      1000 Barbiturate      200 Benzodiazepine   [id number] Tricyclics       [id number] Opiates          300 Cocaine          300 THC              50   Ethanol     Status: None   Collection Time: 12/06/13  6:26 PM  Result Value Ref Range   Alcohol, Ethyl (B) <11 0 - 11 mg/dL    Comment:        LOWEST DETECTABLE LIMIT FOR SERUM ALCOHOL IS 11 mg/dL FOR MEDICAL PURPOSES ONLY   Lipase, blood      Status: Abnormal   Collection Time: 12/06/13  6:26 PM  Result Value Ref Range   Lipase 105 (H) 11 - 59 U/L   Labs are reviewed see abnormal values above.  Medications reviewed; no changes made.  Started Librium 25 mg Q 6 hr PRN CIWA.    Current Facility-Administered Medications  Medication Dose Route Frequency Provider Last Rate Last Dose  . amLODipine (NORVASC) tablet 10 mg  10 mg Oral Daily Nicholaus Bloom, MD   10 mg at 12/08/13 0750  . aspirin EC tablet 325 mg  325 mg Oral Daily Nicholaus Bloom, MD   325 mg at 12/08/13 0751  . atorvastatin (LIPITOR) tablet 10 mg  10 mg Oral q morning - 10a Nicholaus Bloom, MD      . chlorthalidone (HYGROTON) tablet 25 mg  25 mg Oral Daily Nicholaus Bloom, MD   25 mg at 12/08/13 0751  . citalopram (CELEXA) tablet 20 mg  20 mg Oral Daily Nicholaus Bloom, MD   20 mg at 12/08/13 0751  . hydrALAZINE (APRESOLINE) tablet 25 mg  25 mg Oral Daily Nicholaus Bloom, MD   25 mg at 12/08/13 0749  . hydrOXYzine (ATARAX/VISTARIL) tablet 25 mg  25 mg Oral Q6H PRN Nicholaus Bloom, MD      . loperamide (IMODIUM) capsule 2-4 mg  2-4 mg Oral PRN Nicholaus Bloom, MD      . LORazepam (ATIVAN) tablet 1 mg  1 mg Oral Q6H PRN Nicholaus Bloom, MD      . LORazepam (ATIVAN) tablet 1 mg  1 mg Oral QID Nicholaus Bloom, MD   1 mg at 12/08/13 0750   Followed by  . [START ON 12/09/2013] LORazepam (ATIVAN) tablet 1 mg  1 mg Oral TID Nicholaus Bloom, MD       Followed by  . [START ON 12/10/2013] LORazepam (ATIVAN) tablet 1 mg  1 mg Oral BID Nicholaus Bloom, MD       Followed by  . [START ON 12/12/2013] LORazepam (ATIVAN) tablet 1 mg  1 mg Oral Daily Nicholaus Bloom, MD      . meloxicam [facility]) tablet 7.5 mg  7.5 mg Oral Daily Nicholaus Bloom, MD   7.5 mg at 12/08/13 0749  . multivitamin with minerals tablet 1 tablet  1 tablet Oral Daily Nicholaus Bloom, MD   1 tablet at 12/08/13 0750  . ondansetron (ZOFRAN-ODT) disintegrating tablet 4 mg  4 mg Oral Q6H PRN Nicholaus Bloom, MD      . thiamine (VITAMIN B-1) tablet 100  mg  100 mg Oral Daily Nicholaus Bloom, MD   100 mg at 12/08/13 0750    Psychiatric Specialty Exam:     Blood pressure 163/89, pulse 82, temperature 98 F (36.7 C), temperature source Oral, resp. rate 18, height 5\' 11"  (1.803 m), weight 101.152 kg (223 lb), SpO2 98 %.Body mass index is 31.12 kg/(m^2).  General Appearance: Casual and Fairly Groomed  Engineer, water::  Good  Speech:  Clear and Coherent and Normal Rate  Volume:  Normal  Mood:  Depressed  Affect:  Congruent  Thought Process:  Circumstantial and Goal Directed  Orientation:  Full (Time, Place, and Person)  Thought Content:  WNL  Suicidal Thoughts:  No  Homicidal Thoughts:  No  Memory:  Immediate;   Good Recent;   Good Remote;   Good  Judgement:  Fair  Insight:  Fair  Psychomotor Activity:  Normal  Concentration:  Fair  Recall:  Good  Fund of Knowledge:Fair  Language: Good  Akathisia:  No  Handed:  Right  AIMS (if indicated):     Assets:  Communication Skills Desire for Improvement Resilience  Sleep:  Musculoskeletal: Strength & Muscle Tone: within normal limits Gait & Station: normal Patient leans: N/A  Treatment Plan Summary:  Discharge home with outpatient resources for substance abuse, psychiatry, and counseling. To be assisted by [facility].   Benjamine Mola, FNP-BC 12/08/2013 12:26 PM

## 2014-01-14 ENCOUNTER — Encounter (HOSPITAL_COMMUNITY): Payer: Self-pay | Admitting: Family Medicine

## 2014-01-14 ENCOUNTER — Emergency Department (HOSPITAL_COMMUNITY)
Admission: EM | Admit: 2014-01-14 | Discharge: 2014-01-14 | Disposition: A | Discharge status: Home / Self Care | Payer: Medicare Other | Attending: Emergency Medicine | Admitting: Emergency Medicine

## 2014-01-14 DIAGNOSIS — Z8619 Personal history of other infectious and parasitic diseases: Secondary | ICD-10-CM | POA: Insufficient documentation

## 2014-01-14 DIAGNOSIS — Z872 Personal history of diseases of the skin and subcutaneous tissue: Secondary | ICD-10-CM | POA: Diagnosis not present

## 2014-01-14 DIAGNOSIS — M79671 Pain in right foot: Secondary | ICD-10-CM

## 2014-01-14 DIAGNOSIS — Z720 Tobacco use: Secondary | ICD-10-CM | POA: Diagnosis not present

## 2014-01-14 DIAGNOSIS — E785 Hyperlipidemia, unspecified: Secondary | ICD-10-CM | POA: Diagnosis not present

## 2014-01-14 DIAGNOSIS — N189 Chronic kidney disease, unspecified: Secondary | ICD-10-CM | POA: Diagnosis not present

## 2014-01-14 DIAGNOSIS — Z7982 Long term (current) use of aspirin: Secondary | ICD-10-CM | POA: Insufficient documentation

## 2014-01-14 DIAGNOSIS — I2510 Atherosclerotic heart disease of native coronary artery without angina pectoris: Secondary | ICD-10-CM | POA: Insufficient documentation

## 2014-01-14 DIAGNOSIS — Z8719 Personal history of other diseases of the digestive system: Secondary | ICD-10-CM | POA: Diagnosis not present

## 2014-01-14 DIAGNOSIS — I129 Hypertensive chronic kidney disease with stage 1 through stage 4 chronic kidney disease, or unspecified chronic kidney disease: Secondary | ICD-10-CM | POA: Insufficient documentation

## 2014-01-14 DIAGNOSIS — E669 Obesity, unspecified: Secondary | ICD-10-CM | POA: Insufficient documentation

## 2014-01-14 DIAGNOSIS — M79604 Pain in right leg: Secondary | ICD-10-CM | POA: Diagnosis present

## 2014-01-14 DIAGNOSIS — I10 Essential (primary) hypertension: Secondary | ICD-10-CM

## 2014-01-14 MED ORDER — AMLODIPINE BESYLATE 10 MG PO TABS: 10.0000 mg | ORAL_TABLET | Freq: Every day | ORAL | Status: DC

## 2014-01-14 MED ORDER — HYDROCODONE-ACETAMINOPHEN 5-325 MG PO TABS
1.0000 | ORAL_TABLET | Freq: Once | ORAL | Status: AC
  Administered 2014-01-14: 1 via ORAL
  Filled 2014-01-14: qty 1

## 2014-01-14 NOTE — Discharge Planning (Signed)
12/16/15[name], [place], [place], [place] [phone]. Pt qualifies for DME cane.  DME cane ordered through [place]. Melene Muller of [facility] notified to deliver cane to pt room prior to D/C home.

## 2014-01-14 NOTE — ED Notes (Signed)
MD Zavitz at bedside  

## 2014-01-14 NOTE — Discharge Instructions (Signed)
If you were given medicines take as directed.  If you are on coumadin or contraceptives realize their levels and effectiveness is altered by many different medicines.  If you have any reaction (rash, tongues swelling, other) to the medicines stop taking and see a physician.   Discuss blood pressure management with your doctor, start taking medicine until you see your doctor.  Please follow up as directed and return to the ER or see a physician for new or worsening symptoms.  Thank you. Filed Vitals:   01/14/14 0957  BP: 177/89  Pulse: 87  Temp: 98.6 F (37 C)  Resp: 18  SpO2: 95%    Chemical Burn Chemicals can burn the skin. A chemical burn should be rinsed with cool water and checked by an emergency doctor. Burn care is important to stop infection. Keep chemicals out of reach of children. Wear safety gloves when handling chemicals. HOME CARE  Wash your hands well before you change your bandage.  Change your bandage as often as told by your doctor.  Remove the old bandage. If the bandage sticks, soak it off with cool, clean water.  Gently clean the burn with mild soap and water.  Pat the burn dry with a clean, dry cloth.  Put a thin layer of medicated cream on the burn.  Put a clean bandage on as told by your doctor.  Keep the bandage clean and dry.  Raise (elevate) the burn for the first 24 hours. After that, follow your doctor's directions.  Only take medicines as told by your doctor.  Keep all your doctor visits. GET HELP RIGHT AWAY IF:  You have too much pain.  The skin near the burn is red, tender, puffy (swollen), or has red streaks.  The burn area has yellowish-white fluid (pus) or a bad smell coming from it.  You have a fever. MAKE SURE YOU:   Understand these instructions.  Will watch your condition.  Will get help right away if you are not doing well or get worse. Document Released: 02/24/2004 Document Revised: 04/10/2011 Document Reviewed:  07/14/2010 [facility] Patient Information 2015 [place], [place]. This information is not intended to replace advice given to you by your health care provider. Make sure you discuss any questions you have with your health care provider.
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
## 2014-01-14 NOTE — ED Notes (Signed)
Consulted with case management/social work to get a cane for patient to go home with.
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
## 2014-02-03 ENCOUNTER — Encounter (HOSPITAL_COMMUNITY): Payer: Self-pay | Admitting: Emergency Medicine

## 2014-02-03 ENCOUNTER — Emergency Department (HOSPITAL_COMMUNITY)
Admission: EM | Admit: 2014-02-03 | Discharge: 2014-02-05 | Disposition: A | Discharge status: Home / Self Care | Payer: Medicare Other | Attending: Emergency Medicine | Admitting: Emergency Medicine

## 2014-02-03 DIAGNOSIS — Z7982 Long term (current) use of aspirin: Secondary | ICD-10-CM | POA: Insufficient documentation

## 2014-02-03 DIAGNOSIS — Z872 Personal history of diseases of the skin and subcutaneous tissue: Secondary | ICD-10-CM | POA: Insufficient documentation

## 2014-02-03 DIAGNOSIS — I2510 Atherosclerotic heart disease of native coronary artery without angina pectoris: Secondary | ICD-10-CM | POA: Diagnosis not present

## 2014-02-03 DIAGNOSIS — E669 Obesity, unspecified: Secondary | ICD-10-CM | POA: Insufficient documentation

## 2014-02-03 DIAGNOSIS — E785 Hyperlipidemia, unspecified: Secondary | ICD-10-CM | POA: Diagnosis not present

## 2014-02-03 DIAGNOSIS — Z720 Tobacco use: Secondary | ICD-10-CM | POA: Diagnosis not present

## 2014-02-03 DIAGNOSIS — Z87828 Personal history of other (healed) physical injury and trauma: Secondary | ICD-10-CM | POA: Diagnosis not present

## 2014-02-03 DIAGNOSIS — F1010 Alcohol abuse, uncomplicated: Secondary | ICD-10-CM

## 2014-02-03 DIAGNOSIS — F1410 Cocaine abuse, uncomplicated: Secondary | ICD-10-CM | POA: Diagnosis not present

## 2014-02-03 DIAGNOSIS — Z8619 Personal history of other infectious and parasitic diseases: Secondary | ICD-10-CM | POA: Diagnosis not present

## 2014-02-03 DIAGNOSIS — Z8719 Personal history of other diseases of the digestive system: Secondary | ICD-10-CM | POA: Insufficient documentation

## 2014-02-03 DIAGNOSIS — Z79899 Other long term (current) drug therapy: Secondary | ICD-10-CM | POA: Diagnosis not present

## 2014-02-03 DIAGNOSIS — N189 Chronic kidney disease, unspecified: Secondary | ICD-10-CM | POA: Diagnosis not present

## 2014-02-03 DIAGNOSIS — I4949 Other premature depolarization: Secondary | ICD-10-CM | POA: Diagnosis not present

## 2014-02-03 DIAGNOSIS — I129 Hypertensive chronic kidney disease with stage 1 through stage 4 chronic kidney disease, or unspecified chronic kidney disease: Secondary | ICD-10-CM | POA: Insufficient documentation

## 2014-02-03 HISTORY — DX: Accidental discharge from unspecified firearms or gun, initial encounter: W34.00XA

## 2014-02-03 LAB — RAPID URINE DRUG SCREEN, HOSP PERFORMED
Amphetamines: NOT DETECTED
BARBITURATES: NOT DETECTED
Benzodiazepines: NOT DETECTED
Cocaine: POSITIVE — AB
Opiates: NOT DETECTED
Tetrahydrocannabinol: NOT DETECTED

## 2014-02-03 LAB — COMPREHENSIVE METABOLIC PANEL
ALBUMIN: 2.7 g/dL — AB (ref 3.5–5.2)
ALK PHOS: 132 U/L — AB (ref 39–117)
ALT: 50 U/L (ref 0–53)
AST: 80 U/L — ABNORMAL HIGH (ref 0–37)
Anion gap: 7 (ref 5–15)
BILIRUBIN TOTAL: 0.8 mg/dL (ref 0.3–1.2)
BUN: 17 mg/dL (ref 6–23)
CALCIUM: 8.3 mg/dL — AB (ref 8.4–10.5)
CO2: 23 mmol/L (ref 19–32)
Chloride: 113 mEq/L — ABNORMAL HIGH (ref 96–112)
Creatinine, Ser: 1.54 mg/dL — ABNORMAL HIGH (ref 0.50–1.35)
GFR calc Af Amer: 53 mL/min — ABNORMAL LOW (ref >90)
GFR, EST NON AFRICAN AMERICAN: 45 mL/min — AB (ref >90)
Glucose, Bld: 120 mg/dL — ABNORMAL HIGH (ref 70–99)
POTASSIUM: 4.2 mmol/L (ref 3.5–5.1)
Sodium: 143 mmol/L (ref 135–145)
TOTAL PROTEIN: 6.8 g/dL (ref 6.0–8.3)

## 2014-02-03 LAB — CBC
HCT: 35.9 % — ABNORMAL LOW (ref 39.0–52.0)
HEMOGLOBIN: 12.7 g/dL — AB (ref 13.0–17.0)
MCH: 28.9 pg (ref 26.0–34.0)
MCHC: 35.4 g/dL (ref 30.0–36.0)
MCV: 81.6 fL (ref 78.0–100.0)
PLATELETS: 125 10*3/uL — AB (ref 150–400)
RBC: 4.4 MIL/uL (ref 4.22–5.81)
RDW: 15.1 % (ref 11.5–15.5)
WBC: 6.9 10*3/uL (ref 4.0–10.5)

## 2014-02-03 LAB — ACETAMINOPHEN LEVEL: Acetaminophen (Tylenol), Serum: <10 ug/mL — ABNORMAL LOW (ref 10–30)

## 2014-02-03 LAB — ETHANOL: Alcohol, Ethyl (B): <5 mg/dL (ref 0–9)

## 2014-02-03 LAB — SALICYLATE LEVEL

## 2014-02-03 MED ORDER — CHLORTHALIDONE 25 MG PO TABS
25.0000 mg | ORAL_TABLET | Freq: Every day | ORAL | Status: DC
  Administered 2014-02-03 – 2014-02-04 (×2): 25 mg via ORAL
  Filled 2014-02-03 (×6): qty 1

## 2014-02-03 MED ORDER — LORAZEPAM 1 MG PO TABS
0.0000 mg | ORAL_TABLET | Freq: Four times a day (QID) | ORAL | Status: DC
  Administered 2014-02-03: 1 mg via ORAL
  Filled 2014-02-03: qty 1

## 2014-02-03 MED ORDER — IBUPROFEN 400 MG PO TABS
600.0000 mg | ORAL_TABLET | Freq: Three times a day (TID) | ORAL | Status: DC | PRN
  Administered 2014-02-03 – 2014-02-04 (×2): 600 mg via ORAL
  Filled 2014-02-03 (×4): qty 1

## 2014-02-03 MED ORDER — LORAZEPAM 1 MG PO TABS: 0.0000 mg | ORAL_TABLET | Freq: Two times a day (BID) | ORAL | Status: DC

## 2014-02-03 MED ORDER — ONDANSETRON HCL 4 MG PO TABS: 4.0000 mg | ORAL_TABLET | Freq: Three times a day (TID) | ORAL | Status: DC | PRN

## 2014-02-03 MED ORDER — AMLODIPINE BESYLATE 5 MG PO TABS
10.0000 mg | ORAL_TABLET | Freq: Every day | ORAL | Status: DC
  Administered 2014-02-03 – 2014-02-04 (×2): 10 mg via ORAL
  Filled 2014-02-03 (×3): qty 2

## 2014-02-03 MED ORDER — NICOTINE 21 MG/24HR TD PT24: 21.0000 mg | MEDICATED_PATCH | Freq: Every day | TRANSDERMAL | Status: DC

## 2014-02-03 MED ORDER — THIAMINE HCL 100 MG/ML IJ SOLN
100.0000 mg | Freq: Every day | INTRAMUSCULAR | Status: DC
  Filled 2014-02-03: qty 2

## 2014-02-03 MED ORDER — VITAMIN B-1 100 MG PO TABS
100.0000 mg | ORAL_TABLET | Freq: Every day | ORAL | Status: DC
  Administered 2014-02-03 – 2014-02-04 (×2): 100 mg via ORAL
  Filled 2014-02-03 (×2): qty 1
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
## 2014-02-03 NOTE — ED Notes (Signed)
Attempted call to [facility], no answer.
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
## 2014-02-03 NOTE — ED Notes (Signed)
Pt reports he drinks a fifth of brandy a day. Has place at [facility] but needs detox first. Was told to get caseworker to get paperwork together for inpatient services.

## 2014-02-03 NOTE — ED Notes (Signed)
STATES HAS NOT BEEN TAKING HIS MEDS.

## 2014-02-03 NOTE — ED Provider Notes (Signed)
Bruce Hayes initial eval was performed. They requested consideration of relatively low platelets and mild CKD.  Both of these values are consistent with multiple lab draws over the past two years w/o acute changes. He will be reassessed in the AM by [facility] for placement.  Carmin Muskrat, MD 02/03/14 [phone]

## 2014-02-03 NOTE — BH Assessment (Signed)
Spoke with ED nurse Jaclynn Guarneri", whom is in agreement with setting up tele-psych cart for assessment to begin.  Shaune Pollack, MS, [place] Assessment Counselor

## 2014-02-03 NOTE — BH Assessment (Signed)
Consulted with EDP Dr.Lockwood and informed him of concerns that Dr.Akintayo had regarding pt's Platelets and Creatine levels. Per doctor Vanita Panda he reports that these issues are Chronic for patient. Informed Dr.Lockwood of Dr.Akintayo's recommendation for patient to remain in ER overnight to be evaluated in the morning by psychiatry (TP).  Shaune Pollack, MS, [place] Assessment Counselor

## 2014-02-04 LAB — BASIC METABOLIC PANEL
Anion gap: 6 (ref 5–15)
BUN: 18 mg/dL (ref 6–23)
CO2: 24 mmol/L (ref 19–32)
Calcium: 8.3 mg/dL — ABNORMAL LOW (ref 8.4–10.5)
Chloride: 110 mEq/L (ref 96–112)
Creatinine, Ser: 1.3 mg/dL (ref 0.50–1.35)
GFR calc Af Amer: 64 mL/min — ABNORMAL LOW (ref >90)
GFR, EST NON AFRICAN AMERICAN: 56 mL/min — AB (ref >90)
Glucose, Bld: 91 mg/dL (ref 70–99)
POTASSIUM: 3.9 mmol/L (ref 3.5–5.1)
SODIUM: 140 mmol/L (ref 135–145)

## 2014-02-04 LAB — CBC
HEMATOCRIT: 35.1 % — AB (ref 39.0–52.0)
Hemoglobin: 12.4 g/dL — ABNORMAL LOW (ref 13.0–17.0)
MCH: 28.6 pg (ref 26.0–34.0)
MCHC: 35.3 g/dL (ref 30.0–36.0)
MCV: 80.9 fL (ref 78.0–100.0)
Platelets: 95 10*3/uL — ABNORMAL LOW (ref 150–400)
RBC: 4.34 MIL/uL (ref 4.22–5.81)
RDW: 14.9 % (ref 11.5–15.5)
WBC: 7.1 10*3/uL (ref 4.0–10.5)

## 2014-02-04 MED ORDER — HYDRALAZINE HCL 25 MG PO TABS: 25.0000 mg | ORAL_TABLET | Freq: Three times a day (TID) | ORAL | Status: DC

## 2014-02-04 MED ORDER — AMLODIPINE BESYLATE 10 MG PO TABS: 10.0000 mg | ORAL_TABLET | Freq: Every day | ORAL | Status: DC

## 2014-02-04 MED ORDER — CITALOPRAM HYDROBROMIDE 10 MG PO TABS
20.0000 mg | ORAL_TABLET | Freq: Every day | ORAL | Status: DC
Start: 2014-02-04 — End: 2014-02-05
  Administered 2014-02-04: 20 mg via ORAL
  Filled 2014-02-04: qty 2

## 2014-02-04 MED ORDER — HYDRALAZINE HCL 25 MG PO TABS
25.0000 mg | ORAL_TABLET | Freq: Three times a day (TID) | ORAL | Status: DC
  Administered 2014-02-04 – 2014-02-05 (×4): 25 mg via ORAL
  Filled 2014-02-04 (×9): qty 1

## 2014-02-04 MED ORDER — CITALOPRAM HYDROBROMIDE 20 MG PO TABS: 20.0000 mg | ORAL_TABLET | Freq: Every day | ORAL | Status: DC

## 2014-02-04 MED ORDER — CHLORTHALIDONE 25 MG PO TABS: 25.0000 mg | ORAL_TABLET | Freq: Every day | ORAL | Status: DC

## 2014-02-04 NOTE — ED Notes (Signed)
CALLED [facility] TO FIND OUT BED AVAILABILITY. UNABLE TO SPEAK WITH ANYONE. MESSAGE LEFT FOR THEM TO CALL BACK

## 2014-02-04 NOTE — ED Provider Notes (Signed)
Patient awaiting placement for alcohol detox. He has been moderately hypertensive since arrival. Patient reports that he has been on labetalol and past, has been off for several weeks. Labetalol will be restarted.  Filed Vitals:   02/04/14 0552  BP: 170/78  Pulse: 69  Temp: 97.7 F (36.5 C)  Resp: 18     Orpah Greek, MD 02/04/14 [phone]

## 2014-02-04 NOTE — ED Notes (Signed)
Pt. Speaking with Telepsych

## 2014-02-04 NOTE — Progress Notes (Signed)
LCSW met with patient regarding detox disposition and treatment for alcohol. Patient agreeable for [facility], RN has spoken to [facility] and bed is available on 1/7 and patient will leave early from the ED to treatment bed.   Patient requested a sweatshirt in which LCSW will provide. Once medications are available, LCSW will send to pharmacy to fill 14 day supply.  Patient reports he is motivated to change, working on securing housing in [place] and is hooked up with Chief Financial Officer.  He is also open to [place] meetings in which list will be given to find a sponsor once treatment has been completed.  Patient will have taxi voucher to transport in the AM. RN to call for transport.   Lane Hacker, MSW Clinical Social Work: Emergency Room [phone]

## 2014-02-04 NOTE — ED Notes (Signed)
SPOKE TO Myrene Buddy [phone]) AT [facility]. SHE SAYES TO HAVE THE PATIENT OUT AT THEIR FACILITY BY 8 AM IN THE MORNING FOR ASSESSMENT. HE WILL ALSO NEED A 14 DAY SUPPLY OF HIS DAILY MEDICATONS. SHE ADVISES THE PATIENT SHOULD BE PREPARED TO STAY FOR TREATMENT. SHE ALSO ASKS WE CONFIRM HIS MEDICARE IS THRU [place]. SOCIAL WORKER HAS BEEN GIVEN HIS RX TO FILL AT OUR PHARMACY AND SHE IS GOING TO ARRANGE FOR A CAB VOUCHER FOR IN THE MORNING. PT IS AWARE AND AGREEABLE

## 2014-02-04 NOTE — ED Notes (Signed)
Pt talking with Education officer, museum.

## 2014-02-04 NOTE — ED Notes (Signed)
Pt. States first drink was at age 69. Pt. Drinks 1/5 of brandy daily. States his drinking elevated when he returned from the [place] war. Pt. Had one extended period of sobriety approx 6 years ago for 3 years when he went to [facility] and was sent to the fellowship home in [place]. Pt. Began drinking again when he graduated from the house and was living independently.

## 2014-02-04 NOTE — Discharge Instructions (Signed)
Alcohol Use Disorder Alcohol use disorder is a mental disorder. It is not a one-time incident of heavy drinking. Alcohol use disorder is the excessive and uncontrollable use of alcohol over time that leads to problems with functioning in one or more areas of daily living. People with this disorder risk harming themselves and others when they drink to excess. Alcohol use disorder also can cause other mental disorders, such as mood and anxiety disorders, and serious physical problems. People with alcohol use disorder often misuse other drugs.  Alcohol use disorder is common and widespread. Some people with this disorder drink alcohol to cope with or escape from negative life events. Others drink to relieve chronic pain or symptoms of mental illness. People with a family history of alcohol use disorder are at higher risk of losing control and using alcohol to excess.  SYMPTOMS  Signs and symptoms of alcohol use disorder may include the following:   Consumption ofalcohol inlarger amounts or over a longer period of time than intended.  Multiple unsuccessful attempts to cutdown or control alcohol use.   A great deal of time spent obtaining alcohol, using alcohol, or recovering from the effects of alcohol (hangover).  A strong desire or urge to use alcohol (cravings).   Continued use of alcohol despite problems at work, school, or home because of alcohol use.   Continued use of alcohol despite problems in relationships because of alcohol use.  Continued use of alcohol in situations when it is physically hazardous, such as driving a car.  Continued use of alcohol despite awareness of a physical or psychological problem that is likely related to alcohol use. Physical problems related to alcohol use can involve the brain, heart, liver, stomach, and intestines. Psychological problems related to alcohol use include intoxication, depression, anxiety, psychosis, delirium, and dementia.   The need for  increased amounts of alcohol to achieve the same desired effect, or a decreased effect from the consumption of the same amount of alcohol (tolerance).  Withdrawal symptoms upon reducing or stopping alcohol use, or alcohol use to reduce or avoid withdrawal symptoms. Withdrawal symptoms include:  Racing heart.  Hand tremor.  Difficulty sleeping.  Nausea.  Vomiting.  Hallucinations.  Restlessness.  Seizures. DIAGNOSIS Alcohol use disorder is diagnosed through an assessment by your health care provider. Your health care provider may start by asking three or four questions to screen for excessive or problematic alcohol use. To confirm a diagnosis of alcohol use disorder, at least two symptoms must be present within a 12-month period. The severity of alcohol use disorder depends on the number of symptoms:  Mild--two or three.  Moderate--four or five.  Severe--six or more. Your health care provider may perform a physical exam or use results from lab tests to see if you have physical problems resulting from alcohol use. Your health care provider may refer you to a mental health professional for evaluation. TREATMENT  Some people with alcohol use disorder are able to reduce their alcohol use to low-risk levels. Some people with alcohol use disorder need to quit drinking alcohol. When necessary, mental health professionals with specialized training in substance use treatment can help. Your health care provider can help you decide how severe your alcohol use disorder is and what type of treatment you need. The following forms of treatment are available:   Detoxification. Detoxification involves the use of prescription medicines to prevent alcohol withdrawal symptoms in the first week after quitting. This is important for people with a history of symptoms   of withdrawal and for heavy drinkers who are likely to have withdrawal symptoms. Alcohol withdrawal can be dangerous and, in severe cases, cause  death. Detoxification is usually provided in a hospital or in-patient substance use treatment facility.  Counseling or talk therapy. Talk therapy is provided by substance use treatment counselors. It addresses the reasons people use alcohol and ways to keep them from drinking again. The goals of talk therapy are to help people with alcohol use disorder find healthy activities and ways to cope with life stress, to identify and avoid triggers for alcohol use, and to handle cravings, which can cause relapse.  Medicines.Different medicines can help treat alcohol use disorder through the following actions:  Decrease alcohol cravings.  Decrease the positive reward response felt from alcohol use.  Produce an uncomfortable physical reaction when alcohol is used (aversion therapy).  Support groups. Support groups are run by people who have quit drinking. They provide emotional support, advice, and guidance. These forms of treatment are often combined. Some people with alcohol use disorder benefit from intensive combination treatment provided by specialized substance use treatment centers. Both inpatient and outpatient treatment programs are available. Document Released: 02/24/2004 Document Revised: 06/02/2013 Document Reviewed: 04/25/2012 Exit[employer] Patient Information 2015 [employer]. This information is not intended to replace advice given to you by your health care provider. Make sure you discuss any questions you have with your health care provider.  

## 2014-02-06 ENCOUNTER — Emergency Department (HOSPITAL_COMMUNITY)
Admission: EM | Admit: 2014-02-06 | Discharge: 2014-02-06 | Disposition: A | Discharge status: Home / Self Care | Payer: Medicare Other | Attending: Emergency Medicine | Admitting: Emergency Medicine

## 2014-02-06 ENCOUNTER — Encounter (HOSPITAL_COMMUNITY): Payer: Self-pay | Admitting: Emergency Medicine

## 2014-02-06 DIAGNOSIS — Z720 Tobacco use: Secondary | ICD-10-CM | POA: Diagnosis not present

## 2014-02-06 DIAGNOSIS — Z872 Personal history of diseases of the skin and subcutaneous tissue: Secondary | ICD-10-CM | POA: Diagnosis not present

## 2014-02-06 DIAGNOSIS — E669 Obesity, unspecified: Secondary | ICD-10-CM | POA: Insufficient documentation

## 2014-02-06 DIAGNOSIS — Z8619 Personal history of other infectious and parasitic diseases: Secondary | ICD-10-CM | POA: Insufficient documentation

## 2014-02-06 DIAGNOSIS — I129 Hypertensive chronic kidney disease with stage 1 through stage 4 chronic kidney disease, or unspecified chronic kidney disease: Secondary | ICD-10-CM | POA: Diagnosis not present

## 2014-02-06 DIAGNOSIS — Z8719 Personal history of other diseases of the digestive system: Secondary | ICD-10-CM | POA: Diagnosis not present

## 2014-02-06 DIAGNOSIS — Z7982 Long term (current) use of aspirin: Secondary | ICD-10-CM | POA: Insufficient documentation

## 2014-02-06 DIAGNOSIS — Z87828 Personal history of other (healed) physical injury and trauma: Secondary | ICD-10-CM | POA: Diagnosis not present

## 2014-02-06 DIAGNOSIS — M79671 Pain in right foot: Secondary | ICD-10-CM | POA: Diagnosis not present

## 2014-02-06 DIAGNOSIS — E785 Hyperlipidemia, unspecified: Secondary | ICD-10-CM | POA: Insufficient documentation

## 2014-02-06 DIAGNOSIS — N189 Chronic kidney disease, unspecified: Secondary | ICD-10-CM | POA: Insufficient documentation

## 2014-02-06 DIAGNOSIS — I2510 Atherosclerotic heart disease of native coronary artery without angina pectoris: Secondary | ICD-10-CM | POA: Diagnosis not present

## 2014-02-06 DIAGNOSIS — Z79899 Other long term (current) drug therapy: Secondary | ICD-10-CM | POA: Diagnosis not present

## 2014-02-06 MED ORDER — CANE MISC: Status: DC

## 2014-02-06 MED ORDER — PREDNISONE 20 MG PO TABS: 40.0000 mg | ORAL_TABLET | Freq: Every day | ORAL | Status: DC

## 2014-02-06 MED ORDER — OXYCODONE HCL 5 MG PO TABS: 5.0000 mg | ORAL_TABLET | ORAL | Status: DC | PRN

## 2014-02-06 MED ORDER — OXYCODONE HCL 5 MG PO TABS
5.0000 mg | ORAL_TABLET | Freq: Once | ORAL | Status: AC
Start: 2014-02-06 — End: 2014-02-06
  Administered 2014-02-06: 5 mg via ORAL
  Filled 2014-02-06: qty 1

## 2014-02-06 NOTE — Discharge Instructions (Signed)
Take the prescribed medication as directed. °Follow-up with your primary care physician. °Return to the ED for new or worsening symptoms. ° °

## 2014-02-06 NOTE — ED Notes (Signed)
Pt c/o right foot pain x 3 days that is painful to walk on; pt sts just seen here for ETOH detox and wants to go to [facility] but needs to be medically cleared
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
## 2014-03-07 ENCOUNTER — Encounter (HOSPITAL_COMMUNITY): Payer: Self-pay | Admitting: Emergency Medicine

## 2014-03-07 ENCOUNTER — Emergency Department (HOSPITAL_COMMUNITY)
Admission: EM | Admit: 2014-03-07 | Discharge: 2014-03-07 | Disposition: A | Discharge status: Home / Self Care | Payer: Medicare Other | Attending: Emergency Medicine | Admitting: Emergency Medicine

## 2014-03-07 DIAGNOSIS — F1910 Other psychoactive substance abuse, uncomplicated: Secondary | ICD-10-CM

## 2014-03-07 DIAGNOSIS — Z590 Homelessness: Secondary | ICD-10-CM | POA: Insufficient documentation

## 2014-03-07 DIAGNOSIS — I10 Essential (primary) hypertension: Secondary | ICD-10-CM

## 2014-03-07 DIAGNOSIS — F1010 Alcohol abuse, uncomplicated: Secondary | ICD-10-CM | POA: Insufficient documentation

## 2014-03-07 DIAGNOSIS — Z8619 Personal history of other infectious and parasitic diseases: Secondary | ICD-10-CM | POA: Diagnosis not present

## 2014-03-07 DIAGNOSIS — E785 Hyperlipidemia, unspecified: Secondary | ICD-10-CM | POA: Insufficient documentation

## 2014-03-07 DIAGNOSIS — Z79899 Other long term (current) drug therapy: Secondary | ICD-10-CM | POA: Insufficient documentation

## 2014-03-07 DIAGNOSIS — Z87828 Personal history of other (healed) physical injury and trauma: Secondary | ICD-10-CM | POA: Insufficient documentation

## 2014-03-07 DIAGNOSIS — Z720 Tobacco use: Secondary | ICD-10-CM | POA: Diagnosis not present

## 2014-03-07 DIAGNOSIS — E669 Obesity, unspecified: Secondary | ICD-10-CM | POA: Insufficient documentation

## 2014-03-07 DIAGNOSIS — Z8719 Personal history of other diseases of the digestive system: Secondary | ICD-10-CM | POA: Insufficient documentation

## 2014-03-07 DIAGNOSIS — I129 Hypertensive chronic kidney disease with stage 1 through stage 4 chronic kidney disease, or unspecified chronic kidney disease: Secondary | ICD-10-CM | POA: Insufficient documentation

## 2014-03-07 DIAGNOSIS — N189 Chronic kidney disease, unspecified: Secondary | ICD-10-CM | POA: Diagnosis not present

## 2014-03-07 DIAGNOSIS — I2510 Atherosclerotic heart disease of native coronary artery without angina pectoris: Secondary | ICD-10-CM | POA: Diagnosis not present

## 2014-03-07 DIAGNOSIS — F1410 Cocaine abuse, uncomplicated: Secondary | ICD-10-CM | POA: Diagnosis not present

## 2014-03-07 DIAGNOSIS — Z872 Personal history of diseases of the skin and subcutaneous tissue: Secondary | ICD-10-CM | POA: Diagnosis not present

## 2014-03-07 HISTORY — DX: Cocaine abuse, uncomplicated: F14.10

## 2014-03-07 LAB — ACETAMINOPHEN LEVEL

## 2014-03-07 LAB — COMPREHENSIVE METABOLIC PANEL
ALBUMIN: 3.1 g/dL — AB (ref 3.5–5.2)
ALT: 46 U/L (ref 0–53)
AST: 80 U/L — ABNORMAL HIGH (ref 0–37)
Alkaline Phosphatase: 121 U/L — ABNORMAL HIGH (ref 39–117)
Anion gap: 6 (ref 5–15)
BUN: 18 mg/dL (ref 6–23)
CO2: 23 mmol/L (ref 19–32)
Calcium: 9 mg/dL (ref 8.4–10.5)
Chloride: 113 mmol/L — ABNORMAL HIGH (ref 96–112)
Creatinine, Ser: 1.3 mg/dL (ref 0.50–1.35)
GFR calc Af Amer: 64 mL/min — ABNORMAL LOW (ref >90)
GFR calc non Af Amer: 56 mL/min — ABNORMAL LOW (ref >90)
GLUCOSE: 98 mg/dL (ref 70–99)
POTASSIUM: 4.2 mmol/L (ref 3.5–5.1)
SODIUM: 142 mmol/L (ref 135–145)
TOTAL PROTEIN: 7.6 g/dL (ref 6.0–8.3)
Total Bilirubin: 0.5 mg/dL (ref 0.3–1.2)

## 2014-03-07 LAB — RAPID URINE DRUG SCREEN, HOSP PERFORMED
AMPHETAMINES: NOT DETECTED
BARBITURATES: NOT DETECTED
Benzodiazepines: NOT DETECTED
COCAINE: POSITIVE — AB
Opiates: NOT DETECTED
Tetrahydrocannabinol: NOT DETECTED

## 2014-03-07 LAB — CBC
HCT: 38.8 % — ABNORMAL LOW (ref 39.0–52.0)
Hemoglobin: 13.8 g/dL (ref 13.0–17.0)
MCH: 28.5 pg (ref 26.0–34.0)
MCHC: 35.6 g/dL (ref 30.0–36.0)
MCV: 80.2 fL (ref 78.0–100.0)
PLATELETS: 108 10*3/uL — AB (ref 150–400)
RBC: 4.84 MIL/uL (ref 4.22–5.81)
RDW: 14.7 % (ref 11.5–15.5)
WBC: 5.1 10*3/uL (ref 4.0–10.5)

## 2014-03-07 LAB — ETHANOL: Alcohol, Ethyl (B): <5 mg/dL (ref 0–9)

## 2014-03-07 LAB — SALICYLATE LEVEL: Salicylate Lvl: <4 mg/dL (ref 2.8–20.0)

## 2014-03-07 MED ORDER — AMLODIPINE BESYLATE 10 MG PO TABS: 10.0000 mg | ORAL_TABLET | Freq: Every day | ORAL | Status: DC

## 2014-03-07 NOTE — ED Notes (Signed)
Pt in paper scrubs, wanded by security. Belongings secured.

## 2014-03-07 NOTE — ED Notes (Signed)
Dr. Ashok Cordia aware of patients blood pressure.

## 2014-03-07 NOTE — Progress Notes (Signed)
CSW met with with 69 y/o, African-American, male who presents in hospital garb, depressed affect, "not good" was response for mood, normal thought content, clear and coherent, Oriented x3, future planning, denies S/I and H/I.  Patient states he receives $700 approximately in Social Security per month and has already spent his check for the month.  Patient states he went to a liquor house and used cocaine and now has no money.  Patient states he was involved this week with the [facility] program that would house him and pay his rent for 9 months.  Patient is not sure, but thinks he has "blown that opportunity because I went out and did not return this weekend."  Patient states he had almost two years clean up till last year, patient went to [facility] then [facility], then a halfway house.  Patient left to come to [facility] and shortly after relapsed.  He states he was in an assisted living for a short-time, and has been staying at shelters.  Patient states he cannot stay at [facility] shelter or the [facility] as he has stayed there and left, unless it is a white flag night.  CSW gave patient list of residential treatment centers, patient states he will call them from hospital he is interested in [facility].  Patient is primarily concerned with housing.  CSW let patient know he will not meet detox criteria as his alcohol level is 0 and he only has cocaine on his UDS.   Patient asked if he would be medically cleared to go to a residential program, CSW stated he would need to ask the attending and that the discharge paperwork would be what he needed to show he did not need medical detox.  CSW will give patient's nurse a bus pass for patient to be able to travel to [place] and to find out if [facility] program will still accept him (he will not know till [place] ED CSW [phone]
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
## 2014-03-07 NOTE — ED Notes (Signed)
Pt reports here for detox from alcohol. Pt tried calling [facility] but was told to come to ED. Last alcohol use was 0300. Pt drinks E and J and beer. When asked how much pt reports, "A lot" pt admits to cocaine use.

## 2014-03-07 NOTE — Discharge Instructions (Signed)
It was our pleasure to provide your ER care today - we hope that you feel better.  Avoid any alcohol or drug use.  Use resource guide provided for additional community resources.  Follow up with primary care doctor in coming week - see referral.  Also have your blood pressure rechecked then as it is high today.  If mental health issues and/or crisis, go directly to [facility].  If medical emergency, return to ER.      Alcohol Intoxication Alcohol intoxication occurs when the amount of alcohol that a person has consumed impairs his or her ability to mentally and physically function. Alcohol directly impairs the normal chemical activity of the brain. Drinking large amounts of alcohol can lead to changes in mental function and behavior, and it can cause many physical effects that can be harmful.  Alcohol intoxication can range in severity from mild to very severe. Various factors can affect the level of intoxication that occurs, such as the person's age, gender, weight, frequency of alcohol consumption, and the presence of other medical conditions (such as diabetes, seizures, or heart conditions). Dangerous levels of alcohol intoxication may occur when people drink large amounts of alcohol in a short period (binge drinking). Alcohol can also be especially dangerous when combined with certain prescription medicines or "recreational" drugs. SIGNS AND SYMPTOMS Some common signs and symptoms of mild alcohol intoxication include:  Loss of coordination.  Changes in mood and behavior.  Impaired judgment.  Slurred speech. As alcohol intoxication progresses to more severe levels, other signs and symptoms will appear. These may include:  Vomiting.  Confusion and impaired memory.  Slowed breathing.  Seizures.  Loss of consciousness. DIAGNOSIS  Your health care provider will take a medical history and perform a physical exam. You will be asked about the amount and type of alcohol you have  consumed. Blood tests will be done to measure the concentration of alcohol in your blood. In many places, your blood alcohol level must be lower than 80 mg/dL (0.08%) to legally drive. However, many dangerous effects of alcohol can occur at much lower levels.  TREATMENT  People with alcohol intoxication often do not require treatment. Most of the effects of alcohol intoxication are temporary, and they go away as the alcohol naturally leaves the body. Your health care provider will monitor your condition until you are stable enough to go home. Fluids are sometimes given through an IV access tube to help prevent dehydration.  HOME CARE INSTRUCTIONS  Do not drive after drinking alcohol.  Stay hydrated. Drink enough water and fluids to keep your urine clear or pale yellow. Avoid caffeine.   Only take over-the-counter or prescription medicines as directed by your health care provider.  SEEK MEDICAL CARE IF:   You have persistent vomiting.   You do not feel better after a few days.  You have frequent alcohol intoxication. Your health care provider can help determine if you should see a substance use treatment counselor. SEEK IMMEDIATE MEDICAL CARE IF:   You become shaky or tremble when you try to stop drinking.   You shake uncontrollably (seizure).   You throw up (vomit) blood. This may be bright red or may look like black coffee grounds.   You have blood in your stool. This may be bright red or may appear as a black, tarry, bad smelling stool.   You become lightheaded or faint.  MAKE SURE YOU:   Understand these instructions.  Will watch your condition.  Will get help  right away if you are not doing well or get worse. Document Released: 10/26/2004 Document Revised: 09/18/2012 Document Reviewed: 06/21/2012 [facility] Patient Information 2015 [place], [place]. This information is not intended to replace advice given to you by your health care provider. Make sure you discuss any  questions you have with your health care provider.      Alcohol Use Disorder Alcohol use disorder is a mental disorder. It is not a one-time incident of heavy drinking. Alcohol use disorder is the excessive and uncontrollable use of alcohol over time that leads to problems with functioning in one or more areas of daily living. People with this disorder risk harming themselves and others when they drink to excess. Alcohol use disorder also can cause other mental disorders, such as mood and anxiety disorders, and serious physical problems. People with alcohol use disorder often misuse other drugs.  Alcohol use disorder is common and widespread. Some people with this disorder drink alcohol to cope with or escape from negative life events. Others drink to relieve chronic pain or symptoms of mental illness. People with a family history of alcohol use disorder are at higher risk of losing control and using alcohol to excess.  SYMPTOMS  Signs and symptoms of alcohol use disorder may include the following:   Consumption ofalcohol inlarger amounts or over a longer period of time than intended.  Multiple unsuccessful attempts to cutdown or control alcohol use.   A great deal of time spent obtaining alcohol, using alcohol, or recovering from the effects of alcohol (hangover).  A strong desire or urge to use alcohol (cravings).   Continued use of alcohol despite problems at work, school, or home because of alcohol use.   Continued use of alcohol despite problems in relationships because of alcohol use.  Continued use of alcohol in situations when it is physically hazardous, such as driving a car.  Continued use of alcohol despite awareness of a physical or psychological problem that is likely related to alcohol use. Physical problems related to alcohol use can involve the brain, heart, liver, stomach, and intestines. Psychological problems related to alcohol use include intoxication, depression,  anxiety, psychosis, delirium, and dementia.   The need for increased amounts of alcohol to achieve the same desired effect, or a decreased effect from the consumption of the same amount of alcohol (tolerance).  Withdrawal symptoms upon reducing or stopping alcohol use, or alcohol use to reduce or avoid withdrawal symptoms. Withdrawal symptoms include:  Racing heart.  Hand tremor.  Difficulty sleeping.  Nausea.  Vomiting.  Hallucinations.  Restlessness.  Seizures. DIAGNOSIS Alcohol use disorder is diagnosed through an assessment by your health care provider. Your health care provider may start by asking three or four questions to screen for excessive or problematic alcohol use. To confirm a diagnosis of alcohol use disorder, at least two symptoms must be present within a 62-month period. The severity of alcohol use disorder depends on the number of symptoms:  Mild--two or three.  Moderate--four or five.  Severe--six or more. Your health care provider may perform a physical exam or use results from lab tests to see if you have physical problems resulting from alcohol use. Your health care provider may refer you to a mental health professional for evaluation. TREATMENT  Some people with alcohol use disorder are able to reduce their alcohol use to low-risk levels. Some people with alcohol use disorder need to quit drinking alcohol. When necessary, mental health professionals with specialized training in substance use treatment can  help. Your health care provider can help you decide how severe your alcohol use disorder is and what type of treatment you need. The following forms of treatment are available:   Detoxification. Detoxification involves the use of prescription medicines to prevent alcohol withdrawal symptoms in the first week after quitting. This is important for people with a history of symptoms of withdrawal and for heavy drinkers who are likely to have withdrawal symptoms.  Alcohol withdrawal can be dangerous and, in severe cases, cause death. Detoxification is usually provided in a hospital or in-patient substance use treatment facility.  Counseling or talk therapy. Talk therapy is provided by substance use treatment counselors. It addresses the reasons people use alcohol and ways to keep them from drinking again. The goals of talk therapy are to help people with alcohol use disorder find healthy activities and ways to cope with life stress, to identify and avoid triggers for alcohol use, and to handle cravings, which can cause relapse.  Medicines.Different medicines can help treat alcohol use disorder through the following actions:  Decrease alcohol cravings.  Decrease the positive reward response felt from alcohol use.  Produce an uncomfortable physical reaction when alcohol is used (aversion therapy).  Support groups. Support groups are run by people who have quit drinking. They provide emotional support, advice, and guidance. These forms of treatment are often combined. Some people with alcohol use disorder benefit from intensive combination treatment provided by specialized substance use treatment centers. Both inpatient and outpatient treatment programs are available. Document Released: 02/24/2004 Document Revised: 06/02/2013 Document Reviewed: 04/25/2012 [facility] Patient Information 2015 [place], [place]. This information is not intended to replace advice given to you by your health care provider. Make sure you discuss any questions you have with your health care provider.  Alcohol Use Disorder Alcohol use disorder is a mental disorder. It is not a one-time incident of heavy drinking. Alcohol use disorder is the excessive and uncontrollable use of alcohol over time that leads to problems with functioning in one or more areas of daily living. People with this disorder risk harming themselves and others when they drink to excess. Alcohol use disorder also can  cause other mental disorders, such as mood and anxiety disorders, and serious physical problems. People with alcohol use disorder often misuse other drugs.  Alcohol use disorder is common and widespread. Some people with this disorder drink alcohol to cope with or escape from negative life events. Others drink to relieve chronic pain or symptoms of mental illness. People with a family history of alcohol use disorder are at higher risk of losing control and using alcohol to excess.  SYMPTOMS  Signs and symptoms of alcohol use disorder may include the following:   Consumption ofalcohol inlarger amounts or over a longer period of time than intended.  Multiple unsuccessful attempts to cutdown or control alcohol use.   A great deal of time spent obtaining alcohol, using alcohol, or recovering from the effects of alcohol (hangover).  A strong desire or urge to use alcohol (cravings).   Continued use of alcohol despite problems at work, school, or home because of alcohol use.   Continued use of alcohol despite problems in relationships because of alcohol use.  Continued use of alcohol in situations when it is physically hazardous, such as driving a car.  Continued use of alcohol despite awareness of a physical or psychological problem that is likely related to alcohol use. Physical problems related to alcohol use can involve the brain, heart, liver, stomach, and  intestines. Psychological problems related to alcohol use include intoxication, depression, anxiety, psychosis, delirium, and dementia.   The need for increased amounts of alcohol to achieve the same desired effect, or a decreased effect from the consumption of the same amount of alcohol (tolerance).  Withdrawal symptoms upon reducing or stopping alcohol use, or alcohol use to reduce or avoid withdrawal symptoms. Withdrawal symptoms include:  Racing heart.  Hand tremor.  Difficulty  sleeping.  Nausea.  Vomiting.  Hallucinations.  Restlessness.  Seizures. DIAGNOSIS Alcohol use disorder is diagnosed through an assessment by your health care provider. Your health care provider may start by asking three or four questions to screen for excessive or problematic alcohol use. To confirm a diagnosis of alcohol use disorder, at least two symptoms must be present within a 22-month period. The severity of alcohol use disorder depends on the number of symptoms:  Mild--two or three.  Moderate--four or five.  Severe--six or more. Your health care provider may perform a physical exam or use results from lab tests to see if you have physical problems resulting from alcohol use. Your health care provider may refer you to a mental health professional for evaluation. TREATMENT  Some people with alcohol use disorder are able to reduce their alcohol use to low-risk levels. Some people with alcohol use disorder need to quit drinking alcohol. When necessary, mental health professionals with specialized training in substance use treatment can help. Your health care provider can help you decide how severe your alcohol use disorder is and what type of treatment you need. The following forms of treatment are available:   Detoxification. Detoxification involves the use of prescription medicines to prevent alcohol withdrawal symptoms in the first week after quitting. This is important for people with a history of symptoms of withdrawal and for heavy drinkers who are likely to have withdrawal symptoms. Alcohol withdrawal can be dangerous and, in severe cases, cause death. Detoxification is usually provided in a hospital or in-patient substance use treatment facility.  Counseling or talk therapy. Talk therapy is provided by substance use treatment counselors. It addresses the reasons people use alcohol and ways to keep them from drinking again. The goals of talk therapy are to help people with alcohol  use disorder find healthy activities and ways to cope with life stress, to identify and avoid triggers for alcohol use, and to handle cravings, which can cause relapse.  Medicines.Different medicines can help treat alcohol use disorder through the following actions:  Decrease alcohol cravings.  Decrease the positive reward response felt from alcohol use.  Produce an uncomfortable physical reaction when alcohol is used (aversion therapy).  Support groups. Support groups are run by people who have quit drinking. They provide emotional support, advice, and guidance. These forms of treatment are often combined. Some people with alcohol use disorder benefit from intensive combination treatment provided by specialized substance use treatment centers. Both inpatient and outpatient treatment programs are available. Document Released: 02/24/2004 Document Revised: 06/02/2013 Document Reviewed: 04/25/2012 [facility] Patient Information 2015 [place], [place]. This information is not intended to replace advice given to you by your health care provider. Make sure you discuss any questions you have with your health care provider.    Hypertension Hypertension, commonly called high blood pressure, is when the force of blood pumping through your arteries is too strong. Your arteries are the blood vessels that carry blood from your heart throughout your body. A blood pressure reading consists of a higher number over a lower number, such as  110/72. The higher number (systolic) is the pressure inside your arteries when your heart pumps. The lower number (diastolic) is the pressure inside your arteries when your heart relaxes. Ideally you want your blood pressure below 120/80. Hypertension forces your heart to work harder to pump blood. Your arteries may become narrow or stiff. Having hypertension puts you at risk for heart disease, stroke, and other problems.  RISK FACTORS Some risk factors for high blood pressure are  controllable. Others are not.  Risk factors you cannot control include:   Race. You may be at higher risk if you are African American.  Age. Risk increases with age.  Gender. Men are at higher risk than women before age 35 years. After age 58, women are at higher risk than men. Risk factors you can control include:  Not getting enough exercise or physical activity.  Being overweight.  Getting too much fat, sugar, calories, or salt in your diet.  Drinking too much alcohol. SIGNS AND SYMPTOMS Hypertension does not usually cause signs or symptoms. Extremely high blood pressure (hypertensive crisis) may cause headache, anxiety, shortness of breath, and nosebleed. DIAGNOSIS  To check if you have hypertension, your health care provider will measure your blood pressure while you are seated, with your arm held at the level of your heart. It should be measured at least twice using the same arm. Certain conditions can cause a difference in blood pressure between your right and left arms. A blood pressure reading that is higher than normal on one occasion does not mean that you need treatment. If one blood pressure reading is high, ask your health care provider about having it checked again. TREATMENT  Treating high blood pressure includes making lifestyle changes and possibly taking medicine. Living a healthy lifestyle can help lower high blood pressure. You may need to change some of your habits. Lifestyle changes may include:  Following the DASH diet. This diet is high in fruits, vegetables, and whole grains. It is low in salt, red meat, and added sugars.  Getting at least 2 hours of brisk physical activity every week.  Losing weight if necessary.  Not smoking.  Limiting alcoholic beverages.  Learning ways to reduce stress. If lifestyle changes are not enough to get your blood pressure under control, your health care provider may prescribe medicine. You may need to take more than one.  Work closely with your health care provider to understand the risks and benefits. HOME CARE INSTRUCTIONS  Have your blood pressure rechecked as directed by your health care provider.   Take medicines only as directed by your health care provider. Follow the directions carefully. Blood pressure medicines must be taken as prescribed. The medicine does not work as well when you skip doses. Skipping doses also puts you at risk for problems.   Do not smoke.   Monitor your blood pressure at home as directed by your health care provider. SEEK MEDICAL CARE IF:   You think you are having a reaction to medicines taken.  You have recurrent headaches or feel dizzy.  You have swelling in your ankles.  You have trouble with your vision. SEEK IMMEDIATE MEDICAL CARE IF:  You develop a severe headache or confusion.  You have unusual weakness, numbness, or feel faint.  You have severe chest or abdominal pain.  You vomit repeatedly.  You have trouble breathing. MAKE SURE YOU:   Understand these instructions.  Will watch your condition.  Will get help right away if you are not  doing well or get worse. Document Released: 01/16/2005 Document Revised: 06/02/2013 Document Reviewed: 11/08/2012 [facility] Patient Information 2015 [place], [place]. This information is not intended to replace advice given to you by your health care provider. Make sure you discuss any questions you have with your health care provider.    Stimulant Use Disorder-Cocaine Cocaine is one of a group of powerful drugs called stimulants. Cocaine has medical uses for stopping nosebleeds and for pain control before minor nose or dental surgery. However, cocaine is misused because of the effects that it produces. These effects include:   A feeling of extreme pleasure.  Alertness.  High energy. Common street names for cocaine include coke, crack, blow, snow, and nose candy. Cocaine is snorted, dissolved in water and  injected, or smoked.  Stimulants are addictive because they activate regions of the brain that produce both the pleasurable sensation of "reward" and psychological dependence. Together, these actions account for loss of control and the rapid development of drug dependence. This means you become ill without the drug (withdrawal) and need to keep using it to function.  Stimulant use disorder is use of stimulants that disrupts your daily life. It disrupts relationships with family and friends and how you do your job. Cocaine increases your blood pressure and heart rate. It can cause a heart attack or stroke. Cocaine can also cause death from irregular heart rate or seizures. SYMPTOMS Symptoms of stimulant use disorder with cocaine include:  Use of cocaine in larger amounts or over a longer period of time than intended.  Unsuccessful attempts to cut down or control cocaine use.  A lot of time spent obtaining, using, or recovering from the effects of cocaine.  A strong desire or urge to use cocaine (craving).  Continued use of cocaine in spite of major problems at work, school, or home because of use.  Continued use of cocaine in spite of relationship problems because of use.  Giving up or cutting down on important life activities because of cocaine use.  Use of cocaine over and over in situations when it is physically hazardous, such as driving a car.  Continued use of cocaine in spite of a physical problem that is likely related to use. Physical problems can include:  Malnutrition.  Nosebleeds.  Chest pain.  High blood pressure.  A hole that develops between the part of your nose that separates your nostrils (perforated nasal septum).  Lung and kidney damage.  Continued use of cocaine in spite of a mental problem that is likely related to use. Mental problems can include:  Schizophrenia-like symptoms.  Depression.  Bipolar mood swings.  Anxiety.  Sleep problems.  Need to  use more and more cocaine to get the same effect, or lessened effect over time with use of the same amount of cocaine (tolerance).  Having withdrawal symptoms when cocaine use is stopped, or using cocaine to reduce or avoid withdrawal symptoms. Withdrawal symptoms include:  Depressed or irritable mood.  Low energy or restlessness.  Bad dreams.  Poor or excessive sleep.  Increased appetite. DIAGNOSIS Stimulant use disorder is diagnosed by your health care provider. You may be asked questions about your cocaine use and how it affects your life. A physical exam may be done. A drug screen may be ordered. You may be referred to a mental health professional. The diagnosis of stimulant use disorder requires at least two symptoms within 12 months. The type of stimulant use disorder depends on the number of signs and symptoms you  have. The type may be:  Mild. Two or three signs and symptoms.  Moderate. Four or five signs and symptoms.  Severe. Six or more signs and symptoms. TREATMENT Treatment for stimulant use disorder is usually provided by mental health professionals with training in substance use disorders. The following options are available:  Counseling or talk therapy. Talk therapy addresses the reasons you use cocaine and ways to keep you from using again. Goals of talk therapy include:  Identifying and avoiding triggers for use.  Handling cravings.  Replacing use with healthy activities.  Support groups. Support groups provide emotional support, advice, and guidance.  Medicine. Certain medicines may decrease cocaine cravings or withdrawal symptoms. HOME CARE INSTRUCTIONS  Take medicines only as directed by your health care provider.  Identify the people and activities that trigger your cocaine use and avoid them.  Keep all follow-up visits as directed by your health care provider. SEEK MEDICAL CARE IF:  Your symptoms get worse or you relapse.  You are not able to take  medicines as directed. SEEK IMMEDIATE MEDICAL CARE IF:  You have serious thoughts about hurting yourself or others.  You have a seizure, chest pain, sudden weakness, or loss of speech or vision. [place] [employer]: motorcyclefax.[email or web address]  Substance Abuse and Mental Health Services Administration: [email or web address] Document Released: 01/14/2000 Document Revised: 06/02/2013 Document Reviewed: 01/29/2013 [facility] Patient Information 2015 [place], [place]. This information is not intended to replace advice given to you by your health care provider. Make sure you discuss any questions you have with your health care provider.      Emergency Department Resource Guide 1) Find a Doctor and Pay Out of Pocket Although you won't have to find out who is covered by your insurance plan, it is a good idea to ask around and get recommendations. You will then need to call the office and see if the doctor you have chosen will accept you as a new patient and what types of options they offer for patients who are self-pay. Some doctors offer discounts or will set up payment plans for their patients who do not have insurance, but you will need to ask so you aren't surprised when you get to your appointment.  2) Contact Your Local Health Department Not all health departments have doctors that can see patients for sick visits, but many do, so it is worth a call to see if yours does. If you don't know where your local health department is, you can check in your phone book. The CDC also has a tool to help you locate your state's health department, and many state websites also have listings of all of their local health departments.  3) Find a Story Clinic If your illness is not likely to be very severe or complicated, you may want to try a walk in clinic. These are popping up all over the country in pharmacies, drugstores, and shopping centers. They're usually staffed by nurse  practitioners or physician assistants that have been trained to treat common illnesses and complaints. They're usually fairly quick and inexpensive. However, if you have serious medical issues or chronic medical problems, these are probably not your best option.  No Primary Care Doctor: - Call Health Connect at  [phone] - they can help you locate a primary care doctor that  accepts your insurance, provides certain services, etc. - Physician Referral Service- [phone]  Chronic Pain Problems: Organization         Address  Phone   Notes  [facility]  [phone] Patients need to be referred by their primary care doctor.   Medication Assistance: Organization         Address  Phone   Notes  [facility] Medication [facility] [place], [place] [address] [phone] --Must be a resident of [facility] -- Must have NO insurance coverage whatsoever (no Medicaid/ Medicare, etc.) -- The pt. MUST have a primary care doctor that directs their care regularly and follows them in the community   [facility]  [phone]   [employer]  [phone]    Agencies that provide inexpensive medical care: Organization         Address  Phone   Notes  [facility]  [phone]   [facility]    [phone]   [facility] [place], [place] [address] [phone]   [place] [address], [place] [phone]   Planned Parenthood    [phone]   [facility]    [phone]   [place] and [place] [address], [place] Phone:  [phone], Fax:  [phone] Hours of Operation:  9 am - 6 pm, M-F.  Also accepts Medicaid/Medicare and self-pay.  [facility] for [place], [address], [place] Phone: [phone], Fax: [phone]. Hours of Operation:  8:30 am -  5:30 pm, M-F.  Also accepts Medicaid and self-pay.  [facility] [place] [address] [place] Phone: [phone]   [place], [place], [place] [phone], Ext. [phone] Mondays & Thursdays: 7-9 AM.  First 15 patients are seen on a first come, first serve basis.    [place] Providers:  Organization         Address  Phone   Notes  [facility] [address], [place] [phone] Also accepts self-pay patients.  [facility] [id number] [place], [place]  [phone]   [place], [address], [place] [phone]   [facility] [address] [place] [phone]   Lucianne Lei [address], [place]   [phone] Only accepts [place] Access [place] patients after they have their name applied to their card.   Self-Pay (no insurance) in [facility]:  Organization         Address  Phone   Notes  Sickle Cell Patients, [facility] [place] [phone]   [facility] [place] [phone]   [facility] [place]  [place], [place], [place] [phone]   [facility]/Dr. Osei-Bonsu  [address], [place] or [address], [place] [phone] Phone number for both [place] and [place] locations is the same.  [facility] [address], [place] [phone]   [facility] [address] [place] or [address] [phone]   [facility] [address], [place] [phone], phone; [phone], fax Sees patients 1st and 3rd Saturday of every month.  Must not qualify for public or  private insurance (i.e. Medicaid, Medicare, [place] Health Choice,  Veterans' Benefits)  Household income should be no more than 200% of the poverty level The clinic cannot treat you if you are pregnant or think you are pregnant  Sexually transmitted diseases are not treated at the clinic.    Dental Care: Organization         Address  Phone  Notes  [facility] Department [facility] [place] [phone] Accepts children up to age 71 who are enrolled in [place] or [place]; pregnant women with a Medicaid card; and children who have applied for Medicaid or [place] Health Choice, but were declined, whose parents can pay a reduced fee at time of service.  [facility] Department of [facility]  [address], [place] [phone] Accepts children up to age 67 who are enrolled in [place] or [place]; pregnant women with a Medicaid card; and children who have applied for Medicaid or [place] Health Choice, but were declined, whose parents can pay a reduced fee at time of service.  [facility] PRO[facility]  [place] [phone] Patients are seen by appointment only. Walk-ins are not accepted. [place] will see patients 4 years of age and older. Monday - Tuesday (8am-5pm) Most Wednesdays (8:30-5pm) $30 per visit, cash only  [facility] Adult Dental Access PROGRAM  [address], [facility] [phone] Patients are seen by appointment only. Walk-ins are not accepted. [place] will see patients 61 years of age and older. One Wednesday Evening (Monthly: Volunteer Based).  $30 per visit, cash only  Reserve  [phone] for adults; Children under age 72, call Graduate Pediatric Dentistry at [phone]. Children aged 53-14, please call [phone] to request a pediatric application.  Dental services are provided in all areas of dental care including fillings, crowns and bridges, complete and  partial dentures, implants, gum treatment, root canals, and extractions. Preventive care is also provided. Treatment is provided to both adults and children. Patients are selected via a lottery and there is often a waiting list.   [facility] [address], [place]  [phone] [email or web address]   [facility] [address] [place], [place] [phone], Ext. [phone] Second and Fourth Thursday of each month, opens at 6:30 AM; Clinic ends at 9 AM.  Patients are seen on a first-come first-served basis, and a limited number are seen during each clinic.   [facility]  [address] Hillard Danker [place], [place] [phone]   Eligibility Requirements You must have lived in [place], [place], or [place] counties for at least the last three months.   You cannot be eligible for state or federal sponsored [employer], including [employer], [place], or [employer].   You generally cannot be eligible for healthcare insurance through your employer.    How to apply: Eligibility screenings are held every Tuesday and Wednesday afternoon from 1:00 pm until 4:00 pm. You do not need an appointment for the interview!  [facility] [address], [place], [place]   [place]  [place] Department  [place]  [phone]    Behavioral Health Resources in the Community: Intensive Outpatient Programs Organization         Address  Phone  Notes  [place] [address] [place], [place] [phone]   [facility] Outpatient [address], [place], [place]   ADS: Alcohol & Drug Svcs [address] [place], [place]   [place] [address]  [place], [place] or [phone]   Substance Abuse Resources Organization          Address  Phone  Notes  Al[facility] Drug Services  [phone]   [place]  [phone]   [facility] [place]   Chinita Pester  [phone]   Residential & Outpatient Substance Abuse Program  [phone]   Psychological Services Organization         Address  Phone  Notes  [facility] [place]  [place]  [phone]   [place] [address], [place] or [phone]    Mobile Crisis Teams Organization         Address  Phone  Notes  [facility], Mobile Crisis Care Unit  [phone]   [facility]  [address] [place], [place]   [place] Levels [address], [place] [phone]    Self-Help/Support Groups Organization         Address  Phone             Notes  [place]. of [place] - variety of support groups  [place] Call for more information  Narcotics Anonymous (NA), [facility] [address], [employer] [place]  2 meetings at this location   Special educational needs teacher         Address  Phone  Notes  [facility]idential [facility] [place],    [place]  [phone]   [facility]  [address] [place] [id number], [place], [place]   [place], [place] [phone] Admissions: 8am-3pm M-F  Incentives Substance [place] [address]    [place], [place] [id number]   [facility] [address] [place], [place], [place]   [facility] [address]  [place], [place]   [facility] - Intensive Outpatient [address], [place], [place]   [facility] ([place].,  [place], [place] [phone] or [phone]   [facility] Services (RTS) [address] [place], [place] Accepts Medicaid  [facility] [place] [address],  [place] [phone] Substance Abuse/Addiction Treatment   [facility] Organization         Address  Phone  Notes  [facility]  [phone]   Domenic Schwab, PhD [address] Arlis Porta [place], [place]   [phone] or [phone]   [facility] [place], [place] [phone]   [facility] [address] [place], [place] [phone] Insurance/Medicaid/sponsorship through [facility] and Families [address]                                    [place], [place] [phone] [place] [address], [place] [phone]    Dr. Adele Schilder  [phone]   [facility]  Health Dept. 1) [address] [place] 2) [place] 3)  [place] [address], [place] [phone]  [phone]   [place] [phone] or [phone] (After Hours)

## 2014-03-31 ENCOUNTER — Emergency Department (HOSPITAL_COMMUNITY): Payer: Medicare Other

## 2014-03-31 ENCOUNTER — Encounter (HOSPITAL_COMMUNITY): Payer: Self-pay | Admitting: *Deleted

## 2014-03-31 ENCOUNTER — Emergency Department (HOSPITAL_COMMUNITY)
Admission: EM | Admit: 2014-03-31 | Discharge: 2014-03-31 | Disposition: A | Discharge status: Home / Self Care | Payer: Medicare Other | Attending: Emergency Medicine | Admitting: Emergency Medicine

## 2014-03-31 DIAGNOSIS — Z791 Long term (current) use of non-steroidal anti-inflammatories (NSAID): Secondary | ICD-10-CM | POA: Diagnosis not present

## 2014-03-31 DIAGNOSIS — Z8719 Personal history of other diseases of the digestive system: Secondary | ICD-10-CM | POA: Insufficient documentation

## 2014-03-31 DIAGNOSIS — I2510 Atherosclerotic heart disease of native coronary artery without angina pectoris: Secondary | ICD-10-CM | POA: Insufficient documentation

## 2014-03-31 DIAGNOSIS — Z720 Tobacco use: Secondary | ICD-10-CM | POA: Diagnosis not present

## 2014-03-31 DIAGNOSIS — E785 Hyperlipidemia, unspecified: Secondary | ICD-10-CM | POA: Diagnosis not present

## 2014-03-31 DIAGNOSIS — Z7982 Long term (current) use of aspirin: Secondary | ICD-10-CM | POA: Diagnosis not present

## 2014-03-31 DIAGNOSIS — I129 Hypertensive chronic kidney disease with stage 1 through stage 4 chronic kidney disease, or unspecified chronic kidney disease: Secondary | ICD-10-CM | POA: Insufficient documentation

## 2014-03-31 DIAGNOSIS — Z87828 Personal history of other (healed) physical injury and trauma: Secondary | ICD-10-CM | POA: Insufficient documentation

## 2014-03-31 DIAGNOSIS — Z7951 Long term (current) use of inhaled steroids: Secondary | ICD-10-CM | POA: Diagnosis not present

## 2014-03-31 DIAGNOSIS — N189 Chronic kidney disease, unspecified: Secondary | ICD-10-CM | POA: Diagnosis not present

## 2014-03-31 DIAGNOSIS — M79642 Pain in left hand: Secondary | ICD-10-CM | POA: Insufficient documentation

## 2014-03-31 DIAGNOSIS — Z79899 Other long term (current) drug therapy: Secondary | ICD-10-CM | POA: Insufficient documentation

## 2014-03-31 DIAGNOSIS — Z872 Personal history of diseases of the skin and subcutaneous tissue: Secondary | ICD-10-CM | POA: Insufficient documentation

## 2014-03-31 DIAGNOSIS — Z8619 Personal history of other infectious and parasitic diseases: Secondary | ICD-10-CM | POA: Diagnosis not present

## 2014-03-31 DIAGNOSIS — R202 Paresthesia of skin: Secondary | ICD-10-CM | POA: Insufficient documentation

## 2014-03-31 DIAGNOSIS — E669 Obesity, unspecified: Secondary | ICD-10-CM | POA: Diagnosis not present

## 2014-03-31 LAB — URINALYSIS, ROUTINE W REFLEX MICROSCOPIC
BILIRUBIN URINE: NEGATIVE
GLUCOSE, UA: NEGATIVE mg/dL
HGB URINE DIPSTICK: NEGATIVE
Ketones, ur: NEGATIVE mg/dL
Leukocytes, UA: NEGATIVE
Nitrite: NEGATIVE
Protein, ur: 100 mg/dL — AB
Specific Gravity, Urine: 1.018 (ref 1.005–1.030)
Urobilinogen, UA: 4 mg/dL — ABNORMAL HIGH (ref 0.0–1.0)
pH: 6 (ref 5.0–8.0)

## 2014-03-31 LAB — COMPREHENSIVE METABOLIC PANEL
ALT: 39 U/L (ref 0–53)
AST: 69 U/L — ABNORMAL HIGH (ref 0–37)
Albumin: 2.8 g/dL — ABNORMAL LOW (ref 3.5–5.2)
Alkaline Phosphatase: 125 U/L — ABNORMAL HIGH (ref 39–117)
Anion gap: 5 (ref 5–15)
BUN: 23 mg/dL (ref 6–23)
CO2: 24 mmol/L (ref 19–32)
Calcium: 8.5 mg/dL (ref 8.4–10.5)
Chloride: 113 mmol/L — ABNORMAL HIGH (ref 96–112)
Creatinine, Ser: 1.73 mg/dL — ABNORMAL HIGH (ref 0.50–1.35)
GFR calc Af Amer: 46 mL/min — ABNORMAL LOW (ref >90)
GFR calc non Af Amer: 39 mL/min — ABNORMAL LOW (ref >90)
Glucose, Bld: 96 mg/dL (ref 70–99)
Potassium: 4.2 mmol/L (ref 3.5–5.1)
Sodium: 142 mmol/L (ref 135–145)
Total Bilirubin: 1 mg/dL (ref 0.3–1.2)
Total Protein: 7.2 g/dL (ref 6.0–8.3)

## 2014-03-31 LAB — URINE MICROSCOPIC-ADD ON

## 2014-03-31 LAB — CBC
HCT: 35.3 % — ABNORMAL LOW (ref 39.0–52.0)
Hemoglobin: 12.1 g/dL — ABNORMAL LOW (ref 13.0–17.0)
MCH: 28.1 pg (ref 26.0–34.0)
MCHC: 34.3 g/dL (ref 30.0–36.0)
MCV: 81.9 fL (ref 78.0–100.0)
Platelets: 107 10*3/uL — ABNORMAL LOW (ref 150–400)
RBC: 4.31 MIL/uL (ref 4.22–5.81)
RDW: 14.4 % (ref 11.5–15.5)
WBC: 6.8 10*3/uL (ref 4.0–10.5)

## 2014-03-31 LAB — SEDIMENTATION RATE: SED RATE: 17 mm/h — AB (ref 0–16)

## 2014-03-31 MED ORDER — PREDNISONE 20 MG PO TABS: 40.0000 mg | ORAL_TABLET | Freq: Every day | ORAL | Status: DC

## 2014-03-31 MED ORDER — OXYCODONE HCL 5 MG PO TABS: 5.0000 mg | ORAL_TABLET | ORAL | Status: DC | PRN

## 2014-03-31 MED ORDER — HYDROCODONE-ACETAMINOPHEN 5-325 MG PO TABS
1.0000 | ORAL_TABLET | Freq: Once | ORAL | Status: AC
  Administered 2014-03-31: 1 via ORAL
  Filled 2014-03-31: qty 1

## 2014-03-31 MED ORDER — DEXAMETHASONE SODIUM PHOSPHATE 10 MG/ML IJ SOLN
10.0000 mg | Freq: Once | INTRAMUSCULAR | Status: AC
  Administered 2014-03-31: 10 mg via INTRAMUSCULAR
  Filled 2014-03-31: qty 1

## 2014-03-31 MED ORDER — CEPHALEXIN 500 MG PO CAPS: 500.0000 mg | ORAL_CAPSULE | Freq: Four times a day (QID) | ORAL | Status: DC

## 2014-03-31 NOTE — ED Provider Notes (Deleted)
MSE was initiated and I personally evaluated the patient and placed orders (if any) at  9:14 AM on March 31, 2014.  The patient appears stable so that the remainder of the MSE may be completed by another provider.  Bruce Hayes This is a very pleasant 69 year old male with a past medical history of hepatitis C, chronic alcohol abuse, cocaine abuse, hypertension, recent diagnosis of gout. He is a chronic daily smoker. The patient also has a history of hep C, cirrhosis, CAD and chronic kidney disease. The patient was recently diagnosed with gout of the right ankle. It was his first diagnosis. He should definitely admits that he drinks daily. States that his last cocaine use was 2 weeks ago. The patient states that 4 days ago he awoke from sleep with pain and paresthesia in the left hand. His hand became swollen and and reddened. He states that he has lost grip strength and has had continued pain in the hand since that time. The patient states it does not feel like his previous gout attack. He states that that was much more painful. The patient indicates his hand pain to that of paresthesia after sleeping on the arm all night. He states that normally when he awakens, it goes away. However, it has continued. He is dropping glasses. He denies any other focal neurologic deficits. He states he has had chills and a subjective fever.  Objective: Patient is well-developed, well-nourished male in no acute distress. Heart and lungs sounds normal. No abdominal tenderness. The left hand has swelling and heat over the second and third dorsum of the metacarpophalangeal joints. He has pain with any range of motion. Tender to palpation exquisitely at the third MCP. He is unable to make a fist. There is redness and heat. He has full strength with flexion and extension at the elbow, flexion, extension, abduction and abduction at the shoulder, palpable radial pulse.   Patient with multiple risk factors, hypertension,  question possible septic joint. I didn't doubt central cause of his paresthesia. Move to a higher acuity. I have ordered labs, sedimentation rate and x-ray.   Margarita Mail, PA-C 03/31/14 0930

## 2014-03-31 NOTE — Discharge Instructions (Signed)
Gout is an inflammatory arthritis caused by a buildup of uric acid crystals in the joints. Uric acid is a chemical that is normally present in the blood. When the level of uric acid in the blood is too high it can form crystals that deposit in your joints and tissues. This causes joint redness, soreness, and swelling (inflammation). Repeat attacks are common. Over time, uric acid crystals can form into masses (tophi) near a joint, destroying bone and causing disfigurement. Gout is treatable and often preventable. CAUSES  The disease begins with elevated levels of uric acid in the blood. Uric acid is produced by your body when it breaks down a naturally found substance called purines. Certain foods you eat, such as meats and fish, contain high amounts of purines. Causes of an elevated uric acid level include:  Being passed down from parent to child (heredity).  Diseases that cause increased uric acid production (such as obesity, psoriasis, and certain cancers).  Excessive alcohol use.  Diet, especially diets rich in meat and seafood.  Medicines, including certain cancer-fighting medicines (chemotherapy), water pills (diuretics), and aspirin.  Chronic kidney disease. The kidneys are no longer able to remove uric acid well.  Problems with metabolism. Conditions strongly associated with gout include:  Obesity.  High blood pressure.  High cholesterol.  Diabetes. Not everyone with elevated uric acid levels gets gout. It is not understood why some people get gout and others do not. Surgery, joint injury, and eating too much of certain foods are some of the factors that can lead to gout attacks. SYMPTOMS   An attack of gout comes on quickly. It causes intense pain with redness, swelling, and warmth in a joint.  Fever can occur.  Often, only one joint is involved. Certain joints are more commonly involved:  Base of the big toe.  Knee.  Ankle.  Wrist.  Finger. Without treatment, an  attack usually goes away in a few days to weeks. Between attacks, you usually will not have symptoms, which is different from many other forms of arthritis. DIAGNOSIS  Your caregiver will suspect gout based on your symptoms and exam. In some cases, tests may be recommended. The tests may include:  Blood tests.  Urine tests.  X-rays.  Joint fluid exam. This exam requires a needle to remove fluid from the joint (arthrocentesis). Using a microscope, gout is confirmed when uric acid crystals are seen in the joint fluid. TREATMENT  There are two phases to gout treatment: treating the sudden onset (acute) attack and preventing attacks (prophylaxis).  Treatment of an Acute Attack.  Medicines are used. These include anti-inflammatory medicines or steroid medicines.  An injection of steroid medicine into the affected joint is sometimes necessary.  The painful joint is rested. Movement can worsen the arthritis.  You may use warm or cold treatments on painful joints, depending which works best for you.  Treatment to Prevent Attacks.  If you suffer from frequent gout attacks, your caregiver may advise preventive medicine. These medicines are started after the acute attack subsides. These medicines either help your kidneys eliminate uric acid from your body or decrease your uric acid production. You may need to stay on these medicines for a very long time.  The early phase of treatment with preventive medicine can be associated with an increase in acute gout attacks. For this reason, during the first few months of treatment, your caregiver may also advise you to take medicines usually used for acute gout treatment. Be sure you understand  your caregiver's directions. Your caregiver may make several adjustments to your medicine dose before these medicines are effective.  Discuss dietary treatment with your caregiver or dietitian. Alcohol and drinks high in sugar and fructose and foods such as meat,  poultry, and seafood can increase uric acid levels. Your caregiver or dietitian can advise you on drinks and foods that should be limited. HOME CARE INSTRUCTIONS   Do not take aspirin to relieve pain. This raises uric acid levels.  Only take over-the-counter or prescription medicines for pain, discomfort, or fever as directed by your caregiver.  Rest the joint as much as possible. When in bed, keep sheets and blankets off painful areas.  Keep the affected joint raised (elevated).  Apply warm or cold treatments to painful joints. Use of warm or cold treatments depends on which works best for you.  Use crutches if the painful joint is in your leg.  Drink enough fluids to keep your urine clear or pale yellow. This helps your body get rid of uric acid. Limit alcohol, sugary drinks, and fructose drinks.  Follow your dietary instructions. Pay careful attention to the amount of protein you eat. Your daily diet should emphasize fruits, vegetables, whole grains, and fat-free or low-fat milk products. Discuss the use of coffee, vitamin C, and cherries with your caregiver or dietitian. These may be helpful in lowering uric acid levels.  Maintain a healthy body weight. SEEK MEDICAL CARE IF:   You develop diarrhea, vomiting, or any side effects from medicines.  You do not feel better in 24 hours, or you are getting worse. SEEK IMMEDIATE MEDICAL CARE IF:   Your joint becomes suddenly more tender, and you have chills or a fever. MAKE SURE YOU:   Understand these instructions.  Will watch your condition.  Will get help right away if you are not doing well or get worse. Document Released: 01/14/2000 Document Revised: 06/02/2013 Document Reviewed: 08/30/2011 [facility] Patient Information 2015 [place], [place]. This information is not intended to replace advice given to you by your health care provider. Make sure you discuss any questions you have with your health care provider.   Infectious Finger  Tenosynovitis Infection of the finger tendon sheath generally occurs because of a puncture wound which introduces bacteria into the tendon sheath. The tendons bend the fingers. The tendons are housed in tunnels called sheaths. These sheaths keep the tendons aligned with the finger joints.  CAUSES  When a puncture wound introduces bacteria, the closed space around the tendons can become the site of infection. Wounds are often the result of:  Sharp objects such as a nail.  An animal bite.  A human bite. SYMPTOMS  The body sends white blood cells into the area to kill the bacteria. This process will produce inflammatory chemicals which cause:  Swelling.  Decreased motion.  Severe pain with any motion of the finger. Following the puncture wound the finger becomes increasingly swollen and warm. Pain with any motion soon follows. Red streaks may form on the skin which can move up into the forearm. Fever and chills usually result. This is an emergency and medical attention should be sought urgently. DIAGNOSIS  Your caregiver will easily make this diagnosis on examination. TREATMENT   A splint may be used on a short term basis. Follow your caregiver's instructions on when and where to remove the splint for exercising the finger.  The finger is usually splinted for a brief period of a few days while antibiotic therapy is initiated. Once  the swelling, redness and warmth is improved, motion of the finger is begun.  If the diagnosis is not clear your caregiver may prescribe antibiotics and ask you to follow-up within 24 to 48 hours for a repeat examination.  Your caregiver may insert a needle in the tendon sheath (aspirate) to get a culture for the purpose of directing antibiotic therapy.  Only take over-the-counter or prescription medicines for pain, discomfort, or fever as directed by your caregiver.  Surgery is another treatment that is frequently used if the diagnosis is clear. Small  incisions are made along the palm and in the finger. A catheter is inserted to irrigate the tendon sheath after a culture is obtained.  Occupational or hand therapy is often required to eliminate stiffness remaining in the finger. COMPLICATIONS Complications may occur. They include:  Recurrence of the infection. This does not mean that the surgery was not well done. It means that the bacteria were not completely eliminated by the surgery and antibiotics. Repeat irrigation in the operating room may be required.  Permanent stiffness of the finger can result if you do not follow through with instructions on exercises or fail to report for your occupational or hand therapy appointments. SEEK IMMEDIATE MEDICAL CARE IF:  If the swelling, redness, warmth and pain with motion recur, especially if accompanied by a fever of greater than 101 F (38.3 C). Document Released: 04/14/2008 Document Revised: 01/21/2013 Document Reviewed: 04/14/2008 [facility] Patient Information 2015 [place], [place]. This information is not intended to replace advice given to you by your health care provider. Make sure you discuss any questions you have with your health care provider. Gout

## 2014-03-31 NOTE — ED Notes (Signed)
Declined W/C at D/C and was escorted to lobby by RN. 
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
## 2014-03-31 NOTE — ED Notes (Signed)
Patient with onset of swelling and pain in the left hand/knuckles.  Patient denies trauma.  He has hx of gout in ankle.  Patient reports he has not taken any meds for pain today.

## 2014-04-27 ENCOUNTER — Emergency Department (HOSPITAL_COMMUNITY)
Admission: EM | Admit: 2014-04-27 | Discharge: 2014-04-27 | Disposition: A | Discharge status: Home / Self Care | Payer: Medicare Other | Attending: Emergency Medicine | Admitting: Emergency Medicine

## 2014-04-27 ENCOUNTER — Encounter (HOSPITAL_COMMUNITY): Payer: Self-pay | Admitting: Neurology

## 2014-04-27 DIAGNOSIS — Z872 Personal history of diseases of the skin and subcutaneous tissue: Secondary | ICD-10-CM | POA: Insufficient documentation

## 2014-04-27 DIAGNOSIS — Z792 Long term (current) use of antibiotics: Secondary | ICD-10-CM | POA: Insufficient documentation

## 2014-04-27 DIAGNOSIS — Z87828 Personal history of other (healed) physical injury and trauma: Secondary | ICD-10-CM | POA: Insufficient documentation

## 2014-04-27 DIAGNOSIS — Z720 Tobacco use: Secondary | ICD-10-CM | POA: Insufficient documentation

## 2014-04-27 DIAGNOSIS — Z7952 Long term (current) use of systemic steroids: Secondary | ICD-10-CM | POA: Insufficient documentation

## 2014-04-27 DIAGNOSIS — I2510 Atherosclerotic heart disease of native coronary artery without angina pectoris: Secondary | ICD-10-CM | POA: Diagnosis not present

## 2014-04-27 DIAGNOSIS — Z79899 Other long term (current) drug therapy: Secondary | ICD-10-CM | POA: Diagnosis not present

## 2014-04-27 DIAGNOSIS — M109 Gout, unspecified: Secondary | ICD-10-CM | POA: Diagnosis not present

## 2014-04-27 DIAGNOSIS — E669 Obesity, unspecified: Secondary | ICD-10-CM | POA: Diagnosis not present

## 2014-04-27 DIAGNOSIS — I129 Hypertensive chronic kidney disease with stage 1 through stage 4 chronic kidney disease, or unspecified chronic kidney disease: Secondary | ICD-10-CM | POA: Diagnosis not present

## 2014-04-27 DIAGNOSIS — E785 Hyperlipidemia, unspecified: Secondary | ICD-10-CM | POA: Insufficient documentation

## 2014-04-27 DIAGNOSIS — Z7982 Long term (current) use of aspirin: Secondary | ICD-10-CM | POA: Diagnosis not present

## 2014-04-27 DIAGNOSIS — Z8719 Personal history of other diseases of the digestive system: Secondary | ICD-10-CM | POA: Diagnosis not present

## 2014-04-27 DIAGNOSIS — Z8619 Personal history of other infectious and parasitic diseases: Secondary | ICD-10-CM | POA: Diagnosis not present

## 2014-04-27 DIAGNOSIS — M25562 Pain in left knee: Secondary | ICD-10-CM | POA: Insufficient documentation

## 2014-04-27 DIAGNOSIS — N189 Chronic kidney disease, unspecified: Secondary | ICD-10-CM | POA: Insufficient documentation

## 2014-04-27 DIAGNOSIS — M79602 Pain in left arm: Secondary | ICD-10-CM | POA: Diagnosis present

## 2014-04-27 LAB — CBC WITH DIFFERENTIAL/PLATELET
BASOS ABS: 0 10*3/uL (ref 0.0–0.1)
BASOS PCT: 0 % (ref 0–1)
Eosinophils Absolute: 0.2 10*3/uL (ref 0.0–0.7)
Eosinophils Relative: 2 % (ref 0–5)
HCT: 39.3 % (ref 39.0–52.0)
Hemoglobin: 13.7 g/dL (ref 13.0–17.0)
Lymphocytes Relative: 33 % (ref 12–46)
Lymphs Abs: 3 10*3/uL (ref 0.7–4.0)
MCH: 28.6 pg (ref 26.0–34.0)
MCHC: 34.9 g/dL (ref 30.0–36.0)
MCV: 82 fL (ref 78.0–100.0)
MONO ABS: 1.3 10*3/uL — AB (ref 0.1–1.0)
Monocytes Relative: 14 % — ABNORMAL HIGH (ref 3–12)
NEUTROS ABS: 4.8 10*3/uL (ref 1.7–7.7)
Neutrophils Relative %: 51 % (ref 43–77)
Platelets: 118 10*3/uL — ABNORMAL LOW (ref 150–400)
RBC: 4.79 MIL/uL (ref 4.22–5.81)
RDW: 14.7 % (ref 11.5–15.5)
WBC: 9.3 10*3/uL (ref 4.0–10.5)

## 2014-04-27 LAB — BASIC METABOLIC PANEL
Anion gap: 8 (ref 5–15)
BUN: 20 mg/dL (ref 6–23)
CHLORIDE: 112 mmol/L (ref 96–112)
CO2: 23 mmol/L (ref 19–32)
Calcium: 8.5 mg/dL (ref 8.4–10.5)
Creatinine, Ser: 1.5 mg/dL — ABNORMAL HIGH (ref 0.50–1.35)
GFR calc non Af Amer: 47 mL/min — ABNORMAL LOW (ref >90)
GFR, EST AFRICAN AMERICAN: 54 mL/min — AB (ref >90)
GLUCOSE: 111 mg/dL — AB (ref 70–99)
Potassium: 3.9 mmol/L (ref 3.5–5.1)
Sodium: 143 mmol/L (ref 135–145)

## 2014-04-27 MED ORDER — OXYCODONE HCL 5 MG PO TABS: 5.0000 mg | ORAL_TABLET | Freq: Four times a day (QID) | ORAL | Status: DC | PRN

## 2014-04-27 MED ORDER — PREDNISONE 20 MG PO TABS
60.0000 mg | ORAL_TABLET | Freq: Once | ORAL | Status: AC
  Administered 2014-04-27: 60 mg via ORAL
  Filled 2014-04-27: qty 3
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
## 2014-04-27 NOTE — ED Notes (Signed)
Pt reports left side sided pain; thinks he has gout but has been drinking and drugging a lot due to living stressors and he thinks it may have exacerbated his gout. Neuro intact. Is a x 4

## 2014-04-27 NOTE — ED Notes (Signed)
Pt refuses a gown, states "I do not like gowns and I will not wear one, you can give me a shirt and pants."

## 2014-04-27 NOTE — ED Notes (Signed)
Pt is in stable condition upon d/c and is escorted from ED via wheelchair by this RN.

## 2014-04-27 NOTE — Discharge Instructions (Signed)
Gout Gout is when your joints become red, sore, and swell (inflamed). This is caused by the buildup of uric acid crystals in the joints. Uric acid is a chemical that is normally in the blood. If the level of uric acid gets too high in the blood, these crystals form in your joints and tissues. Over time, these crystals can form into masses near the joints and tissues. These masses can destroy bone and cause the bone to look misshapen (deformed). HOME CARE   Do not take aspirin for pain.  Only take medicine as told by your doctor.  Rest the joint as much as you can. When in bed, keep sheets and blankets off painful areas.  Keep the sore joints raised (elevated).  Put warm or cold packs on painful joints. Use of warm or cold packs depends on which works best for you.  Use crutches if the painful joint is in your leg.  Drink enough fluids to keep your pee (urine) clear or pale yellow. Limit alcohol, sugary drinks, and drinks with fructose in them.  Follow your diet instructions. Pay careful attention to how much protein you eat. Include fruits, vegetables, whole grains, and fat-free or low-fat milk products in your daily diet. Talk to your doctor or dietitian about the use of coffee, vitamin C, and cherries. These may help lower uric acid levels.  Keep a healthy body weight. GET HELP RIGHT AWAY IF:   You have watery poop (diarrhea), throw up (vomit), or have any side effects from medicines.  You do not feel better in 24 hours, or you are getting worse.  Your joint becomes suddenly more tender, and you have chills or a fever. MAKE SURE YOU:   Understand these instructions.  Will watch your condition.  Will get help right away if you are not doing well or get worse. Document Released: 10/26/2007 Document Revised: 06/02/2013 Document Reviewed: 08/30/2011 [facility] Patient Information 2015 [place], [place]. This information is not intended to replace advice given to you by your health care  provider. Make sure you discuss any questions you have with your health care provider.  Take pain medicine as directed. Follow-up with your doctor. Also was given a dose of prednisone here prior to discharge. Return for any new or worse symptoms.

## 2014-05-04 ENCOUNTER — Encounter (HOSPITAL_COMMUNITY): Payer: Self-pay | Admitting: General Practice

## 2014-05-04 ENCOUNTER — Emergency Department (HOSPITAL_COMMUNITY)
Admission: EM | Admit: 2014-05-04 | Discharge: 2014-05-04 | Disposition: A | Discharge status: Home / Self Care | Payer: Medicare Other | Attending: Emergency Medicine | Admitting: Emergency Medicine

## 2014-05-04 ENCOUNTER — Emergency Department (HOSPITAL_COMMUNITY): Payer: Medicare Other

## 2014-05-04 DIAGNOSIS — G8929 Other chronic pain: Secondary | ICD-10-CM | POA: Diagnosis not present

## 2014-05-04 DIAGNOSIS — M7989 Other specified soft tissue disorders: Secondary | ICD-10-CM | POA: Insufficient documentation

## 2014-05-04 DIAGNOSIS — Z8619 Personal history of other infectious and parasitic diseases: Secondary | ICD-10-CM | POA: Insufficient documentation

## 2014-05-04 DIAGNOSIS — Z7982 Long term (current) use of aspirin: Secondary | ICD-10-CM | POA: Insufficient documentation

## 2014-05-04 DIAGNOSIS — E669 Obesity, unspecified: Secondary | ICD-10-CM | POA: Insufficient documentation

## 2014-05-04 DIAGNOSIS — Z79899 Other long term (current) drug therapy: Secondary | ICD-10-CM | POA: Insufficient documentation

## 2014-05-04 DIAGNOSIS — I129 Hypertensive chronic kidney disease with stage 1 through stage 4 chronic kidney disease, or unspecified chronic kidney disease: Secondary | ICD-10-CM | POA: Insufficient documentation

## 2014-05-04 DIAGNOSIS — Z720 Tobacco use: Secondary | ICD-10-CM | POA: Diagnosis not present

## 2014-05-04 DIAGNOSIS — M79606 Pain in leg, unspecified: Secondary | ICD-10-CM

## 2014-05-04 DIAGNOSIS — Z8719 Personal history of other diseases of the digestive system: Secondary | ICD-10-CM | POA: Diagnosis not present

## 2014-05-04 DIAGNOSIS — N189 Chronic kidney disease, unspecified: Secondary | ICD-10-CM | POA: Insufficient documentation

## 2014-05-04 DIAGNOSIS — M79662 Pain in left lower leg: Secondary | ICD-10-CM | POA: Diagnosis not present

## 2014-05-04 DIAGNOSIS — I2510 Atherosclerotic heart disease of native coronary artery without angina pectoris: Secondary | ICD-10-CM | POA: Insufficient documentation

## 2014-05-04 DIAGNOSIS — Z792 Long term (current) use of antibiotics: Secondary | ICD-10-CM | POA: Insufficient documentation

## 2014-05-04 DIAGNOSIS — E785 Hyperlipidemia, unspecified: Secondary | ICD-10-CM | POA: Insufficient documentation

## 2014-05-04 DIAGNOSIS — Z791 Long term (current) use of non-steroidal anti-inflammatories (NSAID): Secondary | ICD-10-CM | POA: Diagnosis not present

## 2014-05-04 LAB — CBC WITH DIFFERENTIAL/PLATELET
BASOS ABS: 0 10*3/uL (ref 0.0–0.1)
Basophils Relative: 0 % (ref 0–1)
EOS PCT: 2 % (ref 0–5)
Eosinophils Absolute: 0.2 10*3/uL (ref 0.0–0.7)
HCT: 38.6 % — ABNORMAL LOW (ref 39.0–52.0)
Hemoglobin: 13.6 g/dL (ref 13.0–17.0)
LYMPHS PCT: 29 % (ref 12–46)
Lymphs Abs: 2.3 10*3/uL (ref 0.7–4.0)
MCH: 28.3 pg (ref 26.0–34.0)
MCHC: 35.2 g/dL (ref 30.0–36.0)
MCV: 80.2 fL (ref 78.0–100.0)
MONO ABS: 1.3 10*3/uL — AB (ref 0.1–1.0)
Monocytes Relative: 16 % — ABNORMAL HIGH (ref 3–12)
Neutro Abs: 4 10*3/uL (ref 1.7–7.7)
Neutrophils Relative %: 53 % (ref 43–77)
PLATELETS: 125 10*3/uL — AB (ref 150–400)
RBC: 4.81 MIL/uL (ref 4.22–5.81)
RDW: 14.5 % (ref 11.5–15.5)
WBC: 7.7 10*3/uL (ref 4.0–10.5)

## 2014-05-04 LAB — BASIC METABOLIC PANEL
Anion gap: 6 (ref 5–15)
BUN: 20 mg/dL (ref 6–23)
CALCIUM: 8.8 mg/dL (ref 8.4–10.5)
CO2: 23 mmol/L (ref 19–32)
CREATININE: 1.48 mg/dL — AB (ref 0.50–1.35)
Chloride: 112 mmol/L (ref 96–112)
GFR calc Af Amer: 55 mL/min — ABNORMAL LOW (ref >90)
GFR, EST NON AFRICAN AMERICAN: 48 mL/min — AB (ref >90)
GLUCOSE: 105 mg/dL — AB (ref 70–99)
POTASSIUM: 3.6 mmol/L (ref 3.5–5.1)
SODIUM: 141 mmol/L (ref 135–145)

## 2014-05-04 MED ORDER — KETOROLAC TROMETHAMINE 60 MG/2ML IM SOLN
60.0000 mg | Freq: Once | INTRAMUSCULAR | Status: AC
  Administered 2014-05-04: 60 mg via INTRAMUSCULAR
  Filled 2014-05-04: qty 2

## 2014-05-04 MED ORDER — HYDRALAZINE HCL 25 MG PO TABS
25.0000 mg | ORAL_TABLET | Freq: Every day | ORAL | Status: DC
  Administered 2014-05-04: 25 mg via ORAL
  Filled 2014-05-04 (×2): qty 1
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
## 2014-05-04 NOTE — Discharge Instructions (Signed)
Please recheck with your primary care doctor.  Take your medications as prescribed.  Keep left foot elevated as much as possible when not walking.
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
## 2014-05-04 NOTE — Progress Notes (Signed)
VASCULAR LAB PRELIMINARY  PRELIMINARY  PRELIMINARY  PRELIMINARY  Left lower extremity venous duplex completed.    Preliminary report:  Left:  No evidence of DVT, superficial thrombosis, or Baker's cyst.  Hiroyuki Ozanich, RVT 05/04/2014, 2:19 PM

## 2014-05-24 ENCOUNTER — Emergency Department (INDEPENDENT_AMBULATORY_CARE_PROVIDER_SITE_OTHER)
Admission: EM | Admit: 2014-05-24 | Discharge: 2014-05-24 | Disposition: A | Discharge status: Home / Self Care | Payer: Medicare Other | Source: Home / Self Care | Attending: Emergency Medicine | Admitting: Emergency Medicine

## 2014-05-24 ENCOUNTER — Encounter (HOSPITAL_COMMUNITY): Payer: Self-pay | Admitting: *Deleted

## 2014-05-24 DIAGNOSIS — M25561 Pain in right knee: Secondary | ICD-10-CM | POA: Diagnosis not present

## 2014-05-24 MED ORDER — IBUPROFEN 800 MG PO TABS: 800.0000 mg | ORAL_TABLET | Freq: Three times a day (TID) | ORAL | Status: DC | PRN

## 2014-05-24 MED ORDER — METHYLPREDNISOLONE ACETATE 40 MG/ML IJ SUSP
INTRAMUSCULAR | Status: AC
  Filled 2014-05-24: qty 1

## 2014-05-24 NOTE — ED Notes (Signed)
Knee  Pain   -  Pt  Reports he  Twisted  His  r knee      approx  1  Week  Ago    He reports  Pain / swelling  Of the  Affected  r  Knee      He  Was  Able  To  Ambulate  To  Room  However  Slowly   -     He  Is  Awake  As  Well as  Alert and  Oriented  He      Appears  In no  Acute  Distress      Speaking in  Complete  sentances
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
## 2014-05-24 NOTE — Discharge Instructions (Signed)
You likely tore your meniscus. We did a steroid injection today. Put ice on your knee at least 3 times a day. Keep your leg elevated as much as possible. Take ibuprofen 800 milligrams every 8 hours as needed for pain. If no improvement over the next week, please follow-up with the orthopedic doctor.

## 2014-06-15 ENCOUNTER — Emergency Department (HOSPITAL_COMMUNITY)
Admission: EM | Admit: 2014-06-15 | Discharge: 2014-06-15 | Disposition: A | Discharge status: Home / Self Care | Payer: Medicare Other | Attending: Emergency Medicine | Admitting: Emergency Medicine

## 2014-06-15 ENCOUNTER — Encounter (HOSPITAL_COMMUNITY): Payer: Self-pay | Admitting: Nurse Practitioner

## 2014-06-15 DIAGNOSIS — Z87828 Personal history of other (healed) physical injury and trauma: Secondary | ICD-10-CM | POA: Diagnosis not present

## 2014-06-15 DIAGNOSIS — Z8619 Personal history of other infectious and parasitic diseases: Secondary | ICD-10-CM | POA: Insufficient documentation

## 2014-06-15 DIAGNOSIS — Z872 Personal history of diseases of the skin and subcutaneous tissue: Secondary | ICD-10-CM | POA: Diagnosis not present

## 2014-06-15 DIAGNOSIS — Z7982 Long term (current) use of aspirin: Secondary | ICD-10-CM | POA: Diagnosis not present

## 2014-06-15 DIAGNOSIS — N189 Chronic kidney disease, unspecified: Secondary | ICD-10-CM | POA: Insufficient documentation

## 2014-06-15 DIAGNOSIS — Z8719 Personal history of other diseases of the digestive system: Secondary | ICD-10-CM | POA: Diagnosis not present

## 2014-06-15 DIAGNOSIS — M109 Gout, unspecified: Secondary | ICD-10-CM | POA: Insufficient documentation

## 2014-06-15 DIAGNOSIS — Z792 Long term (current) use of antibiotics: Secondary | ICD-10-CM | POA: Diagnosis not present

## 2014-06-15 DIAGNOSIS — Z79899 Other long term (current) drug therapy: Secondary | ICD-10-CM | POA: Diagnosis not present

## 2014-06-15 DIAGNOSIS — E669 Obesity, unspecified: Secondary | ICD-10-CM | POA: Diagnosis not present

## 2014-06-15 DIAGNOSIS — M79642 Pain in left hand: Secondary | ICD-10-CM | POA: Diagnosis present

## 2014-06-15 DIAGNOSIS — E785 Hyperlipidemia, unspecified: Secondary | ICD-10-CM | POA: Insufficient documentation

## 2014-06-15 DIAGNOSIS — I2510 Atherosclerotic heart disease of native coronary artery without angina pectoris: Secondary | ICD-10-CM | POA: Insufficient documentation

## 2014-06-15 DIAGNOSIS — I129 Hypertensive chronic kidney disease with stage 1 through stage 4 chronic kidney disease, or unspecified chronic kidney disease: Secondary | ICD-10-CM | POA: Insufficient documentation

## 2014-06-15 DIAGNOSIS — Z720 Tobacco use: Secondary | ICD-10-CM | POA: Insufficient documentation

## 2014-06-15 MED ORDER — PREDNISONE 20 MG PO TABS
60.0000 mg | ORAL_TABLET | Freq: Once | ORAL | Status: AC
  Administered 2014-06-15: 60 mg via ORAL
  Filled 2014-06-15: qty 3

## 2014-06-15 MED ORDER — TRAMADOL HCL 50 MG PO TABS
50.0000 mg | ORAL_TABLET | Freq: Once | ORAL | Status: AC
  Administered 2014-06-15: 50 mg via ORAL
  Filled 2014-06-15: qty 1

## 2014-06-15 MED ORDER — PREDNISONE 20 MG PO TABS: ORAL_TABLET | ORAL | Status: DC

## 2014-06-15 MED ORDER — TRAMADOL HCL 50 MG PO TABS: 50.0000 mg | ORAL_TABLET | Freq: Four times a day (QID) | ORAL | Status: DC | PRN
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
## 2014-06-15 NOTE — Discharge Instructions (Signed)
Gout Keep your scheduled appointment with Dr.Blount tomorrow. Avoid alcohol, as alcohol can cause gout flareups. Take your blood pressure medicine and you get home. Today's blood pressure was elevated at 207/83. Ask your doctor to recheck your blood pressure tomorrow. Gout is when your joints become red, sore, and swell (inflamed). This is caused by the buildup of uric acid crystals in the joints. Uric acid is a chemical that is normally in the blood. If the level of uric acid gets too high in the blood, these crystals form in your joints and tissues. Over time, these crystals can form into masses near the joints and tissues. These masses can destroy bone and cause the bone to look misshapen (deformed). HOME CARE   Do not take aspirin for pain.  Only take medicine as told by your doctor.  Rest the joint as much as you can. When in bed, keep sheets and blankets off painful areas.  Keep the sore joints raised (elevated).  Put warm or cold packs on painful joints. Use of warm or cold packs depends on which works best for you.  Use crutches if the painful joint is in your leg.  Drink enough fluids to keep your pee (urine) clear or pale yellow. Limit alcohol, sugary drinks, and drinks with fructose in them.  Follow your diet instructions. Pay careful attention to how much protein you eat. Include fruits, vegetables, whole grains, and fat-free or low-fat milk products in your daily diet. Talk to your doctor or dietitian about the use of coffee, vitamin C, and cherries. These may help lower uric acid levels.  Keep a healthy body weight. GET HELP RIGHT AWAY IF:   You have watery poop (diarrhea), throw up (vomit), or have any side effects from medicines.  You do not feel better in 24 hours, or you are getting worse.  Your joint becomes suddenly more tender, and you have chills or a fever. MAKE SURE YOU:   Understand these instructions.  Will watch your condition.  Will get help right away  if you are not doing well or get worse. Document Released: 10/26/2007 Document Revised: 06/02/2013 Document Reviewed: 08/30/2011 [facility] Patient Information 2015 [place], [place]. This information is not intended to replace advice given to you by your health care provider. Make sure you discuss any questions you have with your health care provider.

## 2014-06-15 NOTE — ED Notes (Signed)
MD at bedside. 

## 2014-06-15 NOTE — ED Notes (Signed)
He reports swelling and pain to L hand onset yesterday. Then today he woke with more swelling, pain and redness to the hand.

## 2014-06-15 NOTE — ED Notes (Signed)
PT ambulated with baseline gait; VSS; A&Ox3; no signs of distress; respirations even and unlabored; skin warm and dry; no questions upon discharge.  

## 2014-06-23 ENCOUNTER — Ambulatory Visit
Admission: RE | Admit: 2014-06-23 | Discharge: 2014-06-23 | Disposition: A | Discharge status: Home / Self Care | Payer: Medicaid Other | Source: Ambulatory Visit | Attending: Family Medicine | Admitting: Family Medicine

## 2014-06-23 ENCOUNTER — Other Ambulatory Visit: Payer: Self-pay | Admitting: Family Medicine

## 2014-06-23 DIAGNOSIS — I10 Essential (primary) hypertension: Secondary | ICD-10-CM

## 2014-07-10 ENCOUNTER — Emergency Department (HOSPITAL_COMMUNITY)
Admission: EM | Admit: 2014-07-10 | Discharge: 2014-07-10 | Disposition: A | Discharge status: Home / Self Care | Payer: Medicare Other | Attending: Emergency Medicine | Admitting: Emergency Medicine

## 2014-07-10 ENCOUNTER — Encounter (HOSPITAL_COMMUNITY): Payer: Self-pay | Admitting: Family Medicine

## 2014-07-10 ENCOUNTER — Emergency Department (HOSPITAL_COMMUNITY): Payer: Medicare Other

## 2014-07-10 DIAGNOSIS — M109 Gout, unspecified: Secondary | ICD-10-CM

## 2014-07-10 DIAGNOSIS — Z79899 Other long term (current) drug therapy: Secondary | ICD-10-CM | POA: Insufficient documentation

## 2014-07-10 DIAGNOSIS — L03114 Cellulitis of left upper limb: Secondary | ICD-10-CM | POA: Insufficient documentation

## 2014-07-10 DIAGNOSIS — M79642 Pain in left hand: Secondary | ICD-10-CM | POA: Diagnosis present

## 2014-07-10 DIAGNOSIS — Z87828 Personal history of other (healed) physical injury and trauma: Secondary | ICD-10-CM | POA: Diagnosis not present

## 2014-07-10 DIAGNOSIS — Z872 Personal history of diseases of the skin and subcutaneous tissue: Secondary | ICD-10-CM | POA: Insufficient documentation

## 2014-07-10 DIAGNOSIS — Z8719 Personal history of other diseases of the digestive system: Secondary | ICD-10-CM | POA: Insufficient documentation

## 2014-07-10 DIAGNOSIS — Z720 Tobacco use: Secondary | ICD-10-CM | POA: Insufficient documentation

## 2014-07-10 DIAGNOSIS — E785 Hyperlipidemia, unspecified: Secondary | ICD-10-CM | POA: Diagnosis not present

## 2014-07-10 DIAGNOSIS — E669 Obesity, unspecified: Secondary | ICD-10-CM | POA: Diagnosis not present

## 2014-07-10 DIAGNOSIS — I129 Hypertensive chronic kidney disease with stage 1 through stage 4 chronic kidney disease, or unspecified chronic kidney disease: Secondary | ICD-10-CM | POA: Insufficient documentation

## 2014-07-10 DIAGNOSIS — I2510 Atherosclerotic heart disease of native coronary artery without angina pectoris: Secondary | ICD-10-CM | POA: Insufficient documentation

## 2014-07-10 DIAGNOSIS — Z8619 Personal history of other infectious and parasitic diseases: Secondary | ICD-10-CM | POA: Diagnosis not present

## 2014-07-10 DIAGNOSIS — Z7982 Long term (current) use of aspirin: Secondary | ICD-10-CM | POA: Diagnosis not present

## 2014-07-10 DIAGNOSIS — N189 Chronic kidney disease, unspecified: Secondary | ICD-10-CM | POA: Insufficient documentation

## 2014-07-10 MED ORDER — TRAMADOL HCL 50 MG PO TABS
50.0000 mg | ORAL_TABLET | Freq: Once | ORAL | Status: AC
  Administered 2014-07-10: 50 mg via ORAL
  Filled 2014-07-10: qty 1

## 2014-07-10 MED ORDER — PREDNISONE 20 MG PO TABS: 40.0000 mg | ORAL_TABLET | Freq: Every day | ORAL | Status: DC

## 2014-07-10 MED ORDER — CEPHALEXIN 500 MG PO CAPS: 500.0000 mg | ORAL_CAPSULE | Freq: Four times a day (QID) | ORAL | Status: DC

## 2014-07-10 NOTE — Discharge Instructions (Signed)
Cellulitis Cellulitis is an infection of the skin and the tissue beneath it. The infected area is usually red and tender. Cellulitis occurs most often in the arms and lower legs.  CAUSES  Cellulitis is caused by bacteria that enter the skin through cracks or cuts in the skin. The most common types of bacteria that cause cellulitis are staphylococci and streptococci. SIGNS AND SYMPTOMS   Redness and warmth.  Swelling.  Tenderness or pain.  Fever. DIAGNOSIS  Your health care provider can usually determine what is wrong based on a physical exam. Blood tests may also be done. TREATMENT  Treatment usually involves taking an antibiotic medicine. HOME CARE INSTRUCTIONS   Take your antibiotic medicine as directed by your health care provider. Finish the antibiotic even if you start to feel better.  Keep the infected arm or leg elevated to reduce swelling.  Apply a warm cloth to the affected area up to 4 times per day to relieve pain.  Take medicines only as directed by your health care provider.  Keep all follow-up visits as directed by your health care provider. SEEK MEDICAL CARE IF:   You notice red streaks coming from the infected area.  Your red area gets larger or turns dark in color.  Your bone or joint underneath the infected area becomes painful after the skin has healed.  Your infection returns in the same area or another area.  You notice a swollen bump in the infected area.  You develop new symptoms.  You have a fever. SEEK IMMEDIATE MEDICAL CARE IF:   You feel very sleepy.  You develop vomiting or diarrhea.  You have a general ill feeling (malaise) with muscle aches and pains. MAKE SURE YOU:   Understand these instructions.  Will watch your condition.  Will get help right away if you are not doing well or get worse. Document Released: 10/26/2004 Document Revised: 06/02/2013 Document Reviewed: 04/03/2011 [facility] Patient Information 2015 [place], [place].  This information is not intended to replace advice given to you by your health care provider. Make sure you discuss any questions you have with your health care provider.  Gout Gout is an inflammatory arthritis caused by a buildup of uric acid crystals in the joints. Uric acid is a chemical that is normally present in the blood. When the level of uric acid in the blood is too high it can form crystals that deposit in your joints and tissues. This causes joint redness, soreness, and swelling (inflammation). Repeat attacks are common. Over time, uric acid crystals can form into masses (tophi) near a joint, destroying bone and causing disfigurement. Gout is treatable and often preventable. CAUSES  The disease begins with elevated levels of uric acid in the blood. Uric acid is produced by your body when it breaks down a naturally found substance called purines. Certain foods you eat, such as meats and fish, contain high amounts of purines. Causes of an elevated uric acid level include:  Being passed down from parent to child (heredity).  Diseases that cause increased uric acid production (such as obesity, psoriasis, and certain cancers).  Excessive alcohol use.  Diet, especially diets rich in meat and seafood.  Medicines, including certain cancer-fighting medicines (chemotherapy), water pills (diuretics), and aspirin.  Chronic kidney disease. The kidneys are no longer able to remove uric acid well.  Problems with metabolism. Conditions strongly associated with gout include:  Obesity.  High blood pressure.  High cholesterol.  Diabetes. Not everyone with elevated uric acid levels gets  gout. It is not understood why some people get gout and others do not. Surgery, joint injury, and eating too much of certain foods are some of the factors that can lead to gout attacks. SYMPTOMS   An attack of gout comes on quickly. It causes intense pain with redness, swelling, and warmth in a joint.  Fever  can occur.  Often, only one joint is involved. Certain joints are more commonly involved:  Base of the big toe.  Knee.  Ankle.  Wrist.  Finger. Without treatment, an attack usually goes away in a few days to weeks. Between attacks, you usually will not have symptoms, which is different from many other forms of arthritis. DIAGNOSIS  Your caregiver will suspect gout based on your symptoms and exam. In some cases, tests may be recommended. The tests may include:  Blood tests.  Urine tests.  X-rays.  Joint fluid exam. This exam requires a needle to remove fluid from the joint (arthrocentesis). Using a microscope, gout is confirmed when uric acid crystals are seen in the joint fluid. TREATMENT  There are two phases to gout treatment: treating the sudden onset (acute) attack and preventing attacks (prophylaxis).  Treatment of an Acute Attack.  Medicines are used. These include anti-inflammatory medicines or steroid medicines.  An injection of steroid medicine into the affected joint is sometimes necessary.  The painful joint is rested. Movement can worsen the arthritis.  You may use warm or cold treatments on painful joints, depending which works best for you.  Treatment to Prevent Attacks.  If you suffer from frequent gout attacks, your caregiver may advise preventive medicine. These medicines are started after the acute attack subsides. These medicines either help your kidneys eliminate uric acid from your body or decrease your uric acid production. You may need to stay on these medicines for a very long time.  The early phase of treatment with preventive medicine can be associated with an increase in acute gout attacks. For this reason, during the first few months of treatment, your caregiver may also advise you to take medicines usually used for acute gout treatment. Be sure you understand your caregiver's directions. Your caregiver may make several adjustments to your medicine  dose before these medicines are effective.  Discuss dietary treatment with your caregiver or dietitian. Alcohol and drinks high in sugar and fructose and foods such as meat, poultry, and seafood can increase uric acid levels. Your caregiver or dietitian can advise you on drinks and foods that should be limited. HOME CARE INSTRUCTIONS   Do not take aspirin to relieve pain. This raises uric acid levels.  Only take over-the-counter or prescription medicines for pain, discomfort, or fever as directed by your caregiver.  Rest the joint as much as possible. When in bed, keep sheets and blankets off painful areas.  Keep the affected joint raised (elevated).  Apply warm or cold treatments to painful joints. Use of warm or cold treatments depends on which works best for you.  Use crutches if the painful joint is in your leg.  Drink enough fluids to keep your urine clear or pale yellow. This helps your body get rid of uric acid. Limit alcohol, sugary drinks, and fructose drinks.  Follow your dietary instructions. Pay careful attention to the amount of protein you eat. Your daily diet should emphasize fruits, vegetables, whole grains, and fat-free or low-fat milk products. Discuss the use of coffee, vitamin C, and cherries with your caregiver or dietitian. These may be helpful in  lowering uric acid levels.  Maintain a healthy body weight. SEEK MEDICAL CARE IF:   You develop diarrhea, vomiting, or any side effects from medicines.  You do not feel better in 24 hours, or you are getting worse. SEEK IMMEDIATE MEDICAL CARE IF:   Your joint becomes suddenly more tender, and you have chills or a fever. MAKE SURE YOU:   Understand these instructions.  Will watch your condition.  Will get help right away if you are not doing well or get worse. Document Released: 01/14/2000 Document Revised: 06/02/2013 Document Reviewed: 08/30/2011 [facility] Patient Information 2015 [place], [place]. This information  is not intended to replace advice given to you by your health care provider. Make sure you discuss any questions you have with your health care provider.

## 2014-07-10 NOTE — ED Notes (Signed)
Pt here for gout in left hand. sts was seen here 2 weeks ago and the pain has improved but hand still feels tight.
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
## 2014-08-02 ENCOUNTER — Encounter (HOSPITAL_COMMUNITY): Payer: Self-pay | Admitting: Emergency Medicine

## 2014-08-02 ENCOUNTER — Emergency Department (HOSPITAL_COMMUNITY)
Admission: EM | Admit: 2014-08-02 | Discharge: 2014-08-02 | Disposition: A | Discharge status: Home / Self Care | Payer: Medicare Other | Attending: Emergency Medicine | Admitting: Emergency Medicine

## 2014-08-02 DIAGNOSIS — N189 Chronic kidney disease, unspecified: Secondary | ICD-10-CM | POA: Diagnosis not present

## 2014-08-02 DIAGNOSIS — I2510 Atherosclerotic heart disease of native coronary artery without angina pectoris: Secondary | ICD-10-CM | POA: Insufficient documentation

## 2014-08-02 DIAGNOSIS — I129 Hypertensive chronic kidney disease with stage 1 through stage 4 chronic kidney disease, or unspecified chronic kidney disease: Secondary | ICD-10-CM | POA: Diagnosis not present

## 2014-08-02 DIAGNOSIS — E785 Hyperlipidemia, unspecified: Secondary | ICD-10-CM | POA: Insufficient documentation

## 2014-08-02 DIAGNOSIS — Z79899 Other long term (current) drug therapy: Secondary | ICD-10-CM | POA: Diagnosis not present

## 2014-08-02 DIAGNOSIS — Z720 Tobacco use: Secondary | ICD-10-CM | POA: Insufficient documentation

## 2014-08-02 DIAGNOSIS — R252 Cramp and spasm: Secondary | ICD-10-CM | POA: Insufficient documentation

## 2014-08-02 DIAGNOSIS — Z8619 Personal history of other infectious and parasitic diseases: Secondary | ICD-10-CM | POA: Diagnosis not present

## 2014-08-02 DIAGNOSIS — Z87828 Personal history of other (healed) physical injury and trauma: Secondary | ICD-10-CM | POA: Insufficient documentation

## 2014-08-02 DIAGNOSIS — Z8719 Personal history of other diseases of the digestive system: Secondary | ICD-10-CM | POA: Diagnosis not present

## 2014-08-02 DIAGNOSIS — M79661 Pain in right lower leg: Secondary | ICD-10-CM | POA: Diagnosis present

## 2014-08-02 DIAGNOSIS — E669 Obesity, unspecified: Secondary | ICD-10-CM | POA: Insufficient documentation

## 2014-08-02 DIAGNOSIS — Z7982 Long term (current) use of aspirin: Secondary | ICD-10-CM | POA: Insufficient documentation

## 2014-08-02 DIAGNOSIS — Z872 Personal history of diseases of the skin and subcutaneous tissue: Secondary | ICD-10-CM | POA: Insufficient documentation

## 2014-08-02 LAB — CBC
HCT: 40.1 % (ref 39.0–52.0)
Hemoglobin: 14.2 g/dL (ref 13.0–17.0)
MCH: 28.5 pg (ref 26.0–34.0)
MCHC: 35.4 g/dL (ref 30.0–36.0)
MCV: 80.5 fL (ref 78.0–100.0)
Platelets: 101 10*3/uL — ABNORMAL LOW (ref 150–400)
RBC: 4.98 MIL/uL (ref 4.22–5.81)
RDW: 15.4 % (ref 11.5–15.5)
WBC: 5.9 10*3/uL (ref 4.0–10.5)

## 2014-08-02 LAB — BASIC METABOLIC PANEL
ANION GAP: 9 (ref 5–15)
BUN: 20 mg/dL (ref 6–20)
CALCIUM: 8.6 mg/dL — AB (ref 8.9–10.3)
CO2: 21 mmol/L — ABNORMAL LOW (ref 22–32)
Chloride: 108 mmol/L (ref 101–111)
Creatinine, Ser: 1.47 mg/dL — ABNORMAL HIGH (ref 0.61–1.24)
GFR calc non Af Amer: 48 mL/min — ABNORMAL LOW (ref >60)
GFR, EST AFRICAN AMERICAN: 55 mL/min — AB (ref >60)
GLUCOSE: 109 mg/dL — AB (ref 65–99)
POTASSIUM: 4 mmol/L (ref 3.5–5.1)
SODIUM: 138 mmol/L (ref 135–145)

## 2014-08-02 LAB — MAGNESIUM: Magnesium: 2 mg/dL (ref 1.7–2.4)

## 2014-08-02 MED ORDER — TRAMADOL HCL 50 MG PO TABS
50.0000 mg | ORAL_TABLET | Freq: Once | ORAL | Status: AC
  Administered 2014-08-02: 50 mg via ORAL
  Filled 2014-08-02: qty 1

## 2014-08-02 MED ORDER — TRAMADOL HCL 50 MG PO TABS: 50.0000 mg | ORAL_TABLET | Freq: Four times a day (QID) | ORAL | Status: DC | PRN
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
## 2014-08-02 NOTE — ED Notes (Signed)
Took 3 Aleve tablets prior to arrival.

## 2014-08-02 NOTE — ED Notes (Signed)
Pt. Stated, I think Im having a flare up of gout.  It started in both feet and its in my rt. Leg

## 2014-08-02 NOTE — Discharge Instructions (Signed)

## 2014-09-21 ENCOUNTER — Encounter (HOSPITAL_COMMUNITY): Payer: Self-pay | Admitting: *Deleted

## 2014-09-21 ENCOUNTER — Emergency Department (HOSPITAL_COMMUNITY): Payer: Medicare Other

## 2014-09-21 ENCOUNTER — Emergency Department (HOSPITAL_COMMUNITY)
Admission: EM | Admit: 2014-09-21 | Discharge: 2014-09-21 | Disposition: A | Discharge status: Home / Self Care | Payer: Medicare Other | Attending: Emergency Medicine | Admitting: Emergency Medicine

## 2014-09-21 DIAGNOSIS — I2510 Atherosclerotic heart disease of native coronary artery without angina pectoris: Secondary | ICD-10-CM | POA: Insufficient documentation

## 2014-09-21 DIAGNOSIS — S93402A Sprain of unspecified ligament of left ankle, initial encounter: Secondary | ICD-10-CM | POA: Insufficient documentation

## 2014-09-21 DIAGNOSIS — Z7982 Long term (current) use of aspirin: Secondary | ICD-10-CM | POA: Insufficient documentation

## 2014-09-21 DIAGNOSIS — S99912A Unspecified injury of left ankle, initial encounter: Secondary | ICD-10-CM | POA: Diagnosis present

## 2014-09-21 DIAGNOSIS — Y998 Other external cause status: Secondary | ICD-10-CM | POA: Insufficient documentation

## 2014-09-21 DIAGNOSIS — Y939 Activity, unspecified: Secondary | ICD-10-CM | POA: Diagnosis not present

## 2014-09-21 DIAGNOSIS — Z79899 Other long term (current) drug therapy: Secondary | ICD-10-CM | POA: Insufficient documentation

## 2014-09-21 DIAGNOSIS — E785 Hyperlipidemia, unspecified: Secondary | ICD-10-CM | POA: Diagnosis not present

## 2014-09-21 DIAGNOSIS — Z792 Long term (current) use of antibiotics: Secondary | ICD-10-CM | POA: Insufficient documentation

## 2014-09-21 DIAGNOSIS — X58XXXA Exposure to other specified factors, initial encounter: Secondary | ICD-10-CM | POA: Diagnosis not present

## 2014-09-21 DIAGNOSIS — Y929 Unspecified place or not applicable: Secondary | ICD-10-CM | POA: Diagnosis not present

## 2014-09-21 DIAGNOSIS — N189 Chronic kidney disease, unspecified: Secondary | ICD-10-CM | POA: Diagnosis not present

## 2014-09-21 DIAGNOSIS — E669 Obesity, unspecified: Secondary | ICD-10-CM | POA: Insufficient documentation

## 2014-09-21 DIAGNOSIS — I129 Hypertensive chronic kidney disease with stage 1 through stage 4 chronic kidney disease, or unspecified chronic kidney disease: Secondary | ICD-10-CM | POA: Insufficient documentation

## 2014-09-21 DIAGNOSIS — Z720 Tobacco use: Secondary | ICD-10-CM | POA: Diagnosis not present

## 2014-09-21 DIAGNOSIS — Z8619 Personal history of other infectious and parasitic diseases: Secondary | ICD-10-CM | POA: Insufficient documentation

## 2014-09-21 MED ORDER — OXYCODONE HCL 5 MG PO TABS
5.0000 mg | ORAL_TABLET | Freq: Once | ORAL | Status: AC
  Administered 2014-09-21: 5 mg via ORAL
  Filled 2014-09-21: qty 1

## 2014-09-21 MED ORDER — NAPROXEN 500 MG PO TABS: 500.0000 mg | ORAL_TABLET | Freq: Two times a day (BID) | ORAL | Status: DC

## 2014-09-21 NOTE — ED Notes (Signed)
Pt states he stepped off a curb on Friday night and is still having pain. Pt is able to ambulate with no assistance.

## 2014-09-21 NOTE — Discharge Instructions (Signed)
1. Medications: naprosyn, usual home medications 2. Treatment: rest, drink plenty of fluids, ice, rest, use brace 3. Follow Up: Please followup with your primary doctor in 2 days for discussion of your diagnoses and further evaluation after today's visit; if you do not have a primary care doctor use the resource guide provided to find one; Please return to the ER for worsening symptoms    Ankle Sprain An ankle sprain is an injury to the strong, fibrous tissues (ligaments) that hold the bones of your ankle joint together.  CAUSES An ankle sprain is usually caused by a fall or by twisting your ankle. Ankle sprains most commonly occur when you step on the outer edge of your foot, and your ankle turns inward. People who participate in sports are more prone to these types of injuries.  SYMPTOMS   Pain in your ankle. The pain may be present at rest or only when you are trying to stand or walk.  Swelling.  Bruising. Bruising may develop immediately or within 1 to 2 days after your injury.  Difficulty standing or walking, particularly when turning corners or changing directions. DIAGNOSIS  Your caregiver will ask you details about your injury and perform a physical exam of your ankle to determine if you have an ankle sprain. During the physical exam, your caregiver will press on and apply pressure to specific areas of your foot and ankle. Your caregiver will try to move your ankle in certain ways. An X-ray exam may be done to be sure a bone was not broken or a ligament did not separate from one of the bones in your ankle (avulsion fracture).  TREATMENT  Certain types of braces can help stabilize your ankle. Your caregiver can make a recommendation for this. Your caregiver may recommend the use of medicine for pain. If your sprain is severe, your caregiver may refer you to a surgeon who helps to restore function to parts of your skeletal system (orthopedist) or a physical therapist. [place] ice to your injury for 1-2 days or as directed by your caregiver. Applying ice helps to reduce inflammation and pain.  Put ice in a plastic bag.  Place a towel between your skin and the bag.  Leave the ice on for 15-20 minutes at a time, every 2 hours while you are awake.  Only take over-the-counter or prescription medicines for pain, discomfort, or fever as directed by your caregiver.  Elevate your injured ankle above the level of your heart as much as possible for 2-3 days.  If your caregiver recommends crutches, use them as instructed. Gradually put weight on the affected ankle. Continue to use crutches or a cane until you can walk without feeling pain in your ankle.  If you have a plaster splint, wear the splint as directed by your caregiver. Do not rest it on anything harder than a pillow for the first 24 hours. Do not put weight on it. Do not get it wet. You may take it off to take a shower or bath.  You may have been given an elastic bandage to wear around your ankle to provide support. If the elastic bandage is too tight (you have numbness or tingling in your foot or your foot becomes cold and blue), adjust the bandage to make it comfortable.  If you have an air splint, you may blow more air into it or let air out to make it more comfortable. You may take your splint off  at night and before taking a shower or bath. Wiggle your toes in the splint several times per day to decrease swelling. SEEK MEDICAL CARE IF:   You have rapidly increasing bruising or swelling.  Your toes feel extremely cold or you lose feeling in your foot.  Your pain is not relieved with medicine. SEEK IMMEDIATE MEDICAL CARE IF:  Your toes are numb or blue.  You have severe pain that is increasing. MAKE SURE YOU:   Understand these instructions.  Will watch your condition.  Will get help right away if you are not doing well or get worse. Document Released: 01/16/2005 Document  Revised: 10/11/2011 Document Reviewed: 01/28/2011 [facility] Patient Information 2015 [place], [place]. This information is not intended to replace advice given to you by your health care provider. Make sure you discuss any questions you have with your health care provider.
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
## 2014-09-21 NOTE — ED Notes (Signed)
Pt is in stable condition upon d/c and ambulates from ED. 

## 2014-10-30 ENCOUNTER — Other Ambulatory Visit (HOSPITAL_COMMUNITY): Payer: Self-pay | Admitting: Nurse Practitioner

## 2014-10-30 DIAGNOSIS — B182 Chronic viral hepatitis C: Secondary | ICD-10-CM

## 2014-11-02 ENCOUNTER — Encounter (HOSPITAL_COMMUNITY): Payer: Self-pay | Admitting: Emergency Medicine

## 2014-11-02 ENCOUNTER — Emergency Department (HOSPITAL_COMMUNITY)
Admission: EM | Admit: 2014-11-02 | Discharge: 2014-11-02 | Disposition: A | Discharge status: Home / Self Care | Payer: Medicare Other | Attending: Emergency Medicine | Admitting: Emergency Medicine

## 2014-11-02 DIAGNOSIS — M79604 Pain in right leg: Secondary | ICD-10-CM | POA: Diagnosis not present

## 2014-11-02 DIAGNOSIS — N189 Chronic kidney disease, unspecified: Secondary | ICD-10-CM | POA: Diagnosis not present

## 2014-11-02 DIAGNOSIS — Z79899 Other long term (current) drug therapy: Secondary | ICD-10-CM | POA: Diagnosis not present

## 2014-11-02 DIAGNOSIS — Z720 Tobacco use: Secondary | ICD-10-CM | POA: Diagnosis not present

## 2014-11-02 DIAGNOSIS — F1410 Cocaine abuse, uncomplicated: Secondary | ICD-10-CM | POA: Insufficient documentation

## 2014-11-02 DIAGNOSIS — F1910 Other psychoactive substance abuse, uncomplicated: Secondary | ICD-10-CM

## 2014-11-02 DIAGNOSIS — F1010 Alcohol abuse, uncomplicated: Secondary | ICD-10-CM | POA: Diagnosis not present

## 2014-11-02 DIAGNOSIS — I2510 Atherosclerotic heart disease of native coronary artery without angina pectoris: Secondary | ICD-10-CM | POA: Diagnosis not present

## 2014-11-02 DIAGNOSIS — R040 Epistaxis: Secondary | ICD-10-CM | POA: Diagnosis not present

## 2014-11-02 DIAGNOSIS — Z8619 Personal history of other infectious and parasitic diseases: Secondary | ICD-10-CM | POA: Insufficient documentation

## 2014-11-02 DIAGNOSIS — Z792 Long term (current) use of antibiotics: Secondary | ICD-10-CM | POA: Insufficient documentation

## 2014-11-02 DIAGNOSIS — E785 Hyperlipidemia, unspecified: Secondary | ICD-10-CM | POA: Insufficient documentation

## 2014-11-02 DIAGNOSIS — E669 Obesity, unspecified: Secondary | ICD-10-CM | POA: Diagnosis not present

## 2014-11-02 DIAGNOSIS — R600 Localized edema: Secondary | ICD-10-CM | POA: Diagnosis not present

## 2014-11-02 DIAGNOSIS — M79605 Pain in left leg: Secondary | ICD-10-CM | POA: Diagnosis not present

## 2014-11-02 DIAGNOSIS — I129 Hypertensive chronic kidney disease with stage 1 through stage 4 chronic kidney disease, or unspecified chronic kidney disease: Secondary | ICD-10-CM | POA: Diagnosis not present

## 2014-11-02 DIAGNOSIS — Z7982 Long term (current) use of aspirin: Secondary | ICD-10-CM | POA: Diagnosis not present

## 2014-11-02 DIAGNOSIS — R252 Cramp and spasm: Secondary | ICD-10-CM

## 2014-11-02 LAB — CBC WITH DIFFERENTIAL/PLATELET
Basophils Absolute: 0 10*3/uL (ref 0.0–0.1)
Basophils Relative: 0 %
EOS ABS: 0.2 10*3/uL (ref 0.0–0.7)
Eosinophils Relative: 3 %
HCT: 34.9 % — ABNORMAL LOW (ref 39.0–52.0)
HEMOGLOBIN: 12 g/dL — AB (ref 13.0–17.0)
LYMPHS ABS: 2 10*3/uL (ref 0.7–4.0)
LYMPHS PCT: 35 %
MCH: 28.6 pg (ref 26.0–34.0)
MCHC: 34.4 g/dL (ref 30.0–36.0)
MCV: 83.1 fL (ref 78.0–100.0)
MONOS PCT: 12 %
Monocytes Absolute: 0.7 10*3/uL (ref 0.1–1.0)
NEUTROS PCT: 50 %
Neutro Abs: 2.9 10*3/uL (ref 1.7–7.7)
Platelets: 85 10*3/uL — ABNORMAL LOW (ref 150–400)
RBC: 4.2 MIL/uL — AB (ref 4.22–5.81)
RDW: 14.9 % (ref 11.5–15.5)
WBC: 5.8 10*3/uL (ref 4.0–10.5)

## 2014-11-02 LAB — COMPREHENSIVE METABOLIC PANEL
ALK PHOS: 112 U/L (ref 38–126)
ALT: 43 U/L (ref 17–63)
ANION GAP: 8 (ref 5–15)
AST: 76 U/L — ABNORMAL HIGH (ref 15–41)
Albumin: 2.7 g/dL — ABNORMAL LOW (ref 3.5–5.0)
BILIRUBIN TOTAL: 1.3 mg/dL — AB (ref 0.3–1.2)
BUN: 19 mg/dL (ref 6–20)
CALCIUM: 8.1 mg/dL — AB (ref 8.9–10.3)
CO2: 21 mmol/L — AB (ref 22–32)
CREATININE: 1.94 mg/dL — AB (ref 0.61–1.24)
Chloride: 109 mmol/L (ref 101–111)
GFR, EST AFRICAN AMERICAN: 39 mL/min — AB (ref >60)
GFR, EST NON AFRICAN AMERICAN: 34 mL/min — AB (ref >60)
Glucose, Bld: 114 mg/dL — ABNORMAL HIGH (ref 65–99)
Potassium: 3.8 mmol/L (ref 3.5–5.1)
Sodium: 138 mmol/L (ref 135–145)
TOTAL PROTEIN: 6.5 g/dL (ref 6.5–8.1)

## 2014-11-02 LAB — CK: CK TOTAL: 178 U/L (ref 49–397)

## 2014-11-02 LAB — ETHANOL

## 2014-11-02 MED ORDER — SILVER NITRATE-POT NITRATE 75-25 % EX MISC
1.0000 "application " | Freq: Once | CUTANEOUS | Status: AC
  Administered 2014-11-02: 1 via TOPICAL
  Filled 2014-11-02: qty 1

## 2014-11-02 MED ORDER — SODIUM CHLORIDE 0.9 % IV BOLUS (SEPSIS): 1000.0000 mL | Freq: Once | INTRAVENOUS | Status: DC

## 2014-11-02 MED ORDER — OXYMETAZOLINE HCL 0.05 % NA SOLN
1.0000 | Freq: Once | NASAL | Status: AC
  Administered 2014-11-02: 1 via NASAL
  Filled 2014-11-02: qty 15

## 2014-11-02 MED ORDER — SILVER NITRATE-POT NITRATE 75-25 % EX MISC: 1.0000 "application " | Freq: Once | CUTANEOUS | Status: DC

## 2014-11-02 NOTE — ED Notes (Signed)
Spoke with Pharmacy advised Silver Nitrate needed advised will send.

## 2014-11-02 NOTE — ED Notes (Signed)
Patient comes from home stated once he got off the bus he started having some leg cramps. Patient states leg tend to cramp about he drinks. Patient states he drank 0.5 gallon of Brandy and a 12 pack of beer. Patient then complains of a nosebleed states he gets those whenever he does Cocaine. Patient states he did cocaine last night about 1 gram. Patient denies any Chest pain, SOB, N/V/ Patient alert and Oriented x4 on arrival

## 2014-11-02 NOTE — ED Notes (Signed)
MD notified BP is 213/74
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
## 2014-11-02 NOTE — Discharge Instructions (Signed)
1. Medications: usual home medications 2. Treatment: rest, drink plenty of fluids 3. Follow Up: please followup with your primary doctor in 2-3 days for discussion of your diagnoses, further evaluation after today's visit, and repeat creatinine/BMP; if you do not have a primary care doctor use the resource guide provided to find one; please return to the ER for severe headache, chest pain, shortness of breath, new or worsening symptoms   Muscle Cramps and Spasms Muscle cramps and spasms are when muscles tighten by themselves. They usually get better within minutes. Muscle cramps are painful. They are usually stronger and last longer than muscle spasms. Muscle spasms may or may not be painful. They can last a few seconds or much longer. HOME CARE  Drink enough fluid to keep your pee (urine) clear or pale yellow.  Massage, stretch, and relax the muscle.  Use a warm towel, heating pad, or warm shower water on tight muscles.  Place ice on the muscle if it is tender or in pain.  Put ice in a plastic bag.  Place a towel between your skin and the bag.  Leave the ice on for 15-20 minutes, 03-04 times a day.  Only take medicine as told by your doctor. GET HELP RIGHT AWAY IF:  Your cramps or spasms get worse, happen more often, or do not get better with time. MAKE SURE YOU:  Understand these instructions.  Will watch your condition.  Will get help right away if you are not doing well or get worse. Document Released: 12/30/2007 Document Revised: 05/13/2012 Document Reviewed: 01/03/2012 [facility] Patient Information 2015 [place], [place]. This information is not intended to replace advice given to you by your health care provider. Make sure you discuss any questions you have with your health care provider.   Emergency Department Resource Guide 1) Find a Doctor and Pay Out of Pocket Although you won't have to find out who is covered by your insurance plan, it is a good idea to ask around and  get recommendations. You will then need to call the office and see if the doctor you have chosen will accept you as a new patient and what types of options they offer for patients who are self-pay. Some doctors offer discounts or will set up payment plans for their patients who do not have insurance, but you will need to ask so you aren't surprised when you get to your appointment.  2) Contact Your Local Health Department Not all health departments have doctors that can see patients for sick visits, but many do, so it is worth a call to see if yours does. If you don't know where your local health department is, you can check in your phone book. The CDC also has a tool to help you locate your state's health department, and many state websites also have listings of all of their local health departments.  3) Find a [facility] If your illness is not likely to be very severe or complicated, you may want to try a walk in clinic. These are popping up all over the country in pharmacies, drugstores, and shopping centers. They're usually staffed by nurse practitioners or physician assistants that have been trained to treat common illnesses and complaints. They're usually fairly quick and inexpensive. However, if you have serious medical issues or chronic medical problems, these are probably not your best option.  No Primary Care Doctor: - Call Health Connect at  [phone] - they can help you locate a primary care doctor that  accepts your insurance, provides certain services, etc. - Physician Referral Service- [phone]  Chronic Pain Problems: Organization         Address  Phone   Notes  [facility]  [phone] Patients need to be referred by their primary care doctor.   Medication Assistance: Organization         Address  Phone   Notes  [facility] Medication [facility] [address] [place], [place] [address] [phone] --Must be a resident of  [facility] -- Must have NO insurance coverage whatsoever (no Medicaid/ Medicare, etc.) -- The pt. MUST have a primary care doctor that directs their care regularly and follows them in the community   MedAssist  [phone]   [employer]  [phone]    Agencies that provide inexpensive medical care: Organization         Address  Phone   Notes  [place]  [phone]   [facility]    [phone]   [facility] [place], [place] [address] [phone]   [place] [address], [place] [phone]   Planned Parenthood    [phone]   [facility]    [phone]   [place] and [place] [address], [place] Phone:  [phone], Fax:  [phone] Hours of Operation:  9 am - 6 pm, M-F.  Also accepts Medicaid/Medicare and self-pay.  [facility] for [address], [address], [place] Phone: [phone], Fax: [phone]. Hours of Operation:  8:30 am - 5:30 pm, M-F.  Also accepts Medicaid and self-pay.  [facility] [place] [address] [place] Phone: [phone]   [place], [place], [place] [phone], Ext. [phone] Mondays & Thursdays: 7-9 AM.  First 15 patients are seen on a first come, first serve basis.    [place] Providers:  Organization         Address  Phone   Notes  [facility] [address], [place] [phone] Also accepts self-pay patients.  [facility] [id number] [place], [place]  [phone]   [place], [address], [place] [phone]   [facility] [address] [place] [phone]   Lucianne Lei [address], [place]   [phone] Only accepts [place] Access  [place] patients after they have their name applied to their card.   Self-Pay (no insurance) in [facility]:  Organization         Address  Phone   Notes  Sickle Cell Patients, [facility] Internal [facility] [place] [phone]   [facility] [place] [phone]   [facility] [place]  [place], [place], [place] [phone]   [facility]/Dr. Osei-Bonsu  [address] [place] or [address], [place] [phone] Phone number for both [place] and [place] locations is the same.  Urgent Medical and [facility] [address] [place] [phone]   [facility] [address], [place] or [address] [phone]   [facility] [place] [phone], phone; [phone], fax Sees patients 1st and 3rd Saturday of every month.  Must not qualify for public or private insurance (i.e. Medicaid, Medicare, [place] Health Choice, Veterans' Benefits)  Household income should be no more than 200% of the poverty level The clinic cannot treat you if you are pregnant or think you are pregnant  Sexually transmitted diseases are not treated at the clinic.    Dental Care: Organization         Address  Phone  Notes  [facility] Department of [facility] [place] [phone] Accepts children up to age 56 who are enrolled in [place] or [place]; pregnant women with a Medicaid card; and children who have applied for Medicaid or [place] Health Choice, but were declined, whose parents can pay a reduced fee at time of service.  [facility] Department of [facility]  [address], [place] [phone] Accepts children up to age 46 who are enrolled in [place] or [place]; pregnant women with a Medicaid  card; and children who have applied for Medicaid or [place] Health Choice, but were declined, whose parents can pay a reduced fee at time of service.  [place] Adult Dental Access PROGRAM  [place] [phone] Patients are seen by appointment only. Walk-ins are not accepted. [place] will see patients 30 years of age and older. Monday - Tuesday (8am-5pm) Most Wednesdays (8:30-5pm) $30 per visit, cash only  [facility] Adult Dental Access PROGRAM  [address], [facility] [phone] Patients are seen by appointment only. Walk-ins are not accepted. [place] will see patients 41 years of age and older. One Wednesday Evening (Monthly: Volunteer Based).  $30 per visit, cash only  [place]  [phone] for adults; Children under age 32, call Graduate Pediatric Dentistry at [phone]. Children aged 53-14, please call [phone] to request a pediatric application.  Dental services are provided in all areas of dental care including fillings, crowns and bridges, complete and partial dentures, implants, gum treatment, root canals, and extractions. Preventive care is also provided. Treatment is provided to both adults and children. Patients are selected via a lottery and there is often a waiting list.   [facility] [address] [place]  [phone] [email or web address]   [facility] [address] [place], [place] [phone], Ext. [phone] Second and Fourth Thursday of each month, opens at 6:30 AM; Clinic ends at 9 AM.  Patients are seen on a first-come first-served basis, and a limited number are seen during each clinic.   [facility]  [address] Hillard Danker [place], [place] [phone]   Eligibility Requirements You must have lived in [place], [place], or [place] counties for at least the last three months.   You cannot be eligible for state or federal sponsored [employer],  including [employer], [place], or [employer].   You generally cannot be eligible for healthcare insurance through your employer.    How to apply: Eligibility screenings are held every Tuesday and Wednesday afternoon from 1:00 pm until 4:00 pm. You do not need an appointment for the interview!  [facility] [address] [place], [place]   [place]  [place] Department  [place] Department  [place] in the Community: Intensive Outpatient Programs Organization         Address  Phone  Notes  [facility] [place]. [address], [place], [place] [phone]   [facility] [address] [place], [place]   ADS: Alcohol & Drug Svcs [address], [place], [place]   [place] [address]  [place], [place] or [phone]   Substance Abuse Resources Organization         Address  Phone  Notes  Alcohol and Drug Services  [phone]   [place]  [phone]   [facility] [place]   Chinita Pester  [phone]   Residential & Outpatient Substance Abuse Program  [phone]   Psychological Services Organization         Address  Phone  Notes  [facility] [place]  [place]  [phone]   [place] [address], [place] or [phone]    Mobile Crisis Teams Organization         Address  Phone  Notes  [facility], Mobile Crisis Care Unit  [phone]   [facility]  [address] [place], [place]   [place] [address], [place] [phone]    Self-Help/Support Groups Organization         Address  Phone             Notes  [place].  of [place] - variety of support groups  [place] Call for more information  Narcotics Anonymous (NA), [facility] [address], [employer] [place]  2 meetings at this location   Special educational needs teacher         Address  Phone  Notes  [facility]idential [facility] [place],    [place]  [phone]   [facility]  [address] [place] [id number], [place], [place]   [place], [place] [phone] Admissions: 8am-3pm M-F  Incentives Substance [place] [address]    [place], [place] [id number]   [facility] [address] [place], [place] [employer], [place]   [facility] [address]  [place], [place]   [facility] - Intensive Outpatient [address] Kristeen [address], [place], [place]   [facility] ([place].,  [place], [place] [phone] or [phone]   [facility] (RTS) [address] [place], [place] Accepts Medicaid  [facility] [place] [address]  [place] [phone] Substance Abuse/Addiction Treatment   [facility] Organization         Address  Phone  Notes  [facility]  [phone]   Domenic Schwab, PhD [address] [place]lis [place], [place]   [phone] or [phone]   [place], [place] [phone]   [place] [address], [place], [place] [phone] Insurance/Medicaid/sponsorship through [employer] and Families [address] [place]                                    [place], [place] [phone] [place] [address]  [place], [place] [phone]    Dr. Adele Schilder  [phone]   [facility] Dept. 1) [address], [place] 2)  [place] 3)  [place] [address], [place] [phone]  [phone]   [place] [phone] or [phone] (After Hours)

## 2014-11-02 NOTE — ED Notes (Signed)
MD notified silver nitrate at bedside. Patient given sandwhich.

## 2014-11-19 ENCOUNTER — Ambulatory Visit (HOSPITAL_COMMUNITY): Admission: RE | Admit: 2014-11-19 | Payer: Medicare Other | Source: Ambulatory Visit

## 2015-11-07 ENCOUNTER — Emergency Department (HOSPITAL_COMMUNITY)
Admission: EM | Admit: 2015-11-07 | Discharge: 2015-11-08 | Disposition: A | Discharge status: Home / Self Care | Payer: Medicare Other | Attending: Emergency Medicine | Admitting: Emergency Medicine

## 2015-11-07 ENCOUNTER — Encounter (HOSPITAL_COMMUNITY): Payer: Self-pay

## 2015-11-07 DIAGNOSIS — Z79899 Other long term (current) drug therapy: Secondary | ICD-10-CM | POA: Diagnosis not present

## 2015-11-07 DIAGNOSIS — Z7982 Long term (current) use of aspirin: Secondary | ICD-10-CM | POA: Insufficient documentation

## 2015-11-07 DIAGNOSIS — F17210 Nicotine dependence, cigarettes, uncomplicated: Secondary | ICD-10-CM | POA: Insufficient documentation

## 2015-11-07 DIAGNOSIS — F1910 Other psychoactive substance abuse, uncomplicated: Secondary | ICD-10-CM | POA: Diagnosis not present

## 2015-11-07 DIAGNOSIS — I2510 Atherosclerotic heart disease of native coronary artery without angina pectoris: Secondary | ICD-10-CM | POA: Diagnosis not present

## 2015-11-07 DIAGNOSIS — I129 Hypertensive chronic kidney disease with stage 1 through stage 4 chronic kidney disease, or unspecified chronic kidney disease: Secondary | ICD-10-CM | POA: Diagnosis not present

## 2015-11-07 DIAGNOSIS — F1423 Cocaine dependence with withdrawal: Secondary | ICD-10-CM | POA: Diagnosis not present

## 2015-11-07 DIAGNOSIS — N189 Chronic kidney disease, unspecified: Secondary | ICD-10-CM | POA: Diagnosis not present

## 2015-11-07 DIAGNOSIS — F329 Major depressive disorder, single episode, unspecified: Secondary | ICD-10-CM | POA: Diagnosis not present

## 2015-11-07 DIAGNOSIS — Z825 Family history of asthma and other chronic lower respiratory diseases: Secondary | ICD-10-CM | POA: Diagnosis not present

## 2015-11-07 DIAGNOSIS — F32A Depression, unspecified: Secondary | ICD-10-CM

## 2015-11-07 DIAGNOSIS — F10230 Alcohol dependence with withdrawal, uncomplicated: Secondary | ICD-10-CM | POA: Diagnosis present

## 2015-11-07 DIAGNOSIS — Z8261 Family history of arthritis: Secondary | ICD-10-CM | POA: Diagnosis not present

## 2015-11-07 DIAGNOSIS — I10 Essential (primary) hypertension: Secondary | ICD-10-CM

## 2015-11-07 DIAGNOSIS — Z808 Family history of malignant neoplasm of other organs or systems: Secondary | ICD-10-CM | POA: Diagnosis not present

## 2015-11-07 LAB — COMPREHENSIVE METABOLIC PANEL
ALK PHOS: 117 U/L (ref 38–126)
ALT: 41 U/L (ref 17–63)
ANION GAP: 8 (ref 5–15)
AST: 66 U/L — ABNORMAL HIGH (ref 15–41)
Albumin: 2.9 g/dL — ABNORMAL LOW (ref 3.5–5.0)
BUN: 15 mg/dL (ref 6–20)
CALCIUM: 9 mg/dL (ref 8.9–10.3)
CHLORIDE: 111 mmol/L (ref 101–111)
CO2: 22 mmol/L (ref 22–32)
Creatinine, Ser: 1.6 mg/dL — ABNORMAL HIGH (ref 0.61–1.24)
GFR calc non Af Amer: 43 mL/min — ABNORMAL LOW (ref >60)
GFR, EST AFRICAN AMERICAN: 49 mL/min — AB (ref >60)
Glucose, Bld: 99 mg/dL (ref 65–99)
POTASSIUM: 4.6 mmol/L (ref 3.5–5.1)
SODIUM: 141 mmol/L (ref 135–145)
Total Bilirubin: 1.1 mg/dL (ref 0.3–1.2)
Total Protein: 7.9 g/dL (ref 6.5–8.1)

## 2015-11-07 LAB — RAPID URINE DRUG SCREEN, HOSP PERFORMED
AMPHETAMINES: NOT DETECTED
BENZODIAZEPINES: NOT DETECTED
Barbiturates: NOT DETECTED
Cocaine: POSITIVE — AB
Opiates: NOT DETECTED
Tetrahydrocannabinol: NOT DETECTED

## 2015-11-07 LAB — CBC
HCT: 41.2 % (ref 39.0–52.0)
HEMOGLOBIN: 14.1 g/dL (ref 13.0–17.0)
MCH: 28.8 pg (ref 26.0–34.0)
MCHC: 34.2 g/dL (ref 30.0–36.0)
MCV: 84.3 fL (ref 78.0–100.0)
PLATELETS: 119 10*3/uL — AB (ref 150–400)
RBC: 4.89 MIL/uL (ref 4.22–5.81)
RDW: 14.7 % (ref 11.5–15.5)
WBC: 7.6 10*3/uL (ref 4.0–10.5)

## 2015-11-07 LAB — ETHANOL: Alcohol, Ethyl (B): <5 mg/dL (ref <5)

## 2015-11-07 MED ORDER — AMLODIPINE BESYLATE 10 MG PO TABS: 10.0000 mg | ORAL_TABLET | Freq: Every day | ORAL | 0 refills | Status: DC

## 2015-11-07 MED ORDER — AMLODIPINE BESYLATE 5 MG PO TABS
10.0000 mg | ORAL_TABLET | Freq: Once | ORAL | Status: AC
  Administered 2015-11-07: 10 mg via ORAL
  Filled 2015-11-07: qty 2

## 2015-11-07 NOTE — ED Triage Notes (Signed)
Patient here requesting detox from daily ETOH use and powder cocaine. Patient reports last use yesterday for both. Denies suicidal ideation. Alert and oriented, no distress, no tremors
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
## 2015-11-07 NOTE — ED Notes (Signed)
Pt was fed with sand which bag

## 2015-11-08 ENCOUNTER — Inpatient Hospital Stay (HOSPITAL_COMMUNITY)
Admission: AD | Admit: 2015-11-08 | Discharge: 2015-11-18 | DRG: 897 | Disposition: A | Discharge status: Home / Self Care | Payer: 59 | Source: Intra-hospital | Attending: Psychiatry | Admitting: Psychiatry

## 2015-11-08 ENCOUNTER — Encounter (HOSPITAL_COMMUNITY): Payer: Self-pay | Admitting: *Deleted

## 2015-11-08 DIAGNOSIS — F329 Major depressive disorder, single episode, unspecified: Secondary | ICD-10-CM | POA: Diagnosis not present

## 2015-11-08 DIAGNOSIS — Z79891 Long term (current) use of opiate analgesic: Secondary | ICD-10-CM | POA: Diagnosis not present

## 2015-11-08 DIAGNOSIS — Z800 Family history of malignant neoplasm of digestive organs: Secondary | ICD-10-CM

## 2015-11-08 DIAGNOSIS — Z23 Encounter for immunization: Secondary | ICD-10-CM

## 2015-11-08 DIAGNOSIS — Z825 Family history of asthma and other chronic lower respiratory diseases: Secondary | ICD-10-CM

## 2015-11-08 DIAGNOSIS — G4700 Insomnia, unspecified: Secondary | ICD-10-CM | POA: Diagnosis present

## 2015-11-08 DIAGNOSIS — F1020 Alcohol dependence, uncomplicated: Principal | ICD-10-CM | POA: Diagnosis present

## 2015-11-08 DIAGNOSIS — Z79899 Other long term (current) drug therapy: Secondary | ICD-10-CM

## 2015-11-08 DIAGNOSIS — F17210 Nicotine dependence, cigarettes, uncomplicated: Secondary | ICD-10-CM | POA: Diagnosis present

## 2015-11-08 DIAGNOSIS — Z808 Family history of malignant neoplasm of other organs or systems: Secondary | ICD-10-CM

## 2015-11-08 DIAGNOSIS — Z8249 Family history of ischemic heart disease and other diseases of the circulatory system: Secondary | ICD-10-CM

## 2015-11-08 DIAGNOSIS — F332 Major depressive disorder, recurrent severe without psychotic features: Secondary | ICD-10-CM | POA: Diagnosis present

## 2015-11-08 DIAGNOSIS — N189 Chronic kidney disease, unspecified: Secondary | ICD-10-CM | POA: Diagnosis present

## 2015-11-08 DIAGNOSIS — F1910 Other psychoactive substance abuse, uncomplicated: Secondary | ICD-10-CM | POA: Diagnosis not present

## 2015-11-08 DIAGNOSIS — I129 Hypertensive chronic kidney disease with stage 1 through stage 4 chronic kidney disease, or unspecified chronic kidney disease: Secondary | ICD-10-CM | POA: Diagnosis present

## 2015-11-08 DIAGNOSIS — Z8489 Family history of other specified conditions: Secondary | ICD-10-CM

## 2015-11-08 DIAGNOSIS — E785 Hyperlipidemia, unspecified: Secondary | ICD-10-CM | POA: Diagnosis present

## 2015-11-08 DIAGNOSIS — F1420 Cocaine dependence, uncomplicated: Secondary | ICD-10-CM | POA: Diagnosis present

## 2015-11-08 DIAGNOSIS — I2510 Atherosclerotic heart disease of native coronary artery without angina pectoris: Secondary | ICD-10-CM | POA: Diagnosis present

## 2015-11-08 DIAGNOSIS — F411 Generalized anxiety disorder: Secondary | ICD-10-CM | POA: Diagnosis present

## 2015-11-08 DIAGNOSIS — Z8261 Family history of arthritis: Secondary | ICD-10-CM

## 2015-11-08 MED ORDER — LORAZEPAM 0.5 MG PO TABS
0.5000 mg | ORAL_TABLET | Freq: Four times a day (QID) | ORAL | Status: DC | PRN
  Administered 2015-11-08: 0.5 mg via ORAL
  Filled 2015-11-08: qty 1

## 2015-11-08 MED ORDER — THIAMINE HCL 100 MG/ML IJ SOLN: 100.0000 mg | Freq: Every day | INTRAMUSCULAR | Status: DC

## 2015-11-08 MED ORDER — LOPERAMIDE HCL 2 MG PO CAPS: 2.0000 mg | ORAL_CAPSULE | ORAL | Status: AC | PRN

## 2015-11-08 MED ORDER — LORAZEPAM 1 MG PO TABS
1.0000 mg | ORAL_TABLET | Freq: Four times a day (QID) | ORAL | Status: AC | PRN
  Administered 2015-11-10: 1 mg via ORAL
  Filled 2015-11-08: qty 1

## 2015-11-08 MED ORDER — MAGNESIUM HYDROXIDE 400 MG/5ML PO SUSP: 30.0000 mL | Freq: Every day | ORAL | Status: DC | PRN

## 2015-11-08 MED ORDER — HYDROXYZINE HCL 25 MG PO TABS: 25.0000 mg | ORAL_TABLET | Freq: Four times a day (QID) | ORAL | Status: AC | PRN

## 2015-11-08 MED ORDER — AMLODIPINE BESYLATE 10 MG PO TABS
10.0000 mg | ORAL_TABLET | Freq: Every day | ORAL | Status: DC
  Administered 2015-11-09 – 2015-11-18 (×10): 10 mg via ORAL
  Filled 2015-11-08 (×12): qty 1

## 2015-11-08 MED ORDER — TRAZODONE HCL 50 MG PO TABS
50.0000 mg | ORAL_TABLET | Freq: Every evening | ORAL | Status: DC | PRN
  Administered 2015-11-12 – 2015-11-17 (×5): 50 mg via ORAL
  Filled 2015-11-08 (×5): qty 1

## 2015-11-08 MED ORDER — VITAMIN B-1 100 MG PO TABS
100.0000 mg | ORAL_TABLET | Freq: Every day | ORAL | Status: DC
  Filled 2015-11-08 (×2): qty 1

## 2015-11-08 MED ORDER — ADULT MULTIVITAMIN W/MINERALS CH
1.0000 | ORAL_TABLET | Freq: Every day | ORAL | Status: DC
  Administered 2015-11-09 – 2015-11-18 (×10): 1 via ORAL
  Filled 2015-11-08 (×12): qty 1

## 2015-11-08 MED ORDER — ALUM & MAG HYDROXIDE-SIMETH 200-200-20 MG/5ML PO SUSP: 30.0000 mL | ORAL | Status: DC | PRN

## 2015-11-08 MED ORDER — LORAZEPAM 1 MG PO TABS
1.0000 mg | ORAL_TABLET | Freq: Two times a day (BID) | ORAL | Status: DC
  Administered 2015-11-11: 1 mg via ORAL
  Filled 2015-11-08: qty 1

## 2015-11-08 MED ORDER — LORAZEPAM 1 MG PO TABS
1.0000 mg | ORAL_TABLET | Freq: Four times a day (QID) | ORAL | Status: AC
  Administered 2015-11-09 (×3): 1 mg via ORAL
  Filled 2015-11-08 (×4): qty 1

## 2015-11-08 MED ORDER — LORAZEPAM 1 MG PO TABS
1.0000 mg | ORAL_TABLET | Freq: Three times a day (TID) | ORAL | Status: AC
  Administered 2015-11-10 (×3): 1 mg via ORAL
  Filled 2015-11-08 (×3): qty 1

## 2015-11-08 MED ORDER — VITAMIN B-1 100 MG PO TABS
100.0000 mg | ORAL_TABLET | Freq: Every day | ORAL | Status: DC
  Administered 2015-11-08: 100 mg via ORAL
  Filled 2015-11-08: qty 1

## 2015-11-08 MED ORDER — VITAMIN B-1 100 MG PO TABS
100.0000 mg | ORAL_TABLET | Freq: Every day | ORAL | Status: DC
  Administered 2015-11-09 – 2015-11-18 (×10): 100 mg via ORAL
  Filled 2015-11-08 (×12): qty 1

## 2015-11-08 MED ORDER — ONDANSETRON 4 MG PO TBDP: 4.0000 mg | ORAL_TABLET | Freq: Four times a day (QID) | ORAL | Status: AC | PRN

## 2015-11-08 MED ORDER — CLONIDINE HCL 0.1 MG PO TABS
0.1000 mg | ORAL_TABLET | Freq: Once | ORAL | Status: AC
  Administered 2015-11-08: 0.1 mg via ORAL
  Filled 2015-11-08: qty 1

## 2015-11-08 MED ORDER — LORAZEPAM 1 MG PO TABS: 1.0000 mg | ORAL_TABLET | Freq: Every day | ORAL | Status: DC

## 2015-11-08 MED ORDER — IBUPROFEN 600 MG PO TABS
600.0000 mg | ORAL_TABLET | Freq: Four times a day (QID) | ORAL | Status: DC | PRN
  Administered 2015-11-08 – 2015-11-17 (×10): 600 mg via ORAL
  Filled 2015-11-08 (×10): qty 1

## 2015-11-08 NOTE — Progress Notes (Signed)
Pt awake and asking for snacks and soda.  Pt denies pain of discomfort except some R knee pain.  Pt sts he had HA eariler and Ibuprofen relieved pain. Pt is high fall risk.   Pt given snacks, and was watched as he ambulated to the bathroom.  Pt given yellow sox and wrist band for fall risk.  Pt BP is 180/77.  Pt given 10 mg Norvasc 1 x only on 10/8 at 2152.   Pt continuously observed for safety on unit except when in bath room.  Pt remains safe and is in bed asleep.

## 2015-11-08 NOTE — ED Notes (Signed)
[place] scrubs provided to patient for psych hold.

## 2015-11-08 NOTE — Progress Notes (Signed)
Admit note:  Patient is a 70 yo male admitted to OBS unit #5 for etoh and cocaine use.  Patient has been using 1.5 to 2 fifths and/or 12 to 24 beers and/or 1-2 bottles of wine daily since age 70.  He has also been snorting cocaine $120-200 worth daily.  Patient's last use of alcohol and cocaine was 11/07/15.  He smokes tobacco 1.5 to 2 packs daily.  Patient has numerous health issues including CKD, cirrhosis, CAD, Hep C, hyperlipidemia, HTN.  He has a hx of treated syphilis.  Patient was negative for alcohol; his UDS was positive for cocaine.  Patient lives with his niece currently. He states the home becomes overcrowded with his niece's children and grandchildren.  He is recently  [facility].  He has been told that he needs to find a PCP before he can be treated again.  Patient started again in 2014.   Patient complains of loss of interest in usual pleasures; feels worthless with self pity; he feels angry and irritable at times.  Patient is followed by [facility], however, he has not kept his appointments in the last month. When asked if patient was having SI, patient stated, "If I go back out on the streets, I'm gonna die.  I will do away with myself."  Patient's belongings in locker # 06 .  Patient is calm with flat, blunted affect; his mood is depressed.  Patient oriented to OBS and given a meal tray.

## 2015-11-08 NOTE — ED Notes (Signed)
telepsych in progress 

## 2015-11-08 NOTE — ED Notes (Signed)
Patient is resting comfortably. Sitter at bedside.  

## 2015-11-08 NOTE — ED Notes (Signed)
[place] at the bedside. Paperwork given along with 2 bags of belongings.

## 2015-11-08 NOTE — Progress Notes (Signed)
Pt is sound asleep at shift change

## 2015-11-08 NOTE — Progress Notes (Signed)
Pt accepted to [facility] observation unit bed 3 by Dr. Dwyane Dee. Report can be called to #[id number]. Pt's bed will be ready at 15:00 today per [facility].  Sharren Bridge, MSW, LCSW Clinical Social Work, Disposition  11/08/2015 [phone]
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
## 2015-11-08 NOTE — ED Notes (Signed)
Pt in the shower 

## 2015-11-08 NOTE — Plan of Care (Signed)
[facility] Observation Crisis Plan  Reason for Crisis Plan:  Substance Abuse   Plan of Care:  Referral for Substance Abuse  Family Support:      Current Living Environment:  Living Arrangements: Other relatives  Insurance:   Hospital Account    Name Acct ID Class Status Primary Coverage   [name], [name] [id number] [place]        Guarantor Account (for Hospital Account [id number])    Name Relation to Pt Service Area Active? Acct Type   Bruce Hayes [facility] Self [facility] Yes [facility]   Address Phone       [address] [place], [place] [address] [phone])          Coverage Information (for Hospital Account [id number])    F/O Payor/Plan Precert #   [employer]    Subscriber Subscriber #   Bruce Hayes, Bruce Hayes [id number] [facility]   Address Phone   [address] [place], [place] [address] [phone]      Legal Guardian:     Primary Care Provider:  Elizabeth Palau, MD (Inactive)  Current Outpatient Providers:  Bruce Hayes  Psychiatrist:     Counselor/Therapist:     Compliant with Medications: No Additional Information:   Bruce Hayes 10/9/20176:10 PM

## 2015-11-08 NOTE — ED Notes (Signed)
Security at bedside to wand patient. Sitter at bedside.

## 2015-11-08 NOTE — ED Notes (Signed)
Belongings inventoried and placed in bin
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
## 2015-11-08 NOTE — ED Notes (Signed)
Patient up eating breakfast. Reports he "still feels out of it; doesn't know if still suicidal but feels a little bit better". Sitter at bedside. No needs.

## 2015-11-09 DIAGNOSIS — Z888 Allergy status to other drugs, medicaments and biological substances status: Secondary | ICD-10-CM

## 2015-11-09 DIAGNOSIS — F1420 Cocaine dependence, uncomplicated: Secondary | ICD-10-CM | POA: Diagnosis present

## 2015-11-09 DIAGNOSIS — Z800 Family history of malignant neoplasm of digestive organs: Secondary | ICD-10-CM | POA: Diagnosis not present

## 2015-11-09 DIAGNOSIS — Z79899 Other long term (current) drug therapy: Secondary | ICD-10-CM | POA: Diagnosis not present

## 2015-11-09 DIAGNOSIS — I2510 Atherosclerotic heart disease of native coronary artery without angina pectoris: Secondary | ICD-10-CM | POA: Diagnosis present

## 2015-11-09 DIAGNOSIS — Z8261 Family history of arthritis: Secondary | ICD-10-CM

## 2015-11-09 DIAGNOSIS — F332 Major depressive disorder, recurrent severe without psychotic features: Secondary | ICD-10-CM | POA: Diagnosis present

## 2015-11-09 DIAGNOSIS — Z825 Family history of asthma and other chronic lower respiratory diseases: Secondary | ICD-10-CM

## 2015-11-09 DIAGNOSIS — Z808 Family history of malignant neoplasm of other organs or systems: Secondary | ICD-10-CM

## 2015-11-09 DIAGNOSIS — F1020 Alcohol dependence, uncomplicated: Secondary | ICD-10-CM | POA: Diagnosis present

## 2015-11-09 DIAGNOSIS — G4700 Insomnia, unspecified: Secondary | ICD-10-CM | POA: Diagnosis present

## 2015-11-09 DIAGNOSIS — Z8249 Family history of ischemic heart disease and other diseases of the circulatory system: Secondary | ICD-10-CM | POA: Diagnosis not present

## 2015-11-09 DIAGNOSIS — F411 Generalized anxiety disorder: Secondary | ICD-10-CM | POA: Diagnosis present

## 2015-11-09 DIAGNOSIS — N189 Chronic kidney disease, unspecified: Secondary | ICD-10-CM | POA: Diagnosis present

## 2015-11-09 DIAGNOSIS — F17210 Nicotine dependence, cigarettes, uncomplicated: Secondary | ICD-10-CM | POA: Diagnosis present

## 2015-11-09 DIAGNOSIS — E785 Hyperlipidemia, unspecified: Secondary | ICD-10-CM | POA: Diagnosis present

## 2015-11-09 DIAGNOSIS — Z23 Encounter for immunization: Secondary | ICD-10-CM | POA: Diagnosis not present

## 2015-11-09 DIAGNOSIS — I129 Hypertensive chronic kidney disease with stage 1 through stage 4 chronic kidney disease, or unspecified chronic kidney disease: Secondary | ICD-10-CM | POA: Diagnosis present

## 2015-11-09 NOTE — Progress Notes (Signed)
D: Patient continues to "feel tired."  Patient does not feel well today and his BP continues to be elevated.  Notified MD of same.  Manually his BP continues to be in the 180's.  Patient has lain in bed majority of morning.  He isolates to his room and has minimal interaction with staff and peers.  He has passive SI with no specific plan.  Patient had stated upon admission, "if I go back out of the streets, I would do something."  Patient has flat, blunted affect; his mood is depressed.  Patient did not fill out a self inventory today. A: Continue to monitor medication management and MD orders.  Safety checks completed every 15 minutes per protocol.  Offer support and encouragement as needed. R: Patient isolative and withdrawn.

## 2015-11-09 NOTE — Progress Notes (Signed)
Patient did not attend AA group meeting tonight.
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
## 2015-11-09 NOTE — Progress Notes (Signed)
  D: Pt observed sleeping in bed with eyes closed. RR even and unlabored. No distress noted  .  A: Q 15 minute checks were done for safety.  R: safety maintained on unit.  

## 2015-11-09 NOTE — BHH Group Notes (Signed)
[place] Group Notes:  (Nursing/MHT/Case Management/Adjunct)  Date:  11/09/2015  Time:  9:52 AM  Type of Therapy:  Psychoeducational Skills  Participation Level:  Did Not Attend  Participation Quality:  DID NOT ATTEND  Affect:  DID NOT ATTEND  Cognitive:  DID NOT ATTEND  Insight:  None  Engagement in Group:  DID NOT ATTEND  Modes of Intervention:  DID NOT ATTEND  Summary of Progress/Problems: Pt did not attend patient self inventory group.    Bruce Hayes 11/09/2015, 9:52 AM

## 2015-11-09 NOTE — BHH Suicide Risk Assessment (Signed)
[facility] Admission Suicide Risk Assessment   Nursing information obtained from:   pt  Demographic factors:   male over 24 Current Mental Status:   see MSE Loss Factors:   housing, education Historical Factors:   substance use medical problems Risk Reduction Factors: social support financial resourses    Total Time spent with patient: 30 minutes Principal Problem: Alcohol use disorder, severe, dependence ([place]) Diagnosis:   Patient Active Problem List   Diagnosis Date Noted  . Alcohol use disorder, severe, dependence ([place]) [F10.20] 11/08/2015  . Alcohol abuse [F10.10] 12/07/2013  . Cocaine abuse [F14.10] 12/07/2013  . Polysubstance abuse [F19.10] 12/07/2013  . Alcohol use disorder, moderate, dependence ([place]) [F10.20] 12/07/2013  . Depression [F32.9]   . Right radial head fracture [S52.121A] 11/30/2011  . Acute renal failure ([place]) [N17.9] 11/28/2011  . Hyperkalemia [E87.5] 11/28/2011  . CELLULITIS AND ABSCESS OF UNSPECIFIED SITE [L03.90, L02.91] 12/21/2008  . HEEL PAIN, LEFT [M79.609] 12/21/2008  . ESOPHAGEAL VARICES [I85.00] 06/02/2008  . ABNORMAL ALPHA-FETOPROTEIN [R79.9] 09/12/2007  . RENAL INSUFFICIENCY, ACUTE [N18.9] 09/06/2007  . HEPATITIS B CARRIER [B18.1] 07/16/2007  . UNSPECIFIED DISORDER OF LIVER [K76.9] 07/03/2007  . HEPATITIS C [B17.10] 05/22/2007  . HYPERLIPIDEMIA [E78.5] 05/22/2007  . OBESITY [E66.9] 05/22/2007  . THROMBOCYTOPENIA [D69.6] 05/22/2007  . TOBACCO ABUSE [F17.200] 05/22/2007  . HYPERTENSION [I10] 05/22/2007  . COCAINE ABUSE, HX OF [Z91.89] 05/22/2007   Subjective Data: Patient with long-standing alcohol use disorder, severe denies any acute suicidal or homicidal ideation, plan or intent and denies any psychotic symptoms at present.  Continued Clinical Symptoms:  Alcohol Use Disorder Identification Test Final Score (AUDIT): 36 The "Alcohol Use Disorders Identification Test", Guidelines for Use in Primary Care, Second Edition.  World Pharmacologist  [facility]). Score between 0-7:  no or low risk or alcohol related problems. Score between 8-15:  moderate risk of alcohol related problems. Score between 16-19:  high risk of alcohol related problems. Score 20 or above:  warrants further diagnostic evaluation for alcohol dependence and treatment.   CLINICAL FACTORS:   Alcohol/Substance Abuse/Dependencies   Musculoskeletal: Strength & Muscle Tone: within normal limits Gait & Station: unsteady, broad based Patient leans: Backward and N/A  Psychiatric Specialty Exam: Physical Exam  ROS  Blood pressure (!) 152/76, pulse 82, temperature 97.7 F (36.5 C), temperature source Oral, resp. rate 18, height 5\' 11"  (1.803 m), weight 97.5 kg (215 lb), SpO2 100 %.Body mass index is 29.99 kg/m.  General Appearance: Casual  Eye Contact:  Good  Speech:  Clear and Coherent  Volume:  Normal  Mood:  Irritable  Affect:  Congruent  Thought Process:  Coherent  Orientation:  Full (Time, Place, and Person)  Thought Content:  Negative  Suicidal Thoughts:  No  Homicidal Thoughts:  No  Memory:  Negative  Judgement:  Fair  Insight:  Fair  Psychomotor Activity:  Wide Base  Concentration:  Concentration: Fair and Attention Span: Fair  Recall:  Good  Fund of Knowledge:  Good  Language:  Good  Akathisia:  No  Handed:  Right  AIMS (if indicated):     Assets:  Financial Resources/Insurance  ADL's:  Intact  Cognition:  WNL  Sleep:  Number of Hours: 4.5      COGNITIVE FEATURES THAT CONTRIBUTE TO RISK:  None    SUICIDE RISK:   Mild:  Suicidal ideation of limited frequency, intensity, duration, and specificity.  There are no identifiable plans, no associated intent, mild dysphoria and related symptoms, good self-control (both objective and subjective assessment),  few other risk factors, and identifiable protective factors, including available and accessible social support.   PLAN OF CARE: see PAA  I certify that inpatient services furnished can  reasonably be expected to improve the patient's condition.  [name], MD 11/09/2015, 12:55 PM

## 2015-11-09 NOTE — Progress Notes (Signed)
D   Pt has been in the bed most of the shift   He did not attend group and has minimal interaction with others    He did come to the dayroom for a snack and is sitting there watching TV   Pt has no complaints of withdrawl A    Verbal support given   Medications administered and effectiveness monitored    Q 15 min checks R    Pt is safe at present

## 2015-11-09 NOTE — Tx Team (Signed)
Interdisciplinary Treatment and Diagnostic Plan Update  11/09/2015 Time of Session: 1:31 PM  Bruce Hayes MRN: [id number]  Principal Diagnosis: Alcohol use disorder, severe, dependence (Bruce Hayes)  Secondary Diagnoses: Principal Problem:   Alcohol use disorder, severe, dependence (Bruce Hayes)   Current Medications:  Current Facility-Administered Medications  Medication Dose Route Frequency Provider Last Rate Last Dose  . alum & mag hydroxide-simeth (MAALOX/MYLANTA) 200-200-20 MG/5ML suspension 30 mL  30 mL Oral Q4H PRN Ethelene Hal, NP      . amLODipine (NORVASC) tablet 10 mg  10 mg Oral Daily Ethelene Hal, NP   10 mg at 11/09/15 4268  . hydrOXYzine (ATARAX/VISTARIL) tablet 25 mg  25 mg Oral Q6H PRN [name], PA-C      . ibuprofen (ADVIL,MOTRIN) tablet 600 mg  600 mg Oral Q6H PRN Niel Hummer, NP   600 mg at 11/08/15 1814  . loperamide (IMODIUM) capsule 2-4 mg  2-4 mg Oral PRN [name], PA-C      . LORazepam (ATIVAN) tablet 1 mg  1 mg Oral Q6H PRN [name], PA-C      . LORazepam (ATIVAN) tablet 1 mg  1 mg Oral QID [name], PA-C   1 mg at 11/09/15 1255   Followed by  . [START ON 11/10/2015] LORazepam (ATIVAN) tablet 1 mg  1 mg Oral TID [name], PA-C       Followed by  . [START ON 11/11/2015] LORazepam (ATIVAN) tablet 1 mg  1 mg Oral BID [name], PA-C       Followed by  . [START ON 11/12/2015] LORazepam (ATIVAN) tablet 1 mg  1 mg Oral Daily Spencer E Simon, PA-C      . magnesium hydroxide (MILK OF MAGNESIA) suspension 30 mL  30 mL Oral Daily PRN Ethelene Hal, NP      . multivitamin with minerals tablet 1 tablet  1 tablet Oral Daily [name], PA-C   1 tablet at 11/09/15 3419  . ondansetron (ZOFRAN-ODT) disintegrating tablet 4 mg  4 mg Oral Q6H PRN [name], PA-C      . thiamine (VITAMIN B-1) tablet 100 mg  100 mg Oral Daily Ethelene Hal, NP   100 mg at 11/09/15 0811  . traZODone (DESYREL) tablet 50 mg  50  mg Oral QHS PRN Ethelene Hal, NP        PTA Medications: Prescriptions Prior to Admission  Medication Sig Dispense Refill Last Dose  . amLODipine (NORVASC) 10 MG tablet Take 1 tablet (10 mg total) by mouth daily. 30 tablet 0   . aspirin EC 325 MG tablet Take 1 tablet (325 mg total) by mouth daily. (Patient not taking: Reported on 11/07/2015) 30 tablet 0 Not Taking at Unknown time  . atorvastatin (LIPITOR) 10 MG tablet Take 1 tablet (10 mg total) by mouth daily. For increased cholesterol (Patient not taking: Reported on 11/07/2015) 30 tablet 3 Not Taking at Unknown time  . cephALEXin (KEFLEX) 500 MG capsule Take 1 capsule (500 mg total) by mouth 4 (four) times daily. (Patient not taking: Reported on 11/07/2015) 28 capsule 0 Not Taking at Unknown time  . chlorthalidone (HYGROTON) 25 MG tablet Take 1 tablet (25 mg total) by mouth daily. (Patient not taking: Reported on 11/07/2015) 14 tablet 0 Not Taking at Unknown time  . citalopram (CELEXA) 20 MG tablet Take 1 tablet (20 mg total) by mouth daily. (Patient not taking: Reported on 11/07/2015) 30 tablet  0 Not Taking at Unknown time  . citalopram (CELEXA) 20 MG tablet Take 1 tablet (20 mg total) by mouth daily. (Patient not taking: Reported on 11/07/2015) 14 tablet 0 Not Taking at Unknown time  . hydrALAZINE (APRESOLINE) 25 MG tablet Take 1 tablet (25 mg total) by mouth daily. (Patient not taking: Reported on 11/07/2015)   Not Taking at Unknown time  . hydrALAZINE (APRESOLINE) 25 MG tablet Take 1 tablet (25 mg total) by mouth 3 (three) times daily. (Patient not taking: Reported on 11/07/2015) 42 tablet 0 Not Taking at Unknown time  . ibuprofen (ADVIL,MOTRIN) 800 MG tablet Take 1 tablet (800 mg total) by mouth every 8 (eight) hours as needed for moderate pain. (Patient not taking: Reported on 11/07/2015) 30 tablet 0 Not Taking at Unknown time  . Misc. Devices (CANE) MISC Use to help with ambulation. (Patient not taking: Reported on 11/07/2015) 1 each 0  unknown at Unknown time  . naproxen (NAPROSYN) 500 MG tablet Take 1 tablet (500 mg total) by mouth 2 (two) times daily with a meal. (Patient not taking: Reported on 11/07/2015) 30 tablet 0 Not Taking at Unknown time  . oxyCODONE (ROXICODONE) 5 MG immediate release tablet Take 1 tablet (5 mg total) by mouth every 6 (six) hours as needed for severe pain. (Patient not taking: Reported on 11/07/2015) 20 tablet 0 Not Taking at Unknown time  . predniSONE (DELTASONE) 20 MG tablet Take 2 tablets (40 mg total) by mouth daily. Take 40 mg by mouth daily for 3 days, then 20mg  by mouth daily for 3 days, then 10mg  daily for 3 days (Patient not taking: Reported on 11/07/2015) 12 tablet 0 Not Taking at Unknown time  . traMADol (ULTRAM) 50 MG tablet Take 1 tablet (50 mg total) by mouth every 6 (six) hours as needed. (Patient not taking: Reported on 11/07/2015) 10 tablet 0 Not Taking at Unknown time  . traMADol (ULTRAM) 50 MG tablet Take 1 tablet (50 mg total) by mouth every 6 (six) hours as needed. (Patient not taking: Reported on 11/07/2015) 8 tablet 0 Not Taking at Unknown time    Treatment Modalities: Medication Management, Group therapy, Case management,  1 to 1 session with clinician, Psychoeducation, Recreational therapy.   Physician Treatment Plan for Primary Diagnosis: Alcohol use disorder, severe, dependence (Bruce Hayes) Long Term Goal(s): Improvement in symptoms so as ready for discharge  Short Term Goals: Ability to maintain clinical measurements within normal limits will improve and Compliance with prescribed medications will improve  Medication Management: Evaluate patient's response, side effects, and tolerance of medication regimen.  Therapeutic Interventions: 1 to 1 sessions, Unit Group sessions and Medication administration.  Evaluation of Outcomes: Progressing  Physician Treatment Plan for Secondary Diagnosis: Principal Problem:   Alcohol use disorder, severe, dependence (Bruce Hayes)   Long Term Goal(s):  Improvement in symptoms so as ready for discharge  Short Term Goals: Ability to identify changes in lifestyle to reduce recurrence of condition will improve, Ability to demonstrate self-control will improve and Ability to identify triggers associated with substance abuse/mental health issues will improve  Medication Management: Evaluate patient's response, side effects, and tolerance of medication regimen.  Therapeutic Interventions: 1 to 1 sessions, Unit Group sessions and Medication administration.  Evaluation of Outcomes: Progressing   RN Treatment Plan for Primary Diagnosis: Alcohol use disorder, severe, dependence ([place]) Long Term Goal(s): Knowledge of disease and therapeutic regimen to maintain health will improve  Short Term Goals: Ability to remain free from injury will improve and Ability to demonstrate  self-control  Medication Management: RN will administer medications as ordered by provider, will assess and evaluate patient's response and provide education to patient for prescribed medication. RN will report any adverse and/or side effects to prescribing provider.  Therapeutic Interventions: 1 on 1 counseling sessions, Psychoeducation, Medication administration, Evaluate responses to treatment, Monitor vital signs and CBGs as ordered, Perform/monitor CIWA, COWS, AIMS and Fall Risk screenings as ordered, Perform wound care treatments as ordered.  Evaluation of Outcomes: Progressing   LCSW Treatment Plan for Primary Diagnosis: Alcohol use disorder, severe, dependence ([place]) Long Term Goal(s): Safe transition to appropriate next level of care at discharge, Engage patient in therapeutic group addressing interpersonal concerns.  Short Term Goals: Engage patient in aftercare planning with referrals and resources and Increase social support  Therapeutic Interventions: Assess for all discharge needs, 1 to 1 time with Social worker, Explore available resources and support systems, Assess  for adequacy in community support network, Educate family and significant other(s) on suicide prevention, Complete Psychosocial Assessment, Interpersonal group therapy.  Evaluation of Outcomes: Progressing   Progress in Treatment: Attending groups: Yes Participating in groups: Yes Taking medication as prescribed: Yes, MD continues to assess for medication changes as needed Toleration medication: Yes, no side effects reported at this time Family/Significant other contact made:  Patient understands diagnosis:  Discussing patient identified problems/goals with staff: Yes Medical problems stabilized or resolved: Yes Denies suicidal/homicidal ideation:  Issues/concerns per patient self-inventory: None Other: N/A  New problem(s) identified: None identified at this time.   New Short Term/Long Term Goal(s): None identified at this time.   Discharge Plan or Barriers: LCSW is actively working on after care plan for patient  Reason for Continuation of Hospitalization:   Depression  Medical Issues Medication stabilization Suicidal ideation Withdrawal symptoms  Estimated Length of Stay: 3-5 days  Attendees: Patient: Bruce Hayes 11/09/2015  1:31 PM  Physician: Army Chaco, MD 11/09/2015  1:31 PM  Nursing: Gari Crown, RN 11/09/2015  1:31 PM  RN Care Manager: Anderson Malta, [place] 11/09/2015  1:31 PM  Social Worker: Betti Cruz  LCSW 11/09/2015  1:31 PM  Recreational Therapist: Langley Gauss, [place] 11/09/2015  1:31 PM  Other:  11/09/2015  1:31 PM  Other:  11/09/2015  1:31 PM  Other: 11/09/2015  1:31 PM    Scribe for Treatment Team: Lilly Cove, LCSW

## 2015-11-09 NOTE — Progress Notes (Signed)
Recreation Therapy Notes  Animal-Assisted Activity (AAA) Program Checklist/Progress Notes Patient Eligibility Criteria Checklist & Daily Group note for Rec TxIntervention  Date: 10.10.2017 Time: 3:00pm Location: [address]    AAA/T Program Assumption of Risk Form signed by Patient/ or Parent Legal Guardian Yes  Patient is free of allergies or sever asthma Yes  Patient reports no fear of animals Yes  Patient reports no history of cruelty to animals Yes  Patient understands his/her participation is voluntary Yes  Patient washes hands before animal contact Yes  Patient washes hands after animal contact Yes  Behavioral Response: Engaged, Appropriate   Education:Hand Washing, Appropriate Animal Interaction   Education Outcome: Acknowledges education.   Clinical Observations/Feedback: Patient declined services at this time. Decline noted on patient consent form in paper chart.    Bruce Hayes Bruce Hayes, LRT/CTRS         Bruce Hayes L 11/09/2015 3:04 PM

## 2015-11-09 NOTE — Progress Notes (Signed)
Bruce Hayes is a 70 year old male being admitted voluntarily to [facility] from the OBS unit.  Alcohol and opiate detox as well as he has been using cocaine.  He came to [facility] on 11/08/15 asking for help with his addiction. He stated "I have been using alcohol and cocaine every day for the past 9 months and just need help to get my mind straight." He reported history of residential treatment at [facility] in the past.  He reported passive suicidal ideations without a plan and has never attempted suicide in the past. He denies auditory and visual. He has a history of hypertension and had not taken BP meds in 4 months. He is being transferred to IP unit due to hypertension and withdrawal symptoms.  He has a history of HTN.  He is diagnosed with MDD, Recurrent, Severe; Alcohol Use D/O, Severe; Cocaine Use D/O.  He currently denies SI/HI or A/V hallucinations.  He stated that "all I want to do is go to the bathroom and go to bed."  Oriented him to the unit.  Admission paperwork completed and signed.  Belongings searched and secured in locker #  6. Q 15 minute checks initiated for safety.  We will monitor the progress towards his goals.

## 2015-11-09 NOTE — BHH Group Notes (Signed)
Type of Therapy: Mental Health Association Presentation  Participation Level: Active  Participation Quality: Attentive  Affect: Appropriate  Cognitive: Oriented  Insight: Developing/Improving  Engagement in Therapy: Engaged  Modes of Intervention: Discussion, Education and Socialization  Summary of Progress/Problems: Mental Health Association ([place]) Speaker came to talk about his personal journey with substance abuse and addiction. The pt processed ways by which to relate to the speaker. [place] speaker provided handouts and educational information pertaining to groups and services offered by the [facility]. Pt was engaged in speaker's presentation and was receptive to resources provided.  Lane Hacker, MSW Clinical Social Work: Printmaker

## 2015-11-09 NOTE — Plan of Care (Signed)
Problem: Activity: Goal: Interest or engagement in activities will improve Outcome: Not Progressing Patient encouraged to attend group.  He states he is not feeling well this am.

## 2015-11-09 NOTE — Tx Team (Signed)
Initial Treatment Plan 11/09/2015 1:18 AM Bruce Hayes UYW:[id number]    PATIENT STRESSORS: Financial difficulties Medication change or noncompliance Substance abuse   PATIENT STRENGTHS: Curator fund of knowledge   PATIENT IDENTIFIED PROBLEMS: Depression  Anxiety  Substance abuse  Suicidal ideation  "Get clean"  "feel better"           DISCHARGE CRITERIA:  Improved stabilization in mood, thinking, and/or behavior Verbal commitment to aftercare and medication compliance Withdrawal symptoms are absent or subacute and managed without 24-hour nursing intervention  PRELIMINARY DISCHARGE PLAN: Outpatient therapy Medication management  PATIENT/FAMILY INVOLVEMENT: This treatment plan has been presented to and reviewed with the patient, [name].  The patient and family have been given the opportunity to ask questions and make suggestions.  Windell Moment, RN 11/09/2015, 1:18 AM

## 2015-11-10 MED ORDER — HYDRALAZINE HCL 25 MG PO TABS
25.0000 mg | ORAL_TABLET | Freq: Every day | ORAL | Status: DC
  Administered 2015-11-10 – 2015-11-14 (×5): 25 mg via ORAL
  Filled 2015-11-10 (×7): qty 1

## 2015-11-10 MED ORDER — PNEUMOCOCCAL VAC POLYVALENT 25 MCG/0.5ML IJ INJ: 0.5000 mL | INJECTION | INTRAMUSCULAR | Status: DC

## 2015-11-10 MED ORDER — INFLUENZA VAC SPLIT QUAD 0.5 ML IM SUSY
0.5000 mL | PREFILLED_SYRINGE | INTRAMUSCULAR | Status: DC
  Filled 2015-11-10: qty 0.5

## 2015-11-10 NOTE — Plan of Care (Signed)
Problem: Activity: Goal: Sleeping patterns will improve Outcome: Not Progressing Patient is sleeping majority of the day which may interfere with his nighttime sleep.

## 2015-11-10 NOTE — BHH Suicide Risk Assessment (Signed)
[place] INPATIENT:  Family/Significant Other Suicide Prevention Education  Suicide Prevention Education:  Patient Refusal for Family/Significant Other Suicide Prevention Education: The patient Bruce Hayes has refused to provide written consent for family/significant other to be provided Family/Significant Other Suicide Prevention Education during admission and/or prior to discharge.  Physician notified.  Lilly Cove 11/10/2015, 1:13 PM

## 2015-11-10 NOTE — Progress Notes (Signed)
[facility] Group Notes:  (Nursing/MHT/Case Management/Adjunct)  Date:  11/10/2015  Time:  11:02 AM  Type of Therapy:  Nurse Education  Participation Level:  Active  Participation Quality:  Appropriate  Affect:  Appropriate  Cognitive:  Alert and Oriented  Insight:  Appropriate  Engagement in Group:  Developing/Improving  Modes of Intervention:  Activity, Discussion, Education, Socialization and Support  Summary of Progress/Problems:The pupose of this group is to educate and introduce patients to the benefits of aromatherapy. Assessment completed prior to group. Discussed aromatherapy treatment to enhance and support patient's treatment goals. Pt requested lavender for sleep.  Bruce Hayes 11/10/2015, 11:02 AM

## 2015-11-10 NOTE — BHH Counselor (Signed)
Adult Comprehensive Assessment  Patient ID: Bruce Hayes, male   DOB: 11/01/1945, 70 y.o.   MRN: [id number]  Information Source:  Patient     Current Stressors:  Educational / Learning stressors: did not finish he GED program Employment / Job issues: no current employment Family Relationships: limited to no positive supports Housing / Lack of housing: reports he was approved for Section 8 housing, however did not know and lost his apartment Substance abuse: cocaine and alochol abuse  Living/Environment/Situation:  Living Arrangements: Non-relatives/Friends Living conditions (as described by patient or guardian): Patient has been living with his neice/ friends How long has patient lived in current situation?: few months What is atmosphere in current home: Temporary  Family History:  Marital status: Single Does patient have children?: Yes How many children?: 1 How is patient's relationship with their children?: No contact with adult daughter and 15 YO grandson in several years  Childhood History:  By whom was/is the patient raised?: Both parents Additional childhood history information: "I had a fine family" Description of patient's relationship with caregiver when they were a child: Good with both Did patient suffer any verbal/emotional/physical/sexual abuse as a child?: No Did patient suffer from severe childhood neglect?: No Has patient ever been sexually abused/assaulted/raped as an adolescent or adult?: No Was the patient ever a victim of a crime or a disaster?: No Witnessed domestic violence?: No Has patient been effected by domestic violence as an adult?: No  Education:  Highest grade of school patient has completed: Patient did not finish high school, he has completed some of his GED, just not finished Currently a Ship broker?: No Learning disability?: No  Employment/Work Situation:   Employment situation: Unemployed Patient's job has been impacted by current illness:  No What is the longest time patient has a held a job?: 10 years; Geophysicist/field seismologist for [employer] Where was the patient employed at that time?: [employer] Has patient ever been in the [employer]?: Yes (Describe in comment) Has patient ever served in combat?: No  Financial Resources:   Museum/gallery curator resources: Armed forces training and education officer, Multimedia programmer Does patient have a Programmer, applications or guardian?: No  Alcohol/Substance Abuse:   What has been your use of drugs/alcohol within the last 12 months?: cocaine and alochol (last 9 months) If attempted suicide, did drugs/alcohol play a role in this?: No Alcohol/Substance Abuse Treatment Hx: Past Tx, Inpatient If yes, describe treatment: [facility] Has alcohol/substance abuse ever caused legal problems?: No  Social Support System:   Heritage manager System: Poor Describe Community Support System: reports all his [place] friends drink and drug thus lack positive infulence, no contact with family  Leisure/Recreation:   Leisure and Hobbies: Chess  Strengths/Needs:   What things does the patient do well?: declined In what areas does patient struggle / problems for patient: addiction  Discharge Plan:   Does patient have access to transportation?: Yes Plan for living situation after discharge: unknown at this time, would like referral for treatment Currently receiving community mental health services: No If no, would patient like referral for services when discharged?: Yes (What county[place]) Does patient have financial barriers related to discharge medications?: No  Summary/Recommendations:   Summary and Recommendations (to be completed by the evaluator): Patient relates a long-standing history of alcohol use disorder. He relates tolerance, withdrawal, attempts to cut down, and used despite consequences such as he was approved for section 8 disabled housing in June but did not know about it until August and he also  had to drop out  of [place] where he was going for his GED earlier this year. He also reports using cocaine. He states "I'm going to stop" and "I need at least a 30 day facility." He has been to several residential rehabilitation programs in the past and reports he had some success in 2012 and 2013 after he accelerated his drinking at that time due to depression over the death of his parents.  Lilly Cove 11/10/2015

## 2015-11-10 NOTE — Progress Notes (Signed)
Pt assessed and given a cotton ball with lavender to support sleep tonight.

## 2015-11-10 NOTE — Progress Notes (Addendum)
Patient was up in the dayroom for the first time today.  LCSW sat and discussed options for aftercare for patient. Patient reports he cannot return to his nieces home due to the amount of people in the home. He questions about a nursing home and assisted living.  LCSW reports he would not qualify for nursing home, but may for ALF. When discussed about giving up his check for medicaid, he refuses.  He reports he wants housing, had section 8, but did not know he was approved because letter went to his sister's home and he was binge drinking and never followed up.   He is open to referrals for [facility] and ready 4 change.   LCSW will complete referrals for aftercare planning.    LCSW has attempted to speak with patient after lunch regarding his aftercare plan. Patient will not wake up, engage, and is irritable.   LCSW is available to assist patient with aftercare plans, but at this time he is refusing to converse and provide input.  Lane Hacker, MSW Clinical Social Work: Printmaker

## 2015-11-10 NOTE — Progress Notes (Addendum)
D: Patient has been isolative to room today.  He hasn't spoken to staff, except to get his scheduled medications.  He has not attended any groups.  He has minimal to no interaction with staff and peers.  Patient is not sure what he needs at discharge.  Patient's BP continues to be elevated; received order for hydralozine 25 mg from NP.  He is vague about thoughts of self harm.  He continues to express that if he goes "out on the streets" he would harm himself somehow with no specific plan.  He denies AVH/HI.  He exhibits flat, blunted affect with depressed mood.   A: Continue to monitor medication management and MD orders.  Safety checks continued every 15 minutes per protocol.  Offer support and encouragement as needed. R: Patient remains isolative and withdrawn.

## 2015-11-10 NOTE — Progress Notes (Signed)
D   Pt is irritable and is not attending groups this evening   He came to the nurses station saying he woke up and his finger was bleeding   He said he did not remember hitting anything and doesn't know how it got there   Area is on his left thumb and it is a cut about 1/2 inch long  It does not appear deep but is bleeding quite a bit    Pt requested to take a shower before the area was treated   Pt denies withdrawal symptoms  A    Verbal support given   Medications administered and effectiveness monitored   Q 15 min checks R    Pt safe at present will continue to observe cut on thumb

## 2015-11-10 NOTE — Progress Notes (Signed)
Patient did not attend NA group meeting tonight.

## 2015-11-10 NOTE — Progress Notes (Signed)
[facility] MD Progress Note  11/10/2015 9:06 AM  Patient Active Problem List   Diagnosis Date Noted  . Alcohol use disorder, severe, dependence ([place]) 11/08/2015  . Alcohol abuse 12/07/2013  . Cocaine abuse 12/07/2013  . Polysubstance abuse 12/07/2013  . Alcohol use disorder, moderate, dependence ([place]) 12/07/2013  . Depression   . Right radial head fracture 11/30/2011  . Acute renal failure ([place]) 11/28/2011  . Hyperkalemia 11/28/2011  . CELLULITIS AND ABSCESS OF UNSPECIFIED SITE 12/21/2008  . HEEL PAIN, LEFT 12/21/2008  . 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 RENAL INSUFFICIENCY, ACUTE 09/06/2007  . HEPATITIS B CARRIER 07/16/2007  . UNSPECIFIED DISORDER OF LIVER 07/03/2007  . HEPATITIS C 05/22/2007  . HYPERLIPIDEMIA 05/22/2007  . OBESITY 05/22/2007  . THROMBOCYTOPENIA 05/22/2007  . TOBACCO ABUSE 05/22/2007  . HYPERTENSION 05/22/2007  . COCAINE ABUSE, HX OF 05/22/2007    Diagnosis: Substance use disorders  Subjective: Patient denies any acute suicidal or homicidal ideation plan or intent today. He denies any medication side effects. He denies any acute concerns.  Objective: Well developed well nourished man lying comfortably in bed somewhat somnolent but responsive and pleasant. He denies any suicidal or homicidal ideation, plan or intent speech and motor appear within normal limits with no signs of any withdrawal. Insight and judgment appear fair IQ appears an average range     Current Facility-Administered Medications (Cardiovascular):  .  amLODipine (NORVASC) tablet 10 mg     Current Facility-Administered Medications (Analgesics):  .  ibuprofen (ADVIL,MOTRIN) tablet 600 mg     Current Facility-Administered Medications (Other):  .  alum & mag hydroxide-simeth (MAALOX/MYLANTA) 200-200-20 MG/5ML suspension 30 mL .  hydrOXYzine (ATARAX/VISTARIL) tablet 25 mg .  loperamide (IMODIUM) capsule 2-4 mg .  LORazepam (ATIVAN) tablet 1 mg .   [EXPIRED] LORazepam (ATIVAN) tablet 1 mg **FOLLOWED BY** LORazepam (ATIVAN) tablet 1 mg **FOLLOWED BY** [START ON 11/11/2015] LORazepam (ATIVAN) tablet 1 mg **FOLLOWED BY** [START ON 11/12/2015] LORazepam (ATIVAN) tablet 1 mg .  magnesium hydroxide (MILK OF MAGNESIA) suspension 30 mL .  multivitamin with minerals tablet 1 tablet .  ondansetron (ZOFRAN-ODT) disintegrating tablet 4 mg .  thiamine (VITAMIN B-1) tablet 100 mg .  traZODone (DESYREL) tablet 50 mg  No current outpatient prescriptions on file.  Vital Signs:Blood pressure (!) 165/79, pulse 91, temperature 97.7 F (36.5 C), temperature source Oral, resp. rate 18, height 5\' 11"  (1.803 m), weight 97.5 kg (215 lb), SpO2 100 %.    Lab Results: No results found for this or any previous visit (from the past 48 hour(s)).  Physical Findings: AIMS: Facial and Oral Movements Muscles of Facial Expression: None, normal Lips and Perioral Area: None, normal Jaw: None, normal Tongue: None, normal,Extremity Movements Upper (arms, wrists, hands, fingers): None, normal Lower (legs, knees, ankles, toes): None, normal, Trunk Movements Neck, shoulders, hips: None, normal, Overall Severity Severity of abnormal movements (highest score from questions above): None, normal Incapacitation due to abnormal movements: None, normal Patient's awareness of abnormal movements (rate only patient's report): No Awareness, Dental Status Current problems with teeth and/or dentures?: No Does patient usually wear dentures?: No  CIWA:  CIWA-Ar Total: 4 COWS:      Assessment/Plan: Patient appears to be tolerating any withdrawal symptoms well without complications. He will be working with social work to clarify and expand on any long-term resources needed for outpatient substance use as well.  [name], MD 11/10/2015, 9:06 AM

## 2015-11-10 NOTE — Progress Notes (Signed)
Recreation Therapy Notes  Date: 11/10/15 Time: 0930 Location: [address] Dayroom  Group Topic: Stress Management  Goal Area(s) Addresses:  Patient will verbalize importance of using healthy stress management.  Patient will identify positive emotions associated with healthy stress management.   Intervention: Stress Management  Activity :  Progressive Muscle Relaxation.  LRT introduced the technique of progressive muscle relaxation.  LRT explained that the technique allows patients to relax muscle groups one at a time.  LRT read a script to guide the patients through the technique.  Pt were to follow along as LRT read script to engage in technique.  Education:  Stress Management, Discharge Planning.   Education Outcome: Acknowledges edcuation/In group clarification offered/Needs additional education  Clinical Observations/Feedback: Pt did not attend group.    Victorino Sparrow, LRT/CTRS         Victorino Sparrow A 11/10/2015 12:58 PM

## 2015-11-10 NOTE — BHH Group Notes (Signed)
[place] LCSW Group Therapy  11/10/2015 1:45 PM  Type of Therapy:  Group Therapy  Participation Level:  Did Not Attend  Patient offered to come to group and refused. Patient left sleeping in the bed.    Bruce Hayes 11/10/2015, 1:45 PM

## 2015-11-11 DIAGNOSIS — F1020 Alcohol dependence, uncomplicated: Secondary | ICD-10-CM | POA: Diagnosis present

## 2015-11-11 MED ORDER — LORAZEPAM 0.5 MG PO TABS
0.5000 mg | ORAL_TABLET | Freq: Every day | ORAL | Status: AC
  Administered 2015-11-12: 0.5 mg via ORAL
  Filled 2015-11-11: qty 1

## 2015-11-11 MED ORDER — LORAZEPAM 0.5 MG PO TABS
0.5000 mg | ORAL_TABLET | Freq: Two times a day (BID) | ORAL | Status: AC
  Administered 2015-11-11: 0.5 mg via ORAL
  Filled 2015-11-11: qty 1

## 2015-11-11 NOTE — Progress Notes (Signed)
Patient ID: Bruce Hayes, male   DOB: Oct 14, 1945, 70 y.o.   MRN: [id number]  DAR: Pt. Denies SI/HI and A/V Hallucinations. He reports sleep is fair, appetite is good, energy level is normal, and concentration is poor. He rates depression 8/10, hopelessness 0/10, and anxiety 6/10. Patient does report pain in his right knee and received PRN medication for this. Support and encouragement provided to the patient however patient remains isolated throughout most of the day. He is seen in the dayroom briefly watching television and will attend meals. Affect is flat and mood is depressed. Scheduled medications administered to patient per physician's orders. Q15 minute checks are maintained for safety.

## 2015-11-11 NOTE — Clinical Social Work Note (Signed)
Patient referred to [facility] and [facility], awaiting responses from facilities.   Edwyna Shell, LCSW Lead Clinical Social Worker Phone:  [phone]

## 2015-11-11 NOTE — Progress Notes (Signed)
Patient ID: Bruce Hayes, male   DOB: January 11, 1946, 70 y.o.   MRN: [id number]  Shortly before dinner, RN Susy Manor came to Probation officer and stated that Bruce Hayes had reported "dribbling" urine after he would urinate. He also reports this when he sleeps. Patient was given an incontinence pad and some adult diapers.

## 2015-11-11 NOTE — BHH Group Notes (Signed)
[place] [facility] Group Notes:  (Nursing/MHT/Case Management/Adjunct)  Date:  11/11/2015  Time:  9:49 AM  Type of Therapy:  Psychoeducational Skills  Participation Level:  Did Not Attend  Participation Quality:  N/A  Affect:  N/A  Cognitive:  N/A  Insight:  None  Engagement in Group:  None  Modes of Intervention:  N/A  Summary of Progress/Problems: Patient was invited but did not attend.   Gaylan Gerold E 11/11/2015, 9:49 AM

## 2015-11-11 NOTE — BHH Group Notes (Signed)
[facility] LCSW Group Therapy  11/11/2015 2:20 PM  Type of Therapy:  Group Therapy  Participation Level:  Did Not Attend   Beverely Pace 11/11/2015, 2:20 PM

## 2015-11-11 NOTE — Progress Notes (Signed)
D    Pt isolated to his room and did not attend karaoke this evening   He did get up for snack but went right back to bed   He has not complained of withdrawal and has not requested and medications A   Verbal support given    Medications offered   Q 15 min checks R  Pt safe at present

## 2015-11-11 NOTE — Progress Notes (Signed)
[facility] MD Progress Note  11/11/2015 12:15 PM  Patient Active Problem List   Diagnosis Date Noted  . Alcohol use disorder, severe, dependence ([place]) 11/08/2015  . Alcohol abuse 12/07/2013  . Cocaine abuse 12/07/2013  . Polysubstance abuse 12/07/2013  . Alcohol use disorder, moderate, dependence ([place]) 12/07/2013  . Depression   . Right radial head fracture 11/30/2011  . Acute renal failure ([place]) 11/28/2011  . Hyperkalemia 11/28/2011  . CELLULITIS AND ABSCESS OF UNSPECIFIED SITE 12/21/2008  . HEEL PAIN, LEFT 12/21/2008  . 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 RENAL INSUFFICIENCY, ACUTE 09/06/2007  . HEPATITIS B CARRIER 07/16/2007  . UNSPECIFIED DISORDER OF LIVER 07/03/2007  . HEPATITIS C 05/22/2007  . HYPERLIPIDEMIA 05/22/2007  . OBESITY 05/22/2007  . THROMBOCYTOPENIA 05/22/2007  . TOBACCO ABUSE 05/22/2007  . HYPERTENSION 05/22/2007  . COCAINE ABUSE, HX OF 05/22/2007    Diagnosis: Alcohol use disorder severe  Subjective: Patient is again lying in bed and minimally responsive although easily arousable. He does report he thinks his medications are over sedating. He denies any current suicidal or homicidal ideation, plan or intent.  Objective: Well-developed well nourished man lying in bed with the covers pulled over his head he is easily arousable but remained somewhat somnolent otherwise speech and motor appear within normal limits mood is described as all right affect is somewhat somnolent and irritable thought processes are limited but linear and goal-directed thought content denies any suicidal or homicidal ideation, plan or intent oriented but somnolent and minimally cooperative insight and judgment are fair IQ appears an average range     Current Facility-Administered Medications (Cardiovascular):  .  amLODipine (NORVASC) tablet 10 mg .  hydrALAZINE (APRESOLINE) tablet 25 mg     Current Facility-Administered Medications (Analgesics):  .   ibuprofen (ADVIL,MOTRIN) tablet 600 mg     Current Facility-Administered Medications (Other):  .  alum & mag hydroxide-simeth (MAALOX/MYLANTA) 200-200-20 MG/5ML suspension 30 mL .  hydrOXYzine (ATARAX/VISTARIL) tablet 25 mg .  Influenza vac split quadrivalent PF (FLUARIX) injection 0.5 mL .  loperamide (IMODIUM) capsule 2-4 mg .  [EXPIRED] LORazepam (ATIVAN) tablet 1 mg **FOLLOWED BY** [COMPLETED] LORazepam (ATIVAN) tablet 1 mg **FOLLOWED BY** LORazepam (ATIVAN) tablet 0.5 mg **FOLLOWED BY** [START ON 11/12/2015] LORazepam (ATIVAN) tablet 0.5 mg .  LORazepam (ATIVAN) tablet 1 mg .  magnesium hydroxide (MILK OF MAGNESIA) suspension 30 mL .  multivitamin with minerals tablet 1 tablet .  ondansetron (ZOFRAN-ODT) disintegrating tablet 4 mg .  pneumococcal 23 valent vaccine (PNU-IMMUNE) injection 0.5 mL .  thiamine (VITAMIN B-1) tablet 100 mg .  traZODone (DESYREL) tablet 50 mg  No current outpatient prescriptions on file.  Vital Signs:Blood pressure (!) 158/74, pulse 84, temperature 97.6 F (36.4 C), temperature source Oral, resp. rate 18, height 5\' 11"  (1.803 m), weight 97.5 kg (215 lb), SpO2 100 %.    Lab Results: No results found for this or any previous visit (from the past 48 hour(s)).  Physical Findings: AIMS: Facial and Oral Movements Muscles of Facial Expression: None, normal Lips and Perioral Area: None, normal Jaw: None, normal Tongue: None, normal,Extremity Movements Upper (arms, wrists, hands, fingers): None, normal Lower (legs, knees, ankles, toes): None, normal, Trunk Movements Neck, shoulders, hips: None, normal, Overall Severity Severity of abnormal movements (highest score from questions above): None, normal Incapacitation due to abnormal movements: None, normal Patient's awareness of abnormal movements (rate only patient's report): No Awareness, Dental Status Current problems with teeth and/or dentures?: No Does patient usually wear dentures?:  No  CIWA:   CIWA-Ar Total: 4 COWS:      Assessment/Plan: Patient reports that he finds his medications too sedating. We'll reduce the Ativan to 0.5 mg by mouth as remainder of the taper and observe how this affects him. At present his blood pressure appears to be improving and we will continue his blood pressure medicines as ordered. Social work will be working with patient for recommendations for outpatient follow-up for further management of his substance abuse and mental health issues.  [name], MD 11/11/2015, 12:15 PM

## 2015-11-12 NOTE — Progress Notes (Signed)
Recreation Therapy Notes  Date: 11/12/15 Time: 0930 Location: 300 Hall Dayroom  Group Topic: Stress Management  Goal Area(s) Addresses:  Patient will verbalize importance of using healthy stress management.  Patient will identify positive emotions associated with healthy stress management.   Intervention: Guided Imagery Script  Activity :  [facility].  LRT introduced the stress management technique of guided imagery.  LRT read a script that allowed patients to participate in the activity.  Patients were to follow along as LRT read script.    Education:  Stress Management, Discharge Planning.   Education Outcome: Acknowledges edcuation/In group clarification offered/Needs additional education  Clinical Observations/Feedback: Pt did not attend group.     Victorino Sparrow, LRT/CTRS         Victorino Sparrow A 11/12/2015 12:39 PM

## 2015-11-12 NOTE — Progress Notes (Deleted)
[facility] MD Progress Note  11/12/2015 12:11 PM  Patient Active Problem List   Diagnosis Date Noted  . Severe alcohol use disorder ([place]) 11/11/2015  . Alcohol use disorder, severe, dependence ([place]) 11/08/2015  . Alcohol abuse 12/07/2013  . Cocaine abuse 12/07/2013  . Polysubstance abuse 12/07/2013  . Alcohol use disorder, moderate, dependence ([place]) 12/07/2013  . Depression   . Right radial head fracture 11/30/2011  . Acute renal failure ([place]) 11/28/2011  . Hyperkalemia 11/28/2011  . CELLULITIS AND ABSCESS OF UNSPECIFIED SITE 12/21/2008  . HEEL PAIN, LEFT 12/21/2008  . 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 RENAL INSUFFICIENCY, ACUTE 09/06/2007  . HEPATITIS B CARRIER 07/16/2007  . UNSPECIFIED DISORDER OF LIVER 07/03/2007  . HEPATITIS C 05/22/2007  . HYPERLIPIDEMIA 05/22/2007  . OBESITY 05/22/2007  . THROMBOCYTOPENIA 05/22/2007  . TOBACCO ABUSE 05/22/2007  . HYPERTENSION 05/22/2007  . COCAINE ABUSE, HX OF 05/22/2007    Diagnosis:Alcohol use disorder severe with withdrawal, possible Wernicke's encephalopathy  Subjective: Patient denies any acute concerns. He reports his mood is "okay." We discussed his thoughts about drinking and he states in terms of giving up drinking "I think that's going to be really hard." "I don't think that I want to stop but I think I can definitely cut down." When asked what alcohol does for him that is positive he says "it tastes really nice." On questioning he acknowledges that it also may make him feel good. We discussed the possibility of a residential treatment program close to his home so he will be able to see his children perhaps [facility]. Patient did not immediately reject the idea and we discussed that it would be beneficial for his wife due to her concerns if he could help her out by participating. Patient denies any suicidal or homicidal ideation, plan or intent.  Patient has given [place] permission to contact his  wife Sandi Mealy at [phone]. Patient's wife was contacted and she had many questions. She relates that her husband was "completely functional" up until about a year ago. 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 He leaves he lost his job due to performance and not to contract ending. 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  Past wife about the patient's reaction to the Cymbalta and she did feel that it had a negative effect on him.  Objective: Well-developed thin male wearing hospital scrubs pajamas unshaven but otherwise fairly well groomed he appears mildly psychomotor retarded and affect is blunted he gives short and somewhat slow answers to questions although the answers are appropriate. The room is dark but even light his pupil seem mildly dilated but tracking appears to be within normal limits. He denies any current suicidal or homicidal ideation, plan or intent insight and judgment are limited patient is able to correctly named date and is oriented to person however he does not recall the location of the hospital that I told him yesterday.     Current Facility-Administered Medications (Cardiovascular):  .  amLODipine (NORVASC) tablet 10 mg .  hydrALAZINE (APRESOLINE) tablet 25 mg     Current Facility-Administered Medications (Analgesics):  .  ibuprofen (ADVIL,MOTRIN) tablet 600 mg     Current Facility-Administered Medications (Other):  .  alum & mag hydroxide-simeth (MAALOX/MYLANTA) 200-200-20 MG/5ML suspension 30 mL .  Influenza vac split quadrivalent PF (FLUARIX) injection 0.5 mL .  magnesium hydroxide (MILK OF MAGNESIA) suspension 30 mL .  multivitamin with minerals tablet 1 tablet .  pneumococcal 23  valent vaccine (PNU-IMMUNE) injection 0.5 mL .  thiamine (VITAMIN B-1) tablet 100 mg .  traZODone (DESYREL) tablet 50 mg  No current outpatient prescriptions on file.  Vital Signs:Blood pressure (!) 142/69, pulse (!) 105, temperature 98.4 F (36.9 C), temperature source Oral, resp. rate 18, height 5\' 11"  (1.803 m), weight 97.5 kg (215 lb), SpO2 100 %.    Lab Results: No results found for this or any previous visit (from the past 48 hour(s)).  Physical Findings: AIMS: Facial and Oral Movements Muscles of Facial Expression: None, normal Lips and Perioral Area: None, normal Jaw: None, normal Tongue: None, normal,Extremity Movements Upper (arms, wrists, hands, fingers): None, normal Lower (legs, knees, ankles, toes): None, normal, Trunk Movements Neck, shoulders, hips: None, normal, Overall Severity Severity of abnormal movements (highest score from questions above): None, normal Incapacitation due to abnormal movements: None, normal Patient's awareness of abnormal movements (rate only patient's report): No Awareness, Dental Status Current problems with teeth and/or dentures?: No Does patient usually wear dentures?: No  CIWA:  CIWA-Ar Total: 2 COWS:      Assessment/Plan: Patient denies any acute concerns blood pressure is still showing some relatively high systolic versus diastolic although it has improved from report from reported values earlier pulse at times is still mildly tachycardic. We will decrease Cymbalta to 30 mg by mouth daily due to possible negative effects on mood. Patient denies any hallucinations or visual disturbances his affect is somewhat flat and withdrawn and his memory remains with some impairments at present. Spoke at length with his wife who had many questions and agree with the plan that he would benefit from a residential program informed her that we are not planning to discharge him over the weekend and will hopefully be able to coordinate a voluntary referral  and possible even transfer to a residential program early next week. Currently the patient remains with unstable vital signs and must remain in hospital for medically supervised detoxification. He also still has some confusion and memory issues and is unclear at this time how fast or how much she will these will resolve.  [name], MD 11/12/2015, 12:11 PM

## 2015-11-12 NOTE — BHH Group Notes (Signed)
[facility] LCSW Group Therapy  11/12/2015 2:49 PM  Type of Therapy:  Group Therapy  Participation Level:  Did Not Attend    Bruce Hayes 11/12/2015, 2:49 PM

## 2015-11-12 NOTE — BHH Suicide Risk Assessment (Signed)
[facility] Discharge Suicide Risk Assessment   Principal Problem: Alcohol use disorder, severe, dependence ([place]) Discharge Diagnoses:  Patient Active Problem List   Diagnosis Date Noted  . Severe alcohol use disorder ([place]) [F10.20] 11/11/2015  . Alcohol use disorder, severe, dependence ([place]) [F10.20] 11/08/2015  . Alcohol abuse [F10.10] 12/07/2013  . Cocaine abuse [F14.10] 12/07/2013  . Polysubstance abuse [F19.10] 12/07/2013  . Alcohol use disorder, moderate, dependence ([place]) [F10.20] 12/07/2013  . Depression [F32.9]   . Right radial head fracture [S52.121A] 11/30/2011  . Acute renal failure ([place]) [N17.9] 11/28/2011  . Hyperkalemia [E87.5] 11/28/2011  . CELLULITIS AND ABSCESS OF UNSPECIFIED SITE [L03.90, L02.91] 12/21/2008  . HEEL PAIN, LEFT [M79.609] 12/21/2008  . ESOPHAGEAL VARICES [I85.00] 06/02/2008  . ABNORMAL ALPHA-FETOPROTEIN [R79.9] 09/12/2007  . RENAL INSUFFICIENCY, ACUTE [N18.9] 09/06/2007  . HEPATITIS B CARRIER [B18.1] 07/16/2007  . UNSPECIFIED DISORDER OF LIVER [K76.9] 07/03/2007  . HEPATITIS C [B17.10] 05/22/2007  . HYPERLIPIDEMIA [E78.5] 05/22/2007  . OBESITY [E66.9] 05/22/2007  . THROMBOCYTOPENIA [D69.6] 05/22/2007  . TOBACCO ABUSE [F17.200] 05/22/2007  . HYPERTENSION [I10] 05/22/2007  . COCAINE ABUSE, HX OF [Z91.89] 05/22/2007    Total Time spent with patient: 15 minutes  Musculoskeletal: Strength & Muscle Tone: within normal limits Gait & Station: in bed Patient leans: N/A  Psychiatric Specialty Exam: ROS  Blood pressure (!) 142/69, pulse (!) 105, temperature 98.4 F (36.9 C), temperature source Oral, resp. rate 18, height 5\' 11"  (1.803 m), weight 97.5 kg (215 lb), SpO2 100 %.Body mass index is 29.99 kg/m.  General Appearance: Casual  Eye Contact::  Minimal  Speech:  Clear and Coherent409  Volume:  Decreased  Mood:  sleepy  Affect:  Congruent  Thought Process:  Coherent  Orientation:  Negative  Thought Content:  Negative  Suicidal Thoughts:  No   Homicidal Thoughts:  No  Memory:  Negative  Judgement:  Fair  Insight:  Fair  Psychomotor Activity:  Decreased  Concentration:  Fair  Recall:  Good  Fund of Knowledge:Good  Language: Good  Akathisia:  No  Handed:  Right  AIMS (if indicated):     Assets:  Resilience  Sleep:  Number of Hours: 6.25  Cognition: WNL  ADL's:  Intact   Mental Status Per Nursing Assessment::   On Admission:     Demographic Factors:  Male, Age 24 or older, Low socioeconomic status and Unemployed  Loss Factors: Decline in physical health  Historical Factors: NA  Risk Reduction Factors:   Positive social support  Continued Clinical Symptoms:  Alcohol/Substance Abuse/Dependencies  Cognitive Features That Contribute To Risk:  None    Suicide Risk:  Mild:  Suicidal ideation of limited frequency, intensity, duration, and specificity.  There are no identifiable plans, no associated intent, mild dysphoria and related symptoms, good self-control (both objective and subjective assessment), few other risk factors, and identifiable protective factors, including available and accessible social support.    Plan Of Care/Follow-up recommendations:  Other:  Patient successfully completed detox and otherwise participated rather minimally in ward activities stating the medication was rather sedating. Currently he reports that he is ready to move on to the next step in his treatment and is ready to be discharged and denies any suicidal or homicidal ideation, plan or intent.  [name], MD 11/12/2015, 3:45 PM

## 2015-11-12 NOTE — Progress Notes (Signed)
The patient attended this evening's A.A.meeting and was appropriate.

## 2015-11-12 NOTE — BHH Suicide Risk Assessment (Signed)
[facility] Discharge Suicide Risk Assessment   Principal Problem: Alcohol use disorder, severe, dependence ([place]) Discharge Diagnoses:  Patient Active Problem List   Diagnosis Date Noted  . Severe alcohol use disorder ([address]) [F10.20] 11/11/2015  . Alcohol use disorder, severe, dependence ([place]) [F10.20] 11/08/2015  . Alcohol abuse [F10.10] 12/07/2013  . Cocaine abuse [F14.10] 12/07/2013  . Polysubstance abuse [F19.10] 12/07/2013  . Alcohol use disorder, moderate, dependence ([place]) [F10.20] 12/07/2013  . Depression [F32.9]   . Right radial head fracture [S52.121A] 11/30/2011  . Acute renal failure ([place]) [N17.9] 11/28/2011  . Hyperkalemia [E87.5] 11/28/2011  . CELLULITIS AND ABSCESS OF UNSPECIFIED SITE [L03.90, L02.91] 12/21/2008  . HEEL PAIN, LEFT [M79.609] 12/21/2008  . ESOPHAGEAL VARICES [I85.00] 06/02/2008  . ABNORMAL ALPHA-FETOPROTEIN [R79.9] 09/12/2007  . RENAL INSUFFICIENCY, ACUTE [N18.9] 09/06/2007  . HEPATITIS B CARRIER [B18.1] 07/16/2007  . UNSPECIFIED DISORDER OF LIVER [K76.9] 07/03/2007  . HEPATITIS C [B17.10] 05/22/2007  . HYPERLIPIDEMIA [E78.5] 05/22/2007  . OBESITY [E66.9] 05/22/2007  . THROMBOCYTOPENIA [D69.6] 05/22/2007  . TOBACCO ABUSE [F17.200] 05/22/2007  . HYPERTENSION [I10] 05/22/2007  . COCAINE ABUSE, HX OF [Z91.89] 05/22/2007    Total Time spent with patient: 15 minutes  Musculoskeletal: Strength & Muscle Tone: within normal limits Gait & Station: in bed Patient leans: N/A  Psychiatric Specialty Exam: ROS  Blood pressure (!) 142/69, pulse (!) 105, temperature 98.4 F (36.9 C), temperature source Oral, resp. rate 18, height 5\' 11"  (1.803 m), weight 97.5 kg (215 lb), SpO2 100 %.Body mass index is 29.99 kg/m.  General Appearance: Casual  Eye Contact::  Minimal  Speech:  Clear and Coherent409  Volume:  Decreased  Mood:  sleepy  Affect:  Congruent  Thought Process:  Coherent  Orientation:  Negative  Thought Content:  Negative  Suicidal Thoughts:  No   Homicidal Thoughts:  No  Memory:  Negative  Judgement:  Fair  Insight:  Fair  Psychomotor Activity:  Decreased  Concentration:  Fair  Recall:  Good  Fund of Knowledge:Good  Language: Good  Akathisia:  No  Handed:  Right  AIMS (if indicated):     Assets:  Resilience  Sleep:  Number of Hours: 6.25  Cognition: WNL  ADL's:  Intact   Mental Status Per Nursing Assessment::   On Admission:     Demographic Factors:  Male, Age 21 or older, Low socioeconomic status and Unemployed  Loss Factors: Decline in physical health  Historical Factors: NA  Risk Reduction Factors:   Positive social support  Continued Clinical Symptoms:  Alcohol/Substance Abuse/Dependencies  Cognitive Features That Contribute To Risk:  None    Suicide Risk:  Mild:  Suicidal ideation of limited frequency, intensity, duration, and specificity.  There are no identifiable plans, no associated intent, mild dysphoria and related symptoms, good self-control (both objective and subjective assessment), few other risk factors, and identifiable protective factors, including available and accessible social support.    Plan Of Care/Follow-up recommendations:  Other:  Patient successfully completed detox and otherwise participated rather minimally in ward activities stating the medication was rather sedating. Currently he reports that he is ready to move on to the next step in his treatment and is ready to be discharged and denies any suicidal or homicidal ideation, plan or intent.  [name], MD 11/12/2015, 11:38 AM

## 2015-11-12 NOTE — BHH Suicide Risk Assessment (Signed)
[facility] Discharge Suicide Risk Assessment   Principal Problem: Alcohol use disorder, severe, dependence ([place]) Discharge Diagnoses:  Patient Active Problem List   Diagnosis Date Noted  . Severe alcohol use disorder ([place]) [F10.20] 11/11/2015  . Alcohol use disorder, severe, dependence ([place]) [F10.20] 11/08/2015  . Alcohol abuse [F10.10] 12/07/2013  . Cocaine abuse [F14.10] 12/07/2013  . Polysubstance abuse [F19.10] 12/07/2013  . Alcohol use disorder, moderate, dependence ([place]) [F10.20] 12/07/2013  . Depression [F32.9]   . Right radial head fracture [S52.121A] 11/30/2011  . Acute renal failure ([place]) [N17.9] 11/28/2011  . Hyperkalemia [E87.5] 11/28/2011  . CELLULITIS AND ABSCESS OF UNSPECIFIED SITE [L03.90, L02.91] 12/21/2008  . HEEL PAIN, LEFT [M79.609] 12/21/2008  . ESOPHAGEAL VARICES [I85.00] 06/02/2008  . ABNORMAL ALPHA-FETOPROTEIN [R79.9] 09/12/2007  . RENAL INSUFFICIENCY, ACUTE [N18.9] 09/06/2007  . HEPATITIS B CARRIER [B18.1] 07/16/2007  . UNSPECIFIED DISORDER OF LIVER [K76.9] 07/03/2007  . HEPATITIS C [B17.10] 05/22/2007  . HYPERLIPIDEMIA [E78.5] 05/22/2007  . OBESITY [E66.9] 05/22/2007  . THROMBOCYTOPENIA [D69.6] 05/22/2007  . TOBACCO ABUSE [F17.200] 05/22/2007  . HYPERTENSION [I10] 05/22/2007  . COCAINE ABUSE, HX OF [Z91.89] 05/22/2007    Total Time spent with patient: 15 minutes  Musculoskeletal: Strength & Muscle Tone: within normal limits Gait & Station: in bed Patient leans: N/A  Psychiatric Specialty Exam: ROS  Blood pressure (!) 142/69, pulse (!) 105, temperature 98.4 F (36.9 C), temperature source Oral, resp. rate 18, height 5\' 11"  (1.803 m), weight 97.5 kg (215 lb), SpO2 100 %.Body mass index is 29.99 kg/m.  General Appearance: Casual  Eye Contact::  Minimal  Speech:  Clear and Coherent409  Volume:  Decreased  Mood:  sleepy  Affect:  Congruent  Thought Process:  Coherent  Orientation:  Negative  Thought Content:  Negative  Suicidal Thoughts:  No   Homicidal Thoughts:  No  Memory:  Negative  Judgement:  Fair  Insight:  Fair  Psychomotor Activity:  Decreased  Concentration:  Fair  Recall:  Good  Fund of Knowledge:Good  Language: Good  Akathisia:  No  Handed:  Right  AIMS (if indicated):     Assets:  Resilience  Sleep:  Number of Hours: 6.25  Cognition: WNL  ADL's:  Intact   Mental Status Per Nursing Assessment::   On Admission:     Demographic Factors:  Male, Age 68 or older, Low socioeconomic status and Unemployed  Loss Factors: Decline in physical health  Historical Factors: NA  Risk Reduction Factors:   Positive social support  Continued Clinical Symptoms:  Alcohol/Substance Abuse/Dependencies  Cognitive Features That Contribute To Risk:  None    Suicide Risk:  Mild:  Suicidal ideation of limited frequency, intensity, duration, and specificity.  There are no identifiable plans, no associated intent, mild dysphoria and related symptoms, good self-control (both objective and subjective assessment), few other risk factors, and identifiable protective factors, including available and accessible social support.    Plan Of Care/Follow-up recommendations:  Other:  Patient successfully completed detox and otherwise participated rather minimally in ward activities stating the medication was rather sedating. Currently he reports that he is ready to move on to the next step in his treatment and is ready to be discharged and denies any suicidal or homicidal ideation, plan or intent.  [name], MD 11/12/2015, 3:45 PM

## 2015-11-12 NOTE — Progress Notes (Signed)
D: Patient's self inventory sheet: patient has fair sleep, recieved sleep medication.fair  Appetite, low energy level, poor concentration. Rated depression 8/10, hopeless 8/10, anxiety 9/10. SI/HI/AVH: Denies current Si, denies HI. Physical complaints are pain in right knee, tiredness. Goal is "get some sleep". Plans to work on "rest - can't focus".   A: Medications administered, assessed medication knowledge and education given on medication regimen.  Emotional support and encouragement given patient. R: Denies SI and HI , contracts for safety. Safety maintained with 15 minute checks.

## 2015-11-12 NOTE — Progress Notes (Addendum)
Patient also referred to [facility], no beds at this time.  Referral has been faxed. Patient may need to discharge home and follow up with [facility] daily until bed is available.   LCSW has called [facility] intake worker: Olivia Mackie and spoke about referral for patient. Faxed over information per request to review and see if patient is appropriate.    Will follow up with referral once reviewed.  Pending [facility] referral as well.  LCSW spoke with [facility] and they are unable to accept him due to his insurance per intake.   Lane Hacker, MSW Clinical Social Work: [employer]

## 2015-11-13 DIAGNOSIS — F1020 Alcohol dependence, uncomplicated: Principal | ICD-10-CM

## 2015-11-13 MED ORDER — HYDROXYZINE HCL 25 MG PO TABS
ORAL_TABLET | ORAL | Status: AC
  Filled 2015-11-13: qty 1

## 2015-11-13 MED ORDER — HYDROXYZINE HCL 25 MG PO TABS
25.0000 mg | ORAL_TABLET | Freq: Four times a day (QID) | ORAL | Status: DC | PRN
  Administered 2015-11-13 – 2015-11-16 (×7): 25 mg via ORAL
  Filled 2015-11-13 (×6): qty 1

## 2015-11-13 NOTE — Progress Notes (Signed)
[facility] Group Notes:  (Nursing/MHT/Case Management/Adjunct)  Date:  11/13/2015  Time:  2045 Type of Therapy:  wrap up group  Participation Level:  Active  Participation Quality:  Appropriate, Attentive, Sharing and Supportive  Affect:  Appropriate  Cognitive:  Appropriate  Insight:  Improving  Engagement in Group:  Engaged  Modes of Intervention:  Clarification, Education and Support  Summary of Progress/Problems: Pt entered group towards the end but did participate. Pt reported enjoying the earlier groups today and the peer support. Pt wants to go to the [facility]. Pt shared that he has been drinking himself into a stupor, waking up , and doing it all again.  Pt shared that he has a 76yr old nephew whom he wants to get sober for. He also fears that one day someone will find him unresponsive from too much drinking.   Shellia Cleverly 11/13/2015, 10:38 PM

## 2015-11-13 NOTE — Progress Notes (Signed)
Data. Patient denies HI/AVH. Patient denies SI verbally, but on his self assessment he does report SI. He is able to verbally contract for safety on the unit and to agree to come to staff before acting on any self harm thoughts/feelings. His affect is blunt and his mood, per patient , is depressed. He had an episode of elevated BP at 5pm and received a vistaril. No acute symptoms of distress noted at that time. Patient isolating in his room for much of the shift, except for meals. On his self assessment patientreports 7/10 for depression and anxiety and 6/10 for hopelessness. His goal for today is: "A plan for discharge".  Action. Emotional support and encouragement offered. Education provided on medication, indications and side effect. Q 15 minute checks done for safety. Response. Safety on the unit maintained through 15 minute checks.  Medications taken as prescribed. Remained calm and appropriate through out shift.

## 2015-11-13 NOTE — BHH Group Notes (Signed)
[facility] Group Notes:  (Nursing/MHT/Case Management/Adjunct)  Date:  11/13/2015  Time:  11:30 AM  Type of Therapy:  Nurse Education  Participation Level:  Active  Participation Quality:  Appropriate  Affect:  Appropriate  Cognitive:  Appropriate  Insight:  Appropriate  Engagement in Group:  Engaged  Modes of Intervention:  Discussion  Summary of Progress/Problems: Attended, stated goal was "get into rehab to get myself together. I need more treatment".  Bruce Hayes [name] 11/13/2015, 11:30 AM

## 2015-11-13 NOTE — Progress Notes (Signed)
D  Pt. Denies SI and HI, no complaints of pain or discomfort noted at present time.  A Writer offered support and encouragement,  Discussed discharge plans with pt.  R  Pt. Is hoping to discharge to a long term treatment center. Pt. Remains safe on the unit.

## 2015-11-13 NOTE — Progress Notes (Signed)
[facility] MD Progress Note  11/13/2015 1:31 PM  Patient Active Problem List   Diagnosis Date Noted  . Severe alcohol use disorder ([place]) 11/11/2015  . Alcohol use disorder, severe, dependence ([place]) 11/08/2015  . Alcohol abuse 12/07/2013  . Cocaine abuse 12/07/2013  . Polysubstance abuse 12/07/2013  . Alcohol use disorder, moderate, dependence ([place]) 12/07/2013  . Depression   . Right radial head fracture 11/30/2011  . Acute renal failure ([place]) 11/28/2011  . Hyperkalemia 11/28/2011  . CELLULITIS AND ABSCESS OF UNSPECIFIED SITE 12/21/2008  . HEEL PAIN, LEFT 12/21/2008  . 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 RENAL INSUFFICIENCY, ACUTE 09/06/2007  . HEPATITIS B CARRIER 07/16/2007  . UNSPECIFIED DISORDER OF LIVER 07/03/2007  . HEPATITIS C 05/22/2007  . HYPERLIPIDEMIA 05/22/2007  . OBESITY 05/22/2007  . THROMBOCYTOPENIA 05/22/2007  . TOBACCO ABUSE 05/22/2007  . HYPERTENSION 05/22/2007  . COCAINE ABUSE, HX OF 05/22/2007    Diagnosis: Alcohol use disorder severe  Subjective: Patient has mild headache, and tremors today. Patient reported he slept good and eating fine. patient appeared participating in milieu therapy and group therapy. Patient stated that I needed to go to the rehabilitation so that I can stay free from drinking alcohol. Patient denied depression and anxiety.   Objective: Well-developed well nourished man, awake, alert, calm and cooperative during this visit,speech and motor appear within normal limits . Patientthought processes are limited but linear and goal-directed thought content denies suicidal or homicidal ideation, plan or intent oriented. Patient insight and judgment are fair IQ appears an average range     Current Facility-Administered Medications (Cardiovascular):  .  amLODipine (NORVASC) tablet 10 mg .  hydrALAZINE (APRESOLINE) tablet 25 mg     Current Facility-Administered Medications (Analgesics):  .  ibuprofen  (ADVIL,MOTRIN) tablet 600 mg     Current Facility-Administered Medications (Other):  .  alum & mag hydroxide-simeth (MAALOX/MYLANTA) 200-200-20 MG/5ML suspension 30 mL .  Influenza vac split quadrivalent PF (FLUARIX) injection 0.5 mL .  magnesium hydroxide (MILK OF MAGNESIA) suspension 30 mL .  multivitamin with minerals tablet 1 tablet .  pneumococcal 23 valent vaccine (PNU-IMMUNE) injection 0.5 mL .  thiamine (VITAMIN B-1) tablet 100 mg .  traZODone (DESYREL) tablet 50 mg  No current outpatient prescriptions on file.  Vital Signs:Blood pressure (!) 165/68, pulse 89, temperature 98.4 F (36.9 C), resp. rate 16, height 5\' 11"  (1.803 m), weight 97.5 kg (215 lb), SpO2 100 %.    Lab Results: No results found for this or any previous visit (from the past 48 hour(s)).  Physical Findings: AIMS: Facial and Oral Movements Muscles of Facial Expression: None, normal Lips and Perioral Area: None, normal Jaw: None, normal Tongue: None, normal,Extremity Movements Upper (arms, wrists, hands, fingers): None, normal Lower (legs, knees, ankles, toes): None, normal, Trunk Movements Neck, shoulders, hips: None, normal, Overall Severity Severity of abnormal movements (highest score from questions above): None, normal Incapacitation due to abnormal movements: None, normal Patient's awareness of abnormal movements (rate only patient's report): No Awareness, Dental Status Current problems with teeth and/or dentures?: No Does patient usually wear dentures?: No  CIWA:  CIWA-Ar Total: 4 COWS:      Assessment/Plan:  Reviewed patient current treatment plan and medication management  Patient does not required any further changes with his medication management today  Patient is encouraged to participate in milieu therapy and group therapy  Patient contract for safety during this evaluation, Safety will be monitored every 15 minutes while being hospitalized  Patient is  able to tolerate his  medication Ativan to 0.5 mg by mouth as remainder of the taper and observe how this affects him.   At present his blood pressure appears to be improving and we will continue his blood pressure medicines as ordered.   Social work will be working with patient for recommendations for outpatient follow-up for further management of his substance abuse and mental health issues.  Ambrose Finland, MD 11/13/2015, 1:31 PM

## 2015-11-13 NOTE — Progress Notes (Signed)
D.  Pt flat but pleasant on approach, denies complaints at this time.  Pt was positive for evening wrap up group, see group notes.  Pt observed interacting appropriately with peers on the unit.  Pt denies SI/HI/hallucinations at this time.  A.  Support and encouragement offered, medication given as ordered  R.  Pt remains safe on the unit, will continue to monitor.

## 2015-11-13 NOTE — Plan of Care (Signed)
Problem: Health Behavior/Discharge Planning: Goal: Compliance with therapeutic regimen will improve Outcome: Progressing Patient has been able to take medications appropriately and with minimal staff encouragement this shift.

## 2015-11-13 NOTE — BHH Group Notes (Signed)
  [facility] LCSW Group Therapy Note   11/13/2015  and  10:00 AM  Type of Therapy and Topic:  Group Therapy: Avoiding Self-Sabotaging and Enabling Behaviors  Participation Level:  Active  Participation Quality:  Appropriate, Attentive and Sharing  Affect:  Excited  Cognitive:  Alert and Appropriate  Insight:  Developing/Improving  Engagement in Therapy:  Developing/Improving   Therapeutic models used: Cognitive Behavioral Therapy,  Person-Centered Therapy and Motivational Interviewing  Modes of Intervention:  Discussion, Exploration, Orientation, Rapport Building, Socialization and Support   Summary of Progress/Problems:  The main focus of today's process group was for the patient to identify ways in which they have in the past sabotaged their own recovery. Motivational Interviewing was utilized to identify motivation they may have for wanting to change. The Stages of Change were explained using a handout, and patients identified where they currently are with regard to stages of change. Patient shared strong feelings of not wishing his great nephew to know of his alcohol use. Patient is in action stage of making plan to return to school where he has found acceptance and encouragement.    Sheilah Pigeon, LCSW

## 2015-11-14 MED ORDER — HYDRALAZINE HCL 50 MG PO TABS
50.0000 mg | ORAL_TABLET | Freq: Every day | ORAL | Status: DC
  Administered 2015-11-14: 25 mg via ORAL
  Administered 2015-11-15: 50 mg via ORAL
  Filled 2015-11-14 (×3): qty 1

## 2015-11-14 NOTE — BHH Group Notes (Signed)
[place] [facility] LCSW Group Therapy  11/14/2015 10:05 to 10:55 AM  Type of Therapy:  Group Therapy  Participation Level:  Minimal  Participation Quality:  Inattentive  Affect:  Flat  Cognitive:  Oriented  Insight:  Limited  Engagement in Therapy:  Limited  Modes of Intervention:  Activity, Exploration, Socialization and Support  Summary of Progress/Problems: Topic for today was thoughts and feelings regarding discharge. We discussed fears of upcoming changes including judgements, expectations and stigma of mental health issues. Patient's shared their current attitude toward the future; patient choosing the word "overwhelmed."  Patient engaged minimally in discussion today and shared that "both my blood pressure and my mind are racing.".  Patient chose a visual to represent decompensation as needing help and improvement as beautiful art.   Sheilah Pigeon, LCSW

## 2015-11-14 NOTE — Plan of Care (Signed)
Problem: Activity: Goal: Interest or engagement in activities will improve Outcome: Progressing Pt attended evening wrap up group

## 2015-11-14 NOTE — Progress Notes (Signed)
D    Pt is pleasant and cooperative    He has been up and interactive on the milieu and attended group    He is talking more and less irritable A    Verbal support given    Medications administered and effectiveness monitored   Q 15 min checks R    Pt is safe at present

## 2015-11-14 NOTE — Progress Notes (Signed)
[facility] MD Progress Note  11/14/2015 2:05 PM  Patient Active Problem List   Diagnosis Date Noted  . Severe alcohol use disorder ([place]) 11/11/2015  . Alcohol use disorder, severe, dependence ([place]) 11/08/2015  . Alcohol abuse 12/07/2013  . Cocaine abuse 12/07/2013  . Polysubstance abuse 12/07/2013  . Alcohol use disorder, moderate, dependence ([place]) 12/07/2013  . Depression   . Right radial head fracture 11/30/2011  . Acute renal failure ([place]) 11/28/2011  . Hyperkalemia 11/28/2011  . CELLULITIS AND ABSCESS OF UNSPECIFIED SITE 12/21/2008  . HEEL PAIN, LEFT 12/21/2008  . 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 RENAL INSUFFICIENCY, ACUTE 09/06/2007  . HEPATITIS B CARRIER 07/16/2007  . UNSPECIFIED DISORDER OF LIVER 07/03/2007  . HEPATITIS C 05/22/2007  . HYPERLIPIDEMIA 05/22/2007  . OBESITY 05/22/2007  . THROMBOCYTOPENIA 05/22/2007  . TOBACCO ABUSE 05/22/2007  . HYPERTENSION 05/22/2007  . COCAINE ABUSE, HX OF 05/22/2007    Diagnosis: Alcohol use disorder severe  Subjective: Patient complaining of "Sore shoulder" because of  Lied down on his shoulder during last night. He stated that he is optimistic to find the place at [facility]" because he gets  Social Security check  Every month which he can patient with him. Patient endorses little depression and anxiety but no safety concerns.  Objective: Patient seen by this M.D.  During this morning rounds. Patient appeared better mood  And anxiety and yesterday has less emotional difficulties today. Patient has no absorbable withdrawal symptoms.. Patient reportedly has a good appetite  And sleep is fine except few disturbances overnight.  Patient reported occasionally gets annoyed and irritable but overall no agitation or aggressive behavior. Patient has been able to  Comply with milieu therapy and group therapies. Staff reported he has now augment with the peer in the unit this afternoon. Patient stated that  I needed to go to the rehabilitation so that I can stay free from drinking alcohol. Patient denied acute symptoms ofdepression and anxiety.  Well-developed well nourished man, awake, alert, calm and cooperative during this visit,speech and motor appear within normal limits . Patientthought processes are limited but linear and goal-directed thought content denies suicidal or homicidal ideation, plan or intent oriented. Patient insight and judgment are fair IQ appears an average range    Current Facility-Administered Medications (Cardiovascular):  .  amLODipine (NORVASC) tablet 10 mg .  [START ON 11/15/2015] hydrALAZINE (APRESOLINE) tablet 50 mg     Current Facility-Administered Medications (Analgesics):  .  ibuprofen (ADVIL,MOTRIN) tablet 600 mg     Current Facility-Administered Medications (Other):  .  alum & mag hydroxide-simeth (MAALOX/MYLANTA) 200-200-20 MG/5ML suspension 30 mL .  hydrOXYzine (ATARAX/VISTARIL) tablet 25 mg .  Influenza vac split quadrivalent PF (FLUARIX) injection 0.5 mL .  magnesium hydroxide (MILK OF MAGNESIA) suspension 30 mL .  multivitamin with minerals tablet 1 tablet .  pneumococcal 23 valent vaccine (PNU-IMMUNE) injection 0.5 mL .  thiamine (VITAMIN B-1) tablet 100 mg .  traZODone (DESYREL) tablet 50 mg  No current outpatient prescriptions on file.  Vital Signs:Blood pressure (!) 174/69, pulse 90, temperature 98.5 F (36.9 C), resp. rate (!) 24, height 5\' 11"  (1.803 m), weight 97.5 kg (215 lb), SpO2 100 %.    Lab Results: No results found for this or any previous visit (from the past 48 hour(s)).  Physical Findings: AIMS: Facial and Oral Movements Muscles of Facial Expression: None, normal Lips and Perioral Area: None, normal Jaw: None, normal Tongue: None, normal,Extremity Movements Upper (arms, wrists, hands, fingers):  None, normal Lower (legs, knees, ankles, toes): None, normal, Trunk Movements Neck, shoulders, hips: None, normal, Overall  Severity Severity of abnormal movements (highest score from questions above): None, normal Incapacitation due to abnormal movements: None, normal Patient's awareness of abnormal movements (rate only patient's report): No Awareness, Dental Status Current problems with teeth and/or dentures?: No Does patient usually wear dentures?: No  CIWA:  CIWA-Ar Total: 8 COWS:      Assessment/Plan:  Reviewed patient current treatment plan and medication management  Patient does not required any further changes with his medication management today  Patient is encouraged to participate in milieu therapy and group therapy  Patient contract for safety during this evaluation, Safety will be monitored every 15 minutes while being hospitalized  Continue trazodone 50 mg at bedtime as needed for insomnia, hydroxyzine 25 mg every 6 hours as needed for anxiety  Patient is able to tolerate his medication Ativan to 0.5 mg by mouth as remainder of the taper and observe how this affects him.   At present his blood pressure appears to be improving and we will continue Hydralazine 50 mg by mouth daily.   Social work will be working with patient for recommendations for outpatient follow-up for further management of his substance abuse and mental health issues.  Ambrose Finland, MD 11/14/2015, 2:05 PM

## 2015-11-14 NOTE — Progress Notes (Signed)
Patient attended AA group meeting.  

## 2015-11-14 NOTE — Progress Notes (Signed)
Patient ID: Bruce Hayes, male   DOB: 01-Feb-1945, 70 y.o.   MRN: [id number]   D: Pt has been very flat and depressed on the unit today, he has been very isolative and has been in the bed all day. Pt did not attend any groups and has not engaged in treatment. Pt reported being negative SI/HI, no AH/VH noted. Pt reported that his depression was a 6, his hopelessness was a 5, and his anxiety 6. Pt has continued to have a high blood pressure, Tanika NP was made aware new orders noted. Tanika NP also put in orders for a Internal Medicine consult. Pt reported that his goal was to get help with going to rehab, SW made aware. A: 15 min checks continued for patient safety. R: Pt safety maintained.

## 2015-11-15 MED ORDER — HYDRALAZINE HCL 50 MG PO TABS
50.0000 mg | ORAL_TABLET | Freq: Two times a day (BID) | ORAL | Status: DC
  Administered 2015-11-15 – 2015-11-18 (×6): 50 mg via ORAL
  Filled 2015-11-15 (×10): qty 1

## 2015-11-15 NOTE — Progress Notes (Signed)
[facility] MD Progress Note  11/15/2015 2:06 PM  Patient Active Problem List   Diagnosis Date Noted  . Severe alcohol use disorder ([place]) 11/11/2015  . Alcohol use disorder, severe, dependence ([place]) 11/08/2015  . Alcohol abuse 12/07/2013  . Cocaine abuse 12/07/2013  . Polysubstance abuse 12/07/2013  . Alcohol use disorder, moderate, dependence ([place]) 12/07/2013  . Depression   . Right radial head fracture 11/30/2011  . Acute renal failure ([place]) 11/28/2011  . Hyperkalemia 11/28/2011  . CELLULITIS AND ABSCESS OF UNSPECIFIED SITE 12/21/2008  . HEEL PAIN, LEFT 12/21/2008  . 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 RENAL INSUFFICIENCY, ACUTE 09/06/2007  . HEPATITIS B CARRIER 07/16/2007  . UNSPECIFIED DISORDER OF LIVER 07/03/2007  . HEPATITIS C 05/22/2007  . HYPERLIPIDEMIA 05/22/2007  . OBESITY 05/22/2007  . THROMBOCYTOPENIA 05/22/2007  . TOBACCO ABUSE 05/22/2007  . HYPERTENSION 05/22/2007  . COCAINE ABUSE, HX OF 05/22/2007    Diagnosis: Alcohol use disorder, severe  Subjective: Patient states he would like to move on to the next level of care and denies any suicidal or homicidal ideation, plan or intent. He does not wish to return to the environment that he came from feeling that this would simply result in immediate relapse and states he is willing to accept a variety of other options. He has been hearing about one option from another patient and it may be difficult for him to understand that this is not possible for him.  Objective: Well-developed well-nourished man in no apparent distress pleasant and appropriate although slightly irritable mood is good affect is as stated if he does not hear what he wants to hear, thought that may speech and motor appear within normal limits thought processes linear and goal-directed thought content no suicidal or homicidal ideation, plan or intent no psychosis alert and oriented 3 insight and judgment are fair IQ appears  an average range     Current Facility-Administered Medications (Cardiovascular):  .  amLODipine (NORVASC) tablet 10 mg .  hydrALAZINE (APRESOLINE) tablet 50 mg     Current Facility-Administered Medications (Analgesics):  .  ibuprofen (ADVIL,MOTRIN) tablet 600 mg     Current Facility-Administered Medications (Other):  .  alum & mag hydroxide-simeth (MAALOX/MYLANTA) 200-200-20 MG/5ML suspension 30 mL .  hydrOXYzine (ATARAX/VISTARIL) tablet 25 mg .  Influenza vac split quadrivalent PF (FLUARIX) injection 0.5 mL .  magnesium hydroxide (MILK OF MAGNESIA) suspension 30 mL .  multivitamin with minerals tablet 1 tablet .  pneumococcal 23 valent vaccine (PNU-IMMUNE) injection 0.5 mL .  thiamine (VITAMIN B-1) tablet 100 mg .  traZODone (DESYREL) tablet 50 mg  No current outpatient prescriptions on file.  Vital Signs:Blood pressure (!) 154/69, pulse 90, temperature 98.1 F (36.7 C), temperature source Oral, resp. rate 20, height 5\' 11"  (1.803 m), weight 97.5 kg (215 lb), SpO2 100 %.    Lab Results: No results found for this or any previous visit (from the past 48 hour(s)).  Physical Findings: AIMS: Facial and Oral Movements Muscles of Facial Expression: None, normal Lips and Perioral Area: None, normal Jaw: None, normal Tongue: None, normal,Extremity Movements Upper (arms, wrists, hands, fingers): None, normal Lower (legs, knees, ankles, toes): None, normal, Trunk Movements Neck, shoulders, hips: None, normal, Overall Severity Severity of abnormal movements (highest score from questions above): None, normal Incapacitation due to abnormal movements: None, normal Patient's awareness of abnormal movements (rate only patient's report): No Awareness, Dental Status Current problems with teeth and/or dentures?: No Does patient usually wear dentures?: No  CIWA:  CIWA-Ar Total: 0 COWS:      Assessment/Plan: Patient appears to successfully detoxed and is ready to move on to the next  level of care. Social work is working with him to help identify options for his next level. Patient has some hypertension but latest value 154/69 will not change current medications as his diastolic blood pressure would probably not tolerate any further reduction and will continue to monitor.  [name], MD 11/15/2015, 2:06 PM

## 2015-11-15 NOTE — Clinical Social Work Note (Signed)
Patient is not eligible for [facility] - must have [facility].  Edwyna Shell, LCSW Lead Clinical Social Worker Phone:  [phone]

## 2015-11-15 NOTE — BHH Group Notes (Signed)
[facility] LCSW Group Therapy  11/15/2015 4:10 PM  Type of Therapy:  Group Therapy  Participation Level:  Active  Participation Quality:  Attentive  Affect:  Appropriate  Cognitive:  Alert and Oriented  Insight:  Improving  Engagement in Therapy:  Improving  Modes of Intervention:  Discussion, Education, Exploration, Limit-setting, Problem-solving, Rapport Building, Socialization and Support  Summary of Progress/Problems: Today's Topic: Overcoming Obstacles. Patients identified one short term goal and potential obstacles in reaching this goal. Patients processed barriers involved in overcoming these obstacles. Patients identified steps necessary for overcoming these obstacles and explored motivation (internal and external) for facing these difficulties head on. Corrado was attentive and engaged during today's processing group. He shared that he plans to go into treatment but due to his insurance, is having trouble being admitted. Pt is hoping for [facility] referral and plans to pursue treatment there. Pt continues to show progress in the group setting with improving insight.   Celisa Schoenberg N Smart LCSW 11/15/2015, 4:10 PM

## 2015-11-15 NOTE — Progress Notes (Signed)
Patient ID: Bruce Hayes, male   DOB: [birth year], 70 y.o.   MRN: [id number] D: Client visible on the unit, animated, interacting with peers and staff. Client reports of his day "better, since we meet with SW, she trying to help [place] get a place, so I feel better about that, have a better understanding of what's going on" "I want to leave here and go to a program, that's why I came here" Client complains of right shoulder pain "5" of 10. "I slept on it, I was so tired when I got here the first nigth" A: Writer provided emotional support, medications reviewed and administered as ordered. Staff will monitor q61min for safety. R: Client is safe on the unit, attended group.

## 2015-11-15 NOTE — Progress Notes (Signed)
Patient ID: Bruce Hayes, male   DOB: Jan 06, 1946, 70 y.o.   MRN: [id number]  DAR: Pt. Denies SI/HI and A/V Hallucinations. He reports sleep is good, appetite is good, energy level is normal, and concentration is good. He rates depression 6/10, hopelessness 4/10, and anxiety 6/10. Patient does report pain in right shoulder however refuses intervention at this time. Support and encouragement provided to the patient. Scheduled medications administered to patient per physician's orders. Patient continues to have elevated BP. MD Sharolyn Douglas was notified of this during treatment team. Patient is minimal but cooperative with staff. He does voice concerns about discharge as he feels like he will relapse without going to a program after discharge from [facility]. A PRN Vistaril was administered to patient to decrease anxiety which provided some relief. Q15 minute checks are maintained for safety.

## 2015-11-15 NOTE — Progress Notes (Signed)
[facility] referral faxed to [facility] for authorization number 11/15/2015 4:12 PM per patient request. Pt declined at [facility] due to insurance.   Maxie Better, MSW, LCSW Clinical Social Worker 11/15/2015 4:13 PM

## 2015-11-15 NOTE — BHH Group Notes (Signed)
[facility] LCSW Aftercare Discharge Planning Group Note   11/15/2015 12:46 PM  Participation Quality:  Invited. DID NOT ATTEND> pt chose to remain in bed.   [employer]

## 2015-11-15 NOTE — Tx Team (Signed)
Interdisciplinary Treatment and Diagnostic Plan Update  11/15/2015 Time of Session: 12:47 PM  Bruce Hayes MRN: [id number]  Principal Diagnosis: Alcohol use disorder, severe, dependence ([place])  Secondary Diagnoses: Principal Problem:   Alcohol use disorder, severe, dependence ([place]) Active Problems:   Severe alcohol use disorder ([place])   Current Medications:  Current Facility-Administered Medications  Medication Dose Route Frequency Provider Last Rate Last Dose  . alum & mag hydroxide-simeth (MAALOX/MYLANTA) 200-200-20 MG/5ML suspension 30 mL  30 mL Oral Q4H PRN Ethelene Hal, NP      . amLODipine (NORVASC) tablet 10 mg  10 mg Oral Daily Ethelene Hal, NP   10 mg at 11/15/15 0810  . hydrALAZINE (APRESOLINE) tablet 50 mg  50 mg Oral Daily Derrill Center, NP   50 mg at 11/15/15 0810  . hydrOXYzine (ATARAX/VISTARIL) tablet 25 mg  25 mg Oral Q6H PRN Ethelene Hal, NP   25 mg at 11/15/15 0815  . ibuprofen (ADVIL,MOTRIN) tablet 600 mg  600 mg Oral Q6H PRN Niel Hummer, NP   600 mg at 11/14/15 2216  . Influenza vac split quadrivalent PF (FLUARIX) injection 0.5 mL  0.5 mL Intramuscular Tomorrow-1000 [name], MD      . magnesium hydroxide (MILK OF MAGNESIA) suspension 30 mL  30 mL Oral Daily PRN Ethelene Hal, NP      . multivitamin with minerals tablet 1 tablet  1 tablet Oral Daily [name], PA-C   1 tablet at 11/15/15 0809  . pneumococcal 23 valent vaccine (PNU-IMMUNE) injection 0.5 mL  0.5 mL Intramuscular Tomorrow-1000 [name], MD      . thiamine (VITAMIN B-1) tablet 100 mg  100 mg Oral Daily Ethelene Hal, NP   100 mg at 11/15/15 0809  . traZODone (DESYREL) tablet 50 mg  50 mg Oral QHS PRN Ethelene Hal, NP   50 mg at 11/13/15 2201    PTA Medications: Prescriptions Prior to Admission  Medication Sig Dispense Refill Last Dose  . amLODipine (NORVASC) 10 MG tablet Take 1 tablet (10 mg total) by mouth daily. 30  tablet 0   . aspirin EC 325 MG tablet Take 1 tablet (325 mg total) by mouth daily. (Patient not taking: Reported on 11/07/2015) 30 tablet 0 Not Taking at Unknown time  . atorvastatin (LIPITOR) 10 MG tablet Take 1 tablet (10 mg total) by mouth daily. For increased cholesterol (Patient not taking: Reported on 11/07/2015) 30 tablet 3 Not Taking at Unknown time  . cephALEXin (KEFLEX) 500 MG capsule Take 1 capsule (500 mg total) by mouth 4 (four) times daily. (Patient not taking: Reported on 11/07/2015) 28 capsule 0 Not Taking at Unknown time  . chlorthalidone (HYGROTON) 25 MG tablet Take 1 tablet (25 mg total) by mouth daily. (Patient not taking: Reported on 11/07/2015) 14 tablet 0 Not Taking at Unknown time  . citalopram (CELEXA) 20 MG tablet Take 1 tablet (20 mg total) by mouth daily. (Patient not taking: Reported on 11/07/2015) 30 tablet 0 Not Taking at Unknown time  . citalopram (CELEXA) 20 MG tablet Take 1 tablet (20 mg total) by mouth daily. (Patient not taking: Reported on 11/07/2015) 14 tablet 0 Not Taking at Unknown time  . hydrALAZINE (APRESOLINE) 25 MG tablet Take 1 tablet (25 mg total) by mouth daily. (Patient not taking: Reported on 11/07/2015)   Not Taking at Unknown time  . hydrALAZINE (APRESOLINE) 25 MG tablet Take 1 tablet (25 mg total) by mouth 3 (three)  times daily. (Patient not taking: Reported on 11/07/2015) 42 tablet 0 Not Taking at Unknown time  . ibuprofen (ADVIL,MOTRIN) 800 MG tablet Take 1 tablet (800 mg total) by mouth every 8 (eight) hours as needed for moderate pain. (Patient not taking: Reported on 11/07/2015) 30 tablet 0 Not Taking at Unknown time  . Misc. Devices (CANE) MISC Use to help with ambulation. (Patient not taking: Reported on 11/07/2015) 1 each 0 unknown at Unknown time  . naproxen (NAPROSYN) 500 MG tablet Take 1 tablet (500 mg total) by mouth 2 (two) times daily with a meal. (Patient not taking: Reported on 11/07/2015) 30 tablet 0 Not Taking at Unknown time  . oxyCODONE  (ROXICODONE) 5 MG immediate release tablet Take 1 tablet (5 mg total) by mouth every 6 (six) hours as needed for severe pain. (Patient not taking: Reported on 11/07/2015) 20 tablet 0 Not Taking at Unknown time  . predniSONE (DELTASONE) 20 MG tablet Take 2 tablets (40 mg total) by mouth daily. Take 40 mg by mouth daily for 3 days, then 20mg  by mouth daily for 3 days, then 10mg  daily for 3 days (Patient not taking: Reported on 11/07/2015) 12 tablet 0 Not Taking at Unknown time  . traMADol (ULTRAM) 50 MG tablet Take 1 tablet (50 mg total) by mouth every 6 (six) hours as needed. (Patient not taking: Reported on 11/07/2015) 10 tablet 0 Not Taking at Unknown time  . traMADol (ULTRAM) 50 MG tablet Take 1 tablet (50 mg total) by mouth every 6 (six) hours as needed. (Patient not taking: Reported on 11/07/2015) 8 tablet 0 Not Taking at Unknown time    Treatment Modalities: Medication Management, Group therapy, Case management,  1 to 1 session with clinician, Psychoeducation, Recreational therapy.   Physician Treatment Plan for Primary Diagnosis: Alcohol use disorder, severe, dependence ([place]) Long Term Goal(s): Improvement in symptoms so as ready for discharge  Short Term Goals: Ability to maintain clinical measurements within normal limits will improve and Compliance with prescribed medications will improve  Medication Management: Evaluate patient's response, side effects, and tolerance of medication regimen.  Therapeutic Interventions: 1 to 1 sessions, Unit Group sessions and Medication administration.  Evaluation of Outcomes: Progressing  Physician Treatment Plan for Secondary Diagnosis: Principal Problem:   Alcohol use disorder, severe, dependence ([place]) Active Problems:   Severe alcohol use disorder ([place])   Long Term Goal(s): Improvement in symptoms so as ready for discharge  Short Term Goals: Ability to identify changes in lifestyle to reduce recurrence of condition will improve, Ability to  demonstrate self-control will improve and Ability to identify triggers associated with substance abuse/mental health issues will improve  Medication Management: Evaluate patient's response, side effects, and tolerance of medication regimen.  Therapeutic Interventions: 1 to 1 sessions, Unit Group sessions and Medication administration.  Evaluation of Outcomes: Progressing   RN Treatment Plan for Primary Diagnosis: Alcohol use disorder, severe, dependence ([place]) Long Term Goal(s): Knowledge of disease and therapeutic regimen to maintain health will improve  Short Term Goals: Ability to remain free from injury will improve and Ability to demonstrate self-control  Medication Management: RN will administer medications as ordered by provider, will assess and evaluate patient's response and provide education to patient for prescribed medication. RN will report any adverse and/or side effects to prescribing provider.  Therapeutic Interventions: 1 on 1 counseling sessions, Psychoeducation, Medication administration, Evaluate responses to treatment, Monitor vital signs and CBGs as ordered, Perform/monitor CIWA, COWS, AIMS and Fall Risk screenings as ordered, Perform wound care treatments  as ordered.  Evaluation of Outcomes: Progressing   LCSW Treatment Plan for Primary Diagnosis: Alcohol use disorder, severe, dependence ([place]) Long Term Goal(s): Safe transition to appropriate next level of care at discharge, Engage patient in therapeutic group addressing interpersonal concerns.  Short Term Goals: Engage patient in aftercare planning with referrals and resources and Increase social support  Therapeutic Interventions: Assess for all discharge needs, 1 to 1 time with Social worker, Explore available resources and support systems, Assess for adequacy in community support network, Educate family and significant other(s) on suicide prevention, Complete Psychosocial Assessment, Interpersonal group  therapy.  Evaluation of Outcomes: Progressing   Progress in Treatment: Attending groups: Yes Participating in groups: Yes Taking medication as prescribed: Yes, MD continues to assess for medication changes as needed Toleration medication: Yes, no side effects reported at this time Family/Significant other contact made:  Patient understands diagnosis:  Discussing patient identified problems/goals with staff: Yes Medical problems stabilized or resolved: Yes Denies suicidal/homicidal ideation:  Issues/concerns per patient self-inventory: None Other: N/A  New problem(s) identified: None identified at this time.   New Short Term/Long Term Goal(s): None identified at this time.   Discharge Plan or Barriers: LCSW is actively working on after care plan for patient--pt declined at [facility] due to insurance. No [facility] bed available. [facility] referral being made today 11/15/2015 by Edwyna Shell, LCSW.   Reason for Continuation of Hospitalization:   Depression Medication stabilization Withdrawal symptoms  Estimated Length of Stay: 1-3 days   Attendees: Patient: Bruce Hayes 11/15/2015  12:47 PM  Physician: Army Chaco, MD 11/15/2015  12:47 PM  Nursing: Coralyn Mark RN; Pauline Aus RN 11/15/2015  12:47 PM  RN Care Manager: Anderson Malta, Agency 11/15/2015  12:47 PM  Social Worker: Maxie Better, LCSW 11/15/2015  12:47 PM  Recreational Therapist:  11/15/2015  12:47 PM  Other:  11/15/2015  12:47 PM  Other:  11/15/2015  12:47 PM  Other: 11/15/2015  12:47 PM    Scribe for Treatment Team: Maxie Better, MSW, LCSW Clinical Social Worker 11/15/2015 12:48 PM

## 2015-11-16 NOTE — Progress Notes (Signed)
DAR NOTE: Patient presents with anxious affect and depressed mood.  Denies pain, auditory and visual hallucinations.  Rates depression at 6, hopelessness at 4, and anxiety at 4.  Maintained on routine safety checks.  Medications given as prescribed.  Support and encouragement offered as needed.  Attended group and participated.  States goal for today is " my health".  Patient observed socializing with peers in the dayroom.  Offered no complaint.

## 2015-11-16 NOTE — Clinical Social Work Note (Signed)
Anderson Malta at [place] will review and contact CSW Smart w [facility] Josem Kaufmann #.    Beverely Pace, LCSW 11/16/2015, 10:51 AM

## 2015-11-16 NOTE — BHH Group Notes (Signed)
[facility] LCSW Group Therapy  11/16/2015 1:35 PM  Type of Therapy:  Group Therapy  Participation Level:  Active  Participation Quality:  Attentive  Affect:  Appropriate  Cognitive:  Alert and Oriented  Insight:  Engaged  Engagement in Therapy:  Improving  Modes of Intervention:  Discussion, Education, Exploration, Problem-solving, Rapport Building, Socialization and Support  Summary of Progress/Problems: [facility] Speaker came to talk about his personal journey with substance abuse and addiction. The pt processed ways by which to relate to the speaker. [place] speaker provided handouts and educational information pertaining to groups and services offered by the [facility].   Sheral Pfahler N Smart LCSW 11/16/2015, 1:35 PM

## 2015-11-16 NOTE — Plan of Care (Signed)
Problem: Self-Concept: Goal: Level of anxiety will decrease Outcome: Progressing Level of anxiety will decrease AEB verbalizing Anxiety "4" of 10, "feel better now that we met with SW and she trying help [place] find a program." "I want to go a long term treatment program"

## 2015-11-16 NOTE — Progress Notes (Signed)
[facility] Auth received: [id number] for today, 11/16/15. Faxed to [facility] admissions 11/16/2015. CSW also spoke to Neoma Laming from the [facility] who gave CSW contact number to [place]: [phone]. 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 CSW to share this information with patient after lunch. Pt is requesting TB test.   Maxie Better, MSW, LCSW Clinical Social Worker 11/16/2015 12:21 PM

## 2015-11-16 NOTE — Progress Notes (Signed)
[facility] MD Progress Note  11/16/2015 9:17 AM  Patient Active Problem List   Diagnosis Date Noted  . Severe alcohol use disorder ([place]) 11/11/2015  . Alcohol use disorder, severe, dependence ([place]) 11/08/2015  . Alcohol abuse 12/07/2013  . Cocaine abuse 12/07/2013  . Polysubstance abuse 12/07/2013  . Alcohol use disorder, moderate, dependence ([place]) 12/07/2013  . Depression   . Right radial head fracture 11/30/2011  . Acute renal failure ([place]) 11/28/2011  . Hyperkalemia 11/28/2011  . CELLULITIS AND ABSCESS OF UNSPECIFIED SITE 12/21/2008  . HEEL PAIN, LEFT 12/21/2008  . 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 RENAL INSUFFICIENCY, ACUTE 09/06/2007  . HEPATITIS B CARRIER 07/16/2007  . UNSPECIFIED DISORDER OF LIVER 07/03/2007  . HEPATITIS C 05/22/2007  . HYPERLIPIDEMIA 05/22/2007  . OBESITY 05/22/2007  . THROMBOCYTOPENIA 05/22/2007  . TOBACCO ABUSE 05/22/2007  . HYPERTENSION 05/22/2007  . COCAINE ABUSE, HX OF 05/22/2007    Diagnosis: alcohol use disorder, severe  Subjective: patient reports he did not sleep well for some reason yesterday night. Currently he is lying in bed sleeping when I come to speak to him but he is readily arousable. He reports having a hopeful attitude today and acknowledges that he has had a tendency to becomea little too discouraged and irritable over setbacks recently. He is still committed to further treatment for his alcohol use disorder.  He denies any acute suicidal or homicidal ideation, plan or intent today.  Objective: well-developed well-nourished man lying in bed resting comfortably sleeping originally but easily arousable he is pleasant and appropriate mood is described as alright and affect is congruent thought processes are linear and goal-directed thought content denies any current suicidal or homicidal ideation, plan or intent. Alert and oriented 3 IQ appears an average range insight and judgment are  fair     Current Facility-Administered Medications (Cardiovascular):  .  amLODipine (NORVASC) tablet 10 mg .  hydrALAZINE (APRESOLINE) tablet 50 mg     Current Facility-Administered Medications (Analgesics):  .  ibuprofen (ADVIL,MOTRIN) tablet 600 mg     Current Facility-Administered Medications (Other):  .  alum & mag hydroxide-simeth (MAALOX/MYLANTA) 200-200-20 MG/5ML suspension 30 mL .  hydrOXYzine (ATARAX/VISTARIL) tablet 25 mg .  Influenza vac split quadrivalent PF (FLUARIX) injection 0.5 mL .  magnesium hydroxide (MILK OF MAGNESIA) suspension 30 mL .  multivitamin with minerals tablet 1 tablet .  pneumococcal 23 valent vaccine (PNU-IMMUNE) injection 0.5 mL .  thiamine (VITAMIN B-1) tablet 100 mg .  traZODone (DESYREL) tablet 50 mg  No current outpatient prescriptions on file.  Vital Signs:Blood pressure (!) 165/67, pulse 89, temperature 98.6 F (37 C), temperature source Oral, resp. rate 16, height 5\' 11"  (1.803 m), weight 97.5 kg (215 lb), SpO2 100 %.    Lab Results: No results found for this or any previous visit (from the past 48 hour(s)).  Physical Findings: AIMS: Facial and Oral Movements Muscles of Facial Expression: None, normal Lips and Perioral Area: None, normal Jaw: None, normal Tongue: None, normal,Extremity Movements Upper (arms, wrists, hands, fingers): None, normal Lower (legs, knees, ankles, toes): None, normal, Trunk Movements Neck, shoulders, hips: None, normal, Overall Severity Severity of abnormal movements (highest score from questions above): None, normal Incapacitation due to abnormal movements: None, normal Patient's awareness of abnormal movements (rate only patient's report): No Awareness, Dental Status Current problems with teeth and/or dentures?: No Does patient usually wear dentures?: No  CIWA:  CIWA-Ar Total: 0 COWS:      Assessment/Plan: patient appears to  be having a positive mood today although as he acknowledges this can  change. He does remain committed to continuing treatment for his alcohol use disorder. Blood pressure continues to show the wide gap between systolic and diastolic we will continue to monitor.  [name], MD 11/16/2015, 9:17 AM

## 2015-11-16 NOTE — Progress Notes (Signed)
Recreation Therapy Notes    Animal-Assisted Activity (AAA) Program Checklist/Progress Notes Patient Eligibility Criteria Checklist & Daily Group note for Rec TxIntervention  Date: 10.17.2017 Time: 2:45pm Location: [address]    AAA/T Program Assumption of Risk Form signed by Patient/ or Parent Legal Guardian Yes  Patient is free of allergies or sever asthma Yes  Patient reports no fear of animals Yes  Patient reports no history of cruelty to animals Yes  Patient understands his/her participation is voluntary Yes  Behavioral Response: Did not attend.   Laureen Ochs Youa Deloney, LRT/CTRS        Tirsa Gail L 11/16/2015 2:58 PM

## 2015-11-17 MED ORDER — HYDRALAZINE HCL 50 MG PO TABS: 50.0000 mg | ORAL_TABLET | Freq: Two times a day (BID) | ORAL | 0 refills | Status: DC

## 2015-11-17 MED ORDER — HYDROXYZINE HCL 25 MG PO TABS: 25.0000 mg | ORAL_TABLET | Freq: Four times a day (QID) | ORAL | 0 refills | Status: DC | PRN

## 2015-11-17 MED ORDER — TUBERCULIN PPD 5 UNIT/0.1ML ID SOLN
5.0000 [IU] | Freq: Once | INTRADERMAL | Status: DC
  Administered 2015-11-17: 5 [IU] via INTRADERMAL

## 2015-11-17 MED ORDER — TRAZODONE HCL 50 MG PO TABS: 50.0000 mg | ORAL_TABLET | Freq: Every evening | ORAL | 0 refills | Status: DC | PRN

## 2015-11-17 MED ORDER — AMLODIPINE BESYLATE 10 MG PO TABS: 10.0000 mg | ORAL_TABLET | Freq: Every day | ORAL | 0 refills | Status: DC

## 2015-11-17 NOTE — Progress Notes (Signed)
Pt attended NA meeting this evening.  
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
## 2015-11-17 NOTE — Progress Notes (Signed)
PPD placed on the pt left forearm on 11/17/15 at 1527, please read after 48 hrs.

## 2015-11-17 NOTE — Progress Notes (Addendum)
D: Pt was in in the day room upon initial approach.  Pt presents with anxious affect and mood.  Pt reports "I got good news from the social worker today, they're trying to get me in the [facility]."  Pt reports his goal was to work on his aftercare plan today.  Pt denies SI/HI, denies hallucinations, reports bilateral leg pain of 8/10.  Pt states "I feel more positive than when I first got here."  Pt has been visible in milieu interacting with peers and staff appropriately.  Pt attended evening group.    A: Introduced self to pt.  Actively listened to pt and offered support and encouragement.  PRN medication administered for anxiety, sleep, and pain.  R: Pt is safe on the unit.  Pt is compliant with medications.  Pt verbally contracts for safety.  Will continue to monitor and assess.

## 2015-11-17 NOTE — Progress Notes (Signed)
CSW spoke with Bruce Hayes for [facility] who will email packet for patient to complete to begin referral process to the [facility]. CSW also spoke with Bruce Hayes at [facility] who stated that may be able to accept patient for Thursday morning if he can arrange transportation. Pt and Dr. Sharolyn Douglas have been made aware of above.  Maxie Better, MSW, LCSW Clinical Social Worker 11/17/2015 12:01 PM

## 2015-11-17 NOTE — Progress Notes (Signed)
[facility] MD Progress Note  11/17/2015 2:29 PM  Patient Active Problem List   Diagnosis Date Noted  . Severe alcohol use disorder ([place]) 11/11/2015  . Alcohol use disorder, severe, dependence ([place]) 11/08/2015  . Alcohol abuse 12/07/2013  . Cocaine abuse 12/07/2013  . Polysubstance abuse 12/07/2013  . Alcohol use disorder, moderate, dependence ([place]) 12/07/2013  . Depression   . Right radial head fracture 11/30/2011  . Acute renal failure ([place]) 11/28/2011  . Hyperkalemia 11/28/2011  . CELLULITIS AND ABSCESS OF UNSPECIFIED SITE 12/21/2008  . HEEL PAIN, LEFT 12/21/2008  . 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 RENAL INSUFFICIENCY, ACUTE 09/06/2007  . HEPATITIS B CARRIER 07/16/2007  . UNSPECIFIED DISORDER OF LIVER 07/03/2007  . HEPATITIS C 05/22/2007  . HYPERLIPIDEMIA 05/22/2007  . OBESITY 05/22/2007  . THROMBOCYTOPENIA 05/22/2007  . TOBACCO ABUSE 05/22/2007  . HYPERTENSION 05/22/2007  . COCAINE ABUSE, HX OF 05/22/2007    Diagnosis: Alcohol use disorder severe  Subjective: Patient may be able to go to [facility] tomorrow. He currently does appear to be accepting of this disposition. Denies any suicidal or homicidal ideation, plan or intent. Request TB test for possible placement later on.  Objective: Well developed well nourished man in no apparent distress pleasant and cooperative speech and motor within normal limits thought processes linear and goal-directed thought content denies any suicidal or homicidal ideation, plan or intent alert and oriented 3 IQ appears an average range insight and judgment are fair     Current Facility-Administered Medications (Cardiovascular):  .  amLODipine (NORVASC) tablet 10 mg .  hydrALAZINE (APRESOLINE) tablet 50 mg     Current Facility-Administered Medications (Analgesics):  .  ibuprofen (ADVIL,MOTRIN) tablet 600 mg     Current Facility-Administered Medications (Other):  .  alum &  mag hydroxide-simeth (MAALOX/MYLANTA) 200-200-20 MG/5ML suspension 30 mL .  hydrOXYzine (ATARAX/VISTARIL) tablet 25 mg .  Influenza vac split quadrivalent PF (FLUARIX) injection 0.5 mL .  magnesium hydroxide (MILK OF MAGNESIA) suspension 30 mL .  multivitamin with minerals tablet 1 tablet .  pneumococcal 23 valent vaccine (PNU-IMMUNE) injection 0.5 mL .  thiamine (VITAMIN B-1) tablet 100 mg .  traZODone (DESYREL) tablet 50 mg .  tuberculin injection 5 Units  No current outpatient prescriptions on file.  Vital Signs:Blood pressure (!) 168/70, pulse 89, temperature 98.7 F (37.1 C), temperature source Oral, resp. rate 18, height 5\' 11"  (1.803 m), weight 97.5 kg (215 lb), SpO2 100 %.    Lab Results: No results found for this or any previous visit (from the past 48 hour(s)).  Physical Findings: AIMS: Facial and Oral Movements Muscles of Facial Expression: None, normal Lips and Perioral Area: None, normal Jaw: None, normal Tongue: None, normal,Extremity Movements Upper (arms, wrists, hands, fingers): None, normal Lower (legs, knees, ankles, toes): None, normal, Trunk Movements Neck, shoulders, hips: None, normal, Overall Severity Severity of abnormal movements (highest score from questions above): None, normal Incapacitation due to abnormal movements: None, normal Patient's awareness of abnormal movements (rate only patient's report): No Awareness, Dental Status Current problems with teeth and/or dentures?: No Does patient usually wear dentures?: No  CIWA:  CIWA-Ar Total: 1 COWS:      Assessment/Plan: Patient had a lot of anxiety about his placement but appears to be accepting his current one. He does have a place to stay and if he declines offered placements repeatedly he will need to return to that place. We will order TB test for possible placement after [facility].  [name], MD 11/17/2015, 2:29 PM

## 2015-11-17 NOTE — Progress Notes (Signed)
Recreation Therapy Notes  Date: 11/17/15 Time: 0930 Location: 300 Hall Dayroom  Group Topic: Stress Management  Goal Area(s) Addresses:  Patient will verbalize importance of using healthy stress management.  Patient will identify positive emotions associated with healthy stress management.   Behavioral Response: Engaged  Intervention: Stress Management  Activity :  Progressive Muscle Relaxation.  LRT introduced the stress management technique of progressive muscle relaxation.  LRT read a script to allow patients to participate in activity.  Patients were to follow along as LRT read script.  Education:  Stress Management, Discharge Planning.   Education Outcome: Acknowledges edcuation/In group clarification offered/Needs additional education  Clinical Observations/Feedback: Pt attended group.    Victorino Sparrow, LRT/CTRS    Ria Comment, Cyann Venti A 11/17/2015 2:26 PM

## 2015-11-17 NOTE — Progress Notes (Signed)
DAR NOTE: Pt present with flat affect and depressed mood in the unit. Pt has been isolating himself and has been bed most of the time. Pt denies physical pain, took all his meds as scheduled. As per self inventory, pt had a good night sleep, good appetite, normal energy, and good concentration. Goal is  " my health and exit plan.' Pt rate depression at 4, hopeless ness at 2, and anxiety at 3. Pt's safety ensured with 15 minute and environmental checks. Pt currently denies SI/HI and A/V hallucinations. Pt verbally agrees to seek staff if SI/HI or A/VH occurs and to consult with staff before acting on these thoughts. Will continue POC.

## 2015-11-17 NOTE — Tx Team (Signed)
Interdisciplinary Treatment and Diagnostic Plan Update  11/17/2015 Time of Session: 4:13 PM  Bruce Hayes MRN: [id number]  Principal Diagnosis: Alcohol use disorder, severe, dependence ([place])  Secondary Diagnoses: Principal Problem:   Alcohol use disorder, severe, dependence ([place]) Active Problems:   Severe alcohol use disorder ([place])   Current Medications:  Current Facility-Administered Medications  Medication Dose Route Frequency Provider Last Rate Last Dose  . alum & mag hydroxide-simeth (MAALOX/MYLANTA) 200-200-20 MG/5ML suspension 30 mL  30 mL Oral Q4H PRN Ethelene Hal, NP      . amLODipine (NORVASC) tablet 10 mg  10 mg Oral Daily Ethelene Hal, NP   10 mg at 11/17/15 4431  . hydrALAZINE (APRESOLINE) tablet 50 mg  50 mg Oral BID Derrill Center, NP   50 mg at 11/17/15 5400  . hydrOXYzine (ATARAX/VISTARIL) tablet 25 mg  25 mg Oral Q6H PRN Ethelene Hal, NP   25 mg at 11/16/15 2152  . ibuprofen (ADVIL,MOTRIN) tablet 600 mg  600 mg Oral Q6H PRN Niel Hummer, NP   600 mg at 11/16/15 2152  . Influenza vac split quadrivalent PF (FLUARIX) injection 0.5 mL  0.5 mL Intramuscular Tomorrow-1000 [name], MD      . magnesium hydroxide (MILK OF MAGNESIA) suspension 30 mL  30 mL Oral Daily PRN Ethelene Hal, NP      . multivitamin with minerals tablet 1 tablet  1 tablet Oral Daily [name], PA-C   1 tablet at 11/17/15 [phone]  . pneumococcal 23 valent vaccine (PNU-IMMUNE) injection 0.5 mL  0.5 mL Intramuscular Tomorrow-1000 [name], MD      . thiamine (VITAMIN B-1) tablet 100 mg  100 mg Oral Daily Ethelene Hal, NP   100 mg at 11/17/15 1950  . traZODone (DESYREL) tablet 50 mg  50 mg Oral QHS PRN Ethelene Hal, NP   50 mg at 11/16/15 2305  . tuberculin injection 5 Units  5 Units Intradermal Once [name], MD   5 Units at 11/17/15 1527    PTA Medications: Prescriptions Prior to Admission  Medication Sig  Dispense Refill Last Dose  . amLODipine (NORVASC) 10 MG tablet Take 1 tablet (10 mg total) by mouth daily. 30 tablet 0   . aspirin EC 325 MG tablet Take 1 tablet (325 mg total) by mouth daily. (Patient not taking: Reported on 11/07/2015) 30 tablet 0 Not Taking at Unknown time  . atorvastatin (LIPITOR) 10 MG tablet Take 1 tablet (10 mg total) by mouth daily. For increased cholesterol (Patient not taking: Reported on 11/07/2015) 30 tablet 3 Not Taking at Unknown time  . cephALEXin (KEFLEX) 500 MG capsule Take 1 capsule (500 mg total) by mouth 4 (four) times daily. (Patient not taking: Reported on 11/07/2015) 28 capsule 0 Not Taking at Unknown time  . chlorthalidone (HYGROTON) 25 MG tablet Take 1 tablet (25 mg total) by mouth daily. (Patient not taking: Reported on 11/07/2015) 14 tablet 0 Not Taking at Unknown time  . citalopram (CELEXA) 20 MG tablet Take 1 tablet (20 mg total) by mouth daily. (Patient not taking: Reported on 11/07/2015) 30 tablet 0 Not Taking at Unknown time  . citalopram (CELEXA) 20 MG tablet Take 1 tablet (20 mg total) by mouth daily. (Patient not taking: Reported on 11/07/2015) 14 tablet 0 Not Taking at Unknown time  . hydrALAZINE (APRESOLINE) 25 MG tablet Take 1 tablet (25 mg total) by mouth daily. (Patient not taking: Reported on 11/07/2015)  Not Taking at Unknown time  . hydrALAZINE (APRESOLINE) 25 MG tablet Take 1 tablet (25 mg total) by mouth 3 (three) times daily. (Patient not taking: Reported on 11/07/2015) 42 tablet 0 Not Taking at Unknown time  . ibuprofen (ADVIL,MOTRIN) 800 MG tablet Take 1 tablet (800 mg total) by mouth every 8 (eight) hours as needed for moderate pain. (Patient not taking: Reported on 11/07/2015) 30 tablet 0 Not Taking at Unknown time  . Misc. Devices (CANE) MISC Use to help with ambulation. (Patient not taking: Reported on 11/07/2015) 1 each 0 unknown at Unknown time  . naproxen (NAPROSYN) 500 MG tablet Take 1 tablet (500 mg total) by mouth 2 (two) times daily with  a meal. (Patient not taking: Reported on 11/07/2015) 30 tablet 0 Not Taking at Unknown time  . oxyCODONE (ROXICODONE) 5 MG immediate release tablet Take 1 tablet (5 mg total) by mouth every 6 (six) hours as needed for severe pain. (Patient not taking: Reported on 11/07/2015) 20 tablet 0 Not Taking at Unknown time  . predniSONE (DELTASONE) 20 MG tablet Take 2 tablets (40 mg total) by mouth daily. Take 40 mg by mouth daily for 3 days, then 20mg  by mouth daily for 3 days, then 10mg  daily for 3 days (Patient not taking: Reported on 11/07/2015) 12 tablet 0 Not Taking at Unknown time  . traMADol (ULTRAM) 50 MG tablet Take 1 tablet (50 mg total) by mouth every 6 (six) hours as needed. (Patient not taking: Reported on 11/07/2015) 10 tablet 0 Not Taking at Unknown time  . traMADol (ULTRAM) 50 MG tablet Take 1 tablet (50 mg total) by mouth every 6 (six) hours as needed. (Patient not taking: Reported on 11/07/2015) 8 tablet 0 Not Taking at Unknown time    Treatment Modalities: Medication Management, Group therapy, Case management,  1 to 1 session with clinician, Psychoeducation, Recreational therapy.   Physician Treatment Plan for Primary Diagnosis: Alcohol use disorder, severe, dependence ([place]) Long Term Goal(s): Improvement in symptoms so as ready for discharge  Short Term Goals: Ability to maintain clinical measurements within normal limits will improve and Compliance with prescribed medications will improve  Medication Management: Evaluate patient's response, side effects, and tolerance of medication regimen.  Therapeutic Interventions: 1 to 1 sessions, Unit Group sessions and Medication administration.  Evaluation of Outcomes: Adequate for discharge to [place] for Secondary Diagnosis: Principal Problem:   Alcohol use disorder, severe, dependence ([place]) Active Problems:   Severe alcohol use disorder ([place])   Long Term Goal(s): Improvement in symptoms so as ready for  discharge  Short Term Goals: Ability to identify changes in lifestyle to reduce recurrence of condition will improve, Ability to demonstrate self-control will improve and Ability to identify triggers associated with substance abuse/mental health issues will improve  Medication Management: Evaluate patient's response, side effects, and tolerance of medication regimen.  Therapeutic Interventions: 1 to 1 sessions, Unit Group sessions and Medication administration.  Evaluation of Outcomes: Adequate for discharge to [facility].    RN Treatment Plan for Primary Diagnosis: Alcohol use disorder, severe, dependence ([place]) Long Term Goal(s): Knowledge of disease and therapeutic regimen to maintain health will improve  Short Term Goals: Ability to remain free from injury will improve and Ability to demonstrate self-control  Medication Management: RN will administer medications as ordered by provider, will assess and evaluate patient's response and provide education to patient for prescribed medication. RN will report any adverse and/or side effects to prescribing provider.  Therapeutic Interventions: 1 on  1 counseling sessions, Psychoeducation, Medication administration, Evaluate responses to treatment, Monitor vital signs and CBGs as ordered, Perform/monitor CIWA, COWS, AIMS and Fall Risk screenings as ordered, Perform wound care treatments as ordered.  Evaluation of Outcomes: Adequate for discharge to [place].   LCSW Treatment Plan for Primary Diagnosis: Alcohol use disorder, severe, dependence ([place]) Long Term Goal(s): Safe transition to appropriate next level of care at discharge, Engage patient in therapeutic group addressing interpersonal concerns.  Short Term Goals: Engage patient in aftercare planning with referrals and resources and Increase social support  Therapeutic Interventions: Assess for all discharge needs, 1 to 1 time with Social worker, Explore available resources and support systems,  Assess for adequacy in community support network, Educate family and significant other(s) on suicide prevention, Complete Psychosocial Assessment, Interpersonal group therapy.  Evaluation of Outcomes: Adequate for discharge to [facility]   Progress in Treatment: Attending groups: Yes Participating in groups: Yes Taking medication as prescribed: Yes, MD continues to assess for medication changes as needed Toleration medication: Yes, no side effects reported at this time Family/Significant other contact made:  Patient understands diagnosis:  Discussing patient identified problems/goals with staff: Yes Medical problems stabilized or resolved: Yes Denies suicidal/homicidal ideation:  Issues/concerns per patient self-inventory: None Other: N/A  New problem(s) identified: None identified at this time.   New Short Term/Long Term Goal(s): None identified at this time.   Discharge Plan or Barriers: Pt has been accepted to [facility] for Thursday 11/18/15 by Juliann Pulse in admissions.   Reason for Continuation of Hospitalization:   none  Estimated Length of Stay: 1 day-discharge scheduled for Thursday morning to [facility]. [facility] to transport, as pt has been IVCed.   Attendees: Patient:  11/17/2015  4:13 PM  Physician: Army Chaco, MD 11/17/2015  4:13 PM  Nursing: Chestine Spore RN 11/17/2015  4:13 PM  RN Care Manager: Anderson Malta, [place] 11/17/2015  4:13 PM  Social Worker: Maxie Better, LCSW 11/17/2015  4:13 PM  Recreational Therapist:  11/17/2015  4:13 PM  Other:  11/17/2015  4:13 PM  Other:  11/17/2015  4:13 PM  Other: 11/17/2015  4:13 PM    Scribe for Treatment Team: Maxie Better, MSW, LCSW Clinical Social Worker 11/17/2015 4:13 PM

## 2015-11-17 NOTE — Plan of Care (Signed)
Problem: Activity: Goal: Imbalance in normal sleep/wake cycle will improve Outcome: Not Progressing Pt reports he did not sleep well last night.  He reports he didn't fall asleep until 0300.

## 2015-11-17 NOTE — BHH Group Notes (Signed)
[place] LCSW Group Therapy  11/17/2015 3:48 PM  Type of Therapy:  Group Therapy  Participation Level:  Active  Participation Quality:  Attentive  Affect:  Depressed and Irritable  Cognitive:  Oriented  Insight:  Improving  Engagement in Therapy:  Improving  Modes of Intervention:  Discussion, Education, Exploration, Limit-setting, Problem-solving, Rapport Building, Socialization and Support  Summary of Progress/Problems: Today's Topic: Overcoming Obstacles. Patients identified one short term goal and potential obstacles in reaching this goal. Patients processed barriers involved in overcoming these obstacles. Patients identified steps necessary for overcoming these obstacles and explored motivation (internal and external) for facing these difficulties head on. Bruce Hayes was attentive and engaged during today's processing group. He shared that his biggest obstacle is "my mood is just so up and down. I'm really getting frustrated." He shared his struggles with finding placement but stated that he feels hopeful in working with [place] and [place] that he will be able to get to [facility] and eventually to the [facility]. He continues to show progress in the group setting with improving insight.   Bruce Hayes N Smart LCSW 11/17/2015, 3:48 PM

## 2015-11-17 NOTE — Progress Notes (Signed)
  [facility] Adult Case Management Discharge Plan :  Will you be returning to the same living situation after discharge:  No. Pt accepted to [facility] for Thursday morning.  At discharge, do you have transportation home?: Yes,  pt has been [facility] for safe transport and will be taken by [facility] to [facility] on Thursday morning. Do you have the ability to pay for your medications: Yes,  managed medicare  Release of information consent forms completed and submitted to medical records by CSW. Patient to Follow up at: Follow-up Information    [facility] .   Why:  You have been accepted for admission on Thursday, 11/18/15. [facility] will transport you to facility on Thursday morning. Thank you. (Please remember to have them read you TB test results within 48 hours of getting test).  Contact information: [address] [place], [place] [address] Phone: [phone] Fax: [place] .   Why:  Please follow-up with Monique while at [facility] regarding referral for the [facility]. Please make sure [facility] faxes her your TB test results. Thank you.  Contact information: ATTN: Eulah Pont (Per [employer]) [address] [place], [place] [address] Phone: [phone] ext [phone] Fax: [phone]          Next level of care provider has access to [place] and Suicide Prevention discussed: Yes,  SPE completed with pt; pt declined to consent to family contact.  Have you used any form of tobacco in the last 30 days? (Cigarettes, Smokeless Tobacco, Cigars, and/or Pipes): No  Has patient been referred to the Quitline?: Pt is nonsmoker  Patient has been referred for addiction treatment: Yes  Bruce Hayes Smart  LCSW 11/17/2015, 4:12 PM

## 2015-11-17 NOTE — Progress Notes (Signed)
CSW spoke with Juliann Pulse in admissions at [facility]--she is still working on referral and cannot confirm bed availability for [facility] at this time. She will try to call before 5pm today.  Maxie Better, MSW, LCSW Clinical Social Worker 11/17/2015 3:05 PM

## 2015-11-17 NOTE — Progress Notes (Signed)
PT HAS BEEN ACCEPTED TO [facility] FOR TOMORROW BY KATHY IN ADMISSIONS. PT HAS BEEN IVCED FOR SAFE TRANSPORT BY DR. Sharolyn Douglas AND PAPERWORK HAS BEEN FAXED AND RECEIVED BY THE [facility]. CSW CONTACTED SGT. PASCAL WHO WILL PICK UP PAPERWORK AND WILL HAVE AN OFFICER PICK UP PATIENT BETWEEN 8-10AM TOMORROW, 11/18/15. CSW SPOKE WITH PT WHO IS AWARE OF ABOVE AND AGREEABLE TO TREATMENT AT [facility]. MAY AUGUSTIN (NP) NOTIFIED OF NEED FOR DISCHARGE SUMMARY AND DR. Sharolyn Douglas IS AWARE THAT SRA MUST BE COMPLETED TODAY DUE TO EARLY DISCHARGE.   Bruce Hayes, MSW, LCSW Clinical Social Worker 11/17/2015 4:05 PM

## 2015-11-17 NOTE — Progress Notes (Signed)
Patient ID: Bruce Hayes, male   DOB: Aug 31, 1945, 70 y.o.   MRN: [id number] D: Client visible on the unit, interacts with peers appropriately. Client reports talk to the SW and she will be getting in touch with the [place]. Client will be going to [facility] tomorrow "I feel good about it" A: Write provided emotional support, reviewed medications, administered as ordered. Staff will monitor q24min for safety. R: client is safe on the unit, attended group.

## 2015-11-18 NOTE — Progress Notes (Signed)
Patient ID: Bruce Hayes, male   DOB: 1945/08/13, 70 y.o.   MRN: [id number]  DAR: Pt. Denies SI/HI and A/V Hallucinations. He reports sleep is good, appetite is good, energy level is normal, and concentration is good. He rates depression 2/10, hopelessness 3/10, and anxiety 3/10. He does admit to having some cravings but otherwise no withdrawal symptoms. Patient does not report any pain or discomfort at this time. Support and encouragement provided to the patient to come to writer with questions or concerns before discharge. Patient is receptive and cooperative. He is seen sitting in the dayroom interacting with peers this morning. He is pleasant and appears to have a positive attitude about discharge. Q15 minute checks are maintained for safety.

## 2015-11-18 NOTE — Progress Notes (Signed)
Patient ID: Bruce Hayes, male   DOB: [birth year], 70 y.o.   MRN: [id number]  Discharge Note- Belongings returned to patient at time of discharge. Patient denies any pain or discomfort. Discharge instructions and medications were reviewed with patient. Patient verbalized understanding of both medications and discharge instructions. No distress noted upon discharge. Q15 minute safety checks maintained until time of discharge. Patient was discharged to lobby where the sheriff was waiting for transport.

## 2015-11-18 NOTE — BHH Group Notes (Signed)
Goals Group Date:  11/18/2015  Time:  0900  Type of Therapy:  Nurse Education  Goals group:  The group is focused on teaching patients how  To set attainable goals that will aid them in their recovery.  Participation Level:  Did Not Attend  Participation Quality:  N/A  Affect:  N/A  Cognitive:  N/A  Insight:  N/A  Engagement in Group:  N/A  Modes of Intervention: N/A   Summary of Progress/Problems:  Lauralyn Primes 11/18/2015, 10:42 AM

## 2016-02-08 ENCOUNTER — Emergency Department (HOSPITAL_COMMUNITY)
Admission: EM | Admit: 2016-02-08 | Discharge: 2016-02-08 | Disposition: A | Discharge status: Home / Self Care | Payer: Medicare Other | Attending: Emergency Medicine | Admitting: Emergency Medicine

## 2016-02-08 ENCOUNTER — Encounter (HOSPITAL_COMMUNITY): Payer: Self-pay | Admitting: Emergency Medicine

## 2016-02-08 ENCOUNTER — Emergency Department (HOSPITAL_COMMUNITY): Payer: Medicare Other

## 2016-02-08 ENCOUNTER — Emergency Department (HOSPITAL_COMMUNITY): Admit: 2016-02-08 | Payer: Medicare Other

## 2016-02-08 DIAGNOSIS — I129 Hypertensive chronic kidney disease with stage 1 through stage 4 chronic kidney disease, or unspecified chronic kidney disease: Secondary | ICD-10-CM | POA: Insufficient documentation

## 2016-02-08 DIAGNOSIS — I2510 Atherosclerotic heart disease of native coronary artery without angina pectoris: Secondary | ICD-10-CM | POA: Diagnosis not present

## 2016-02-08 DIAGNOSIS — M79601 Pain in right arm: Secondary | ICD-10-CM

## 2016-02-08 DIAGNOSIS — F17210 Nicotine dependence, cigarettes, uncomplicated: Secondary | ICD-10-CM | POA: Insufficient documentation

## 2016-02-08 DIAGNOSIS — N189 Chronic kidney disease, unspecified: Secondary | ICD-10-CM | POA: Insufficient documentation

## 2016-02-08 MED ORDER — PREDNISONE 20 MG PO TABS: 60.0000 mg | ORAL_TABLET | ORAL | Status: DC

## 2016-02-08 MED ORDER — PREDNISONE 20 MG PO TABS: 40.0000 mg | ORAL_TABLET | Freq: Every day | ORAL | 0 refills | Status: DC

## 2016-02-08 MED ORDER — IBUPROFEN 400 MG PO TABS
ORAL_TABLET | ORAL | Status: DC
Start: 2016-02-08 — End: 2016-02-08
  Filled 2016-02-08: qty 1

## 2016-02-08 MED ORDER — IBUPROFEN 400 MG PO TABS
400.0000 mg | ORAL_TABLET | Freq: Once | ORAL | Status: AC | PRN
  Administered 2016-02-08: 400 mg via ORAL
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
## 2016-02-08 NOTE — Discharge Instructions (Signed)
As discussed, your evaluation today has been largely reassuring.  But, it is important that you monitor your condition carefully, and do not hesitate to return to the ED if you develop new, or concerning changes in your condition.  Your pain is likely due to gout.  It is important to take all medication as directed, and return here for concerning changes in your condition.

## 2016-02-08 NOTE — ED Triage Notes (Signed)
Pt to ER for evaluation of right forearm/wrist pain. Pt reports he went to sleep last night and "slept on his arm for a long time" and woke up this morning with swelling to right wrist and right arm pain. Denies injury or trauma. Pulses 2+ in bilateral wrists. Swelling noted.

## 2016-03-09 ENCOUNTER — Encounter (HOSPITAL_COMMUNITY): Payer: Self-pay | Admitting: Emergency Medicine

## 2016-03-09 ENCOUNTER — Emergency Department (HOSPITAL_COMMUNITY)
Admission: EM | Admit: 2016-03-09 | Discharge: 2016-03-09 | Disposition: A | Discharge status: Home / Self Care | Payer: Medicare Other | Attending: Emergency Medicine | Admitting: Emergency Medicine

## 2016-03-09 DIAGNOSIS — N189 Chronic kidney disease, unspecified: Secondary | ICD-10-CM | POA: Diagnosis not present

## 2016-03-09 DIAGNOSIS — I2510 Atherosclerotic heart disease of native coronary artery without angina pectoris: Secondary | ICD-10-CM | POA: Insufficient documentation

## 2016-03-09 DIAGNOSIS — Z760 Encounter for issue of repeat prescription: Secondary | ICD-10-CM | POA: Insufficient documentation

## 2016-03-09 DIAGNOSIS — Z79899 Other long term (current) drug therapy: Secondary | ICD-10-CM | POA: Insufficient documentation

## 2016-03-09 DIAGNOSIS — F17210 Nicotine dependence, cigarettes, uncomplicated: Secondary | ICD-10-CM | POA: Insufficient documentation

## 2016-03-09 DIAGNOSIS — I129 Hypertensive chronic kidney disease with stage 1 through stage 4 chronic kidney disease, or unspecified chronic kidney disease: Secondary | ICD-10-CM | POA: Diagnosis not present

## 2016-03-09 MED ORDER — HYDRALAZINE HCL 50 MG PO TABS: 50.0000 mg | ORAL_TABLET | Freq: Two times a day (BID) | ORAL | 0 refills | Status: DC

## 2016-03-09 MED ORDER — AMLODIPINE BESYLATE 10 MG PO TABS: 10.0000 mg | ORAL_TABLET | Freq: Every day | ORAL | 0 refills | Status: DC

## 2016-03-09 MED ORDER — AMLODIPINE BESYLATE 5 MG PO TABS
10.0000 mg | ORAL_TABLET | Freq: Once | ORAL | Status: AC
  Administered 2016-03-09: 10 mg via ORAL
  Filled 2016-03-09: qty 2

## 2016-03-09 MED ORDER — HYDROXYZINE HCL 25 MG PO TABS: 25.0000 mg | ORAL_TABLET | Freq: Four times a day (QID) | ORAL | 0 refills | Status: DC | PRN

## 2016-03-09 MED ORDER — TRAZODONE HCL 50 MG PO TABS: 50.0000 mg | ORAL_TABLET | Freq: Every evening | ORAL | 0 refills | Status: DC | PRN

## 2016-03-09 MED ORDER — HYDRALAZINE HCL 25 MG PO TABS
50.0000 mg | ORAL_TABLET | Freq: Once | ORAL | Status: AC
  Administered 2016-03-09: 50 mg via ORAL
  Filled 2016-03-09: qty 2

## 2016-03-09 NOTE — ED Notes (Signed)
Patient returned to room cursing and asking for food.  Patient given [place] sandwich and encouraged to stay and see case management.  Patient currently in room eating.

## 2016-03-09 NOTE — ED Triage Notes (Signed)
Pt here for medication refill for htn, cholesterol and depression medications; pt sts attempting to get PCP but unsuccessful at present

## 2016-03-09 NOTE — ED Notes (Signed)
ED Provider at bedside. 

## 2016-03-09 NOTE — ED Notes (Signed)
Pt spoke with case management in regards to getting a PCP. Pt was given a bus pass and [place] sandwich. Ambulatory at DC verbalized DC teaching.

## 2016-03-09 NOTE — ED Notes (Signed)
Patient came out of room stating he was tired of waiting and was leaving.  Explained to patient we were waiting for case management to come see him.  Patient states he will not wait any longer.

## 2016-03-09 NOTE — ED Notes (Signed)
Pt reports needing a PCP and being out of his cholesterol medication, BP medication, and depression medications. Pt reports his PCP passed away. Pt denies any pain today.
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
## 2016-03-09 NOTE — Discharge Planning (Signed)
[facility] awaiting call from Dr Delfina Redwood office for PCP establishment.  Will notify pt of appointment time and date when it is available.

## 2016-04-05 ENCOUNTER — Other Ambulatory Visit: Payer: Self-pay | Admitting: Internal Medicine

## 2016-04-05 ENCOUNTER — Ambulatory Visit
Admission: RE | Admit: 2016-04-05 | Discharge: 2016-04-05 | Disposition: A | Discharge status: Home / Self Care | Payer: Medicare Other | Source: Ambulatory Visit | Attending: Internal Medicine | Admitting: Internal Medicine

## 2016-04-05 DIAGNOSIS — R059 Cough, unspecified: Secondary | ICD-10-CM

## 2016-04-05 DIAGNOSIS — R05 Cough: Secondary | ICD-10-CM

## 2016-06-03 ENCOUNTER — Emergency Department (HOSPITAL_COMMUNITY)
Admission: EM | Admit: 2016-06-03 | Discharge: 2016-06-03 | Disposition: A | Discharge status: Home / Self Care | Payer: Medicare Other | Attending: Emergency Medicine | Admitting: Emergency Medicine

## 2016-06-03 ENCOUNTER — Encounter (HOSPITAL_COMMUNITY): Payer: Self-pay | Admitting: Emergency Medicine

## 2016-06-03 ENCOUNTER — Emergency Department (HOSPITAL_COMMUNITY): Payer: Medicare Other

## 2016-06-03 DIAGNOSIS — I2510 Atherosclerotic heart disease of native coronary artery without angina pectoris: Secondary | ICD-10-CM | POA: Insufficient documentation

## 2016-06-03 DIAGNOSIS — Z79899 Other long term (current) drug therapy: Secondary | ICD-10-CM | POA: Insufficient documentation

## 2016-06-03 DIAGNOSIS — N189 Chronic kidney disease, unspecified: Secondary | ICD-10-CM | POA: Insufficient documentation

## 2016-06-03 DIAGNOSIS — R51 Headache: Secondary | ICD-10-CM | POA: Diagnosis present

## 2016-06-03 DIAGNOSIS — F17210 Nicotine dependence, cigarettes, uncomplicated: Secondary | ICD-10-CM | POA: Insufficient documentation

## 2016-06-03 DIAGNOSIS — I129 Hypertensive chronic kidney disease with stage 1 through stage 4 chronic kidney disease, or unspecified chronic kidney disease: Secondary | ICD-10-CM | POA: Diagnosis not present

## 2016-06-03 DIAGNOSIS — R519 Headache, unspecified: Secondary | ICD-10-CM

## 2016-06-03 LAB — CBC WITH DIFFERENTIAL/PLATELET
BASOS ABS: 0 10*3/uL (ref 0.0–0.1)
BASOS PCT: 1 %
EOS PCT: 2 %
Eosinophils Absolute: 0.2 10*3/uL (ref 0.0–0.7)
HEMATOCRIT: 32.8 % — AB (ref 39.0–52.0)
Hemoglobin: 11.1 g/dL — ABNORMAL LOW (ref 13.0–17.0)
LYMPHS PCT: 39 %
Lymphs Abs: 2.5 10*3/uL (ref 0.7–4.0)
MCH: 28.8 pg (ref 26.0–34.0)
MCHC: 33.8 g/dL (ref 30.0–36.0)
MCV: 85.2 fL (ref 78.0–100.0)
MONO ABS: 1.4 10*3/uL — AB (ref 0.1–1.0)
MONOS PCT: 21 %
NEUTROS ABS: 2.5 10*3/uL (ref 1.7–7.7)
Neutrophils Relative %: 38 %
PLATELETS: 83 10*3/uL — AB (ref 150–400)
RBC: 3.85 MIL/uL — ABNORMAL LOW (ref 4.22–5.81)
RDW: 15.8 % — AB (ref 11.5–15.5)
WBC: 6.5 10*3/uL (ref 4.0–10.5)

## 2016-06-03 LAB — BASIC METABOLIC PANEL
ANION GAP: 7 (ref 5–15)
BUN: 26 mg/dL — ABNORMAL HIGH (ref 6–20)
CALCIUM: 8.2 mg/dL — AB (ref 8.9–10.3)
CO2: 17 mmol/L — AB (ref 22–32)
CREATININE: 1.4 mg/dL — AB (ref 0.61–1.24)
Chloride: 115 mmol/L — ABNORMAL HIGH (ref 101–111)
GFR calc Af Amer: 58 mL/min — ABNORMAL LOW (ref >60)
GFR, EST NON AFRICAN AMERICAN: 50 mL/min — AB (ref >60)
GLUCOSE: 93 mg/dL (ref 65–99)
Potassium: 4.6 mmol/L (ref 3.5–5.1)
Sodium: 139 mmol/L (ref 135–145)

## 2016-06-03 MED ORDER — HYDRALAZINE HCL 50 MG PO TABS
50.0000 mg | ORAL_TABLET | Freq: Two times a day (BID) | ORAL | Status: DC
  Administered 2016-06-03: 50 mg via ORAL
  Filled 2016-06-03: qty 1

## 2016-06-03 MED ORDER — AMLODIPINE BESYLATE 10 MG PO TABS: 10.0000 mg | ORAL_TABLET | Freq: Every day | ORAL | 0 refills | Status: DC

## 2016-06-03 MED ORDER — DIPHENHYDRAMINE HCL 50 MG/ML IJ SOLN
25.0000 mg | Freq: Once | INTRAMUSCULAR | Status: AC
  Administered 2016-06-03: 25 mg via INTRAVENOUS
  Filled 2016-06-03: qty 1

## 2016-06-03 MED ORDER — AMLODIPINE BESYLATE 5 MG PO TABS
10.0000 mg | ORAL_TABLET | Freq: Every day | ORAL | Status: DC
  Administered 2016-06-03: 10 mg via ORAL
  Filled 2016-06-03: qty 2

## 2016-06-03 MED ORDER — HYDRALAZINE HCL 50 MG PO TABS: 50.0000 mg | ORAL_TABLET | Freq: Two times a day (BID) | ORAL | 0 refills | Status: DC

## 2016-06-03 MED ORDER — PROCHLORPERAZINE EDISYLATE 5 MG/ML IJ SOLN
10.0000 mg | Freq: Once | INTRAMUSCULAR | Status: AC
  Administered 2016-06-03: 10 mg via INTRAVENOUS
  Filled 2016-06-03: qty 2
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
## 2016-06-03 NOTE — ED Notes (Signed)
Pt awake states he is ready be discharged.

## 2016-06-03 NOTE — ED Triage Notes (Signed)
Pt states he ran out of his BP meds on Thursday.

## 2016-06-03 NOTE — ED Triage Notes (Signed)
Pt states he "snorted a little bit of coke" on Thursday.

## 2016-06-03 NOTE — ED Triage Notes (Signed)
Pt states "for about a week i've had a headache, right now I took two tramadol and I can still feel it on this side." Pt c/o pain on back of head. States mild nausea, no vomiting. Pt is eating and drinking well. Pt states hes been walking and exercising a lot. No weakness numbness, no facial droop.

## 2016-06-03 NOTE — ED Notes (Signed)
Heart Healthy Diet was ordered for Lunch. 

## 2016-06-03 NOTE — ED Notes (Signed)
Patient transported to CT 

## 2016-06-03 NOTE — Discharge Instructions (Signed)
Please start taking your home blood pressure medicines again. Please stay hydrated. Please follow-up with your primary care physician for further management. If any symptoms change or worsen, please return to the nearest emergency department.

## 2016-06-21 ENCOUNTER — Encounter: Payer: Self-pay | Admitting: Gastroenterology

## 2016-07-26 ENCOUNTER — Ambulatory Visit (AMBULATORY_SURGERY_CENTER): Payer: Self-pay

## 2016-07-26 VITALS — Ht 71.0 in | Wt 233.4 lb

## 2016-07-26 DIAGNOSIS — Z86010 Personal history of colonic polyps: Secondary | ICD-10-CM

## 2016-07-26 MED ORDER — NA SULFATE-K SULFATE-MG SULF 17.5-3.13-1.6 GM/177ML PO SOLN: 1.0000 | Freq: Once | ORAL | 0 refills | Status: AC

## 2016-07-26 NOTE — Progress Notes (Signed)
Denies allergies to eggs or soy products. Denies complication of anesthesia or sedation. Denies use of weight loss medication. Denies use of O2.   Emmi instructions declined, patient doesn't have email.

## 2016-08-14 ENCOUNTER — Telehealth: Payer: Self-pay | Admitting: Gastroenterology

## 2016-08-14 NOTE — Telephone Encounter (Signed)
Noted  

## 2016-08-15 ENCOUNTER — Encounter: Payer: Medicare Other | Admitting: Gastroenterology

## 2016-08-27 ENCOUNTER — Other Ambulatory Visit: Payer: Self-pay

## 2016-08-27 ENCOUNTER — Observation Stay (HOSPITAL_COMMUNITY)
Admission: EM | Admit: 2016-08-27 | Discharge: 2016-08-30 | Disposition: A | Discharge status: Home / Self Care | Payer: Medicare Other | Attending: Internal Medicine | Admitting: Internal Medicine

## 2016-08-27 ENCOUNTER — Observation Stay (HOSPITAL_COMMUNITY): Payer: Medicare Other

## 2016-08-27 ENCOUNTER — Emergency Department (HOSPITAL_COMMUNITY): Payer: Medicare Other

## 2016-08-27 ENCOUNTER — Encounter (HOSPITAL_COMMUNITY): Payer: Self-pay

## 2016-08-27 DIAGNOSIS — E870 Hyperosmolality and hypernatremia: Secondary | ICD-10-CM | POA: Insufficient documentation

## 2016-08-27 DIAGNOSIS — R519 Headache, unspecified: Secondary | ICD-10-CM | POA: Diagnosis present

## 2016-08-27 DIAGNOSIS — D696 Thrombocytopenia, unspecified: Secondary | ICD-10-CM | POA: Insufficient documentation

## 2016-08-27 DIAGNOSIS — I129 Hypertensive chronic kidney disease with stage 1 through stage 4 chronic kidney disease, or unspecified chronic kidney disease: Secondary | ICD-10-CM | POA: Insufficient documentation

## 2016-08-27 DIAGNOSIS — B182 Chronic viral hepatitis C: Secondary | ICD-10-CM | POA: Insufficient documentation

## 2016-08-27 DIAGNOSIS — Z800 Family history of malignant neoplasm of digestive organs: Secondary | ICD-10-CM | POA: Insufficient documentation

## 2016-08-27 DIAGNOSIS — N179 Acute kidney failure, unspecified: Secondary | ICD-10-CM | POA: Diagnosis not present

## 2016-08-27 DIAGNOSIS — E039 Hypothyroidism, unspecified: Secondary | ICD-10-CM | POA: Insufficient documentation

## 2016-08-27 DIAGNOSIS — I2510 Atherosclerotic heart disease of native coronary artery without angina pectoris: Secondary | ICD-10-CM | POA: Insufficient documentation

## 2016-08-27 DIAGNOSIS — K7460 Unspecified cirrhosis of liver: Secondary | ICD-10-CM | POA: Diagnosis not present

## 2016-08-27 DIAGNOSIS — F329 Major depressive disorder, single episode, unspecified: Secondary | ICD-10-CM | POA: Diagnosis not present

## 2016-08-27 DIAGNOSIS — I10 Essential (primary) hypertension: Secondary | ICD-10-CM | POA: Diagnosis present

## 2016-08-27 DIAGNOSIS — H538 Other visual disturbances: Secondary | ICD-10-CM | POA: Diagnosis present

## 2016-08-27 DIAGNOSIS — Z228 Carrier of other infectious diseases: Secondary | ICD-10-CM | POA: Diagnosis not present

## 2016-08-27 DIAGNOSIS — E669 Obesity, unspecified: Secondary | ICD-10-CM | POA: Diagnosis not present

## 2016-08-27 DIAGNOSIS — I160 Hypertensive urgency: Secondary | ICD-10-CM | POA: Insufficient documentation

## 2016-08-27 DIAGNOSIS — E861 Hypovolemia: Secondary | ICD-10-CM | POA: Diagnosis not present

## 2016-08-27 DIAGNOSIS — Z825 Family history of asthma and other chronic lower respiratory diseases: Secondary | ICD-10-CM | POA: Insufficient documentation

## 2016-08-27 DIAGNOSIS — D631 Anemia in chronic kidney disease: Secondary | ICD-10-CM | POA: Diagnosis not present

## 2016-08-27 DIAGNOSIS — Z886 Allergy status to analgesic agent status: Secondary | ICD-10-CM | POA: Diagnosis not present

## 2016-08-27 DIAGNOSIS — F1910 Other psychoactive substance abuse, uncomplicated: Secondary | ICD-10-CM | POA: Diagnosis not present

## 2016-08-27 DIAGNOSIS — R51 Headache: Secondary | ICD-10-CM

## 2016-08-27 DIAGNOSIS — Z8249 Family history of ischemic heart disease and other diseases of the circulatory system: Secondary | ICD-10-CM | POA: Insufficient documentation

## 2016-08-27 DIAGNOSIS — Z6830 Body mass index (BMI) 30.0-30.9, adult: Secondary | ICD-10-CM | POA: Insufficient documentation

## 2016-08-27 DIAGNOSIS — N183 Chronic kidney disease, stage 3 unspecified: Secondary | ICD-10-CM | POA: Diagnosis present

## 2016-08-27 DIAGNOSIS — F17210 Nicotine dependence, cigarettes, uncomplicated: Secondary | ICD-10-CM | POA: Insufficient documentation

## 2016-08-27 DIAGNOSIS — N189 Chronic kidney disease, unspecified: Secondary | ICD-10-CM | POA: Diagnosis not present

## 2016-08-27 DIAGNOSIS — F1410 Cocaine abuse, uncomplicated: Secondary | ICD-10-CM | POA: Diagnosis not present

## 2016-08-27 DIAGNOSIS — F1011 Alcohol abuse, in remission: Secondary | ICD-10-CM | POA: Insufficient documentation

## 2016-08-27 DIAGNOSIS — Z9889 Other specified postprocedural states: Secondary | ICD-10-CM | POA: Insufficient documentation

## 2016-08-27 DIAGNOSIS — Z8261 Family history of arthritis: Secondary | ICD-10-CM | POA: Diagnosis not present

## 2016-08-27 DIAGNOSIS — E785 Hyperlipidemia, unspecified: Secondary | ICD-10-CM | POA: Diagnosis not present

## 2016-08-27 LAB — I-STAT CHEM 8, ED
BUN: 46 mg/dL — AB (ref 6–20)
CALCIUM ION: 1.09 mmol/L — AB (ref 1.15–1.40)
CREATININE: 3.1 mg/dL — AB (ref 0.61–1.24)
Chloride: 115 mmol/L — ABNORMAL HIGH (ref 101–111)
Glucose, Bld: 121 mg/dL — ABNORMAL HIGH (ref 65–99)
HEMATOCRIT: 35 % — AB (ref 39.0–52.0)
HEMOGLOBIN: 11.9 g/dL — AB (ref 13.0–17.0)
Potassium: 4.6 mmol/L (ref 3.5–5.1)
SODIUM: 146 mmol/L — AB (ref 135–145)
TCO2: 17 mmol/L (ref 0–100)

## 2016-08-27 LAB — APTT: aPTT: 43 seconds — ABNORMAL HIGH (ref 24–36)

## 2016-08-27 LAB — AMMONIA: Ammonia: 94 umol/L — ABNORMAL HIGH (ref 9–35)

## 2016-08-27 LAB — CBC
HCT: 35.4 % — ABNORMAL LOW (ref 39.0–52.0)
HEMOGLOBIN: 12.3 g/dL — AB (ref 13.0–17.0)
MCH: 29.2 pg (ref 26.0–34.0)
MCHC: 34.7 g/dL (ref 30.0–36.0)
MCV: 84.1 fL (ref 78.0–100.0)
Platelets: 99 10*3/uL — ABNORMAL LOW (ref 150–400)
RBC: 4.21 MIL/uL — AB (ref 4.22–5.81)
RDW: 14 % (ref 11.5–15.5)
WBC: 8 10*3/uL (ref 4.0–10.5)

## 2016-08-27 LAB — BRAIN NATRIURETIC PEPTIDE: B Natriuretic Peptide: 21.4 pg/mL (ref 0.0–100.0)

## 2016-08-27 LAB — COMPREHENSIVE METABOLIC PANEL
ALBUMIN: 2.8 g/dL — AB (ref 3.5–5.0)
ALK PHOS: 114 U/L (ref 38–126)
ALT: 56 U/L (ref 17–63)
ANION GAP: 9 (ref 5–15)
AST: 82 U/L — ABNORMAL HIGH (ref 15–41)
BILIRUBIN TOTAL: 1.3 mg/dL — AB (ref 0.3–1.2)
BUN: 49 mg/dL — ABNORMAL HIGH (ref 6–20)
CO2: 17 mmol/L — ABNORMAL LOW (ref 22–32)
CREATININE: 3.07 mg/dL — AB (ref 0.61–1.24)
Calcium: 8.5 mg/dL — ABNORMAL LOW (ref 8.9–10.3)
Chloride: 115 mmol/L — ABNORMAL HIGH (ref 101–111)
GFR calc Af Amer: 22 mL/min — ABNORMAL LOW (ref >60)
GFR calc non Af Amer: 19 mL/min — ABNORMAL LOW (ref >60)
Glucose, Bld: 125 mg/dL — ABNORMAL HIGH (ref 65–99)
Potassium: 4.7 mmol/L (ref 3.5–5.1)
Sodium: 141 mmol/L (ref 135–145)
Total Protein: 7.7 g/dL (ref 6.5–8.1)

## 2016-08-27 LAB — DIFFERENTIAL
Basophils Absolute: 0 10*3/uL (ref 0.0–0.1)
Basophils Relative: 0 %
EOS PCT: 2 %
Eosinophils Absolute: 0.2 10*3/uL (ref 0.0–0.7)
LYMPHS ABS: 2.9 10*3/uL (ref 0.7–4.0)
LYMPHS PCT: 36 %
Monocytes Absolute: 1.2 10*3/uL — ABNORMAL HIGH (ref 0.1–1.0)
Monocytes Relative: 15 %
NEUTROS PCT: 47 %
Neutro Abs: 3.7 10*3/uL (ref 1.7–7.7)

## 2016-08-27 LAB — PROTIME-INR
INR: 1.19
Prothrombin Time: 15.2 seconds (ref 11.4–15.2)

## 2016-08-27 LAB — I-STAT TROPONIN, ED: TROPONIN I, POC: 0.02 ng/mL (ref 0.00–0.08)

## 2016-08-27 LAB — CBG MONITORING, ED: Glucose-Capillary: 93 mg/dL (ref 65–99)

## 2016-08-27 MED ORDER — HYDRALAZINE HCL 25 MG PO TABS: 50.0000 mg | ORAL_TABLET | Freq: Two times a day (BID) | ORAL | Status: DC

## 2016-08-27 MED ORDER — AMLODIPINE BESYLATE 10 MG PO TABS
10.0000 mg | ORAL_TABLET | Freq: Every day | ORAL | Status: DC
  Administered 2016-08-27 – 2016-08-30 (×4): 10 mg via ORAL
  Filled 2016-08-27: qty 2
  Filled 2016-08-27 (×3): qty 1

## 2016-08-27 MED ORDER — HEPARIN SODIUM (PORCINE) 5000 UNIT/ML IJ SOLN
5000.0000 [IU] | Freq: Three times a day (TID) | INTRAMUSCULAR | Status: DC
  Administered 2016-08-28 – 2016-08-30 (×4): 5000 [IU] via SUBCUTANEOUS
  Filled 2016-08-27 (×6): qty 1

## 2016-08-27 MED ORDER — LEVOTHYROXINE SODIUM 50 MCG PO TABS
50.0000 ug | ORAL_TABLET | Freq: Every day | ORAL | Status: DC
  Administered 2016-08-28 – 2016-08-30 (×3): 50 ug via ORAL
  Filled 2016-08-27 (×3): qty 1

## 2016-08-27 MED ORDER — SODIUM CHLORIDE 0.9 % IV SOLN
INTRAVENOUS | Status: DC
  Administered 2016-08-28: 06:00:00 via INTRAVENOUS

## 2016-08-27 MED ORDER — SODIUM CHLORIDE 0.9 % IV BOLUS (SEPSIS)
500.0000 mL | Freq: Once | INTRAVENOUS | Status: AC
  Administered 2016-08-27: 500 mL via INTRAVENOUS

## 2016-08-27 MED ORDER — ASPIRIN 300 MG RE SUPP: 300.0000 mg | Freq: Every day | RECTAL | Status: DC

## 2016-08-27 MED ORDER — SENNOSIDES-DOCUSATE SODIUM 8.6-50 MG PO TABS: 1.0000 | ORAL_TABLET | Freq: Every evening | ORAL | Status: DC | PRN

## 2016-08-27 MED ORDER — HYDRALAZINE HCL 20 MG/ML IJ SOLN: 10.0000 mg | INTRAMUSCULAR | Status: DC | PRN

## 2016-08-27 MED ORDER — LACTULOSE 10 GM/15ML PO SOLN
20.0000 g | Freq: Three times a day (TID) | ORAL | Status: DC
  Administered 2016-08-28 – 2016-08-30 (×9): 20 g via ORAL
  Filled 2016-08-27 (×10): qty 30

## 2016-08-27 MED ORDER — HYDRALAZINE HCL 25 MG PO TABS
50.0000 mg | ORAL_TABLET | Freq: Once | ORAL | Status: AC
  Administered 2016-08-27: 50 mg via ORAL
  Filled 2016-08-27: qty 2

## 2016-08-27 MED ORDER — ASPIRIN 325 MG PO TABS
325.0000 mg | ORAL_TABLET | Freq: Every day | ORAL | Status: DC
  Administered 2016-08-28 – 2016-08-29 (×2): 325 mg via ORAL
  Filled 2016-08-27 (×2): qty 1

## 2016-08-27 MED ORDER — ACETAMINOPHEN 160 MG/5ML PO SOLN: 650.0000 mg | ORAL | Status: DC | PRN

## 2016-08-27 MED ORDER — ACETAMINOPHEN 325 MG PO TABS: 650.0000 mg | ORAL_TABLET | ORAL | Status: DC | PRN

## 2016-08-27 MED ORDER — ACETAMINOPHEN 650 MG RE SUPP: 650.0000 mg | RECTAL | Status: DC | PRN

## 2016-08-27 NOTE — Progress Notes (Signed)
Patient arrived to unit alert and oriented x 4. Patient c/o minor dizziness, 8/10 posterior h/a. Patient repositioned, placed on tele box 5M01, q 2 hr neuro and vital signs initiated. Patients PMH, current medications and treatment plan reviewed and patient v/u. Patient oriented to unit, telephone and call light placed within patients reach. RN will continue to monitor patient.

## 2016-08-27 NOTE — ED Notes (Addendum)
Pt cleared to eat a REGULAR DIET by Dr. Myna Hidalgo.

## 2016-08-27 NOTE — ED Notes (Signed)
Patient transported to Ultrasound 
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
## 2016-08-27 NOTE — ED Notes (Signed)
Attempted report x1.  Name and callback number provided.   

## 2016-08-27 NOTE — ED Triage Notes (Signed)
Per [facility], Pt is coming from home with complaints of headache x 2 days. Pt denies hx of the same or injury. Pt has HX of HTN compliant with meds. Stroke Screen Negative. Some blurred vision, nausea, and confusion along with the headache. Vitals per EMS: 178/70, 84 HR, 152 CBG, 18 RR.

## 2016-08-28 ENCOUNTER — Observation Stay (HOSPITAL_COMMUNITY): Payer: Medicare Other

## 2016-08-28 DIAGNOSIS — N179 Acute kidney failure, unspecified: Secondary | ICD-10-CM

## 2016-08-28 DIAGNOSIS — D696 Thrombocytopenia, unspecified: Secondary | ICD-10-CM | POA: Diagnosis not present

## 2016-08-28 DIAGNOSIS — N189 Chronic kidney disease, unspecified: Secondary | ICD-10-CM | POA: Diagnosis not present

## 2016-08-28 DIAGNOSIS — I160 Hypertensive urgency: Secondary | ICD-10-CM | POA: Diagnosis not present

## 2016-08-28 DIAGNOSIS — I10 Essential (primary) hypertension: Secondary | ICD-10-CM | POA: Diagnosis not present

## 2016-08-28 DIAGNOSIS — R51 Headache: Secondary | ICD-10-CM | POA: Diagnosis not present

## 2016-08-28 DIAGNOSIS — F1910 Other psychoactive substance abuse, uncomplicated: Secondary | ICD-10-CM | POA: Diagnosis not present

## 2016-08-28 DIAGNOSIS — N183 Chronic kidney disease, stage 3 (moderate): Secondary | ICD-10-CM | POA: Diagnosis not present

## 2016-08-28 LAB — RAPID URINE DRUG SCREEN, HOSP PERFORMED
Amphetamines: NOT DETECTED
BARBITURATES: NOT DETECTED
Benzodiazepines: NOT DETECTED
COCAINE: POSITIVE — AB
OPIATES: NOT DETECTED
Tetrahydrocannabinol: NOT DETECTED

## 2016-08-28 LAB — BASIC METABOLIC PANEL
ANION GAP: 9 (ref 5–15)
BUN: 46 mg/dL — ABNORMAL HIGH (ref 6–20)
CO2: 15 mmol/L — ABNORMAL LOW (ref 22–32)
Calcium: 8.2 mg/dL — ABNORMAL LOW (ref 8.9–10.3)
Chloride: 120 mmol/L — ABNORMAL HIGH (ref 101–111)
Creatinine, Ser: 2.6 mg/dL — ABNORMAL HIGH (ref 0.61–1.24)
GFR, EST AFRICAN AMERICAN: 27 mL/min — AB (ref >60)
GFR, EST NON AFRICAN AMERICAN: 24 mL/min — AB (ref >60)
GLUCOSE: 114 mg/dL — AB (ref 65–99)
POTASSIUM: 4.4 mmol/L (ref 3.5–5.1)
SODIUM: 144 mmol/L (ref 135–145)

## 2016-08-28 LAB — URINALYSIS, ROUTINE W REFLEX MICROSCOPIC
BILIRUBIN URINE: NEGATIVE
Glucose, UA: NEGATIVE mg/dL
Hgb urine dipstick: NEGATIVE
KETONES UR: NEGATIVE mg/dL
LEUKOCYTES UA: NEGATIVE
NITRITE: NEGATIVE
PROTEIN: NEGATIVE mg/dL
SPECIFIC GRAVITY, URINE: 1.014 (ref 1.005–1.030)
pH: 5 (ref 5.0–8.0)

## 2016-08-28 LAB — LIPID PANEL
CHOL/HDL RATIO: 9.3 ratio
CHOLESTEROL: 149 mg/dL (ref 0–200)
HDL: 16 mg/dL — AB (ref >40)
LDL Cholesterol: 92 mg/dL (ref 0–99)
TRIGLYCERIDES: 207 mg/dL — AB (ref <150)
VLDL: 41 mg/dL — ABNORMAL HIGH (ref 0–40)

## 2016-08-28 LAB — CREATININE, URINE, RANDOM: Creatinine, Urine: 125.92 mg/dL

## 2016-08-28 LAB — GLUCOSE, CAPILLARY: Glucose-Capillary: 120 mg/dL — ABNORMAL HIGH (ref 65–99)

## 2016-08-28 LAB — CBC
HEMATOCRIT: 32.4 % — AB (ref 39.0–52.0)
HEMOGLOBIN: 11.1 g/dL — AB (ref 13.0–17.0)
MCH: 29 pg (ref 26.0–34.0)
MCHC: 34.3 g/dL (ref 30.0–36.0)
MCV: 84.6 fL (ref 78.0–100.0)
Platelets: 82 10*3/uL — ABNORMAL LOW (ref 150–400)
RBC: 3.83 MIL/uL — AB (ref 4.22–5.81)
RDW: 14.3 % (ref 11.5–15.5)
WBC: 8.1 10*3/uL (ref 4.0–10.5)

## 2016-08-28 LAB — AMMONIA: Ammonia: 119 umol/L — ABNORMAL HIGH (ref 9–35)

## 2016-08-28 LAB — TSH: TSH: 5.866 u[IU]/mL — AB (ref 0.350–4.500)

## 2016-08-28 LAB — SODIUM, URINE, RANDOM: Sodium, Ur: 87 mmol/L

## 2016-08-28 MED ORDER — SODIUM CHLORIDE 0.9 % IV SOLN: INTRAVENOUS | Status: DC

## 2016-08-28 NOTE — Progress Notes (Signed)
PROGRESS NOTE    Bruce Hayes  ENI:[id number] DOB: 1945-05-22 DOA: 08/27/2016 PCP: Lorene Dy, MD   Outpatient Specialists:     Brief Narrative:  Bruce Hayes is a 71 y.o. male with medical history significant for polysubstance abuse in remission, coronary artery disease, chronic kidney disease, hepatitis C, hypertension, and hypothyroidism, now presenting to the emergency department for evaluation of headache, blurred vision, confusion, and speech difficulty for the past 2 days. Patient reports pain in his usual state until approximately 2 days ago when he noted the insidious development of a generalized headache. This is described as moderate in intensity, dull in character, generalized, constant, worse with certain movements, and with no alleviating factors identified. Patient reports a concomitant development of blurred vision affecting both eyes, as well as intermittent confusion. He denies any recent fall or trauma, denies fevers or chills, denies photophobia or neck stiffness, and denies chest pain or palpitations. He has had similar headaches previously, but not with the blurred vision and speech difficulty. He reports that he has not been taking his blood pressure medications since the onset of his headaches. He reports strict abstinence from alcohol and illicit substances.   Assessment & Plan:   Principal Problem:   Acute kidney injury superimposed on CKD ([place]) Active Problems:   THROMBOCYTOPENIA   Essential hypertension   CKD (chronic kidney disease), stage III   Polysubstance abuse   Hypertensive urgency   Headache   Blurred vision   AKI (acute kidney injury) (HCC)   Headache, confusion, blurred vision, speech difficulty  -MRI negative - Ammonia level has returned elevated and will be treated with lactulose  - suspect metabolic encephalopathy   Hypertension with hypertensive urgency  - Pt has hx of HTN managed at home with Norvasc, hydralazine,  chlorthalidone, and lisinopril  - He has not been taking his medications since the headaches developed  - BP 190/80 range on presentation, treated in ED with Hydralazine 50 mg PO    Acute kidney injury superimposed on CKD stage III - SCr is 3.07 on admission, up from 1.4 in May '18  - Unclear etiology; patient appears hypovolemic and will be hydrated with NS  - renal U/S - normal  Chronic hepatitis C  - Pt reports upcoming appt with GI/ID - Noted to have mild elevations in AST and total bilirubin; ammonia also elevated  - Treating with lactulose    Thrombocytopenia  - Platelets 99k on admission and stable with no apparent bleeding  - Likely secondary to liver disease, 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    Hypothyroidism  - TSH: 5.866 - Continue current-dose Synthroid for now -- outpatient follow up   DVT prophylaxis:  SQ Heparin  Code Status: Full Code   Family Communication:   Disposition Plan:     Consultants:      Subjective: Feeling better  Objective: Vitals:   08/28/16 0400 08/28/16 0600 08/28/16 0908 08/28/16 1316  BP: (!) 155/58 (!) 159/67 (!) 163/78 (!) 162/60  Pulse: 90 80 92 92  Resp: (!) 22  20 18   Temp: 98.2 F (36.8 C)  98.9 F (37.2 C) 98 F (36.7 C)  TempSrc: Oral  Oral Oral  SpO2: 97% 100% 100% 100%  Weight:      Height:        Intake/Output Summary (Last 24 hours) at 08/28/16 1420 Last data filed at 08/28/16 6144  Gross per 24 hour  Intake  53.33 ml  Output                0 ml  Net            53.33 ml   Filed Weights   08/27/16 1527 08/27/16 2115  Weight: 104.3 kg (230 lb) 98.7 kg (217 lb 9.5 oz)    Examination:  General exam: Appears calm and comfortable  Respiratory system: Clear to auscultation. Respiratory effort normal. Cardiovascular system: S1 & S2 heard, RRR. No JVD, murmurs, rubs, gallops or clicks. No pedal edema. Gastrointestinal system: Abdomen is nondistended, soft and nontender. No  organomegaly or masses felt. Normal bowel sounds heard. Central nervous system: Alert-No focal neurological deficits. Extremities: Symmetric 5 x 5 power. Skin: No rashes, lesions or ulcers Psychiatry: poor insight intoillness    Data Reviewed: I have personally reviewed following labs and imaging studies  CBC:  Recent Labs Lab 08/27/16 1544 08/27/16 1603 08/28/16 0408  WBC 8.0  --  8.1  NEUTROABS 3.7  --   --   HGB 12.3* 11.9* 11.1*  HCT 35.4* 35.0* 32.4*  MCV 84.1  --  84.6  PLT 99*  --  82*   Basic Metabolic Panel:  Recent Labs Lab 08/27/16 1544 08/27/16 1603 08/28/16 0408  NA 141 146* 144  K 4.7 4.6 4.4  CL 115* 115* 120*  CO2 17*  --  15*  GLUCOSE 125* 121* 114*  BUN 49* 46* 46*  CREATININE 3.07* 3.10* 2.60*  CALCIUM 8.5*  --  8.2*   GFR: Estimated Creatinine Clearance: 32.1 mL/min (A) (by C-G formula based on SCr of 2.6 mg/dL (H)). Liver Function Tests:  Recent Labs Lab 08/27/16 1544  AST 82*  ALT 56  ALKPHOS 114  BILITOT 1.3*  PROT 7.7  ALBUMIN 2.8*   No results for input(s): LIPASE, AMYLASE in the last 168 hours.  Recent Labs Lab 08/27/16 1842 08/28/16 0408  AMMONIA 94* 119*   Coagulation Profile:  Recent Labs Lab 08/27/16 1544  INR 1.19   Cardiac Enzymes: No results for input(s): CKTOTAL, CKMB, CKMBINDEX, TROPONINI in the last 168 hours. BNP (last 3 results) No results for input(s): PROBNP in the last 8760 hours. HbA1C: No results for input(s): HGBA1C in the last 72 hours. CBG:  Recent Labs Lab 08/27/16 1643 08/28/16 0632  GLUCAP 93 120*   Lipid Profile:  Recent Labs  08/28/16 0408  CHOL 149  HDL 16*  LDLCALC 92  TRIG 207*  CHOLHDL 9.3   Thyroid Function Tests:  Recent Labs  08/28/16 0408  TSH 5.866*   Anemia Panel: No results for input(s): VITAMINB12, FOLATE, FERRITIN, TIBC, IRON, RETICCTPCT in the last 72 hours. Urine analysis:    Component Value Date/Time   COLORURINE YELLOW 03/31/2014 0920    APPEARANCEUR CLEAR 03/31/2014 0920   LABSPEC 1.018 03/31/2014 0920   PHURINE 6.0 03/31/2014 0920   GLUCOSEU NEGATIVE 03/31/2014 0920   HGBUR NEGATIVE 03/31/2014 0920   BILIRUBINUR NEGATIVE 03/31/2014 0920   KETONESUR NEGATIVE 03/31/2014 0920   PROTEINUR 100 (A) 03/31/2014 0920   UROBILINOGEN 4.0 (H) 03/31/2014 0920   NITRITE NEGATIVE 03/31/2014 0920   LEUKOCYTESUR NEGATIVE 03/31/2014 0920     )No results found for this or any previous visit (from the past 240 hour(s)).    Anti-infectives    None       Radiology Studies: Ct Head Wo Contrast  Result Date: 08/27/2016 CLINICAL DATA:  Headache, blurred vision. EXAM: CT HEAD WITHOUT CONTRAST TECHNIQUE: Contiguous axial images were obtained  from the base of the skull through the vertex without intravenous contrast. COMPARISON:  CT scan of Jun 03, 2016. FINDINGS: Brain: No evidence of acute infarction, hemorrhage, hydrocephalus, extra-axial collection or mass lesion/mass effect. Vascular: No hyperdense vessel or unexpected calcification. Skull: Normal. Negative for fracture or focal lesion. Sinuses/Orbits: Mild mucosal thickening is noted in left maxillary sinus. Other: None. IMPRESSION: Normal head CT. Electronically Signed   By: Marijo Conception, M.D.   On: 08/27/2016 16:23   Mr Brain Wo Contrast  Result Date: 08/28/2016 CLINICAL DATA:  Blurred vision and headache. EXAM: MRI HEAD WITHOUT CONTRAST TECHNIQUE: Multiplanar, multiecho pulse sequences of the brain and surrounding structures were obtained without intravenous contrast. COMPARISON:  Head CT from yesterday FINDINGS: Brain: No acute infarction, hemorrhage, hydrocephalus, extra-axial collection or mass lesion. Mild borderline moderate patchy FLAIR hyperintensity in the cerebral white matter and pons. Patient's multiple vascular risk factors favors chronic small vessel ischemia. Normal brain volume. Apparent thickening of the infundibulum on sagittal T1 weighted acquisition is attributed to  volume averaging of the normal chiasm and pituitary stalk based on thin-section axial T1 weighted imaging. No findings along the optic pathways to explain blurry vision. No cortical findings to suggest posterior reversible encephalopathy syndrome. Vascular: Major flow voids are preserved. Skull and upper cervical spine: Negative for marrow lesion Sinuses/Orbits: Mild mucosal thickening in the left maxillary sinus. No fluid levels. No acute intraorbital finding. Bilateral small T2 hyperintensities along the lateral lids which appear dermal IMPRESSION: No acute finding. Mild to moderate cerebral white matter disease, nonspecific but likely chronic small vessel ischemia. Electronically Signed   By: Monte Fantasia M.D.   On: 08/28/2016 10:11   US Renal  Result Date: 08/27/2016 CLINICAL DATA:  Acute kidney injury. EXAM: RENAL / URINARY TRACT ULTRASOUND COMPLETE COMPARISON:  Ultrasound of December 06, 2013. FINDINGS: Right Kidney: Length: 10.3 cm. Echogenicity within normal limits. No mass or hydronephrosis visualized. Left Kidney: Length: 11.2 cm. Echogenicity within normal limits. No mass or hydronephrosis visualized. Bladder: Not well visualized as it is nondistended. IMPRESSION: Normal renal ultrasound. Electronically Signed   By: Marijo Conception, M.D.   On: 08/27/2016 20:58        Scheduled Meds: . amLODipine  10 mg Oral Daily  . aspirin  300 mg Rectal Daily   Or  . aspirin  325 mg Oral Daily  . heparin  5,000 Units Subcutaneous Q8H  . lactulose  20 g Oral TID  . levothyroxine  50 mcg Oral QAC breakfast   Continuous Infusions:   LOS: 0 days    Time spent: 35 min    [place], DO [facility] Pager [phone]  If 7PM-7AM, please contact night-coverage [email or web address] Password [email or web address] 08/28/2016, 2:20 PM

## 2016-08-28 NOTE — Progress Notes (Signed)
Patient had large BM, patient refused to use BSC and had incontinent episode while ambulating to bathroom. patient telemetry placed on hold, patient given full shower while sitting on shower chair. Patient placed back on telemetry, ambulated back to bed with no difficulty, patient does have generalized weakness in all extremities and slow gait. Patient S/L and transferring to radiology for MRI.

## 2016-08-29 DIAGNOSIS — N183 Chronic kidney disease, stage 3 (moderate): Secondary | ICD-10-CM

## 2016-08-29 DIAGNOSIS — I160 Hypertensive urgency: Secondary | ICD-10-CM | POA: Diagnosis not present

## 2016-08-29 DIAGNOSIS — R51 Headache: Secondary | ICD-10-CM | POA: Diagnosis not present

## 2016-08-29 DIAGNOSIS — N179 Acute kidney failure, unspecified: Secondary | ICD-10-CM | POA: Diagnosis not present

## 2016-08-29 LAB — BASIC METABOLIC PANEL
ANION GAP: 8 (ref 5–15)
BUN: 33 mg/dL — ABNORMAL HIGH (ref 6–20)
CALCIUM: 8.1 mg/dL — AB (ref 8.9–10.3)
CHLORIDE: 119 mmol/L — AB (ref 101–111)
CO2: 15 mmol/L — AB (ref 22–32)
Creatinine, Ser: 2 mg/dL — ABNORMAL HIGH (ref 0.61–1.24)
GFR calc non Af Amer: 32 mL/min — ABNORMAL LOW (ref >60)
GFR, EST AFRICAN AMERICAN: 37 mL/min — AB (ref >60)
GLUCOSE: 122 mg/dL — AB (ref 65–99)
Potassium: 4.4 mmol/L (ref 3.5–5.1)
Sodium: 142 mmol/L (ref 135–145)

## 2016-08-29 LAB — CBC
HEMATOCRIT: 33.4 % — AB (ref 39.0–52.0)
HEMOGLOBIN: 11.5 g/dL — AB (ref 13.0–17.0)
MCH: 29 pg (ref 26.0–34.0)
MCHC: 34.4 g/dL (ref 30.0–36.0)
MCV: 84.3 fL (ref 78.0–100.0)
Platelets: 79 10*3/uL — ABNORMAL LOW (ref 150–400)
RBC: 3.96 MIL/uL — AB (ref 4.22–5.81)
RDW: 14.1 % (ref 11.5–15.5)
WBC: 6.7 10*3/uL (ref 4.0–10.5)

## 2016-08-29 LAB — UREA NITROGEN, URINE: Urea Nitrogen, Ur: 686 mg/dL

## 2016-08-29 NOTE — Evaluation (Signed)
Physical Therapy Evaluation Patient Details Name: Bruce Hayes MRN: [id number] DOB: [birth year] Today's Date: 08/29/2016   History of Present Illness  Bruce Hayes is a 71 y.o. male with medical history significant for polysubstance abuse in remission, coronary artery disease, chronic kidney disease, hepatitis C, hypertension, and hypothyroidism, now presenting to the emergency department for evaluation of headache, blurred vision, confusion, and speech difficulty for the past 2 days.  MRI negative, but ammonia level elevated and suspected metabolic encephalopathy for tx with Lactulose.  Clinical Impression  Patient presents with decreased independence and safety with mobility.  Currently also still a little confused, but improving based on his report.  Feel he can benefit from skilled PT in the acute setting to allow return home with sister assist and follow up HHPT.      Follow Up Recommendations Home health PT    Equipment Recommendations  Cane    Recommendations for Other Services       Precautions / Restrictions Precautions Precautions: Fall      Mobility  Bed Mobility Overal bed mobility: Needs Assistance Bed Mobility: Supine to Sit     Supine to sit: Supervision     General bed mobility comments: without railing, for safety  Transfers Overall transfer level: Needs assistance Equipment used: Rolling walker (2 wheeled);None;Straight cane Transfers: Sit to/from Stand Sit to Stand: Min guard;Supervision         General transfer comment: initially no device and minguard for safety, second attempt with RW, then returned to bed to sit with cane  Ambulation/Gait Ambulation/Gait assistance: Supervision;Min guard Ambulation Distance (Feet): 150 Feet (&30, &40) Assistive device: Rolling walker (2 wheeled);None;Straight cane Gait Pattern/deviations: Step-to pattern;Step-through pattern;Decreased stride length;Trunk flexed     General Gait Details: cues for  posture, step length; initially no device, pt favoring R side reporting weakness and shuffling some, so then with RW still slow and flexed, but improved stability; pt wanted to use cane at home, so with cane 50' and cues for sequencing and correct position with S for safety  Stairs            Wheelchair Mobility    Modified Rankin (Stroke Patients Only)       Balance Overall balance assessment: Needs assistance   Sitting balance-Leahy Scale: Good     Standing balance support: No upper extremity supported Standing balance-Leahy Scale: Good Standing balance comment: without device steady in static position, wobbly with ambulation                             Pertinent Vitals/Pain Pain Assessment: No/denies pain    Home Living Family/patient expects to be discharged to:: Private residence Living Arrangements: Alone Available Help at Discharge: Family;Available PRN/intermittently (sister can A or he could stay with her) Type of Home: Apartment ([place]) Home Access: Elevator     Home Layout: One level Home Equipment: Grab bars - tub/shower      Prior Function Level of Independence: Independent         Comments: was taking the bus for groceries and did cooking/cleaning on his own     Hand Dominance   Dominant Hand: Right    Extremity/Trunk Assessment   Upper Extremity Assessment Upper Extremity Assessment: Overall WFL for tasks assessed    Lower Extremity Assessment Lower Extremity Assessment: RLE deficits/detail RLE Deficits / Details: reports R side weakness and noted functionally with limp due to weakness, but strength testing without noted  changes from L    Cervical / Trunk Assessment Cervical / Trunk Assessment: Kyphotic  Communication   Communication: No difficulties  Cognition Arousal/Alertness: Awake/alert Behavior During Therapy: WFL for tasks assessed/performed Overall Cognitive Status: Impaired/Different from baseline Area  of Impairment: Orientation;Memory;Problem solving                 Orientation Level: Time;Disoriented to (thought is was may, but knew year)   Memory: Decreased short-term memory       Problem Solving: Slow processing        General Comments General comments (skin integrity, edema, etc.): Educated on fall prevention.  Feels he can possibly stay with his sister initially if he feels he needs it.     Exercises     Assessment/Plan    PT Assessment Patient needs continued PT services  PT Problem List Decreased strength;Decreased range of motion;Decreased balance;Decreased mobility;Decreased safety awareness;Decreased cognition;Decreased knowledge of use of DME       PT Treatment Interventions Gait training;DME instruction;Therapeutic activities;Therapeutic exercise;Patient/family education;Balance training;Functional mobility training    PT Goals (Current goals can be found in the Care Plan section)  Acute Rehab PT Goals Patient Stated Goal: To return to independent PT Goal Formulation: With patient Time For Goal Achievement: 09/05/16 Potential to Achieve Goals: Good    Frequency Min 4X/week   Barriers to discharge Decreased caregiver support      Co-evaluation               AM-PAC PT "6 Clicks" Daily Activity  Outcome Measure Difficulty turning over in bed (including adjusting bedclothes, sheets and blankets)?: None Difficulty moving from lying on back to sitting on the side of the bed? : None Difficulty sitting down on and standing up from a chair with arms (e.g., wheelchair, bedside commode, etc,.)?: None Help needed moving to and from a bed to chair (including a wheelchair)?: A Little Help needed walking in hospital room?: A Little Help needed climbing 3-5 steps with a railing? : A Little 6 Click Score: 21    End of Session Equipment Utilized During Treatment: Gait belt Activity Tolerance: Patient tolerated treatment well Patient left: in bed;with  call bell/phone within reach   PT Visit Diagnosis: Other abnormalities of gait and mobility (R26.89)    Time: 1345-1411 PT Time Calculation (min) (ACUTE ONLY): 26 min   Charges:   PT Evaluation $PT Eval Moderate Complexity: 1 Mod PT Treatments $Gait Training: 8-22 mins   PT G Codes:   PT G-Codes **NOT FOR INPATIENT CLASS** Functional Assessment Tool Used: AM-PAC 6 Clicks Basic Mobility Functional Limitation: Mobility: Walking and moving around Mobility: Walking and Moving Around Current Status ([id number]): At least 20 percent but less than 40 percent impaired, limited or restricted Mobility: Walking and Moving Around Goal Status [phone]): At least 1 percent but less than 20 percent impaired, limited or restricted    [place], [place] 08/29/2016   Reginia Naas 08/29/2016, 3:03 PM

## 2016-08-29 NOTE — Progress Notes (Signed)
PROGRESS NOTE    Bruce Hayes  UEK:[id number] DOB: 08-17-1945 DOA: 08/27/2016 PCP: Lorene Dy, MD   Outpatient Specialists:     Brief Narrative:  Bruce Hayes is a 71 y.o. male with medical history significant for polysubstance abuse in remission, coronary artery disease, chronic kidney disease, hepatitis C, hypertension, and hypothyroidism, now presenting to the emergency department for evaluation of headache, blurred vision, confusion, and speech difficulty for the past 2 days. Patient reports pain in his usual state until approximately 2 days ago when he noted the insidious development of a generalized headache. This is described as moderate in intensity, dull in character, generalized, constant, worse with certain movements, and with no alleviating factors identified. Patient reports a concomitant development of blurred vision affecting both eyes, as well as intermittent confusion. He denies any recent fall or trauma, denies fevers or chills, denies photophobia or neck stiffness, and denies chest pain or palpitations. He has had similar headaches previously, but not with the blurred vision and speech difficulty. He reports that he has not been taking his blood pressure medications since the onset of his headaches. He reports strict abstinence from alcohol and illicit substances.   Assessment & Plan:   Principal Problem:   Acute kidney injury superimposed on CKD ([place]) Active Problems:   THROMBOCYTOPENIA   Essential hypertension   CKD (chronic kidney disease), stage III   Polysubstance abuse   Hypertensive urgency   Headache   Blurred vision   AKI (acute kidney injury) ([facility])   Headache, confusion, blurred vision, speech difficulty-- resolving  -MRI negative - Ammonia elevated: lactulose-- titrate to 3 BM/day - suspect metabolic encephalopathy   Hypertension with hypertensive urgency  - Pt has hx of HTN managed at home with Norvasc, hydralazine, chlorthalidone, and  lisinopril  - He has not been taking his medications  - resume   Acute kidney injury superimposed on CKD stage III - SCr is 3.07 on admission, up from 1.4 in May '18  - Unclear etiology; patient appears hypovolemic and will be hydrated with NS  - renal U/S - normal  Chronic hepatitis C  - Pt reports upcoming appt with GI/ID - Noted to have mild elevations in AST and total bilirubin -outpatient follow up  Thrombocytopenia  - Platelets 99k on admission and stable with no apparent bleeding  - Likely secondary to liver disease, 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    Hypothyroidism  - TSH: 5.866 - Continue current-dose Synthroid for now -- outpatient follow up   DVT prophylaxis:  SQ Heparin  Code Status: Full Code   Family Communication:   Disposition Plan:  Home in AM   Consultants:      Subjective: No CP, no SOB, no fever, no chills  Objective: Vitals:   08/29/16 0015 08/29/16 0518 08/29/16 1042 08/29/16 1440  BP: (!) 168/74 (!) 112/42 (!) 165/68 (!) 161/55  Pulse: 91 84 84 86  Resp: 18 18 18 17   Temp: 98.8 F (37.1 C) 98.3 F (36.8 C) 98.7 F (37.1 C) 98.9 F (37.2 C)  TempSrc: Oral Oral Oral Oral  SpO2: 100% 100% 100% 100%  Weight:      Height:        Intake/Output Summary (Last 24 hours) at 08/29/16 1523 Last data filed at 08/29/16 1300  Gross per 24 hour  Intake             1080 ml  Output  250 ml  Net              830 ml   Filed Weights   08/27/16 1527 08/27/16 2115  Weight: 104.3 kg (230 lb) 98.7 kg (217 lb 9.5 oz)    Examination:  General exam: pleasant/cooperative Respiratory system: Clear  Cardiovascular system: rrr Gastrointestinal system: +BS, soft Central nervous system: alert Extremities: moves all 4 ext Psychiatry: poor insight into illness    Data Reviewed: I have personally reviewed following labs and imaging studies  CBC:  Recent Labs Lab 08/27/16 1544 08/27/16 1603 08/28/16 0408  08/29/16 0943  WBC 8.0  --  8.1 6.7  NEUTROABS 3.7  --   --   --   HGB 12.3* 11.9* 11.1* 11.5*  HCT 35.4* 35.0* 32.4* 33.4*  MCV 84.1  --  84.6 84.3  PLT 99*  --  82* 79*   Basic Metabolic Panel:  Recent Labs Lab 08/27/16 1544 08/27/16 1603 08/28/16 0408 08/29/16 0943  NA 141 146* 144 142  K 4.7 4.6 4.4 4.4  CL 115* 115* 120* 119*  CO2 17*  --  15* 15*  GLUCOSE 125* 121* 114* 122*  BUN 49* 46* 46* 33*  CREATININE 3.07* 3.10* 2.60* 2.00*  CALCIUM 8.5*  --  8.2* 8.1*   GFR: Estimated Creatinine Clearance: 41.8 mL/min (A) (by C-G formula based on SCr of 2 mg/dL (H)). Liver Function Tests:  Recent Labs Lab 08/27/16 1544  AST 82*  ALT 56  ALKPHOS 114  BILITOT 1.3*  PROT 7.7  ALBUMIN 2.8*   No results for input(s): LIPASE, AMYLASE in the last 168 hours.  Recent Labs Lab 08/27/16 1842 08/28/16 0408  AMMONIA 94* 119*   Coagulation Profile:  Recent Labs Lab 08/27/16 1544  INR 1.19   Cardiac Enzymes: No results for input(s): CKTOTAL, CKMB, CKMBINDEX, TROPONINI in the last 168 hours. BNP (last 3 results) No results for input(s): PROBNP in the last 8760 hours. HbA1C: No results for input(s): HGBA1C in the last 72 hours. CBG:  Recent Labs Lab 08/27/16 1643 08/28/16 0632  GLUCAP 93 120*   Lipid Profile:  Recent Labs  08/28/16 0408  CHOL 149  HDL 16*  LDLCALC 92  TRIG 207*  CHOLHDL 9.3   Thyroid Function Tests:  Recent Labs  08/28/16 0408  TSH 5.866*   Anemia Panel: No results for input(s): VITAMINB12, FOLATE, FERRITIN, TIBC, IRON, RETICCTPCT in the last 72 hours. Urine analysis:    Component Value Date/Time   COLORURINE YELLOW 08/28/2016 1707   APPEARANCEUR CLEAR 08/28/2016 1707   LABSPEC 1.014 08/28/2016 1707   PHURINE 5.0 08/28/2016 1707   GLUCOSEU NEGATIVE 08/28/2016 1707   HGBUR NEGATIVE 08/28/2016 1707   BILIRUBINUR NEGATIVE 08/28/2016 1707   KETONESUR NEGATIVE 08/28/2016 1707   PROTEINUR NEGATIVE 08/28/2016 1707    UROBILINOGEN 4.0 (H) 03/31/2014 0920   NITRITE NEGATIVE 08/28/2016 1707   LEUKOCYTESUR NEGATIVE 08/28/2016 1707     )No results found for this or any previous visit (from the past 240 hour(s)).    Anti-infectives    None       Radiology Studies: Ct Head Wo Contrast  Result Date: 08/27/2016 CLINICAL DATA:  Headache, blurred vision. EXAM: CT HEAD WITHOUT CONTRAST TECHNIQUE: Contiguous axial images were obtained from the base of the skull through the vertex without intravenous contrast. COMPARISON:  CT scan of Jun 03, 2016. FINDINGS: Brain: No evidence of acute infarction, hemorrhage, hydrocephalus, extra-axial collection or mass lesion/mass effect. Vascular: No hyperdense vessel or unexpected calcification.  Skull: Normal. Negative for fracture or focal lesion. Sinuses/Orbits: Mild mucosal thickening is noted in left maxillary sinus. Other: None. IMPRESSION: Normal head CT. Electronically Signed   By: Marijo Conception, M.D.   On: 08/27/2016 16:23   Mr Brain Wo Contrast  Result Date: 08/28/2016 CLINICAL DATA:  Blurred vision and headache. EXAM: MRI HEAD WITHOUT CONTRAST TECHNIQUE: Multiplanar, multiecho pulse sequences of the brain and surrounding structures were obtained without intravenous contrast. COMPARISON:  Head CT from yesterday FINDINGS: Brain: No acute infarction, hemorrhage, hydrocephalus, extra-axial collection or mass lesion. Mild borderline moderate patchy FLAIR hyperintensity in the cerebral white matter and pons. Patient's multiple vascular risk factors favors chronic small vessel ischemia. Normal brain volume. Apparent thickening of the infundibulum on sagittal T1 weighted acquisition is attributed to volume averaging of the normal chiasm and pituitary stalk based on thin-section axial T1 weighted imaging. No findings along the optic pathways to explain blurry vision. No cortical findings to suggest posterior reversible encephalopathy syndrome. Vascular: Major flow voids are  preserved. Skull and upper cervical spine: Negative for marrow lesion Sinuses/Orbits: Mild mucosal thickening in the left maxillary sinus. No fluid levels. No acute intraorbital finding. Bilateral small T2 hyperintensities along the lateral lids which appear dermal IMPRESSION: No acute finding. Mild to moderate cerebral white matter disease, nonspecific but likely chronic small vessel ischemia. Electronically Signed   By: Monte Fantasia M.D.   On: 08/28/2016 10:11   US Renal  Result Date: 08/27/2016 CLINICAL DATA:  Acute kidney injury. EXAM: RENAL / URINARY TRACT ULTRASOUND COMPLETE COMPARISON:  Ultrasound of December 06, 2013. FINDINGS: Right Kidney: Length: 10.3 cm. Echogenicity within normal limits. No mass or hydronephrosis visualized. Left Kidney: Length: 11.2 cm. Echogenicity within normal limits. No mass or hydronephrosis visualized. Bladder: Not well visualized as it is nondistended. IMPRESSION: Normal renal ultrasound. Electronically Signed   By: Marijo Conception, M.D.   On: 08/27/2016 20:58        Scheduled Meds: . amLODipine  10 mg Oral Daily  . aspirin  300 mg Rectal Daily   Or  . aspirin  325 mg Oral Daily  . heparin  5,000 Units Subcutaneous Q8H  . lactulose  20 g Oral TID  . levothyroxine  50 mcg Oral QAC breakfast   Continuous Infusions: . sodium chloride 100 mL/hr at 08/28/16 1430     LOS: 0 days    Time spent: 25 min    Simpson, DO [facility] Pager [phone]  If 7PM-7AM, please contact night-coverage [email or web address] Password [facility] 08/29/2016, 3:23 PM

## 2016-08-30 DIAGNOSIS — I160 Hypertensive urgency: Secondary | ICD-10-CM | POA: Diagnosis not present

## 2016-08-30 DIAGNOSIS — F1910 Other psychoactive substance abuse, uncomplicated: Secondary | ICD-10-CM | POA: Diagnosis not present

## 2016-08-30 DIAGNOSIS — I10 Essential (primary) hypertension: Secondary | ICD-10-CM | POA: Diagnosis not present

## 2016-08-30 DIAGNOSIS — N179 Acute kidney failure, unspecified: Secondary | ICD-10-CM | POA: Diagnosis not present

## 2016-08-30 LAB — BASIC METABOLIC PANEL
Anion gap: 8 (ref 5–15)
BUN: 30 mg/dL — AB (ref 6–20)
CALCIUM: 7.7 mg/dL — AB (ref 8.9–10.3)
CO2: 15 mmol/L — ABNORMAL LOW (ref 22–32)
CREATININE: 1.74 mg/dL — AB (ref 0.61–1.24)
Chloride: 114 mmol/L — ABNORMAL HIGH (ref 101–111)
GFR calc Af Amer: 44 mL/min — ABNORMAL LOW (ref >60)
GFR, EST NON AFRICAN AMERICAN: 38 mL/min — AB (ref >60)
GLUCOSE: 102 mg/dL — AB (ref 65–99)
POTASSIUM: 4.1 mmol/L (ref 3.5–5.1)
Sodium: 137 mmol/L (ref 135–145)

## 2016-08-30 LAB — CBC
HCT: 30.4 % — ABNORMAL LOW (ref 39.0–52.0)
Hemoglobin: 10.6 g/dL — ABNORMAL LOW (ref 13.0–17.0)
MCH: 29.2 pg (ref 26.0–34.0)
MCHC: 34.9 g/dL (ref 30.0–36.0)
MCV: 83.7 fL (ref 78.0–100.0)
PLATELETS: 55 10*3/uL — AB (ref 150–400)
RBC: 3.63 MIL/uL — ABNORMAL LOW (ref 4.22–5.81)
RDW: 14.1 % (ref 11.5–15.5)
WBC: 6.4 10*3/uL (ref 4.0–10.5)

## 2016-08-30 LAB — AMMONIA: Ammonia: 61 umol/L — ABNORMAL HIGH (ref 9–35)

## 2016-08-30 MED ORDER — LACTULOSE 10 GM/15ML PO SOLN: 20.0000 g | Freq: Two times a day (BID) | ORAL | 0 refills | Status: DC

## 2016-08-30 NOTE — Progress Notes (Signed)
Physical Therapy Treatment Patient Details Name: Bruce Hayes MRN: [id number] DOB: 28-Mar-1945 Today's Date: 08/30/2016    History of Present Illness Bruce Hayes is a 71 y.o. male with medical history significant for polysubstance abuse in remission, coronary artery disease, chronic kidney disease, hepatitis C, hypertension, and hypothyroidism, now presenting to the emergency department for evaluation of headache, blurred vision, confusion, and speech difficulty for the past 2 days.  MRI negative, but ammonia level elevated and suspected metabolic encephalopathy for tx with Lactulose.    PT Comments    Pt more alert today than previous sessions; A&Ox4. Pt agreeable to mobilize with PT and states he plans to return home to sister's house. Pt requires no physical assist for mobility and presents with improved gait with single point cane. Pt also with fair standing balance during ambulation even when challenged with head turns, change in velocity and sudden turns and stops. Will continue to follow for mobilization, balance training and education on fall prevention for safe d/c.    Follow Up Recommendations  Home health PT     Equipment Recommendations  Cane    Recommendations for Other Services       Precautions / Restrictions Precautions Precautions: Fall Restrictions Weight Bearing Restrictions: No    Mobility  Bed Mobility Overal bed mobility: Modified Independent Bed Mobility: Supine to Sit     Supine to sit: Modified independent (Device/Increase time)     General bed mobility comments: Increased time, without use of railing   Transfers Overall transfer level: Needs assistance   Transfers: Sit to/from Stand Sit to Stand: Supervision         General transfer comment: Supervision for safety to stand to use [facility]  Ambulation/Gait Ambulation/Gait assistance: Supervision Ambulation Distance (Feet): 300 Feet Assistive device: Straight cane Gait  Pattern/deviations: Step-through pattern;Trunk flexed;Drifts right/left   Gait velocity interpretation: Below normal speed for age/gender General Gait Details: Pt with good step through pattern with increased velocity and more fluid from previous sessions. VCs for upright posture. Pt drifts left/right slightly at times, but no imbalance   Stairs            Wheelchair Mobility    Modified Rankin (Stroke Patients Only)       Balance Overall balance assessment: Needs assistance Sitting-balance support: No upper extremity supported;Feet unsupported Sitting balance-Leahy Scale: Good     Standing balance support: Single extremity supported;During functional activity Standing balance-Leahy Scale: Fair Standing balance comment: Pt requires use of SPC for UE support during ambulation              High level balance activites: Turns;Sudden stops;Head turns High Level Balance Comments: Pt able to complete head turns, increase and decrease in velocity and turning and stopping well during ambulation without imbalances             Cognition Arousal/Alertness: Awake/alert Behavior During Therapy: WFL for tasks assessed/performed Overall Cognitive Status: Within Functional Limits for tasks assessed                                        Exercises General Exercises - Lower Extremity Long Arc Quad: AROM;Both;10 reps;Seated Hip Flexion/Marching: AROM;Seated;Both;10 reps    General Comments General comments (skin integrity, edema, etc.): Educated on fall prevention in the home       Pertinent Vitals/Pain Pain Assessment: No/denies pain    Home Living  Prior Function            PT Goals (current goals can now be found in the care plan section) Acute Rehab PT Goals Patient Stated Goal: To return to independent PT Goal Formulation: With patient Time For Goal Achievement: 09/13/16 Potential to Achieve Goals: Good Progress  towards PT goals: Progressing toward goals    Frequency    Min 4X/week      PT Plan Current plan remains appropriate    Co-evaluation              AM-PAC PT "6 Clicks" Daily Activity  Outcome Measure  Difficulty turning over in bed (including adjusting bedclothes, sheets and blankets)?: None Difficulty moving from lying on back to sitting on the side of the bed? : None Difficulty sitting down on and standing up from a chair with arms (e.g., wheelchair, bedside commode, etc,.)?: None Help needed moving to and from a bed to chair (including a wheelchair)?: A Little Help needed walking in hospital room?: A Little Help needed climbing 3-5 steps with a railing? : A Little 6 Click Score: 21    End of Session Equipment Utilized During Treatment: Gait belt Activity Tolerance: Patient tolerated treatment well Patient left: in bed;with call bell/phone within reach   PT Visit Diagnosis: Other abnormalities of gait and mobility (R26.89);Difficulty in walking, not elsewhere classified (R26.2)     Time: 3810-1751 PT Time Calculation (min) (ACUTE ONLY): 21 min  Charges:  $Gait Training: 8-22 mins                    G Codes:       Elberta Leatherwood, SPT Acute Rehab [place] 08/30/2016, 10:29 AM

## 2016-08-30 NOTE — Discharge Summary (Signed)
Physician Discharge Summary  Bruce Hayes KKX:[id number] DOB: 10-03-1945 DOA: 08/27/2016  PCP: Lorene Dy, MD  Admit date: 08/27/2016 Discharge date: 08/30/2016   Recommendations for Outpatient Follow-Up:   1. CBC 1 week re plts 2. Outpatient referral to Hep c clinic 3. Home  Health 4. Cocaine cessation   Discharge Diagnosis:   Principal Problem:   Acute kidney injury superimposed on CKD ([place]) Active Problems:   THROMBOCYTOPENIA   Essential hypertension   CKD (chronic kidney disease), stage III   Polysubstance abuse   Hypertensive urgency   Headache   Blurred vision   AKI (acute kidney injury) [facility])   Discharge disposition:  Home with sister  Discharge Condition: Improved.  Diet recommendation: Low sodium, heart healthy  Wound care: None.   History of Present Illness:   Bruce Hayes a 71 y.o.malewith medical history significant for polysubstance abuse in remission, coronary artery disease, chronic kidney disease, hepatitis C, hypertension, and hypothyroidism, now presenting to the emergency department for evaluation of headache, blurred vision, confusion, and speech difficulty for the past 2 days. Patient reports pain in his usual state until approximately 2 days ago when he noted the insidious development of a generalized headache. This is described as moderate in intensity, dull in character, generalized, constant, worse with certain movements, and with no alleviating factors identified. Patient reports a concomitant development of blurred vision affecting both eyes, as well as intermittent confusion. He denies any recent fall or trauma, denies fevers or chills, denies photophobia or neck stiffness, and denies chest pain or palpitations. He has had similar headaches previously, but not with the blurred vision and speech difficulty. He reports that he has not been taking his blood pressure medications since the onset of his headaches. He reports strict  abstinence from alcohol and illicit substances.    Hospital Course by Problem:  Headache, confusion, blurred vision, speech difficulty-- resolving  -MRI negative - Ammonia elevated: lactulose-- titrate to 3 BM/day - suspect metabolic encephalopathy   Hypertension with hypertensive urgency  - Pt has hx of HTN managed at home with Norvasc, hydralazine, chlorthalidone, and lisinopril  - He has not been taking his medications  - resume   Acute kidney injury superimposed on CKD stage III - SCr is 3.07 on admission, up from 1.4 in May '18  - Unclear etiology; patient appears hypovolemic and will be hydrated with NS  - renal U/S- normal  Chronic hepatitis C  - Pt reports upcoming appt with GI/ID - Noted to have mild elevations in AST and total bilirubin -outpatient follow up  Thrombocytopenia  - Platelets decreased with no apparent bleeding  - Likely secondary to liver disease, hx of alcohol abuse   Hypothyroidism  - TSH: 5.866 - Continue current-dose Synthroid for now -- outpatient follow up  Cocaine abuse -advised cessation     Medical Consultants:    None.   Discharge Exam:   Vitals:   08/30/16 0004 08/30/16 0456  BP: (!) 122/39 (!) 155/65  Pulse: 97 92  Resp: 16 16  Temp: 99.3 F (37.4 C) 99 F (37.2 C)   Vitals:   08/29/16 1800 08/29/16 2141 08/30/16 0004 08/30/16 0456  BP: (!) 163/62 (!) 174/58 (!) 122/39 (!) 155/65  Pulse: 85 94 97 92  Resp: 17 16 16 16   Temp: 98.8 F (37.1 C) 99.4 F (37.4 C) 99.3 F (37.4 C) 99 F (37.2 C)  TempSrc: Oral Oral Oral Oral  SpO2: 100% 100% 100% 100%  Weight:  Height:        Gen:  NAD   The results of significant diagnostics from this hospitalization (including imaging, microbiology, ancillary and laboratory) are listed below for reference.     Procedures and Diagnostic Studies:   Ct Head Wo Contrast  Result Date: 08/27/2016 CLINICAL DATA:  Headache, blurred vision. EXAM: CT HEAD WITHOUT  CONTRAST TECHNIQUE: Contiguous axial images were obtained from the base of the skull through the vertex without intravenous contrast. COMPARISON:  CT scan of Jun 03, 2016. FINDINGS: Brain: No evidence of acute infarction, hemorrhage, hydrocephalus, extra-axial collection or mass lesion/mass effect. Vascular: No hyperdense vessel or unexpected calcification. Skull: Normal. Negative for fracture or focal lesion. Sinuses/Orbits: Mild mucosal thickening is noted in left maxillary sinus. Other: None. IMPRESSION: Normal head CT. Electronically Signed   By: Marijo Conception, M.D.   On: 08/27/2016 16:23   Mr Brain Wo Contrast  Result Date: 08/28/2016 CLINICAL DATA:  Blurred vision and headache. EXAM: MRI HEAD WITHOUT CONTRAST TECHNIQUE: Multiplanar, multiecho pulse sequences of the brain and surrounding structures were obtained without intravenous contrast. COMPARISON:  Head CT from yesterday FINDINGS: Brain: No acute infarction, hemorrhage, hydrocephalus, extra-axial collection or mass lesion. Mild borderline moderate patchy FLAIR hyperintensity in the cerebral white matter and pons. Patient's multiple vascular risk factors favors chronic small vessel ischemia. Normal brain volume. Apparent thickening of the infundibulum on sagittal T1 weighted acquisition is attributed to volume averaging of the normal chiasm and pituitary stalk based on thin-section axial T1 weighted imaging. No findings along the optic pathways to explain blurry vision. No cortical findings to suggest posterior reversible encephalopathy syndrome. Vascular: Major flow voids are preserved. Skull and upper cervical spine: Negative for marrow lesion Sinuses/Orbits: Mild mucosal thickening in the left maxillary sinus. No fluid levels. No acute intraorbital finding. Bilateral small T2 hyperintensities along the lateral lids which appear dermal IMPRESSION: No acute finding. Mild to moderate cerebral white matter disease, nonspecific but likely chronic small  vessel ischemia. Electronically Signed   By: Monte Fantasia M.D.   On: 08/28/2016 10:11   US Renal  Result Date: 08/27/2016 CLINICAL DATA:  Acute kidney injury. EXAM: RENAL / URINARY TRACT ULTRASOUND COMPLETE COMPARISON:  Ultrasound of December 06, 2013. FINDINGS: Right Kidney: Length: 10.3 cm. Echogenicity within normal limits. No mass or hydronephrosis visualized. Left Kidney: Length: 11.2 cm. Echogenicity within normal limits. No mass or hydronephrosis visualized. Bladder: Not well visualized as it is nondistended. IMPRESSION: Normal renal ultrasound. Electronically Signed   By: Marijo Conception, M.D.   On: 08/27/2016 20:58     Labs:   Basic Metabolic Panel:  Recent Labs Lab 08/27/16 1544 08/27/16 1603 08/28/16 0408 08/29/16 0943 08/30/16 0744  NA 141 146* 144 142 137  K 4.7 4.6 4.4 4.4 4.1  CL 115* 115* 120* 119* 114*  CO2 17*  --  15* 15* 15*  GLUCOSE 125* 121* 114* 122* 102*  BUN 49* 46* 46* 33* 30*  CREATININE 3.07* 3.10* 2.60* 2.00* 1.74*  CALCIUM 8.5*  --  8.2* 8.1* 7.7*   GFR Estimated Creatinine Clearance: 48 mL/min (A) (by C-G formula based on SCr of 1.74 mg/dL (H)). Liver Function Tests:  Recent Labs Lab 08/27/16 1544  AST 82*  ALT 56  ALKPHOS 114  BILITOT 1.3*  PROT 7.7  ALBUMIN 2.8*   No results for input(s): LIPASE, AMYLASE in the last 168 hours.  Recent Labs Lab 08/27/16 1842 08/28/16 0408 08/30/16 0744  AMMONIA 94* 119* 61*   Coagulation profile  Recent Labs Lab 08/27/16 1544  INR 1.19    CBC:  Recent Labs Lab 08/27/16 1544 08/27/16 1603 08/28/16 0408 08/29/16 0943 08/30/16 0744  WBC 8.0  --  8.1 6.7 6.4  NEUTROABS 3.7  --   --   --   --   HGB 12.3* 11.9* 11.1* 11.5* 10.6*  HCT 35.4* 35.0* 32.4* 33.4* 30.4*  MCV 84.1  --  84.6 84.3 83.7  PLT 99*  --  82* 79* 55*   Cardiac Enzymes: No results for input(s): CKTOTAL, CKMB, CKMBINDEX, TROPONINI in the last 168 hours. BNP: Invalid input(s): POCBNP CBG:  Recent Labs Lab  08/27/16 1643 08/28/16 0632  GLUCAP 93 120*   D-Dimer No results for input(s): DDIMER in the last 72 hours. Hgb A1c No results for input(s): HGBA1C in the last 72 hours. Lipid Profile  Recent Labs  08/28/16 0408  CHOL 149  HDL 16*  LDLCALC 92  TRIG 207*  CHOLHDL 9.3   Thyroid function studies  Recent Labs  08/28/16 0408  TSH 5.866*   Anemia work up No results for input(s): VITAMINB12, FOLATE, FERRITIN, TIBC, IRON, RETICCTPCT in the last 72 hours. Microbiology No results found for this or any previous visit (from the past 240 hour(s)).   Discharge Instructions:   Discharge Instructions    Diet - low sodium heart healthy    Complete by:  As directed    Discharge instructions    Complete by:  As directed    Home health CBC/BMP 1 week Goal of lactulose if 3 BM/day, can titrate dose for this goal   Increase activity slowly    Complete by:  As directed      Allergies as of 08/30/2016      Reactions   Tylenol [acetaminophen] Other (See Comments)   Liver condition      Medication List    STOP taking these medications   hydrALAZINE 50 MG tablet Commonly known as:  APRESOLINE     TAKE these medications   amLODipine 10 MG tablet Commonly known as:  NORVASC Take 1 tablet (10 mg total) by mouth daily.   chlorthalidone 25 MG tablet Commonly known as:  HYGROTON Take 25 mg by mouth daily.   lactulose 10 GM/15ML solution Commonly known as:  CHRONULAC Take 30 mLs (20 g total) by mouth 2 (two) times daily.   levothyroxine 50 MCG tablet Commonly known as:  SYNTHROID, LEVOTHROID Take 50 mcg by mouth daily before breakfast.   lisinopril 20 MG tablet Commonly known as:  PRINIVIL,ZESTRIL Take 20 mg by mouth daily.         Time coordinating discharge: 35 min  Signed:  Dom Haverland U Sayge Salvato   [facility] 08/30/2016, 8:44 AM

## 2016-08-30 NOTE — Care Management Note (Signed)
Case Management Note  Patient Details  Name: Bruce Hayes MRN: [id number] Date of Birth: [birth year]  Subjective/Objective:                    Action/Plan: Patient discharging home with orders for [facility] services. CM met with the patient and provided him a list of [facility] agencies. He selected [facility] and [facility] but neither agency could see patient after d/c. CM informed patient and [facility] was selected. Santiago Glad with [facility] notified and accepted the referral.  Pt with orders for cane. Santiago Glad with [facility] notified and deliver the equipment to the room.  Patient plans on staying with sister for a little while at [address] in [place]. Santiago Glad aware.  Pt states his sister is providing transportation home.   Expected Discharge Date:  08/30/16               Expected Discharge Plan:  [place]  In-House Referral:     Discharge planning Services  CM Consult  Post Acute Care Choice:  Durable Medical Equipment, Home Health Choice offered to:  Patient  DME Arranged:  Kasandra Knudsen DME Agency:  [place] Arranged:  PT, OT, Nurse's Aide [place] Agency:  [facility]  Status of Service:  Completed, signed off  If discussed at [place] of Stay Meetings, dates discussed:    Additional Comments:  Pollie Friar, RN 08/30/2016, 5:25 PM

## 2016-08-30 NOTE — Progress Notes (Signed)
D/c reviewed with patient and sister. No further questions at this time

## 2016-08-31 LAB — HEMOGLOBIN A1C
HEMOGLOBIN A1C: 5.1 % (ref 4.8–5.6)
MEAN PLASMA GLUCOSE: 100 mg/dL

## 2016-10-23 ENCOUNTER — Other Ambulatory Visit: Payer: Self-pay | Admitting: Nurse Practitioner

## 2016-10-23 DIAGNOSIS — K7469 Other cirrhosis of liver: Secondary | ICD-10-CM

## 2016-11-06 ENCOUNTER — Encounter: Payer: Self-pay | Admitting: Physician Assistant

## 2016-11-06 ENCOUNTER — Ambulatory Visit
Admission: RE | Admit: 2016-11-06 | Discharge: 2016-11-06 | Disposition: A | Discharge status: Home / Self Care | Payer: Medicare Other | Source: Ambulatory Visit | Attending: Nurse Practitioner | Admitting: Nurse Practitioner

## 2016-11-06 DIAGNOSIS — K7469 Other cirrhosis of liver: Secondary | ICD-10-CM

## 2016-11-16 ENCOUNTER — Ambulatory Visit (INDEPENDENT_AMBULATORY_CARE_PROVIDER_SITE_OTHER): Payer: Medicare Other | Admitting: Physician Assistant

## 2016-11-16 ENCOUNTER — Encounter: Payer: Self-pay | Admitting: Physician Assistant

## 2016-11-16 VITALS — BP 144/78 | HR 68 | Ht 71.0 in | Wt 228.4 lb

## 2016-11-16 DIAGNOSIS — K7469 Other cirrhosis of liver: Secondary | ICD-10-CM

## 2016-11-16 DIAGNOSIS — Z86010 Personal history of colonic polyps: Secondary | ICD-10-CM | POA: Diagnosis not present

## 2016-11-16 DIAGNOSIS — Z1211 Encounter for screening for malignant neoplasm of colon: Secondary | ICD-10-CM

## 2016-11-16 DIAGNOSIS — B182 Chronic viral hepatitis C: Secondary | ICD-10-CM | POA: Diagnosis not present

## 2016-11-16 MED ORDER — NA SULFATE-K SULFATE-MG SULF 17.5-3.13-1.6 GM/177ML PO SOLN: ORAL | 0 refills | Status: DC

## 2016-11-16 NOTE — Progress Notes (Signed)
Subjective:    Patient ID: Bruce Hayes, male    DOB: 1945/03/01, 71 y.o.   MRN: [id number]  HPI Bruce Hayes is a 71 year old African-American male, known remotely to Dr. Ardis Hughs from prior colonoscopy done in 2010 who is referred today by Roosevelt Locks NP for screening EGD. Patient has history of cirrhosis, hepatitis C, and history of EtOH and substance abuse. Also with hypertension coronary artery disease and chronic kidney disease. Patient has had recent evaluation at the hepatology clinic and is being considered for treatment of hepatitis C. Recent upper abdominal ultrasound showed cirrhosis, no ascites and no hepatic lesion. He has not had prior endoscopy for variceal screening, no prior history of GI bleeding. By notes it appears that he is still using alcohol and drug screen was positive for cocaine as of July 2018.   He has no current GI complaints today, specifically no complaints of abdominal discomfort, appetite has been fine, weight has been stable, bowel movements have been regular, sometimes loose on lactulose, no melena or hematochezia. Last colonoscopy was done in April 2010 with finding of 3 small polyps, one was a tubular adenoma and 1 hyperplastic polyp. He was recommended for 5 year interval follow-up. Labs from August 2018 reviewed with to [facility] of 6.4, hemoglobin 10.6 platelets 55,000 venous ammonia 61 BUN 30 and creatinine of 1.74, I do not have a recent INR  Review of Systems Pertinent positive and negative review of systems were noted in the above HPI section.  All other review of systems was otherwise negative.  Outpatient Encounter Prescriptions as of 11/16/2016  Medication Sig  . amLODipine (NORVASC) 10 MG tablet Take 1 tablet (10 mg total) by mouth daily.  . chlorthalidone (HYGROTON) 25 MG tablet Take 25 mg by mouth daily.  Marland Kitchen lactulose (CHRONULAC) 10 GM/15ML solution Take 30 mLs (20 g total) by mouth 2 (two) times daily.  Marland Kitchen levothyroxine (SYNTHROID, LEVOTHROID) 50 MCG  tablet Take 50 mcg by mouth daily before breakfast.   . lisinopril (PRINIVIL,ZESTRIL) 20 MG tablet Take 20 mg by mouth daily.  . Na Sulfate-K Sulfate-Mg Sulf 17.5-3.13-1.6 GM/177ML SOLN Take as directed for colonoscopy.   No facility-administered encounter medications on file as of 11/16/2016.    Allergies  Allergen Reactions  . Tylenol [Acetaminophen] Other (See Comments)    Liver condition   Patient Active Problem List   Diagnosis Date Noted  . Hypertensive urgency 08/27/2016  . Acute kidney injury superimposed on CKD ([place]) 08/27/2016  . Headache 08/27/2016  . Blurred vision 08/27/2016  . AKI (acute kidney injury) ([place]) 08/27/2016  . Severe alcohol use disorder ([place]) 11/11/2015  . Alcohol use disorder, severe, dependence ([place]) 11/08/2015  . Alcohol abuse 12/07/2013  . Cocaine abuse ([place]) 12/07/2013  . Polysubstance abuse ([place]) 12/07/2013  . Alcohol use disorder, moderate, dependence ([place]) 12/07/2013  . Depression   . 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 CKD (chronic kidney disease), stage III ([place]) 09/06/2007  . HEPATITIS B CARRIER 07/16/2007  . UNSPECIFIED DISORDER OF LIVER 07/03/2007  . HEPATITIS C 05/22/2007  . HYPERLIPIDEMIA 05/22/2007  . OBESITY 05/22/2007  . THROMBOCYTOPENIA 05/22/2007  . TOBACCO ABUSE 05/22/2007  . Essential hypertension 05/22/2007  . COCAINE ABUSE, HX OF 05/22/2007   Social History   Social History  . Marital status: Single    Spouse name: N/A  . Number of children: N/A  . Years of education: N/A   Occupational History  . Not on file.   Social History Main  Topics  . Smoking status: Current Some Day Smoker    Packs/day: 0.25    Types: Cigarettes    Last attempt to quit: 05/21/2007  . Smokeless tobacco: Never Used  . Alcohol use 2.4 oz/week    4 Shots of liquor per week     Comment: h/o heavy alcohol use,  drinks 15th of Brandy daily  . Drug use: Yes    Types: Cocaine     Comment: cocaine use on  occassion   . Sexual activity: No   Other Topics Concern  . Not on file   Social History Narrative   Unemployed   Patient had a friend who recently died of liver cancer.   Financial assistance approved for 100% discount at [facility] and has [facility] card July 26 2009    Mr. Binns family history includes Arthritis in his mother; Asthma in his mother; Cancer in his father; Colon cancer in his father; Coronary artery disease in his daughter and mother.      Objective:    Vitals:   11/16/16 1043  BP: (!) 144/78  Pulse: 68    Physical Exam  well-developed older African-American male in no acute distress, pleasant blood pressure 144/78 pulse 68, BMI 31.8. HEENT; nontraumatic normocephalic EOMI PERRLA sclera anicteric, Cardiovascular; regular rate and rhythm with S1-S2 no murmur or gallop, Pulmonary; clear bilaterally, Abdomen; soft, no appreciable fluid wave bowel sounds are present there is no palpable mass or hepatosplenomegaly, Rectal ;exam not done, Extremities; trace edema bilateral ankles, Neuropsych ;mood and affect appropriate       Assessment & Plan:   #72 71 year old African-American male with cirrhosis secondary to hepatitis C and EtOH who is being followed at the [facility] hepatology clinic and being considered for treatment of hepatitis C. Patient has not had prior endoscopy for variceal screening #2 history of adenomatous colon polyp on colonoscopy 2010-overdue for follow-up #3 normocytic anemia #4 thrombocytopenia #5 history of mild hepatic encephalopathy #6 chronic kidney disease #7 recent documentation of ongoing EtOH and substance abuse. #8 positive family history of colon cancer in patient's father  Plan; Patient will be scheduled for upper endoscopy and colonoscopy with Dr. Ardis Hughs. Both procedures were discussed in detail with patient including risks and benefits and indications and he is agreeable to proceed. Procedures will be scheduled at [facility], so that  banding of esophageal varices could be undertaken if indicated.  Amy Genia Harold PA-C 11/16/2016   Cc: Lorene Dy, MD

## 2016-11-16 NOTE — Progress Notes (Signed)
I agree with the above note, plan 

## 2016-11-16 NOTE — Patient Instructions (Addendum)
You have been scheduled for a colonoscopy. Please follow written instructions given to you at your visit today.  Please pick up your prep supplies at the pharmacy within the next 1-3 days. Bruce Hayes. If you use inhalers (even only as needed), please bring them with you on the day of your procedure. Your physician has requested that you go to www.startemmi.com and enter the access code given to you at your visit today. This web site gives a general overview about your procedure. However, you should still follow specific instructions given to you by our office regarding your preparation for the procedure.

## 2016-11-24 ENCOUNTER — Encounter (HOSPITAL_COMMUNITY): Payer: Self-pay | Admitting: *Deleted

## 2016-11-24 NOTE — Progress Notes (Signed)
Spoke with patient by phone and patient stated last cocaine use 11-03-16, patient instructed to not use anymore cocaine before 11-30-16 procedure at [facility] long and patient stated he would not use any more cocaine prior to procedure 11-30-16, spoke with dr Annie Main [facility] anesthesia, and made aware patient last cocoaine use 11-03-16 and patient istructed not to use any more cocaine priot to 11-30-16 procedure at [facility] long. Dr Gifford Shave stated anesthesia will talk to patient 11-30-16 and asses if drug screen needed.

## 2016-11-29 ENCOUNTER — Telehealth: Payer: Self-pay | Admitting: Gastroenterology

## 2016-11-29 NOTE — Telephone Encounter (Signed)
The pt has been moved to 12/13 and re instructed.  Scheduling has been notified.

## 2016-11-29 NOTE — Telephone Encounter (Signed)
Left message on machine to call back  

## 2017-01-11 ENCOUNTER — Ambulatory Visit (HOSPITAL_COMMUNITY): Payer: Medicare Other | Admitting: Anesthesiology

## 2017-01-11 ENCOUNTER — Encounter (HOSPITAL_COMMUNITY): Payer: Self-pay | Admitting: Gastroenterology

## 2017-01-11 ENCOUNTER — Encounter (HOSPITAL_COMMUNITY)
Admission: RE | Disposition: A | Discharge status: Home / Self Care | Payer: Self-pay | Source: Ambulatory Visit | Attending: Gastroenterology

## 2017-01-11 ENCOUNTER — Other Ambulatory Visit: Payer: Self-pay

## 2017-01-11 ENCOUNTER — Ambulatory Visit (HOSPITAL_COMMUNITY)
Admission: RE | Admit: 2017-01-11 | Discharge: 2017-01-11 | Disposition: A | Discharge status: Home / Self Care | Payer: Medicare Other | Source: Ambulatory Visit | Attending: Gastroenterology | Admitting: Gastroenterology

## 2017-01-11 DIAGNOSIS — E669 Obesity, unspecified: Secondary | ICD-10-CM | POA: Diagnosis not present

## 2017-01-11 DIAGNOSIS — Z86010 Personal history of colonic polyps: Secondary | ICD-10-CM | POA: Insufficient documentation

## 2017-01-11 DIAGNOSIS — K766 Portal hypertension: Secondary | ICD-10-CM

## 2017-01-11 DIAGNOSIS — Z8619 Personal history of other infectious and parasitic diseases: Secondary | ICD-10-CM | POA: Insufficient documentation

## 2017-01-11 DIAGNOSIS — D122 Benign neoplasm of ascending colon: Secondary | ICD-10-CM | POA: Insufficient documentation

## 2017-01-11 DIAGNOSIS — F17210 Nicotine dependence, cigarettes, uncomplicated: Secondary | ICD-10-CM | POA: Diagnosis not present

## 2017-01-11 DIAGNOSIS — B182 Chronic viral hepatitis C: Secondary | ICD-10-CM

## 2017-01-11 DIAGNOSIS — Z79899 Other long term (current) drug therapy: Secondary | ICD-10-CM | POA: Insufficient documentation

## 2017-01-11 DIAGNOSIS — T502X5A Adverse effect of carbonic-anhydrase inhibitors, benzothiadiazides and other diuretics, initial encounter: Secondary | ICD-10-CM | POA: Diagnosis not present

## 2017-01-11 DIAGNOSIS — K2950 Unspecified chronic gastritis without bleeding: Secondary | ICD-10-CM | POA: Diagnosis not present

## 2017-01-11 DIAGNOSIS — K3189 Other diseases of stomach and duodenum: Secondary | ICD-10-CM | POA: Insufficient documentation

## 2017-01-11 DIAGNOSIS — I2510 Atherosclerotic heart disease of native coronary artery without angina pectoris: Secondary | ICD-10-CM | POA: Insufficient documentation

## 2017-01-11 DIAGNOSIS — E039 Hypothyroidism, unspecified: Secondary | ICD-10-CM | POA: Diagnosis not present

## 2017-01-11 DIAGNOSIS — I10 Essential (primary) hypertension: Secondary | ICD-10-CM | POA: Diagnosis not present

## 2017-01-11 DIAGNOSIS — Z79890 Hormone replacement therapy (postmenopausal): Secondary | ICD-10-CM | POA: Diagnosis not present

## 2017-01-11 DIAGNOSIS — A048 Other specified bacterial intestinal infections: Secondary | ICD-10-CM

## 2017-01-11 DIAGNOSIS — K2970 Gastritis, unspecified, without bleeding: Secondary | ICD-10-CM

## 2017-01-11 DIAGNOSIS — T464X5A Adverse effect of angiotensin-converting-enzyme inhibitors, initial encounter: Secondary | ICD-10-CM | POA: Diagnosis not present

## 2017-01-11 DIAGNOSIS — K2990 Gastroduodenitis, unspecified, without bleeding: Secondary | ICD-10-CM

## 2017-01-11 DIAGNOSIS — Z6831 Body mass index (BMI) 31.0-31.9, adult: Secondary | ICD-10-CM | POA: Diagnosis not present

## 2017-01-11 DIAGNOSIS — N189 Chronic kidney disease, unspecified: Secondary | ICD-10-CM | POA: Insufficient documentation

## 2017-01-11 DIAGNOSIS — K7469 Other cirrhosis of liver: Secondary | ICD-10-CM

## 2017-01-11 DIAGNOSIS — K7460 Unspecified cirrhosis of liver: Secondary | ICD-10-CM | POA: Diagnosis not present

## 2017-01-11 DIAGNOSIS — Z1211 Encounter for screening for malignant neoplasm of colon: Secondary | ICD-10-CM | POA: Diagnosis not present

## 2017-01-11 DIAGNOSIS — Z860101 Personal history of adenomatous and serrated colon polyps: Secondary | ICD-10-CM

## 2017-01-11 HISTORY — DX: Hypothyroidism, unspecified: E03.9

## 2017-01-11 HISTORY — DX: Pain in unspecified joint: M25.50

## 2017-01-11 HISTORY — PX: ESOPHAGOGASTRODUODENOSCOPY (EGD) WITH PROPOFOL: SHX5813

## 2017-01-11 HISTORY — PX: COLONOSCOPY WITH PROPOFOL: SHX5780

## 2017-01-11 SURGERY — COLONOSCOPY WITH PROPOFOL
Anesthesia: Monitor Anesthesia Care

## 2017-01-11 MED ORDER — SODIUM CHLORIDE 0.9 % IV SOLN: INTRAVENOUS | Status: DC

## 2017-01-11 MED ORDER — PROPOFOL 10 MG/ML IV BOLUS
INTRAVENOUS | Status: AC
  Filled 2017-01-11: qty 40

## 2017-01-11 MED ORDER — PROPOFOL 500 MG/50ML IV EMUL
INTRAVENOUS | Status: DC | PRN
  Administered 2017-01-11: 75 ug/kg/min via INTRAVENOUS

## 2017-01-11 MED ORDER — PROPOFOL 500 MG/50ML IV EMUL
INTRAVENOUS | Status: DC | PRN
  Administered 2017-01-11 (×2): 30 mg via INTRAVENOUS

## 2017-01-11 MED ORDER — LACTATED RINGERS IV SOLN
INTRAVENOUS | Status: DC
  Administered 2017-01-11: 1000 mL via INTRAVENOUS

## 2017-01-11 SURGICAL SUPPLY — 25 items

## 2017-01-11 NOTE — Op Note (Signed)
[facility] Patient Name: Bruce Hayes Procedure Date: 01/11/2017 MRN: [id number] Attending MD: Milus Banister , MD Date of Birth: [birth year] CSN: [id number] Age: 71 Admit Type: Outpatient Procedure:                Colonoscopy Indications:              High risk colon cancer surveillance: Personal                            history of colonic polyps (one subCM adenoma                            removed 2010) Providers:                Milus Banister, MD, Laverta Baltimore RN, RN,                            Cherylynn Ridges, Technician, Herbie Drape, CRNA Referring MD:              Medicines:                Monitored Anesthesia Care Complications:            No immediate complications. Estimated blood loss:                            None. Estimated Blood Loss:     Estimated blood loss: none. Procedure:                Pre-Anesthesia Assessment:                           - Prior to the procedure, a History and Physical                            was performed, and patient medications and                            allergies were reviewed. The patient's tolerance of                            previous anesthesia was also reviewed. The risks                            and benefits of the procedure and the sedation                            options and risks were discussed with the patient.                            All questions were answered, and informed consent                            was obtained. Prior Anticoagulants: The patient has                            taken no  previous anticoagulant or antiplatelet                            agents. ASA Grade Assessment: IV - A patient with                            severe systemic disease that is a constant threat                            to life. After reviewing the risks and benefits,                            the patient was deemed in satisfactory condition to                            undergo the  procedure.                           After obtaining informed consent, the colonoscope                            was passed under direct vision. Throughout the                            procedure, the patient's blood pressure, pulse, and                            oxygen saturations were monitored continuously. The                            EC-[id number] ([id number]) scope was introduced through                            the anus and advanced to the the cecum, identified                            by appendiceal orifice and ileocecal valve. The                            colonoscopy was performed without difficulty. The                            patient tolerated the procedure well. The quality                            of the bowel preparation was good. The ileocecal                            valve, appendiceal orifice, and rectum were                            photographed. Scope In: 8:33:05 AM Scope Out: 8:42:44 AM Scope Withdrawal Time: 0 hours 7 minutes 50 seconds  Total Procedure Duration: 0 hours 9 minutes  39 seconds  Findings:      A 3 mm polyp was found in the ascending colon. The polyp was sessile.       The polyp was removed with a cold snare. Resection and retrieval were       complete.      The exam was otherwise without abnormality on direct and retroflexion       views. Impression:               - One 3 mm polyp in the ascending colon, removed                            with a cold snare. Resected and retrieved.                           - The examination was otherwise normal on direct                            and retroflexion views. Moderate Sedation:      N/A- Per Anesthesia Care Recommendation:           - Patient has a contact number available for                            emergencies. The signs and symptoms of potential                            delayed complications were discussed with the                            patient. Return to normal activities  tomorrow.                            Written discharge instructions were provided to the                            patient.                           - Resume previous diet.                           - Continue present medications.                           You will receive a letter within 2-3 weeks with the                            pathology results and my final recommendations.                           If the polyp(s) is proven to be 'pre-cancerous' on                            pathology, you will need repeat colonoscopy in 5  years. If the polyp(s) is NOT 'precancerous' on                            pathology then you should repeat colon cancer                            screening in 10 years with colonoscopy without need                            for colon cancer screening by any method prior to                            then (including stool testing). Procedure Code(s):        --- Professional ---                           [id number], Colonoscopy, flexible; with removal of                            tumor(s), polyp(s), or other lesion(s) by snare                            technique Diagnosis Code(s):        --- Professional ---                           Z86.010, Personal history of colonic polyps                           D12.2, Benign neoplasm of ascending colon CPT copyright 2016 American Medical Association. All rights reserved. The codes documented in this report are preliminary and upon coder review may  be revised to meet current compliance requirements. Milus Banister, MD 01/11/2017 8:45:23 AM This report has been signed electronically. Number of Addenda: 0

## 2017-01-11 NOTE — Op Note (Signed)
[facility] Patient Name: Bruce Hayes Procedure Date: 01/11/2017 MRN: [id number] Attending MD: Milus Banister , MD Date of Birth: 1945-05-19 CSN: [id number] Age: 71 Admit Type: Outpatient Procedure:                Upper GI endoscopy Indications:              Screening procedure, Cirrhosis rule out esophageal                            varices Providers:                Milus Banister, MD, Laverta Baltimore RN, RN,                            Cherylynn Ridges, Technician, Herbie Drape, CRNA Referring MD:              Medicines:                Monitored Anesthesia Care Complications:            No immediate complications. Estimated blood loss:                            None. Estimated Blood Loss:     Estimated blood loss: none. Procedure:                Pre-Anesthesia Assessment:                           - Prior to the procedure, a History and Physical                            was performed, and patient medications and                            allergies were reviewed. The patient's tolerance of                            previous anesthesia was also reviewed. The risks                            and benefits of the procedure and the sedation                            options and risks were discussed with the patient.                            All questions were answered, and informed consent                            was obtained. Prior Anticoagulants: The patient has                            taken no previous anticoagulant or antiplatelet                            agents. ASA  Grade Assessment: IV - A patient with                            severe systemic disease that is a constant threat                            to life. After reviewing the risks and benefits,                            the patient was deemed in satisfactory condition to                            undergo the procedure.                           After obtaining informed consent, the  endoscope was                            passed under direct vision. Throughout the                            procedure, the patient's blood pressure, pulse, and                            oxygen saturations were monitored continuously. The                            [id number] ([id number]) scope was introduced through the                            mouth, and advanced to the second part of duodenum.                            The upper GI endoscopy was accomplished without                            difficulty. The patient tolerated the procedure                            well. Scope In: Scope Out: Findings:      The esophagus was normal. No esophageal varices.      Mild inflammation characterized by erythema and friability was found in       the gastric antrum. Biopsies were taken with a cold forceps for       histology.      Mild changed of portal gastropathy in the proximal stomach.      No gastric varices.      The examined duodenum was normal. Impression:               - No esophageal or gastric varices.                           - Mild portal gastropathy.                           -  Gastritis, biopsied to check for H. pylori. Moderate Sedation:      N/A- Per Anesthesia Care Recommendation:           - Patient has a contact number available for                            emergencies. The signs and symptoms of potential                            delayed complications were discussed with the                            patient. Return to normal activities tomorrow.                            Written discharge instructions were provided to the                            patient.                           - Resume previous diet.                           - Continue present medications.                           - Await pathology results. Will treat with                            appropriate antibiotics if + for H. pylori.                           - Repeat upper endoscopy in 2 years  for repeat                            variceal screening. Procedure Code(s):        --- Professional ---                           [phone], Esophagogastroduodenoscopy, flexible,                            transoral; with biopsy, single or multiple Diagnosis Code(s):        --- Professional ---                           K29.70, Gastritis, unspecified, without bleeding                           Z13.810, Encounter for screening for upper                            gastrointestinal disorder                           K74.60, Unspecified cirrhosis of liver  CPT copyright 2016 American Medical Association. All rights reserved. The codes documented in this report are preliminary and upon coder review may  be revised to meet current compliance requirements. Milus Banister, MD 01/11/2017 8:57:32 AM This report has been signed electronically. Number of Addenda: 0
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
## 2017-01-11 NOTE — Anesthesia Preprocedure Evaluation (Addendum)
Anesthesia Evaluation  Patient identified by MRN, date of birth, ID band Patient awake    Reviewed: Allergy & Precautions, NPO status , Patient's Chart, lab work & pertinent test results  Airway Mallampati: I  TM Distance: >3 FB Neck ROM: Full    Dental  (+) Edentulous Upper, Edentulous Lower   Pulmonary Current Smoker,    breath sounds clear to auscultation       Cardiovascular hypertension, Pt. on medications (-) angina Rhythm:Regular Rate:Normal     Neuro/Psych negative neurological ROS     GI/Hepatic negative GI ROS, (+) Cirrhosis   Esophageal Varices  substance abuse (cocaine 5d ago)  cocaine use, Hepatitis -, C  Endo/Other  Hypothyroidism   Renal/GU      Musculoskeletal   Abdominal   Peds  Hematology   Anesthesia Other Findings   Reproductive/Obstetrics                            Anesthesia Physical Anesthesia Plan  ASA: III  Anesthesia Plan: MAC   Post-op Pain Management:    Induction:   PONV Risk Score and Plan: 0 and Treatment may vary due to age or medical condition  Airway Management Planned: Natural Airway and Nasal Cannula  Additional Equipment:   Intra-op Plan:   Post-operative Plan:   Informed Consent: I have reviewed the patients History and Physical, chart, labs and discussed the procedure including the risks, benefits and alternatives for the proposed anesthesia with the patient or authorized representative who has indicated his/her understanding and acceptance.     Plan Discussed with: CRNA and Surgeon  Anesthesia Plan Comments: (Plan routine monitors, MAC)        Anesthesia Quick Evaluation

## 2017-01-11 NOTE — Transfer of Care (Signed)
Immediate Anesthesia Transfer of Care Note  Patient: Bruce Hayes  Procedure(s) Performed: COLONOSCOPY WITH PROPOFOL (N/A ) ESOPHAGOGASTRODUODENOSCOPY (EGD) WITH PROPOFOL (N/A )  Patient Location: PACU  Anesthesia Type:MAC  Level of Consciousness: sedated, patient cooperative and responds to stimulation  Airway & Oxygen Therapy: Patient Spontanous Breathing and Patient connected to nasal cannula oxygen  Post-op Assessment: Report given to RN and Post -op Vital signs reviewed and stable  Post vital signs: Reviewed and stable  Last Vitals:  Vitals:   01/11/17 0756  BP: (!) 168/80  Pulse: 76  Resp: 17  Temp: 36.6 C  SpO2: 100%    Last Pain:  Vitals:   01/11/17 0756  TempSrc: Oral         Complications: No apparent anesthesia complications

## 2017-01-11 NOTE — Discharge Instructions (Signed)
Colonoscopy, Adult, Care After °This sheet gives you information about how to care for yourself after your procedure. Your doctor may also give you more specific instructions. If you have problems or questions, call your doctor. °Follow these instructions at home: °General instructions ° °· For the first 24 hours after the procedure: °? Do not drive or use machinery. °? Do not sign important documents. °? Do not drink alcohol. °? Do your daily activities more slowly than normal. °? Eat foods that are soft and easy to digest. °? Rest often. °· Take over-the-counter or prescription medicines only as told by your doctor. °· It is up to you to get the results of your procedure. Ask your doctor, or the department performing the procedure, when your results will be ready. °To help cramping and bloating: °· Try walking around. °· Put heat on your belly (abdomen) as told by your doctor. Use a heat source that your doctor recommends, such as a moist heat pack or a heating pad. °? Put a towel between your skin and the heat source. °? Leave the heat on for 20-30 minutes. °? Remove the heat if your skin turns bright red. This is especially important if you cannot feel pain, heat, or cold. You can get burned. °Eating and drinking °· Drink enough fluid to keep your pee (urine) clear or pale yellow. °· Return to your normal diet as told by your doctor. Avoid heavy or fried foods that are hard to digest. °· Avoid drinking alcohol for as long as told by your doctor. °Contact a doctor if: °· You have blood in your poop (stool) 2-3 days after the procedure. °Get help right away if: °· You have more than a small amount of blood in your poop. °· You see large clumps of tissue (blood clots) in your poop. °· Your belly is swollen. °· You feel sick to your stomach (nauseous). °· You throw up (vomit). °· You have a fever. °· You have belly pain that gets worse, and medicine does not help your pain. °This information is not intended to  replace advice given to you by your health care provider. Make sure you discuss any questions you have with your health care provider. °Document Released: 02/18/2010 Document Revised: 10/11/2015 Document Reviewed: 10/11/2015 °Elsevier Interactive Patient Education © 2017 [employer] °Esophagogastroduodenoscopy, Care After °Refer to this sheet in the next few weeks. These instructions provide you with information about caring for yourself after your procedure. Your health care provider may also give you more specific instructions. Your treatment has been planned according to current medical practices, but problems sometimes occur. Call your health care provider if you have any problems or questions after your procedure. °What can I expect after the procedure? °After the procedure, it is common to have: °· A sore throat. °· Nausea. °· Bloating. °· Dizziness. °· Fatigue. ° °Follow these instructions at home: °· Do not eat or drink anything until the numbing medicine (local anesthetic) has worn off and your gag reflex has returned. You will know that the local anesthetic has worn off when you can swallow comfortably. °· Do not drive for 24 hours if you received a medicine to help you relax (sedative). °· If your health care provider took a tissue sample for testing during the procedure, make sure to get your test results. This is your responsibility. Ask your health care provider or the department performing the test when your results will be ready. °· Keep all follow-up visits as told   by your health care provider. This is important. °Contact a health care provider if: °· You cannot stop coughing. °· You are not urinating. °· You are urinating less than usual. °Get help right away if: °· You have trouble swallowing. °· You cannot eat or drink. °· You have throat or chest pain that gets worse. °· You are dizzy or light-headed. °· You faint. °· You have nausea or vomiting. °· You have chills. °· You have a fever. °· You  have severe abdominal pain. °· You have black, tarry, or bloody stools. °This information is not intended to replace advice given to you by your health care provider. Make sure you discuss any questions you have with your health care provider. °Document Released: 01/03/2012 Document Revised: 06/24/2015 Document Reviewed: 12/10/2014 °Elsevier Interactive Patient Education © 2018 [employer] ° °

## 2017-01-11 NOTE — Anesthesia Postprocedure Evaluation (Signed)
Anesthesia Post Note  Patient: Bruce Hayes  Procedure(s) Performed: COLONOSCOPY WITH PROPOFOL (N/A ) ESOPHAGOGASTRODUODENOSCOPY (EGD) WITH PROPOFOL (N/A )     Patient location during evaluation: Endoscopy Anesthesia Type: MAC Level of consciousness: awake and alert, oriented and patient cooperative Pain management: pain level controlled Vital Signs Assessment: post-procedure vital signs reviewed and stable Respiratory status: spontaneous breathing, nonlabored ventilation and respiratory function stable Postop Assessment: no headache, no apparent nausea or vomiting, adequate PO intake and patient able to bend at knees Anesthetic complications: no    Last Vitals:  Vitals:   01/11/17 0910 01/11/17 0920  BP: (!) 157/72 (!) 165/81  Pulse: 82 76  Resp: 12 14  Temp:    SpO2: 100% 100%    Last Pain:  Vitals:   01/11/17 0756  TempSrc: Oral                 Autrey Human,E. Mohini Heathcock

## 2017-01-12 ENCOUNTER — Encounter (HOSPITAL_COMMUNITY): Payer: Self-pay | Admitting: Gastroenterology

## 2017-01-15 ENCOUNTER — Other Ambulatory Visit: Payer: Self-pay | Admitting: Internal Medicine

## 2017-01-15 ENCOUNTER — Ambulatory Visit
Admission: RE | Admit: 2017-01-15 | Discharge: 2017-01-15 | Disposition: A | Discharge status: Home / Self Care | Payer: Medicare Other | Source: Ambulatory Visit | Attending: Internal Medicine | Admitting: Internal Medicine

## 2017-01-15 DIAGNOSIS — S93402A Sprain of unspecified ligament of left ankle, initial encounter: Secondary | ICD-10-CM

## 2017-01-16 ENCOUNTER — Other Ambulatory Visit: Payer: Self-pay | Admitting: Gastroenterology

## 2017-01-16 ENCOUNTER — Other Ambulatory Visit: Payer: Self-pay

## 2017-01-16 MED ORDER — OMEPRAZOLE 20 MG PO CPDR: 20.0000 mg | DELAYED_RELEASE_CAPSULE | Freq: Two times a day (BID) | ORAL | 0 refills | Status: DC

## 2017-01-16 MED ORDER — BIS SUBCIT-METRONID-TETRACYC 140-125-125 MG PO CAPS: 3.0000 | ORAL_CAPSULE | Freq: Three times a day (TID) | ORAL | 0 refills | Status: DC

## 2017-05-08 ENCOUNTER — Emergency Department (HOSPITAL_COMMUNITY)
Admission: EM | Admit: 2017-05-08 | Discharge: 2017-05-08 | Disposition: A | Discharge status: Home / Self Care | Payer: Medicare Other | Attending: Emergency Medicine | Admitting: Emergency Medicine

## 2017-05-08 ENCOUNTER — Encounter (HOSPITAL_COMMUNITY): Payer: Self-pay

## 2017-05-08 ENCOUNTER — Emergency Department (HOSPITAL_COMMUNITY): Payer: Medicare Other

## 2017-05-08 ENCOUNTER — Other Ambulatory Visit: Payer: Self-pay

## 2017-05-08 DIAGNOSIS — I2510 Atherosclerotic heart disease of native coronary artery without angina pectoris: Secondary | ICD-10-CM | POA: Diagnosis not present

## 2017-05-08 DIAGNOSIS — I129 Hypertensive chronic kidney disease with stage 1 through stage 4 chronic kidney disease, or unspecified chronic kidney disease: Secondary | ICD-10-CM | POA: Insufficient documentation

## 2017-05-08 DIAGNOSIS — N189 Chronic kidney disease, unspecified: Secondary | ICD-10-CM | POA: Diagnosis not present

## 2017-05-08 DIAGNOSIS — Z79899 Other long term (current) drug therapy: Secondary | ICD-10-CM | POA: Insufficient documentation

## 2017-05-08 DIAGNOSIS — F17210 Nicotine dependence, cigarettes, uncomplicated: Secondary | ICD-10-CM | POA: Diagnosis not present

## 2017-05-08 DIAGNOSIS — M25561 Pain in right knee: Secondary | ICD-10-CM

## 2017-05-08 DIAGNOSIS — M79641 Pain in right hand: Secondary | ICD-10-CM | POA: Insufficient documentation

## 2017-05-08 DIAGNOSIS — G8929 Other chronic pain: Secondary | ICD-10-CM | POA: Insufficient documentation

## 2017-05-08 HISTORY — DX: Other psychoactive substance abuse, uncomplicated: F19.10

## 2017-05-08 HISTORY — DX: Gout, unspecified: M10.9

## 2017-05-08 MED ORDER — DOXYCYCLINE HYCLATE 100 MG PO CAPS: 100.0000 mg | ORAL_CAPSULE | Freq: Two times a day (BID) | ORAL | 0 refills | Status: AC

## 2017-05-08 MED ORDER — PREDNISONE 20 MG PO TABS: 40.0000 mg | ORAL_TABLET | Freq: Every day | ORAL | 0 refills | Status: AC
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
## 2017-05-08 NOTE — Discharge Instructions (Addendum)
Today I am unable to give you ibuprofen for your pain due to your history of kidney problems, I am unable to give you Tylenol for your pain due to your history of liver problems.  Your symptoms today do not appear to require stronger or narcotic pain medicine.  Please follow-up with your primary care provider regarding your ED visit and symptoms today.  If your symptoms get worse, you develop fevers, nausea vomiting diarrhea, chills, or you have additional concerns that you may be worsening please seek additional medical care.  I have given you a prescription for steroids today.  Some common side effects include feelings of extra energy, feeling warm, increased appetite, and stomach upset.  If you are diabetic your sugars may run higher than usual.   You may have diarrhea from the antibiotics.  It is very important that you continue to take the antibiotics even if you get diarrhea unless a medical professional tells you that you may stop taking them.  If you stop too early the bacteria you are being treated for will become stronger and you may need different, more powerful antibiotics that have more side effects and worsening diarrhea.  Please stay well hydrated and consider probiotics as they may decrease the severity of your diarrhea.    While in the ED your blood pressure was high.  Please follow up with your primary care doctor or the wellness clinic for repeat evaluation as you may need medication.  High blood pressure can cause long term, potentially serious, damage if left untreated.
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
## 2017-05-22 ENCOUNTER — Other Ambulatory Visit: Payer: Self-pay | Admitting: Nurse Practitioner

## 2017-05-22 DIAGNOSIS — K7469 Other cirrhosis of liver: Secondary | ICD-10-CM

## 2017-06-05 ENCOUNTER — Other Ambulatory Visit: Payer: Self-pay

## 2017-06-15 ENCOUNTER — Ambulatory Visit
Admission: RE | Admit: 2017-06-15 | Discharge: 2017-06-15 | Disposition: A | Discharge status: Home / Self Care | Payer: Medicare Other | Source: Ambulatory Visit | Attending: Nurse Practitioner | Admitting: Nurse Practitioner

## 2017-06-15 DIAGNOSIS — K7469 Other cirrhosis of liver: Secondary | ICD-10-CM

## 2017-12-06 ENCOUNTER — Other Ambulatory Visit: Payer: Self-pay | Admitting: Internal Medicine

## 2017-12-06 ENCOUNTER — Other Ambulatory Visit: Payer: Self-pay

## 2017-12-06 ENCOUNTER — Encounter (HOSPITAL_COMMUNITY): Payer: Self-pay | Admitting: Emergency Medicine

## 2017-12-06 ENCOUNTER — Emergency Department (HOSPITAL_COMMUNITY): Payer: Medicare Other

## 2017-12-06 ENCOUNTER — Emergency Department (HOSPITAL_COMMUNITY)
Admission: EM | Admit: 2017-12-06 | Discharge: 2017-12-06 | Disposition: A | Discharge status: Home / Self Care | Payer: Medicare Other | Attending: Emergency Medicine | Admitting: Emergency Medicine

## 2017-12-06 ENCOUNTER — Ambulatory Visit
Admission: RE | Admit: 2017-12-06 | Discharge: 2017-12-06 | Disposition: A | Discharge status: Home / Self Care | Payer: Medicare Other | Source: Ambulatory Visit | Attending: Internal Medicine | Admitting: Internal Medicine

## 2017-12-06 DIAGNOSIS — Y9389 Activity, other specified: Secondary | ICD-10-CM | POA: Diagnosis not present

## 2017-12-06 DIAGNOSIS — Y929 Unspecified place or not applicable: Secondary | ICD-10-CM | POA: Diagnosis not present

## 2017-12-06 DIAGNOSIS — F329 Major depressive disorder, single episode, unspecified: Secondary | ICD-10-CM | POA: Diagnosis not present

## 2017-12-06 DIAGNOSIS — F17210 Nicotine dependence, cigarettes, uncomplicated: Secondary | ICD-10-CM | POA: Diagnosis not present

## 2017-12-06 DIAGNOSIS — I2510 Atherosclerotic heart disease of native coronary artery without angina pectoris: Secondary | ICD-10-CM | POA: Diagnosis not present

## 2017-12-06 DIAGNOSIS — F1410 Cocaine abuse, uncomplicated: Secondary | ICD-10-CM | POA: Diagnosis not present

## 2017-12-06 DIAGNOSIS — N189 Chronic kidney disease, unspecified: Secondary | ICD-10-CM | POA: Insufficient documentation

## 2017-12-06 DIAGNOSIS — Z79899 Other long term (current) drug therapy: Secondary | ICD-10-CM | POA: Insufficient documentation

## 2017-12-06 DIAGNOSIS — E039 Hypothyroidism, unspecified: Secondary | ICD-10-CM | POA: Diagnosis not present

## 2017-12-06 DIAGNOSIS — S9002XA Contusion of left ankle, initial encounter: Secondary | ICD-10-CM | POA: Diagnosis not present

## 2017-12-06 DIAGNOSIS — M25561 Pain in right knee: Secondary | ICD-10-CM

## 2017-12-06 DIAGNOSIS — W101XXA Fall (on)(from) sidewalk curb, initial encounter: Secondary | ICD-10-CM | POA: Diagnosis not present

## 2017-12-06 DIAGNOSIS — F1020 Alcohol dependence, uncomplicated: Secondary | ICD-10-CM | POA: Diagnosis not present

## 2017-12-06 DIAGNOSIS — S8012XA Contusion of left lower leg, initial encounter: Secondary | ICD-10-CM

## 2017-12-06 DIAGNOSIS — I129 Hypertensive chronic kidney disease with stage 1 through stage 4 chronic kidney disease, or unspecified chronic kidney disease: Secondary | ICD-10-CM | POA: Diagnosis not present

## 2017-12-06 DIAGNOSIS — Y998 Other external cause status: Secondary | ICD-10-CM | POA: Diagnosis not present

## 2017-12-06 DIAGNOSIS — S99912A Unspecified injury of left ankle, initial encounter: Secondary | ICD-10-CM | POA: Diagnosis present

## 2017-12-06 MED ORDER — IBUPROFEN 400 MG PO TABS
400.0000 mg | ORAL_TABLET | Freq: Once | ORAL | Status: AC
  Administered 2017-12-06: 400 mg via ORAL
  Filled 2017-12-06: qty 1
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
## 2017-12-06 NOTE — Discharge Instructions (Addendum)
There was no indication for a broken bone or serious ankle sprain.  Use the Ace wrap as needed for comfort.  Also, use ice and sore area 3 or 4 times a day for 30 minutes.  For pain use ibuprofen 400 mg 3 times a day.  See your primary care doctor if needed for problems.

## 2017-12-06 NOTE — ED Notes (Signed)
Patient transported to X-ray 
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
## 2017-12-06 NOTE — ED Notes (Signed)
Pt ambulated from restroom to hallway bed no difficulties.

## 2017-12-14 ENCOUNTER — Other Ambulatory Visit: Payer: Self-pay | Admitting: Nurse Practitioner

## 2017-12-14 DIAGNOSIS — K7469 Other cirrhosis of liver: Secondary | ICD-10-CM

## 2017-12-26 ENCOUNTER — Other Ambulatory Visit: Payer: Self-pay

## 2018-01-02 ENCOUNTER — Other Ambulatory Visit: Payer: Self-pay

## 2018-01-14 ENCOUNTER — Ambulatory Visit
Admission: RE | Admit: 2018-01-14 | Discharge: 2018-01-14 | Disposition: A | Discharge status: Home / Self Care | Payer: Medicare Other | Source: Ambulatory Visit | Attending: Nurse Practitioner | Admitting: Nurse Practitioner

## 2018-01-14 DIAGNOSIS — K7469 Other cirrhosis of liver: Secondary | ICD-10-CM
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
## 2018-06-17 ENCOUNTER — Other Ambulatory Visit: Payer: Self-pay | Admitting: Nurse Practitioner

## 2018-06-17 DIAGNOSIS — K7469 Other cirrhosis of liver: Secondary | ICD-10-CM

## 2018-06-28 ENCOUNTER — Ambulatory Visit
Admission: RE | Admit: 2018-06-28 | Discharge: 2018-06-28 | Disposition: A | Discharge status: Home / Self Care | Payer: Medicare Other | Source: Ambulatory Visit | Attending: Nurse Practitioner | Admitting: Nurse Practitioner

## 2018-06-28 DIAGNOSIS — K7469 Other cirrhosis of liver: Secondary | ICD-10-CM

## 2018-07-17 ENCOUNTER — Other Ambulatory Visit: Payer: Self-pay | Admitting: Nurse Practitioner

## 2018-07-17 DIAGNOSIS — K7469 Other cirrhosis of liver: Secondary | ICD-10-CM

## 2018-07-17 DIAGNOSIS — R772 Abnormality of alphafetoprotein: Secondary | ICD-10-CM

## 2018-08-06 ENCOUNTER — Other Ambulatory Visit: Payer: Self-pay

## 2018-08-06 ENCOUNTER — Ambulatory Visit
Admission: RE | Admit: 2018-08-06 | Discharge: 2018-08-06 | Disposition: A | Discharge status: Home / Self Care | Payer: Medicare Other | Source: Ambulatory Visit | Attending: Nurse Practitioner | Admitting: Nurse Practitioner

## 2018-08-06 DIAGNOSIS — R772 Abnormality of alphafetoprotein: Secondary | ICD-10-CM

## 2018-08-06 DIAGNOSIS — K7469 Other cirrhosis of liver: Secondary | ICD-10-CM

## 2018-09-14 ENCOUNTER — Inpatient Hospital Stay: Admission: RE | Admit: 2018-09-14 | Payer: Medicare Other | Source: Ambulatory Visit

## 2018-09-14 ENCOUNTER — Other Ambulatory Visit: Payer: Medicare Other

## 2018-10-18 ENCOUNTER — Other Ambulatory Visit: Payer: Self-pay

## 2018-10-18 ENCOUNTER — Ambulatory Visit
Admission: RE | Admit: 2018-10-18 | Discharge: 2018-10-18 | Disposition: A | Discharge status: Home / Self Care | Payer: Medicare Other | Source: Ambulatory Visit | Attending: Nurse Practitioner | Admitting: Nurse Practitioner

## 2018-10-18 DIAGNOSIS — K7469 Other cirrhosis of liver: Secondary | ICD-10-CM

## 2018-10-18 DIAGNOSIS — R772 Abnormality of alphafetoprotein: Secondary | ICD-10-CM

## 2018-10-18 MED ORDER — GADOBENATE DIMEGLUMINE 529 MG/ML IV SOLN
20.0000 mL | Freq: Once | INTRAVENOUS | Status: AC | PRN
  Administered 2018-10-18: 20 mL via INTRAVENOUS

## 2018-10-30 ENCOUNTER — Other Ambulatory Visit: Payer: Self-pay

## 2018-10-30 ENCOUNTER — Encounter (HOSPITAL_COMMUNITY): Payer: Self-pay | Admitting: Emergency Medicine

## 2018-10-30 ENCOUNTER — Inpatient Hospital Stay (HOSPITAL_COMMUNITY)
Admission: EM | Admit: 2018-10-30 | Discharge: 2018-11-05 | DRG: 432 | Disposition: A | Discharge status: Skilled Nursing Facility | Payer: Medicare Other | Attending: Internal Medicine | Admitting: Internal Medicine

## 2018-10-30 ENCOUNTER — Emergency Department (HOSPITAL_COMMUNITY): Payer: Medicare Other

## 2018-10-30 DIAGNOSIS — I160 Hypertensive urgency: Secondary | ICD-10-CM | POA: Diagnosis present

## 2018-10-30 DIAGNOSIS — D638 Anemia in other chronic diseases classified elsewhere: Secondary | ICD-10-CM | POA: Diagnosis present

## 2018-10-30 DIAGNOSIS — N179 Acute kidney failure, unspecified: Secondary | ICD-10-CM | POA: Diagnosis present

## 2018-10-30 DIAGNOSIS — E039 Hypothyroidism, unspecified: Secondary | ICD-10-CM | POA: Diagnosis present

## 2018-10-30 DIAGNOSIS — K7040 Alcoholic hepatic failure without coma: Secondary | ICD-10-CM | POA: Diagnosis not present

## 2018-10-30 DIAGNOSIS — K3189 Other diseases of stomach and duodenum: Secondary | ICD-10-CM | POA: Diagnosis present

## 2018-10-30 DIAGNOSIS — I10 Essential (primary) hypertension: Secondary | ICD-10-CM | POA: Diagnosis present

## 2018-10-30 DIAGNOSIS — Z79890 Hormone replacement therapy (postmenopausal): Secondary | ICD-10-CM

## 2018-10-30 DIAGNOSIS — F1410 Cocaine abuse, uncomplicated: Secondary | ICD-10-CM | POA: Diagnosis present

## 2018-10-30 DIAGNOSIS — B1920 Unspecified viral hepatitis C without hepatic coma: Secondary | ICD-10-CM | POA: Diagnosis present

## 2018-10-30 DIAGNOSIS — F1910 Other psychoactive substance abuse, uncomplicated: Secondary | ICD-10-CM | POA: Diagnosis present

## 2018-10-30 DIAGNOSIS — K766 Portal hypertension: Secondary | ICD-10-CM | POA: Diagnosis present

## 2018-10-30 DIAGNOSIS — E872 Acidosis: Secondary | ICD-10-CM | POA: Diagnosis present

## 2018-10-30 DIAGNOSIS — E785 Hyperlipidemia, unspecified: Secondary | ICD-10-CM | POA: Diagnosis present

## 2018-10-30 DIAGNOSIS — I129 Hypertensive chronic kidney disease with stage 1 through stage 4 chronic kidney disease, or unspecified chronic kidney disease: Secondary | ICD-10-CM | POA: Diagnosis present

## 2018-10-30 DIAGNOSIS — F17200 Nicotine dependence, unspecified, uncomplicated: Secondary | ICD-10-CM | POA: Diagnosis present

## 2018-10-30 DIAGNOSIS — R410 Disorientation, unspecified: Secondary | ICD-10-CM

## 2018-10-30 DIAGNOSIS — Y900 Blood alcohol level of less than 20 mg/100 ml: Secondary | ICD-10-CM | POA: Diagnosis present

## 2018-10-30 DIAGNOSIS — M109 Gout, unspecified: Secondary | ICD-10-CM | POA: Diagnosis present

## 2018-10-30 DIAGNOSIS — M6282 Rhabdomyolysis: Secondary | ICD-10-CM | POA: Diagnosis present

## 2018-10-30 DIAGNOSIS — K7469 Other cirrhosis of liver: Secondary | ICD-10-CM | POA: Diagnosis present

## 2018-10-30 DIAGNOSIS — E870 Hyperosmolality and hypernatremia: Secondary | ICD-10-CM | POA: Diagnosis present

## 2018-10-30 DIAGNOSIS — F1020 Alcohol dependence, uncomplicated: Secondary | ICD-10-CM | POA: Diagnosis present

## 2018-10-30 DIAGNOSIS — I2510 Atherosclerotic heart disease of native coronary artery without angina pectoris: Secondary | ICD-10-CM | POA: Diagnosis present

## 2018-10-30 DIAGNOSIS — K7682 Hepatic encephalopathy: Secondary | ICD-10-CM

## 2018-10-30 DIAGNOSIS — Z800 Family history of malignant neoplasm of digestive organs: Secondary | ICD-10-CM

## 2018-10-30 DIAGNOSIS — Z8261 Family history of arthritis: Secondary | ICD-10-CM

## 2018-10-30 DIAGNOSIS — K7290 Hepatic failure, unspecified without coma: Secondary | ICD-10-CM | POA: Diagnosis not present

## 2018-10-30 DIAGNOSIS — Z8249 Family history of ischemic heart disease and other diseases of the circulatory system: Secondary | ICD-10-CM

## 2018-10-30 DIAGNOSIS — Y92019 Unspecified place in single-family (private) house as the place of occurrence of the external cause: Secondary | ICD-10-CM

## 2018-10-30 DIAGNOSIS — Z20828 Contact with and (suspected) exposure to other viral communicable diseases: Secondary | ICD-10-CM | POA: Diagnosis present

## 2018-10-30 DIAGNOSIS — E669 Obesity, unspecified: Secondary | ICD-10-CM | POA: Diagnosis present

## 2018-10-30 DIAGNOSIS — Z6832 Body mass index (BMI) 32.0-32.9, adult: Secondary | ICD-10-CM

## 2018-10-30 DIAGNOSIS — F17210 Nicotine dependence, cigarettes, uncomplicated: Secondary | ICD-10-CM | POA: Diagnosis present

## 2018-10-30 DIAGNOSIS — D6959 Other secondary thrombocytopenia: Secondary | ICD-10-CM | POA: Diagnosis present

## 2018-10-30 DIAGNOSIS — Z886 Allergy status to analgesic agent status: Secondary | ICD-10-CM

## 2018-10-30 DIAGNOSIS — G9341 Metabolic encephalopathy: Secondary | ICD-10-CM | POA: Diagnosis present

## 2018-10-30 DIAGNOSIS — Z825 Family history of asthma and other chronic lower respiratory diseases: Secondary | ICD-10-CM

## 2018-10-30 DIAGNOSIS — N183 Chronic kidney disease, stage 3 unspecified: Secondary | ICD-10-CM | POA: Diagnosis present

## 2018-10-30 DIAGNOSIS — K7030 Alcoholic cirrhosis of liver without ascites: Secondary | ICD-10-CM | POA: Diagnosis present

## 2018-10-30 DIAGNOSIS — R4182 Altered mental status, unspecified: Secondary | ICD-10-CM

## 2018-10-30 DIAGNOSIS — W19XXXA Unspecified fall, initial encounter: Secondary | ICD-10-CM | POA: Diagnosis present

## 2018-10-30 LAB — COMPREHENSIVE METABOLIC PANEL
ALT: 56 U/L — ABNORMAL HIGH (ref 0–44)
AST: 128 U/L — ABNORMAL HIGH (ref 15–41)
Albumin: 2.3 g/dL — ABNORMAL LOW (ref 3.5–5.0)
Alkaline Phosphatase: 154 U/L — ABNORMAL HIGH (ref 38–126)
Anion gap: 10 (ref 5–15)
BUN: 29 mg/dL — ABNORMAL HIGH (ref 8–23)
CO2: 16 mmol/L — ABNORMAL LOW (ref 22–32)
Calcium: 8.3 mg/dL — ABNORMAL LOW (ref 8.9–10.3)
Chloride: 120 mmol/L — ABNORMAL HIGH (ref 98–111)
Creatinine, Ser: 2.61 mg/dL — ABNORMAL HIGH (ref 0.61–1.24)
GFR calc Af Amer: 27 mL/min — ABNORMAL LOW (ref >60)
GFR calc non Af Amer: 24 mL/min — ABNORMAL LOW (ref >60)
Glucose, Bld: 90 mg/dL (ref 70–99)
Potassium: 4.2 mmol/L (ref 3.5–5.1)
Sodium: 146 mmol/L — ABNORMAL HIGH (ref 135–145)
Total Bilirubin: 1.4 mg/dL — ABNORMAL HIGH (ref 0.3–1.2)
Total Protein: 7.6 g/dL (ref 6.5–8.1)

## 2018-10-30 LAB — CBC WITH DIFFERENTIAL/PLATELET
Abs Immature Granulocytes: 0.03 10*3/uL (ref 0.00–0.07)
Basophils Absolute: 0 10*3/uL (ref 0.0–0.1)
Basophils Relative: 0 %
Eosinophils Absolute: 0.1 10*3/uL (ref 0.0–0.5)
Eosinophils Relative: 1 %
HCT: 32.4 % — ABNORMAL LOW (ref 39.0–52.0)
Hemoglobin: 11.5 g/dL — ABNORMAL LOW (ref 13.0–17.0)
Immature Granulocytes: 1 %
Lymphocytes Relative: 30 %
Lymphs Abs: 1.9 10*3/uL (ref 0.7–4.0)
MCH: 29.8 pg (ref 26.0–34.0)
MCHC: 35.5 g/dL (ref 30.0–36.0)
MCV: 83.9 fL (ref 80.0–100.0)
Monocytes Absolute: 0.9 10*3/uL (ref 0.1–1.0)
Monocytes Relative: 14 %
Neutro Abs: 3.4 10*3/uL (ref 1.7–7.7)
Neutrophils Relative %: 54 %
Platelets: 99 10*3/uL — ABNORMAL LOW (ref 150–400)
RBC: 3.86 MIL/uL — ABNORMAL LOW (ref 4.22–5.81)
RDW: 16 % — ABNORMAL HIGH (ref 11.5–15.5)
WBC: 6.3 10*3/uL (ref 4.0–10.5)
nRBC: 0 % (ref 0.0–0.2)

## 2018-10-30 LAB — AMMONIA: Ammonia: 82 umol/L — ABNORMAL HIGH (ref 9–35)

## 2018-10-30 LAB — ETHANOL: Alcohol, Ethyl (B): <10 mg/dL (ref <10)

## 2018-10-30 MED ORDER — LACTULOSE 10 GM/15ML PO SOLN
30.0000 g | Freq: Once | ORAL | Status: AC
  Administered 2018-10-31: 30 g via ORAL
  Filled 2018-10-30: qty 45
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
## 2018-10-31 ENCOUNTER — Observation Stay (HOSPITAL_COMMUNITY): Payer: Medicare Other

## 2018-10-31 ENCOUNTER — Encounter (HOSPITAL_COMMUNITY): Payer: Self-pay | Admitting: Internal Medicine

## 2018-10-31 DIAGNOSIS — M109 Gout, unspecified: Secondary | ICD-10-CM | POA: Diagnosis present

## 2018-10-31 DIAGNOSIS — F17210 Nicotine dependence, cigarettes, uncomplicated: Secondary | ICD-10-CM | POA: Diagnosis present

## 2018-10-31 DIAGNOSIS — K7040 Alcoholic hepatic failure without coma: Secondary | ICD-10-CM | POA: Diagnosis present

## 2018-10-31 DIAGNOSIS — I160 Hypertensive urgency: Secondary | ICD-10-CM

## 2018-10-31 DIAGNOSIS — K7290 Hepatic failure, unspecified without coma: Secondary | ICD-10-CM | POA: Diagnosis present

## 2018-10-31 DIAGNOSIS — G9341 Metabolic encephalopathy: Secondary | ICD-10-CM | POA: Diagnosis present

## 2018-10-31 DIAGNOSIS — Z825 Family history of asthma and other chronic lower respiratory diseases: Secondary | ICD-10-CM | POA: Diagnosis not present

## 2018-10-31 DIAGNOSIS — K7030 Alcoholic cirrhosis of liver without ascites: Secondary | ICD-10-CM | POA: Diagnosis present

## 2018-10-31 DIAGNOSIS — I10 Essential (primary) hypertension: Secondary | ICD-10-CM | POA: Diagnosis not present

## 2018-10-31 DIAGNOSIS — B1920 Unspecified viral hepatitis C without hepatic coma: Secondary | ICD-10-CM | POA: Diagnosis present

## 2018-10-31 DIAGNOSIS — E872 Acidosis: Secondary | ICD-10-CM | POA: Diagnosis present

## 2018-10-31 DIAGNOSIS — Z8261 Family history of arthritis: Secondary | ICD-10-CM | POA: Diagnosis not present

## 2018-10-31 DIAGNOSIS — Z79890 Hormone replacement therapy (postmenopausal): Secondary | ICD-10-CM | POA: Diagnosis not present

## 2018-10-31 DIAGNOSIS — Y900 Blood alcohol level of less than 20 mg/100 ml: Secondary | ICD-10-CM | POA: Diagnosis present

## 2018-10-31 DIAGNOSIS — Y92019 Unspecified place in single-family (private) house as the place of occurrence of the external cause: Secondary | ICD-10-CM | POA: Diagnosis not present

## 2018-10-31 DIAGNOSIS — I129 Hypertensive chronic kidney disease with stage 1 through stage 4 chronic kidney disease, or unspecified chronic kidney disease: Secondary | ICD-10-CM | POA: Diagnosis present

## 2018-10-31 DIAGNOSIS — Z20828 Contact with and (suspected) exposure to other viral communicable diseases: Secondary | ICD-10-CM | POA: Diagnosis present

## 2018-10-31 DIAGNOSIS — Z800 Family history of malignant neoplasm of digestive organs: Secondary | ICD-10-CM | POA: Diagnosis not present

## 2018-10-31 DIAGNOSIS — E870 Hyperosmolality and hypernatremia: Secondary | ICD-10-CM | POA: Diagnosis present

## 2018-10-31 DIAGNOSIS — N183 Chronic kidney disease, stage 3 unspecified: Secondary | ICD-10-CM | POA: Diagnosis present

## 2018-10-31 DIAGNOSIS — K3189 Other diseases of stomach and duodenum: Secondary | ICD-10-CM

## 2018-10-31 DIAGNOSIS — K7682 Hepatic encephalopathy: Secondary | ICD-10-CM | POA: Diagnosis present

## 2018-10-31 DIAGNOSIS — Z8249 Family history of ischemic heart disease and other diseases of the circulatory system: Secondary | ICD-10-CM | POA: Diagnosis not present

## 2018-10-31 DIAGNOSIS — E785 Hyperlipidemia, unspecified: Secondary | ICD-10-CM | POA: Diagnosis present

## 2018-10-31 DIAGNOSIS — W19XXXA Unspecified fall, initial encounter: Secondary | ICD-10-CM | POA: Diagnosis present

## 2018-10-31 DIAGNOSIS — F17200 Nicotine dependence, unspecified, uncomplicated: Secondary | ICD-10-CM

## 2018-10-31 DIAGNOSIS — N179 Acute kidney failure, unspecified: Secondary | ICD-10-CM | POA: Diagnosis present

## 2018-10-31 DIAGNOSIS — F1910 Other psychoactive substance abuse, uncomplicated: Secondary | ICD-10-CM

## 2018-10-31 DIAGNOSIS — N1831 Chronic kidney disease, stage 3a: Secondary | ICD-10-CM | POA: Diagnosis not present

## 2018-10-31 DIAGNOSIS — I2510 Atherosclerotic heart disease of native coronary artery without angina pectoris: Secondary | ICD-10-CM | POA: Diagnosis present

## 2018-10-31 DIAGNOSIS — E039 Hypothyroidism, unspecified: Secondary | ICD-10-CM | POA: Diagnosis present

## 2018-10-31 DIAGNOSIS — Z886 Allergy status to analgesic agent status: Secondary | ICD-10-CM | POA: Diagnosis not present

## 2018-10-31 DIAGNOSIS — K766 Portal hypertension: Secondary | ICD-10-CM

## 2018-10-31 DIAGNOSIS — M6282 Rhabdomyolysis: Secondary | ICD-10-CM | POA: Diagnosis present

## 2018-10-31 LAB — URINALYSIS, ROUTINE W REFLEX MICROSCOPIC
Bacteria, UA: NONE SEEN
Bilirubin Urine: NEGATIVE
Glucose, UA: NEGATIVE mg/dL
Ketones, ur: NEGATIVE mg/dL
Leukocytes,Ua: NEGATIVE
Nitrite: NEGATIVE
Protein, ur: 100 mg/dL — AB
Specific Gravity, Urine: 1.015 (ref 1.005–1.030)
pH: 5 (ref 5.0–8.0)

## 2018-10-31 LAB — BASIC METABOLIC PANEL
Anion gap: 10 (ref 5–15)
BUN: 31 mg/dL — ABNORMAL HIGH (ref 8–23)
CO2: 16 mmol/L — ABNORMAL LOW (ref 22–32)
Calcium: 8.4 mg/dL — ABNORMAL LOW (ref 8.9–10.3)
Chloride: 119 mmol/L — ABNORMAL HIGH (ref 98–111)
Creatinine, Ser: 2.52 mg/dL — ABNORMAL HIGH (ref 0.61–1.24)
GFR calc Af Amer: 29 mL/min — ABNORMAL LOW (ref >60)
GFR calc non Af Amer: 25 mL/min — ABNORMAL LOW (ref >60)
Glucose, Bld: 92 mg/dL (ref 70–99)
Potassium: 4.1 mmol/L (ref 3.5–5.1)
Sodium: 145 mmol/L (ref 135–145)

## 2018-10-31 LAB — CBG MONITORING, ED
Glucose-Capillary: 62 mg/dL — ABNORMAL LOW (ref 70–99)
Glucose-Capillary: 84 mg/dL (ref 70–99)
Glucose-Capillary: 114 mg/dL — ABNORMAL HIGH (ref 70–99)

## 2018-10-31 LAB — RAPID URINE DRUG SCREEN, HOSP PERFORMED
Amphetamines: NOT DETECTED
Barbiturates: NOT DETECTED
Benzodiazepines: NOT DETECTED
Cocaine: POSITIVE — AB
Opiates: NOT DETECTED
Tetrahydrocannabinol: NOT DETECTED

## 2018-10-31 LAB — URINE CULTURE: Culture: NO GROWTH

## 2018-10-31 LAB — IRON AND TIBC
Iron: 77 ug/dL (ref 45–182)
Saturation Ratios: 27 % (ref 17.9–39.5)
TIBC: 286 ug/dL (ref 250–450)
UIBC: 209 ug/dL

## 2018-10-31 LAB — FERRITIN: Ferritin: 132 ng/mL (ref 24–336)

## 2018-10-31 LAB — SARS CORONAVIRUS 2 (TAT 6-24 HRS): SARS Coronavirus 2: NEGATIVE

## 2018-10-31 LAB — RETICULOCYTES
Immature Retic Fract: 14.8 % (ref 2.3–15.9)
RBC.: 4.02 MIL/uL — ABNORMAL LOW (ref 4.22–5.81)
Retic Count, Absolute: 56.3 10*3/uL (ref 19.0–186.0)
Retic Ct Pct: 1.4 % (ref 0.4–3.1)

## 2018-10-31 LAB — TSH: TSH: 3.938 u[IU]/mL (ref 0.350–4.500)

## 2018-10-31 LAB — CK: Total CK: 3250 U/L — ABNORMAL HIGH (ref 49–397)

## 2018-10-31 LAB — LACTIC ACID, PLASMA: Lactic Acid, Venous: 1.6 mmol/L (ref 0.5–1.9)

## 2018-10-31 LAB — TROPONIN I (HIGH SENSITIVITY)
Troponin I (High Sensitivity): 55 ng/L — ABNORMAL HIGH (ref <18)
Troponin I (High Sensitivity): 45 ng/L — ABNORMAL HIGH (ref <18)

## 2018-10-31 LAB — GLUCOSE, CAPILLARY: Glucose-Capillary: 110 mg/dL — ABNORMAL HIGH (ref 70–99)

## 2018-10-31 LAB — FOLATE: Folate: 14 ng/mL (ref >5.9)

## 2018-10-31 MED ORDER — ONDANSETRON HCL 4 MG PO TABS: 4.0000 mg | ORAL_TABLET | Freq: Four times a day (QID) | ORAL | Status: DC | PRN

## 2018-10-31 MED ORDER — LACTULOSE 10 GM/15ML PO SOLN
30.0000 g | Freq: Three times a day (TID) | ORAL | Status: DC
  Administered 2018-10-31 – 2018-11-01 (×4): 30 g via ORAL
  Filled 2018-10-31 (×10): qty 45

## 2018-10-31 MED ORDER — LEVOTHYROXINE SODIUM 50 MCG PO TABS
50.0000 ug | ORAL_TABLET | Freq: Every day | ORAL | Status: DC
  Administered 2018-10-31 – 2018-11-05 (×6): 50 ug via ORAL
  Filled 2018-10-31 (×7): qty 1

## 2018-10-31 MED ORDER — THIAMINE HCL 100 MG/ML IJ SOLN
500.0000 mg | Freq: Three times a day (TID) | INTRAVENOUS | Status: DC
  Administered 2018-10-31 – 2018-11-02 (×6): 500 mg via INTRAVENOUS
  Filled 2018-10-31 (×14): qty 5

## 2018-10-31 MED ORDER — ONDANSETRON HCL 4 MG/2ML IJ SOLN: 4.0000 mg | Freq: Four times a day (QID) | INTRAMUSCULAR | Status: DC | PRN

## 2018-10-31 MED ORDER — AMLODIPINE BESYLATE 10 MG PO TABS
10.0000 mg | ORAL_TABLET | Freq: Every day | ORAL | Status: DC
  Administered 2018-10-31 – 2018-11-05 (×7): 10 mg via ORAL
  Filled 2018-10-31 (×2): qty 1
  Filled 2018-10-31: qty 2
  Filled 2018-10-31 (×3): qty 1
  Filled 2018-10-31: qty 2

## 2018-10-31 MED ORDER — METOPROLOL TARTRATE 25 MG PO TABS: 25.0000 mg | ORAL_TABLET | Freq: Two times a day (BID) | ORAL | Status: DC

## 2018-10-31 MED ORDER — HYDRALAZINE HCL 20 MG/ML IJ SOLN
10.0000 mg | INTRAMUSCULAR | Status: DC | PRN
  Administered 2018-10-31 – 2018-11-03 (×2): 10 mg via INTRAVENOUS
  Filled 2018-10-31 (×2): qty 1

## 2018-10-31 MED ORDER — CLONIDINE HCL 0.2 MG/24HR TD PTWK
0.2000 mg | MEDICATED_PATCH | TRANSDERMAL | Status: DC
  Administered 2018-10-31: 0.2 mg via TRANSDERMAL
  Filled 2018-10-31: qty 1

## 2018-10-31 MED ORDER — PANTOPRAZOLE SODIUM 40 MG PO TBEC
40.0000 mg | DELAYED_RELEASE_TABLET | Freq: Every day | ORAL | Status: DC
  Administered 2018-10-31 – 2018-11-05 (×6): 40 mg via ORAL
  Filled 2018-10-31 (×6): qty 1

## 2018-10-31 MED ORDER — SODIUM CHLORIDE 0.45 % IV SOLN
INTRAVENOUS | Status: AC
  Administered 2018-10-31 – 2018-11-01 (×3): via INTRAVENOUS

## 2018-10-31 NOTE — Procedures (Signed)
Patient Name: Bruce Hayes  MRN: [id number]  Epilepsy Attending: Lora Havens  Referring Physician/Provider: Dr. Gean Birchwood Date: 10/31/2018 Duration: 24.23 minutes  Patient history: 73 year old-year-old male who presented with AMS.  EEG to evaluate for seizures.  Level of alertness: Awake/lethargic  AEDs during EEG study: None  Technical aspects: This EEG study was done with scalp electrodes positioned according to the 10-20 International system of electrode placement. Electrical activity was acquired at a sampling rate of 500Hz  and reviewed with a high frequency filter of 70Hz  and a low frequency filter of 1Hz . EEG data were recorded continuously and digitally stored.   Description: EEG showed continuous generalized 2 to 5 Hz theta-delta slowing, maximal bifrontal.  No clear posterior dominant rhythm was seen. Hyperventilation and photic stimulation were not performed.  Abnormality -Continuous slow, generalized, maximal bifrontal   IMPRESSION: This study is suggestive of moderate diffuse encephalopathy, nonspecific etiology. No seizures or epileptiform discharges were seen throughout the recording.  Rina Adney Barbra Sarks

## 2018-10-31 NOTE — Evaluation (Signed)
Physical Therapy Evaluation Patient Details Name: Bruce Hayes MRN: [id number] DOB: 1946/01/13 Today's Date: 10/31/2018   History of Present Illness  Pt is a 73 y/o male admitted secondary to hepatic encephalopathy, hypertension, and rhabdomyolysis. PMH includes CKD, polysubstance abuse, liver cirrhosis, HTN, and alcohol abuse.   Clinical Impression  Pt presenting with problem above with deficits below. Pt requiring min A for transfer from [facility] to bed this session. Pt with poor safety awareness and slowed processing. Had difficulty putting gown on and was tangled up in lines. Pt currently lives alone and feel he is at increased risk for falls. Reports he has no one to stay with him at d/c. Will continue to follow acutely to maximize functional mobility independence and safety.     Follow Up Recommendations SNF;Supervision/Assistance - 24 hour    Equipment Recommendations  Other (comment)(TBD)    Recommendations for Other Services       Precautions / Restrictions Precautions Precautions: Fall Restrictions Weight Bearing Restrictions: No      Mobility  Bed Mobility Overal bed mobility: Needs Assistance Bed Mobility: Sit to Supine       Sit to supine: Supervision   General bed mobility comments: Supervision for line management for return to supine.   Transfers Overall transfer level: Needs assistance Equipment used: None Transfers: Sit to/from [facility] Sit to Stand: Min assist Stand pivot transfers: Min assist       General transfer comment: Min A for steadying during stand pivot from [facility] back to bed. Pt requiring cues to wait for PT prior to performing transfer and was tangled in his lines upon entry.   Ambulation/Gait                Stairs            Wheelchair Mobility    Modified Rankin (Stroke Patients Only)       Balance Overall balance assessment: Needs assistance Sitting-balance support: No upper extremity  supported;Feet supported Sitting balance-Leahy Scale: Fair     Standing balance support: No upper extremity supported;During functional activity Standing balance-Leahy Scale: Poor Standing balance comment: Reliant on external support                             Pertinent Vitals/Pain Pain Assessment: No/denies pain    Home Living Family/patient expects to be discharged to:: Private residence Living Arrangements: Alone Available Help at Discharge: Other (Comment)(reports no one can check on him) Type of Home: Apartment Home Access: Elevator     Home Layout: One level Home Equipment: Cane - single point;Grab bars - tub/shower      Prior Function Level of Independence: Independent with assistive device(s)         Comments: Uses cane for ambulation      Hand Dominance        Extremity/Trunk Assessment   Upper Extremity Assessment Upper Extremity Assessment: Generalized weakness    Lower Extremity Assessment Lower Extremity Assessment: Generalized weakness    Cervical / Trunk Assessment Cervical / Trunk Assessment: Kyphotic  Communication   Communication: No difficulties  Cognition Arousal/Alertness: Awake/alert Behavior During Therapy: WFL for tasks assessed/performed Overall Cognitive Status: No family/caregiver present to determine baseline cognitive functioning                                 General Comments: Pt A nd O X4, however,  pt demonstrating slowed processing. Pt unable to figure out how to put gown on. Pt with poor safety awareness and attempting to get up without assist. Required cues to wait for PT.       General Comments      Exercises     Assessment/Plan    PT Assessment Patient needs continued PT services  PT Problem List Decreased cognition;Decreased safety awareness;Decreased knowledge of precautions;Decreased balance;Decreased strength;Decreased mobility       PT Treatment Interventions Gait training;DME  instruction;Therapeutic activities;Functional mobility training;Stair training;Therapeutic exercise;Balance training;Patient/family education;Cognitive remediation    PT Goals (Current goals can be found in the Care Plan section)  Acute Rehab PT Goals Patient Stated Goal: none stated PT Goal Formulation: With patient Time For Goal Achievement: 11/14/18 Potential to Achieve Goals: Good    Frequency Min 2X/week   Barriers to discharge Decreased caregiver support      Co-evaluation               AM-PAC PT "6 Clicks" Mobility  Outcome Measure Help needed turning from your back to your side while in a flat bed without using bedrails?: A Little Help needed moving from lying on your back to sitting on the side of a flat bed without using bedrails?: A Little Help needed moving to and from a bed to a chair (including a wheelchair)?: A Little Help needed standing up from a chair using your arms (e.g., wheelchair or bedside chair)?: A Little Help needed to walk in hospital room?: A Little Help needed climbing 3-5 steps with a railing? : A Lot 6 Click Score: 17    End of Session   Activity Tolerance: Patient tolerated treatment well Patient left: in bed;with call bell/phone within reach;with bed alarm set Nurse Communication: Mobility status PT Visit Diagnosis: Unsteadiness on feet (R26.81);Muscle weakness (generalized) (M62.81)    Time: 2641-5830 PT Time Calculation (min) (ACUTE ONLY): 13 min   Charges:   PT Evaluation $PT Eval Moderate Complexity: [place], PT, DPT  Acute Rehabilitation Services  Pager: [phone] Office: [phone]   Rudean Hitt 10/31/2018, 1:53 PM

## 2018-10-31 NOTE — Progress Notes (Addendum)
PROGRESS NOTE    Bruce Hayes   VVZ:[id number]  DOB: May 02, 1945  DOA: 10/30/2018 PCP: Lorene Dy, MD   Brief Narrative:  Bruce Hayes is a 73 y.o. 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  Per patient's sister patient sister had talked to the patient 2 days ago over the phone and looked fine.  Yesterday patient's neighbor saw that patient's book he was left on the door and went to check on to the patient.  Patient was found on the floor confused.  Patient on coming to the ER had talked to the ER physician that he was watching some game and later passed out.   In the ER patient appeared confused with periods of confusion waxing and waning.  CT head is unremarkable MRI brain shows features concerning for hepatic encephalopathy. CK levels were around 3000.  Labs show creatinine around 2.6 AST 128 ALT 56 ammonia level was 82.  Subjective: Patient states that he barely remembers being on the floor and coming to the hospital via EMS.  He states he has been taking his medications appropriately and was having about 2 bowel movements a day.  He states that he drinks alcohol occasionally and did not drink any prior to this episode.  He initially denied using cocaine but when I discussed the fact that  UDS has been positive for cocaine many times dating back to 2012, he admitted that he did use it about 2 weeks ago and will try to stop.    Assessment & Plan:   Principal Problem:   Acute metabolic encephalopathy -MRI brain:New or increased intrinsic T1 signal in the globus pallidus since 2018. This can be seen as a complication of hepatic insufficiency/cirrhosis, recommend correlation with serum ammonia levels. -Suspected to be hepatic encephalopathy- he has improved-continue lactulose and IV  thiamine   Active Problems:   AKI on CKD 3 and rhabdomyolysis - Last creatinine was 1.74 in 2018-when admitted, it was 2.61 and has improved only slightly to 2.52 - Rhabdomyolysis is secondary to laying on the floor -We will continue slow IV fluids -Repeat CK tomorrow morning- -Holding lisinopril and chlorthalidone  Metabolic acidosis - Question due to chronic kidney disease as this is not new acidosis-follow  Alcoholic cirrhosis with portal hypertension and thrombocytopenia Elevated LFTs -There is also mention of hep C on his chart although I see no hep C testing in epic - No signs of ascites on exam -Cannot give nadolol due to cocaine abuse -Check INR in a.m. and calculate child Pugh score  Ongoing alcohol abuse -He states he drinks occasionally and states that he will try to stop    OBESITY Body mass index is 32.33 kg/m. -Needs to lose weight    TOBACCO ABUSE -He smokes occasionally  Hypertensive urgency -Continue amlodipine-continue hold lisinopril and chlorthalidone - Cannot start a beta-blocker due to cocaine abuse-we will start a clonidine patch  Hypothyroidism -Continue Synthroid    Cocaine abuse  -Has been counseled and states that he will try to stop  Anemia-hemoglobin is 11-12 range -Likely secondary to chronic disease however we will check an anemia panel as I do not see one done previously in our computer system  Time spent in minutes: 35 minutes DVT prophylaxis: SCDs Code Status: Full code Family Communication:  Disposition Plan: PT is recommending SNF- I  have placed a social work Administrator, arts he lives alone, he is not safe to be discharged home today as his gait is unstable Consultants:   None Procedures:   None Antimicrobials:  Anti-infectives (From admission, onward)   None       Objective: Vitals:   10/31/18 0900 10/31/18 1146 10/31/18 1358 10/31/18 1530  BP: (!) 202/65 (!) 195/82 (!) 191/67 (!) 197/76  Pulse: 77 87 81 86  Resp: 17  11 18 20   Temp:    97.6 F (36.4 C)  TempSrc:    Oral  SpO2: 99% 99% 98% 98%  Weight:      Height:        Intake/Output Summary (Last 24 hours) at 10/31/2018 1653 Last data filed at 10/31/2018 1400 Gross per 24 hour  Intake 50 ml  Output --  Net 50 ml   Filed Weights   10/30/18 2210  Weight: 99.3 kg    Examination: General exam: Appears comfortable  HEENT: PERRLA, oral mucosa moist, no sclera icterus or thrush Respiratory system: Clear to auscultation. Respiratory effort normal. Cardiovascular system: S1 & S2 heard, RRR.   Gastrointestinal system: Abdomen soft, non-tender, nondistended. Normal bowel sounds. Central nervous system: Alert and oriented. No focal neurological deficits. Extremities: No cyanosis, clubbing or edema Skin: No rashes or ulcers Psychiatry:  Mood & affect appropriate.    Data Reviewed: I have personally reviewed following labs and imaging studies  CBC: Recent Labs  Lab 10/30/18 2230  WBC 6.3  NEUTROABS 3.4  HGB 11.5*  HCT 32.4*  MCV 83.9  PLT 99*   Basic Metabolic Panel: Recent Labs  Lab 10/30/18 2230 10/31/18 0822  NA 146* 145  K 4.2 4.1  CL 120* 119*  CO2 16* 16*  GLUCOSE 90 92  BUN 29* 31*  CREATININE 2.61* 2.52*  CALCIUM 8.3* 8.4*   GFR: Estimated Creatinine Clearance: 31.2 mL/min (A) (by C-G formula based on SCr of 2.52 mg/dL (H)). Liver Function Tests: Recent Labs  Lab 10/30/18 2230  AST 128*  ALT 56*  ALKPHOS 154*  BILITOT 1.4*  PROT 7.6  ALBUMIN 2.3*   No results for input(s): LIPASE, AMYLASE in the last 168 hours. Recent Labs  Lab 10/30/18 2230  AMMONIA 82*   Coagulation Profile: No results for input(s): INR, PROTIME in the last 168 hours. Cardiac Enzymes: Recent Labs  Lab 10/31/18 0323  CKTOTAL 3,250*   BNP (last 3 results) No results for input(s): PROBNP in the last 8760 hours. HbA1C: No results for input(s): HGBA1C in the last 72 hours. CBG: Recent Labs  Lab 10/31/18 0316 10/31/18 0759  10/31/18 1225 10/31/18 1608  GLUCAP 62* 84 114* 110*   Lipid Profile: No results for input(s): CHOL, HDL, LDLCALC, TRIG, CHOLHDL, LDLDIRECT in the last 72 hours. Thyroid Function Tests: Recent Labs    10/31/18 0822  TSH 3.938   Anemia Panel: No results for input(s): VITAMINB12, FOLATE, FERRITIN, TIBC, IRON, RETICCTPCT in the last 72 hours. Urine analysis:    Component Value Date/Time   COLORURINE YELLOW 10/31/2018 0211   APPEARANCEUR CLEAR 10/31/2018 0211   LABSPEC 1.015 10/31/2018 0211   PHURINE 5.0 10/31/2018 0211   GLUCOSEU NEGATIVE 10/31/2018 0211   HGBUR MODERATE (A) 10/31/2018 0211   BILIRUBINUR NEGATIVE 10/31/2018 0211   KETONESUR NEGATIVE 10/31/2018 0211   PROTEINUR 100 (A) 10/31/2018 0211   UROBILINOGEN 4.0 (H) 03/31/2014 0920   NITRITE NEGATIVE 10/31/2018 0211   LEUKOCYTESUR NEGATIVE 10/31/2018 0211   Sepsis Labs: @LABRCNTIP (procalcitonin:4,lacticidven:4) ) Recent Results (from the  past 240 hour(s))  SARS CORONAVIRUS 2 (TAT 6-24 HRS) Nasopharyngeal Nasopharyngeal Swab     Status: None   Collection Time: 10/31/18  1:25 AM   Specimen: Nasopharyngeal Swab  Result Value Ref Range Status   SARS Coronavirus 2 NEGATIVE NEGATIVE Final    Comment: (NOTE) SARS-CoV-2 target nucleic acids are NOT DETECTED. The SARS-CoV-2 RNA is generally detectable in upper and lower respiratory specimens during the acute phase of infection. Negative results do not preclude SARS-CoV-2 infection, do not rule out co-infections with other pathogens, and should not be used as the sole basis for treatment or other patient management decisions. Negative results must be combined with clinical observations, patient history, and epidemiological information. The expected result is Negative. Fact Sheet for Patients: SugarRoll.be Fact Sheet for Healthcare Providers: https://www.woods-mathews.com/ This test is not yet approved or cleared by the [place] FDA and  has been authorized for detection and/or diagnosis of SARS-CoV-2 by FDA under an Emergency Use Authorization (EUA). This EUA will remain  in effect (meaning this test can be used) for the duration of the COVID-19 declaration under Section 56 4(b)(1) of the Act, 21 U.S.C. section 360bbb-3(b)(1), unless the authorization is terminated or revoked sooner. Performed at [facility], [place] [address], [place], [place] [address]          Radiology Studies: Dg Chest 2 View  Result Date: 10/30/2018 CLINICAL DATA:  Altered mental status EXAM: CHEST - 2 VIEW COMPARISON:  Radiograph 04/05/2016 FINDINGS: No consolidation, features of edema, pneumothorax, or effusion. Pulmonary vascularity is normally distributed. The aorta is calcified. The remaining cardiomediastinal contours are unremarkable. No acute osseous or soft tissue abnormality. Degenerative changes are present in the imaged spine and shoulders. IMPRESSION: No acute cardiopulmonary abnormality. Aortic Atherosclerosis (ICD10-I70.0). Electronically Signed   By: Lovena Le M.D.   On: 10/30/2018 23:04   Dg Pelvis 1-2 Views  Result Date: 10/31/2018 CLINICAL DATA:  Pain EXAM: PELVIS - 1-2 VIEW COMPARISON:  None. FINDINGS: There is no evidence of pelvic fracture or dislocation. There is mild symmetric narrowing of each hip joint. No erosive change. There are rounded metallic foreign bodies in the medial right upper thigh region. IMPRESSION: Mild symmetric narrowing of each hip joint. No fracture or dislocation. Electronically Signed   By: Lowella Grip III M.D.   On: 10/31/2018 07:48   Ct Head Wo Contrast  Result Date: 10/30/2018 CLINICAL DATA:  Altered mental status (AMS), unclear cause. EXAM: CT HEAD WITHOUT CONTRAST TECHNIQUE: Contiguous axial images were obtained from the base of the skull through the vertex without intravenous contrast. COMPARISON:  Brain MRI 08/28/2016, head CT 08/27/2016 FINDINGS: Brain: The  examination is motion degraded. No evidence of acute intracranial hemorrhage. No demarcated cortical infarction is identified. No evidence of intracranial mass. No midline shift or extra-axial fluid collection. Minimal scattered hypoattenuation within the cerebral white matter is nonspecific, but consistent with chronic small vessel ischemic disease. Cerebral volume is normal for age. Partially empty sella turcica. Vascular: No hyperdense vessel. Atherosclerotic calcification of the carotid artery siphons. Skull: No calvarial fracture. Sinuses/Orbits: Visualized orbits demonstrate no acute abnormality. Chronic fracture deformity of the right orbital floor. No significant paranasal sinus disease or mastoid effusion Other: Metallic foreign body within the right maxillofacial soft tissues. This was present on prior head CT examination. IMPRESSION: 1. Motion degraded examination with no CT evidence of acute intracranial abnormality. 2. Mild chronic small vessel ischemic disease. Electronically Signed   By: Kellie Simmering   On: 10/30/2018  23:03   Mr Brain 85 Contrast  Result Date: 10/31/2018 CLINICAL DATA:  73 year old male with unexplained altered mental status. History of cirrhosis. EXAM: MRI HEAD WITHOUT CONTRAST TECHNIQUE: Multiplanar, multiecho pulse sequences of the brain and surrounding structures were obtained without intravenous contrast. COMPARISON:  Head CT 10/30/2018. Brain MRI 08/28/2016. FINDINGS: Brain: No restricted diffusion to suggest acute infarction. No midline shift, mass effect, evidence of mass lesion, ventriculomegaly, extra-axial collection or acute intracranial hemorrhage. Cervicomedullary junction and pituitary are within normal limits. Patchy and indistinct bilateral cerebral white matter T2 and FLAIR hyperintensity appears stable since 2018, mild to moderate for age. Similar stable patchy T2 hyperintensity in the pons. No cortical encephalomalacia or chronic cerebral blood products  identified. There does appear to be intrinsic increased T1 signal in the globus pallidus which is new or increased. The other deep gray nuclei and cerebellum remain normal. Vascular: Major intracranial vascular flow voids are stable. Skull and upper cervical spine: Negative visible cervical spine. Visualized bone marrow signal is within normal limits. Sinuses/Orbits: Negative. Other: Mastoids remain clear. Visible internal auditory structures appear normal. Scalp and face soft tissues appear negative. IMPRESSION: 1. New or increased intrinsic T1 signal in the globus pallidus since 2018. This can be seen as a complication of hepatic insufficiency/cirrhosis, recommend correlation with serum ammonia levels. 2. No other acute intracranial abnormality. Stable mild to moderate for age nonspecific cerebral white matter signal changes, most commonly due to chronic small vessel disease. Electronically Signed   By: Genevie Ann M.D.   On: 10/31/2018 01:59      Scheduled Meds:  amLODipine  10 mg Oral Daily   lactulose  30 g Oral TID   levothyroxine  50 mcg Oral Q0600   pantoprazole  40 mg Oral Daily   Continuous Infusions:  sodium chloride 100 mL/hr at 10/31/18 0901   thiamine injection Stopped (10/31/18 1219)     LOS: 0 days      Debbe Odea, MD [facility] Pager: [email or web address] Password [email or web address] 10/31/2018, 4:53 PM
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
## 2018-10-31 NOTE — ED Notes (Signed)
Lunch tray ordered 
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
## 2018-10-31 NOTE — ED Notes (Signed)
Patient transported to MRI 

## 2018-10-31 NOTE — ED Notes (Signed)
Tele   Breakfast ordered  

## 2018-10-31 NOTE — ED Notes (Signed)
Pt CBG was 114, notified Katie(RN)

## 2018-11-01 LAB — BASIC METABOLIC PANEL
Anion gap: 8 (ref 5–15)
BUN: 27 mg/dL — ABNORMAL HIGH (ref 8–23)
CO2: 16 mmol/L — ABNORMAL LOW (ref 22–32)
Calcium: 8 mg/dL — ABNORMAL LOW (ref 8.9–10.3)
Chloride: 120 mmol/L — ABNORMAL HIGH (ref 98–111)
Creatinine, Ser: 2.44 mg/dL — ABNORMAL HIGH (ref 0.61–1.24)
GFR calc Af Amer: 30 mL/min — ABNORMAL LOW (ref >60)
GFR calc non Af Amer: 26 mL/min — ABNORMAL LOW (ref >60)
Glucose, Bld: 98 mg/dL (ref 70–99)
Potassium: 3.9 mmol/L (ref 3.5–5.1)
Sodium: 144 mmol/L (ref 135–145)

## 2018-11-01 LAB — GLUCOSE, CAPILLARY
Glucose-Capillary: 101 mg/dL — ABNORMAL HIGH (ref 70–99)
Glucose-Capillary: 104 mg/dL — ABNORMAL HIGH (ref 70–99)
Glucose-Capillary: 78 mg/dL (ref 70–99)
Glucose-Capillary: 90 mg/dL (ref 70–99)

## 2018-11-01 LAB — CK: Total CK: 2270 U/L — ABNORMAL HIGH (ref 49–397)

## 2018-11-01 LAB — PROTIME-INR
INR: 1.3 — ABNORMAL HIGH (ref 0.8–1.2)
Prothrombin Time: 16.1 seconds — ABNORMAL HIGH (ref 11.4–15.2)

## 2018-11-01 LAB — VITAMIN B12: Vitamin B-12: 887 pg/mL (ref 180–914)

## 2018-11-01 MED ORDER — CARVEDILOL 3.125 MG PO TABS
3.1250 mg | ORAL_TABLET | Freq: Two times a day (BID) | ORAL | Status: DC
  Administered 2018-11-01 – 2018-11-05 (×8): 3.125 mg via ORAL
  Filled 2018-11-01 (×8): qty 1

## 2018-11-01 MED ORDER — CLONIDINE HCL 0.3 MG/24HR TD PTWK
0.3000 mg | MEDICATED_PATCH | TRANSDERMAL | Status: DC
  Administered 2018-11-01: 0.3 mg via TRANSDERMAL
  Filled 2018-11-01: qty 1

## 2018-11-01 NOTE — Progress Notes (Signed)
PROGRESS NOTE    [name]   ZOX:[id number]  DOB: [birth year]  DOA: 10/30/2018 PCP: Lorene Dy, MD   Brief Narrative:  Bruce Hayes is a 73 y.o. 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  Per patient's sister patient sister had talked to the patient 2 days ago over the phone and looked fine.  Yesterday patient's neighbor saw that patient's book he was left on the door and went to check on to the patient.  Patient was found on the floor confused.  Patient on coming to the ER had talked to the ER physician that he was watching some game and later passed out.   In the ER patient appeared confused with periods of confusion waxing and waning.  CT head is unremarkable MRI brain shows features concerning for hepatic encephalopathy. CK levels were around 3000.  Labs show creatinine around 2.6 AST 128 ALT 56 ammonia level was 82.  11/01/2018: Patient seen.  No new changes.  Patient is awaiting disposition.  Subjective: No new complaints. No fever or chills. No chest pain. No shortness of breath.    Assessment & Plan: Acute metabolic encephalopathy: -MRI brain:New or increased intrinsic T1 signal in the globus pallidus since 2018. This can be seen as a complication of hepatic insufficiency/cirrhosis, recommend correlation with serum ammonia levels. -Suspected to be hepatic encephalopathy- he has improved-continue lactulose and IV thiamine 11/01/2018: Acute encephalopathy has resolved.  Pursue disposition.   AKI on CKD 3 and rhabdomyolysis: - Last creatinine was 1.74 in 2018-when admitted, it was 2.61 and has improved only slightly to 2.52 - Rhabdomyolysis is secondary to laying on the floor -We will continue slow IV fluids -Repeat CK tomorrow morning- -Holding lisinopril and  chlorthalidone 11/01/2018: Serum creatinine is 2.44 today.  CPK is 2270.  Continue to monitor renal function.  Metabolic acidosis - Likely related to chronic liver disease (likely compensatory).  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  Hypertensive urgency -Increase clonidine patch from 0.2 mg to 0.3 mg patch. -Add Coreg 3.125 mg p.o. twice daily -Monitor heart rate closely -Continue Norvasc Further management will depend on hospital course  Hypothyroidism -Continue Synthroid    Cocaine abuse  -Counseled.    Anemia: -Likely anemia of chronic inflammation.    Time spent in minutes: 35 minutes DVT prophylaxis: SCDs Code Status: Full code Family Communication:  Disposition Plan: Awaiting SNF Consultants:   None Procedures:   None Antimicrobials:  Anti-infectives (From admission, onward)   None       Objective: Vitals:   10/31/18 1530 10/31/18 1754 10/31/18 2038 11/01/18 0435  BP: (!) 197/76 (!) 161/91 (!) 170/68 (!) 177/65  Pulse: 86 92 88 82  Resp: 20 18  19   Temp: 97.6 F (36.4 C)  98.8 F (37.1 C) 98.4 F (36.9 C)  TempSrc: Oral  Oral Oral  SpO2: 98% 100% 100% 100%  Weight:      Height:        Intake/Output Summary (Last 24 hours) at 11/01/2018 1040 Last data filed at 11/01/2018 0951 Gross per 24 hour  Intake 3350.78 ml  Output 0 ml  Net 3350.78 ml   Filed Weights   10/30/18 2210  Weight: 99.3 kg    Examination: General exam: Appears comfortable  HEENT: PERRLA, oral mucosa  moist, no sclera icterus or thrush Respiratory system: Clear to auscultation. Respiratory effort normal. Cardiovascular system: S1 & S2 heard, RRR.   Gastrointestinal system: Abdomen soft, non-tender, nondistended. Normal bowel sounds. Central nervous system: Alert and oriented. No focal neurological deficits. Extremities: No cyanosis, clubbing or edema Skin: No rashes or  ulcers Psychiatry:  Mood & affect appropriate.    Data Reviewed: I have personally reviewed following labs and imaging studies  CBC: Recent Labs  Lab 10/30/18 2230  WBC 6.3  NEUTROABS 3.4  HGB 11.5*  HCT 32.4*  MCV 83.9  PLT 99*   Basic Metabolic Panel: Recent Labs  Lab 10/30/18 2230 10/31/18 0822 11/01/18 0405  NA 146* 145 144  K 4.2 4.1 3.9  CL 120* 119* 120*  CO2 16* 16* 16*  GLUCOSE 90 92 98  BUN 29* 31* 27*  CREATININE 2.61* 2.52* 2.44*  CALCIUM 8.3* 8.4* 8.0*   GFR: Estimated Creatinine Clearance: 32.2 mL/min (A) (by C-G formula based on SCr of 2.44 mg/dL (H)). Liver Function Tests: Recent Labs  Lab 10/30/18 2230  AST 128*  ALT 56*  ALKPHOS 154*  BILITOT 1.4*  PROT 7.6  ALBUMIN 2.3*   No results for input(s): LIPASE, AMYLASE in the last 168 hours. Recent Labs  Lab 10/30/18 2230  AMMONIA 82*   Coagulation Profile: Recent Labs  Lab 11/01/18 0405  INR 1.3*   Cardiac Enzymes: Recent Labs  Lab 10/31/18 0323 11/01/18 0405  CKTOTAL 3,250* 2,270*   BNP (last 3 results) No results for input(s): PROBNP in the last 8760 hours. HbA1C: No results for input(s): HGBA1C in the last 72 hours. CBG: Recent Labs  Lab 10/31/18 0759 10/31/18 1225 10/31/18 1608 11/01/18 0008 11/01/18 0715  GLUCAP 84 114* 110* 78 90   Lipid Profile: No results for input(s): CHOL, HDL, LDLCALC, TRIG, CHOLHDL, LDLDIRECT in the last 72 hours. Thyroid Function Tests: Recent Labs    10/31/18 0822  TSH 3.938   Anemia Panel: Recent Labs    10/31/18 1715 11/01/18 0405  VITAMINB12  --  887  FOLATE 14.0  --   FERRITIN 132  --   TIBC 286  --   IRON 77  --   RETICCTPCT 1.4  --    Urine analysis:    Component Value Date/Time   COLORURINE YELLOW 10/31/2018 0211   APPEARANCEUR CLEAR 10/31/2018 0211   LABSPEC 1.015 10/31/2018 0211   PHURINE 5.0 10/31/2018 0211   GLUCOSEU NEGATIVE 10/31/2018 0211   HGBUR MODERATE (A) 10/31/2018 0211   BILIRUBINUR NEGATIVE  10/31/2018 0211   KETONESUR NEGATIVE 10/31/2018 0211   PROTEINUR 100 (A) 10/31/2018 0211   UROBILINOGEN 4.0 (H) 03/31/2014 0920   NITRITE NEGATIVE 10/31/2018 0211   LEUKOCYTESUR NEGATIVE 10/31/2018 0211   Sepsis Labs: @LABRCNTIP (procalcitonin:4,lacticidven:4) ) Recent Results (from the past 240 hour(s))  SARS CORONAVIRUS 2 (TAT 6-24 HRS) Nasopharyngeal Nasopharyngeal Swab     Status: None   Collection Time: 10/31/18  1:25 AM   Specimen: Nasopharyngeal Swab  Result Value Ref Range Status   SARS Coronavirus 2 NEGATIVE NEGATIVE Final    Comment: (NOTE) SARS-CoV-2 target nucleic acids are NOT DETECTED. The SARS-CoV-2 RNA is generally detectable in upper and lower respiratory specimens during the acute phase of infection. Negative results do not preclude SARS-CoV-2 infection, do not rule out co-infections with other pathogens, and should not be used as the sole basis for treatment or other patient management decisions. Negative results must be combined with clinical observations, patient history, and epidemiological information. The expected  result is Negative. Fact Sheet for Patients: SugarRoll.be Fact Sheet for Healthcare Providers: https://www.woods-mathews.com/ This test is not yet approved or cleared by the [place] FDA and  has been authorized for detection and/or diagnosis of SARS-CoV-2 by FDA under an Emergency Use Authorization (EUA). This EUA will remain  in effect (meaning this test can be used) for the duration of the COVID-19 declaration under Section 56 4(b)(1) of the Act, 21 U.S.C. section 360bbb-3(b)(1), unless the authorization is terminated or revoked sooner. Performed at [facility], [place] [address], [place], [place] [address]   Urine culture     Status: None   Collection Time: 10/31/18  2:11 AM   Specimen: Urine, Random  Result Value Ref Range Status   Specimen Description URINE, RANDOM  Final   Special  Requests NONE  Final   Culture   Final    NO GROWTH Performed at [facility], [place] [address] [place], [place] [address]    Report Status 10/31/2018 FINAL  Final         Radiology Studies: Dg Chest 2 View  Result Date: 10/30/2018 CLINICAL DATA:  Altered mental status EXAM: CHEST - 2 VIEW COMPARISON:  Radiograph 04/05/2016 FINDINGS: No consolidation, features of edema, pneumothorax, or effusion. Pulmonary vascularity is normally distributed. The aorta is calcified. The remaining cardiomediastinal contours are unremarkable. No acute osseous or soft tissue abnormality. Degenerative changes are present in the imaged spine and shoulders. IMPRESSION: No acute cardiopulmonary abnormality. Aortic Atherosclerosis (ICD10-I70.0). Electronically Signed   By: Lovena Le M.D.   On: 10/30/2018 23:04   Dg Pelvis 1-2 Views  Result Date: 10/31/2018 CLINICAL DATA:  Pain EXAM: PELVIS - 1-2 VIEW COMPARISON:  None. FINDINGS: There is no evidence of pelvic fracture or dislocation. There is mild symmetric narrowing of each hip joint. No erosive change. There are rounded metallic foreign bodies in the medial right upper thigh region. IMPRESSION: Mild symmetric narrowing of each hip joint. No fracture or dislocation. Electronically Signed   By: Lowella Grip III M.D.   On: 10/31/2018 07:48   Ct Head Wo Contrast  Result Date: 10/30/2018 CLINICAL DATA:  Altered mental status (AMS), unclear cause. EXAM: CT HEAD WITHOUT CONTRAST TECHNIQUE: Contiguous axial images were obtained from the base of the skull through the vertex without intravenous contrast. COMPARISON:  Brain MRI 08/28/2016, head CT 08/27/2016 FINDINGS: Brain: The examination is motion degraded. No evidence of acute intracranial hemorrhage. No demarcated cortical infarction is identified. No evidence of intracranial mass. No midline shift or extra-axial fluid collection. Minimal scattered hypoattenuation within the cerebral white matter is  nonspecific, but consistent with chronic small vessel ischemic disease. Cerebral volume is normal for age. Partially empty sella turcica. Vascular: No hyperdense vessel. Atherosclerotic calcification of the carotid artery siphons. Skull: No calvarial fracture. Sinuses/Orbits: Visualized orbits demonstrate no acute abnormality. Chronic fracture deformity of the right orbital floor. No significant paranasal sinus disease or mastoid effusion Other: Metallic foreign body within the right maxillofacial soft tissues. This was present on prior head CT examination. IMPRESSION: 1. Motion degraded examination with no CT evidence of acute intracranial abnormality. 2. Mild chronic small vessel ischemic disease. Electronically Signed   By: Kellie Simmering   On: 10/30/2018 23:03   Mr Brain Wo Contrast  Result Date: 10/31/2018 CLINICAL DATA:  73 year old male with unexplained altered mental status. History of cirrhosis. EXAM: MRI HEAD WITHOUT CONTRAST TECHNIQUE: Multiplanar, multiecho pulse sequences of the brain and surrounding structures were obtained without intravenous contrast. COMPARISON:  Head CT 10/30/2018. Brain MRI 08/28/2016. FINDINGS: Brain: No restricted diffusion to suggest acute infarction. No midline shift, mass effect, evidence of mass lesion, ventriculomegaly, extra-axial collection or acute intracranial hemorrhage. Cervicomedullary junction and pituitary are within normal limits. Patchy and indistinct bilateral cerebral white matter T2 and FLAIR hyperintensity appears stable since 2018, mild to moderate for age. Similar stable patchy T2 hyperintensity in the pons. No cortical encephalomalacia or chronic cerebral blood products identified. There does appear to be intrinsic increased T1 signal in the globus pallidus which is new or increased. The other deep gray nuclei and cerebellum remain normal. Vascular: Major intracranial vascular flow voids are stable. Skull and upper cervical spine: Negative visible  cervical spine. Visualized bone marrow signal is within normal limits. Sinuses/Orbits: Negative. Other: Mastoids remain clear. Visible internal auditory structures appear normal. Scalp and face soft tissues appear negative. IMPRESSION: 1. New or increased intrinsic T1 signal in the globus pallidus since 2018. This can be seen as a complication of hepatic insufficiency/cirrhosis, recommend correlation with serum ammonia levels. 2. No other acute intracranial abnormality. Stable mild to moderate for age nonspecific cerebral white matter signal changes, most commonly due to chronic small vessel disease. Electronically Signed   By: Genevie Ann M.D.   On: 10/31/2018 01:59      Scheduled Meds:  amLODipine  10 mg Oral Daily   cloNIDine  0.2 mg Transdermal Weekly   lactulose  30 g Oral TID   levothyroxine  50 mcg Oral Q0600   pantoprazole  40 mg Oral Daily   Continuous Infusions:  thiamine injection 500 mg (11/01/18 0951)     LOS: 1 day      Bonnell Public, MD [facility] Pager: [email or web address] Password [email or web address] 11/01/2018, 10:40 AM

## 2018-11-01 NOTE — Progress Notes (Signed)
Patient refused to take Lactulose Solution . RN tried to explain but he said "I know"., not even one of those.

## 2018-11-01 NOTE — Plan of Care (Signed)
  Problem: Education: Goal: Knowledge of General Education information will improve Description: Including pain rating scale, medication(s)/side effects and non-pharmacologic comfort measures Outcome: Progressing   Problem: Clinical Measurements: Goal: Diagnostic test results will improve Outcome: Progressing   Problem: Elimination: Goal: Will not experience complications related to bowel motility Outcome: Progressing   

## 2018-11-01 NOTE — Plan of Care (Signed)
  Problem: Elimination: Goal: Will not experience complications related to bowel motility Outcome: Progressing   

## 2018-11-02 LAB — GLUCOSE, CAPILLARY
Glucose-Capillary: 101 mg/dL — ABNORMAL HIGH (ref 70–99)
Glucose-Capillary: 96 mg/dL (ref 70–99)
Glucose-Capillary: 96 mg/dL (ref 70–99)

## 2018-11-02 NOTE — Plan of Care (Signed)
  Problem: Health Behavior/Discharge Planning: Goal: Ability to manage health-related needs will improve Outcome: Progressing   

## 2018-11-02 NOTE — TOC Initial Note (Signed)
Transition of Care [facility]) - Initial/Assessment Note    Patient Details  Name: Bruce Hayes MRN: [id number] Date of Birth: 1945/12/10  [facility] CM/SW Contact:    Alexander Mt, [place] Phone Number: 11/02/2018, 11:02 AM  Clinical Narrative:                 CSW acknowledging consult for SNF. CSW completed telephonic assessment to send referral to SNF. CSW introduced self, role, reason for call. Pt from home with self. He has a sister who is supportive. Pt utilized a cane at home prior to admission. Pt states he is working with his sister to hire someone to clean his apartment, he is fixated on staying at rehab only until his apartment is clean. We discussed that it would be dependent on how he progresses with therapy and that we would bring him the CMS list of facilities when available.   Pt agreeable to referral out; his insurance company is closed for the weekend and will need insurance auth prior to dc.   Expected Discharge Plan: Skilled Nursing Facility Barriers to Discharge: Continued Medical Work up   Patient Goals and CMS Choice Patient states their goals for this hospitalization and ongoing recovery are:: to go somewhere until my apartment is back ready for me to come home CMS Medicare.gov Compare Post Acute Care list provided to:: Patient Choice offered to / list presented to : Patient  Expected Discharge Plan and Services Expected Discharge Plan: [place] In-house Referral: Clinical Social Work Discharge Planning Services: CM Consult Post Acute Care Choice: [place] Living arrangements for the past 2 months: Apartment  Prior Living Arrangements/Services Living arrangements for the past 2 months: Apartment Lives with:: Self Patient language and need for interpreter reviewed:: Yes(no needs) Do you feel safe going back to the place where you live?: Yes      Need for Family Participation in Patient Care: Yes  (Comment)(supervision/assistance as needed) Care giver support system in place?: Yes (comment)(siser) Current home services: DME(cane) Criminal Activity/Legal Involvement Pertinent to Current Situation/Hospitalization: No - Comment as needed  Activities of Daily Living Home Assistive Devices/Equipment: Cane (specify quad or straight) ADL Screening (condition at time of admission) Patient's cognitive ability adequate to safely complete daily activities?: Yes Is the patient deaf or have difficulty hearing?: No Does the patient have difficulty seeing, even when wearing glasses/contacts?: No Does the patient have difficulty concentrating, remembering, or making decisions?: No Patient able to express need for assistance with ADLs?: Yes Does the patient have difficulty dressing or bathing?: No Independently performs ADLs?: Yes (appropriate for developmental age) Does the patient have difficulty walking or climbing stairs?: No Weakness of Legs: None Weakness of Arms/Hands: None  Permission Sought/Granted Permission sought to share information with : Family Supports Permission granted to share information with : Yes, Verbal Permission Granted  Share Information with NAME: [name]  Permission granted to share info w AGENCY: SNFs  Permission granted to share info w Relationship: sister  Permission granted to share info w Contact Information: [phone]  Emotional Assessment Appearance:: Other (Comment Required(telephonic assessment) Attitude/Demeanor/Rapport: Engaged(telephonic assessment) Affect (typically observed): Accepting(telephonic assessment) Orientation: : Oriented to Self, Oriented to Place, Oriented to  Time, Oriented to Situation Alcohol / Substance Use: Alcohol Use Psych Involvement: No (comment)  Admission diagnosis:  Hepatic encephalopathy (HCC) [K72.90] Confusion [R41.0] Fall [W19.XXXA] Altered mental status, unspecified altered mental status type [W26.37] Acute  metabolic encephalopathy [C58.85] Patient Active Problem List   Diagnosis Date  Noted  . Acute metabolic encephalopathy 55/01/5866  . Hepatic encephalopathy ([place]) 10/31/2018  . Encounter for colonoscopy due to history of adenomatous colonic polyps   . Benign neoplasm of ascending colon   . Other cirrhosis of liver ([place])   . Gastritis and gastroduodenitis   . Portal hypertensive gastropathy ([place])   . Hypertensive urgency 08/27/2016  . Acute kidney injury superimposed on CKD ([place]) 08/27/2016  . Headache 08/27/2016  . Blurred vision 08/27/2016  . AKI (acute kidney injury) ([place]) 08/27/2016  . Severe alcohol use disorder ([place]) 11/11/2015  . Alcohol use disorder, severe, dependence ([place]) 11/08/2015  . Alcohol abuse 12/07/2013  . Cocaine abuse ([place]) 12/07/2013  . Polysubstance abuse ([place]) 12/07/2013  . Alcohol use disorder, moderate, dependence ([place]) 12/07/2013  . Depression   . 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 CKD (chronic kidney disease), stage III 09/06/2007  . HEPATITIS B CARRIER 07/16/2007  . UNSPECIFIED DISORDER OF LIVER 07/03/2007  . HEPATITIS C 05/22/2007  . HYPERLIPIDEMIA 05/22/2007  . OBESITY 05/22/2007  . THROMBOCYTOPENIA 05/22/2007  . TOBACCO ABUSE 05/22/2007  . Essential hypertension 05/22/2007  . COCAINE ABUSE, HX OF 05/22/2007   PCP:  Lorene Dy, MD Pharmacy:   [facility] [place], [place] [address] Phone: [phone] Fax: [phone]     Social Determinants of Health (SDOH) Interventions    Readmission Risk Interventions No flowsheet data found.

## 2018-11-02 NOTE — Progress Notes (Signed)
Patient refused bed alarm.  Farley Ly RN

## 2018-11-02 NOTE — Evaluation (Signed)
Occupational Therapy Evaluation Patient Details Name: Bruce Hayes MRN: [id number] DOB: 02-02-1945 Today's Date: 11/02/2018    History of Present Illness Pt is a 73 y/o male admitted secondary to hepatic encephalopathy, hypertension, and rhabdomyolysis. PMH includes CKD, polysubstance abuse, liver cirrhosis, HTN, and alcohol abuse.    Clinical Impression   Pt with decline in function and safety with ADLs and ADL mobility with impaired strength, balance and endurance. Pt reports that PTA, he lived alone in an apartment with an elevator and that he was independent with ADLs/selfcare, home mgt, meals and used a cane for mobility. Pt currently requires min guard A for UB ADLs, sup with bed mobility to sit EOB, mod A with LB ADLs and min - min guard A with mobility using a RW. Pt would benefit from acute OT services to address impairments to maximize level of function and safety    Follow Up Recommendations  SNF    Equipment Recommendations  None recommended by OT;Other (comment)(TBD at next venue of care)    Recommendations for Other Services       Precautions / Restrictions Precautions Precautions: Fall Restrictions Weight Bearing Restrictions: No      Mobility Bed Mobility Overal bed mobility: Needs Assistance Bed Mobility: Sit to Supine       Sit to supine: Supervision      Transfers Overall transfer level: Needs assistance Equipment used: Rolling walker (2 wheeled) Transfers: Sit to/from [facility] Sit to Stand: Min assist Stand pivot transfers: Min guard       General transfer comment: min A for initial sit - stand from bed and to steady with RW, min guard A from 3 in 1    Balance Overall balance assessment: Needs assistance Sitting-balance support: No upper extremity supported;Feet supported Sitting balance-Leahy Scale: Fair     Standing balance support: No upper extremity supported;During functional activity Standing balance-Leahy  Scale: Poor                             ADL either performed or assessed with clinical judgement   ADL Overall ADL's : Needs assistance/impaired Eating/Feeding: Independent;Sitting   Grooming: Wash/dry hands;Wash/dry face;Standing;Min guard   Upper Body Bathing: Min guard;Sitting   Lower Body Bathing: Moderate assistance;Sitting/lateral leans;Sit to/from stand   Upper Body Dressing : Min guard;Sitting   Lower Body Dressing: Moderate assistance;Sitting/lateral leans;Sit to/from stand   Toilet Transfer: Minimal assistance;Min guard;Ambulation;RW;Cueing for safety   Toileting- Clothing Manipulation and Hygiene: Minimal assistance       Functional mobility during ADLs: Minimal assistance;Min guard;Cueing for safety;Rolling walker       Vision Baseline Vision/History: Wears glasses Wears Glasses: At all times Patient Visual Report: No change from baseline       Perception     Praxis      Pertinent Vitals/Pain Pain Assessment: No/denies pain     Hand Dominance Right   Extremity/Trunk Assessment Upper Extremity Assessment Upper Extremity Assessment: Generalized weakness   Lower Extremity Assessment Lower Extremity Assessment: Defer to PT evaluation   Cervical / Trunk Assessment Cervical / Trunk Assessment: Kyphotic   Communication Communication Communication: No difficulties   Cognition Arousal/Alertness: Awake/alert Behavior During Therapy: WFL for tasks assessed/performed Overall Cognitive Status: No family/caregiver present to determine baseline cognitive functioning  General Comments: Poor safety awareness   General Comments       Exercises     Shoulder Instructions      Home Living Family/patient expects to be discharged to:: Private residence Living Arrangements: Alone Available Help at Discharge: Other (Comment)(pt reports that there is no one to check on him) Type of Home:  Apartment Home Access: Elevator           Bathroom Shower/Tub: Teacher, early years/pre: Standard     Home Equipment: Cane - single point;Grab bars - tub/shower          Prior Functioning/Environment Level of Independence: Independent with assistive device(s)  Gait / Transfers Assistance Needed: used cane ADL's / Homemaking Assistance Needed: indpendent with ADLs/selfcare, home mgt            OT Problem List: Decreased activity tolerance;Decreased knowledge of use of DME or AE;Decreased cognition;Impaired balance (sitting and/or standing);Decreased safety awareness      OT Treatment/Interventions: Self-care/ADL training;DME and/or AE instruction;Therapeutic activities;Therapeutic exercise;Patient/family education    OT Goals(Current goals can be found in the care plan section) Acute Rehab OT Goals Patient Stated Goal: "go to rehab place, then go home" OT Goal Formulation: With patient Time For Goal Achievement: 11/16/18 Potential to Achieve Goals: Good ADL Goals Pt Will Perform Grooming: with supervision;with set-up;standing Pt Will Perform Upper Body Bathing: with supervision;with set-up;sitting Pt Will Perform Lower Body Bathing: with min assist;sitting/lateral leans;sit to/from stand Pt Will Perform Upper Body Dressing: with supervision;with set-up;sitting Pt Will Transfer to Toilet: with min guard assist;with supervision;ambulating Pt Will Perform Toileting - Clothing Manipulation and hygiene: with min guard assist;with supervision;sit to/from stand  OT Frequency: Min 2X/week   Barriers to D/C: Decreased caregiver support          Co-evaluation              AM-PAC OT "6 Clicks" Daily Activity     Outcome Measure Help from another person eating meals?: None Help from another person taking care of personal grooming?: A Little Help from another person toileting, which includes using toliet, bedpan, or urinal?: A Little Help from another person  bathing (including washing, rinsing, drying)?: A Lot Help from another person to put on and taking off regular upper body clothing?: A Little Help from another person to put on and taking off regular lower body clothing?: A Lot 6 Click Score: 17   End of Session Equipment Utilized During Treatment: Gait belt;Rolling walker;Other (comment)(3 in 1 over toilet)  Activity Tolerance: Patient tolerated treatment well Patient left: in chair;with call bell/phone within reach;with chair alarm set  OT Visit Diagnosis: Unsteadiness on feet (R26.81);Other abnormalities of gait and mobility (R26.89);Muscle weakness (generalized) (M62.81)                Time: 9381-0175 OT Time Calculation (min): 26 min Charges:  OT General Charges $OT Visit: 1 Visit OT Evaluation $OT Eval Moderate Complexity: 1 Mod    Britt Bottom 11/02/2018, 1:50 PM

## 2018-11-02 NOTE — NC FL2 (Signed)
[place] MEDICAID FL2 LEVEL OF CARE SCREENING TOOL     IDENTIFICATION  Patient Name: Bruce Hayes Birthdate: 01/18/1946 Sex: male Admission Date (Current Location): 10/30/2018  [place] and [place] Number:  Kathleen Argue [id number] [place] and Address:  [facility], [place] [address], [place], [place] [address]      Provider Number: [id number]  Attending Physician Name and Address:  Bonnell Public, MD  Relative Name and Phone Number:  [name], sister, [phone]    Current Level of Care: Hospital Recommended Level of Care: [place] Prior Approval Number:    Date Approved/Denied:   PASRR Number: [id number]  Discharge Plan: SNF    Current Diagnoses: Patient Active Problem List   Diagnosis Date Noted  . Acute metabolic encephalopathy 16/60/6301  . Hepatic encephalopathy ([place]) 10/31/2018  . Encounter for colonoscopy due to history of adenomatous colonic polyps   . Benign neoplasm of ascending colon   . Other cirrhosis of liver ([place])   . Gastritis and gastroduodenitis   . Portal hypertensive gastropathy ([place])   . Hypertensive urgency 08/27/2016  . Acute kidney injury superimposed on CKD ([place]) 08/27/2016  . Headache 08/27/2016  . Blurred vision 08/27/2016  . AKI (acute kidney injury) ([place]) 08/27/2016  . Severe alcohol use disorder ([place]) 11/11/2015  . Alcohol use disorder, severe, dependence ([place]) 11/08/2015  . Alcohol abuse 12/07/2013  . Cocaine abuse ([place]) 12/07/2013  . Polysubstance abuse ([place]) 12/07/2013  . Alcohol use disorder, moderate, dependence ([place]) 12/07/2013  . Depression   . 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 CKD (chronic kidney disease), stage III 09/06/2007  . HEPATITIS B CARRIER 07/16/2007  . UNSPECIFIED DISORDER OF LIVER 07/03/2007  . HEPATITIS C 05/22/2007  . HYPERLIPIDEMIA 05/22/2007  . OBESITY 05/22/2007  . THROMBOCYTOPENIA 05/22/2007  . TOBACCO ABUSE  05/22/2007  . Essential hypertension 05/22/2007  . COCAINE ABUSE, HX OF 05/22/2007    Orientation RESPIRATION BLADDER Height & Weight     Self, Situation, Place, Time  Normal Continent Weight: 218 lb 14.7 oz (99.3 kg) Height:  5\' 9"  (175.3 cm)  BEHAVIORAL SYMPTOMS/MOOD NEUROLOGICAL BOWEL NUTRITION STATUS      Continent Diet(see discharge summary)  AMBULATORY STATUS COMMUNICATION OF NEEDS Skin   Extensive Assist Verbally Normal                       Personal Care Assistance Level of Assistance  Bathing, Feeding, Dressing Bathing Assistance: Maximum assistance Feeding assistance: Independent Dressing Assistance: Maximum assistance     Functional Limitations Info  Sight, Hearing, Speech Sight Info: Adequate Hearing Info: Adequate Speech Info: Adequate    SPECIAL CARE FACTORS FREQUENCY  OT (By licensed OT), PT (By licensed PT)     PT Frequency: 5x week OT Frequency: 5x week            Contractures Contractures Info: Not present    Additional Factors Info  Code Status, Allergies Code Status Info: Full Code Allergies Info: Tylenol (Acetaminophen)           Current Medications (11/02/2018):  This is the current hospital active medication list Current Facility-Administered Medications  Medication Dose Route Frequency Provider Last Rate Last Dose  . amLODipine (NORVASC) tablet 10 mg  10 mg Oral Daily Rise Patience, MD   10 mg at 11/02/18 0839  . carvedilol (COREG) tablet 3.125 mg  3.125 mg Oral BID WC Dana Allan I, MD   3.125 mg at 11/02/18 0756  .  cloNIDine (CATAPRES - Dosed in mg/24 hr) patch 0.3 mg  0.3 mg Transdermal Weekly Dana Allan I, MD   0.3 mg at 11/01/18 1615  . hydrALAZINE (APRESOLINE) injection 10 mg  10 mg Intravenous Q4H PRN Rise Patience, MD   10 mg at 10/31/18 1737  . lactulose (CHRONULAC) 10 GM/15ML solution 30 g  30 g Oral TID Rise Patience, MD   30 g at 11/01/18 2216  . levothyroxine (SYNTHROID) tablet 50 mcg   50 mcg Oral Q0600 Rise Patience, MD   50 mcg at 11/02/18 2423  . ondansetron (ZOFRAN) tablet 4 mg  4 mg Oral Q6H PRN Rise Patience, MD       Or  . ondansetron [facility]) injection 4 mg  4 mg Intravenous Q6H PRN Rise Patience, MD      . pantoprazole (PROTONIX) EC tablet 40 mg  40 mg Oral Daily Rise Patience, MD   40 mg at 11/02/18 0839  . thiamine 500mg  in normal saline (37ml) IVPB  500 mg Intravenous TID Rise Patience, MD 100 mL/hr at 11/02/18 0841 500 mg at 11/02/18 5361     Discharge Medications: Please see discharge summary for a list of discharge medications.  Relevant Imaging Results:  Relevant Lab Results:   Additional Information [id number] [place], [place]

## 2018-11-02 NOTE — Progress Notes (Signed)
PROGRESS NOTE    Bruce Hayes   WHQ:[id number]  DOB: Feb 01, 1945  DOA: 10/30/2018 PCP: Lorene Dy, MD   Brief Narrative:  Bruce Hayes is a 73 y.o. 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  Per patient's sister patient sister had talked to the patient 2 days ago over the phone and looked fine.  Yesterday patient's neighbor saw that patient's book he was left on the door and went to check on to the patient.  Patient was found on the floor confused.  Patient on coming to the ER had talked to the ER physician that he was watching some game and later passed out.   In the ER patient appeared confused with periods of confusion waxing and waning.  CT head is unremarkable MRI brain shows features concerning for hepatic encephalopathy. CK levels were around 3000.  Labs show creatinine around 2.6 AST 128 ALT 56 ammonia level was 82.  11/02/2018: Patient seen.  No new changes.  Patient is awaiting disposition.  Subjective: No new complaints. No fever or chills. No chest pain. No shortness of breath.    Assessment & Plan: Acute metabolic encephalopathy: -MRI brain:New or increased intrinsic T1 signal in the globus pallidus since 2018. This can be seen as a complication of hepatic insufficiency/cirrhosis, recommend correlation with serum ammonia levels. -Suspected to be hepatic encephalopathy- he has improved-continue lactulose and IV thiamine 11/01/2018: Acute encephalopathy has resolved.  Pursue disposition.   AKI on CKD 3 and rhabdomyolysis: - Last creatinine was 1.74 in 2018-when admitted, it was 2.61 and has improved only slightly to 2.52 - Rhabdomyolysis is secondary to laying on the floor -We will continue slow IV fluids -Repeat CK tomorrow morning- -Holding lisinopril and  chlorthalidone 11/01/2018: Serum creatinine is 2.44 today.  CPK is 2270.  Continue to monitor renal function.  Metabolic acidosis - Likely related to chronic liver disease (likely compensatory).  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  Hypertensive urgency -Increase clonidine patch from 0.2 mg to 0.3 mg patch. -Add Coreg 3.125 mg p.o. twice daily -Monitor heart rate closely -Continue Norvasc Further management will depend on hospital course  Hypothyroidism -Continue Synthroid    Cocaine abuse  -Counseled.    Anemia: -Likely anemia of chronic inflammation.    Time spent in minutes: 35 minutes DVT prophylaxis: SCDs Code Status: Full code Family Communication:  Disposition Plan: Awaiting SNF Consultants:   None Procedures:   None Antimicrobials:  Anti-infectives (From admission, onward)   None       Objective: Vitals:   11/01/18 1700 11/01/18 2106 11/02/18 0519 11/02/18 0919  BP: (!) 155/69 (!) 178/82 (!) 171/71 (!) 175/68  Pulse: 86 82 76 75  Resp: 20 18 19 18   Temp: 98 F (36.7 C) 98.6 F (37 C) 98.8 F (37.1 C) 98.4 F (36.9 C)  TempSrc:  Oral Oral Oral  SpO2:  99% 95% 100%  Weight:      Height:        Intake/Output Summary (Last 24 hours) at 11/02/2018 1443 Last data filed at 11/02/2018 1053 Gross per 24 hour  Intake 640 ml  Output 400 ml  Net 240 ml   Filed Weights   10/30/18 2210  Weight: 99.3 kg    Examination: General exam: Appears comfortable  HEENT:  PERRLA, oral mucosa moist, no sclera icterus or thrush Respiratory system: Clear to auscultation. Respiratory effort normal. Cardiovascular system: S1 & S2 heard, RRR.   Gastrointestinal system: Abdomen soft, non-tender, nondistended. Normal bowel sounds. Central nervous system: Alert and oriented. No focal neurological deficits. Extremities: No cyanosis, clubbing or edema Skin: No rashes or ulcers  Psychiatry:  Mood & affect appropriate.    Data Reviewed: I have personally reviewed following labs and imaging studies  CBC: Recent Labs  Lab 10/30/18 2230  WBC 6.3  NEUTROABS 3.4  HGB 11.5*  HCT 32.4*  MCV 83.9  PLT 99*   Basic Metabolic Panel: Recent Labs  Lab 10/30/18 2230 10/31/18 0822 11/01/18 0405  NA 146* 145 144  K 4.2 4.1 3.9  CL 120* 119* 120*  CO2 16* 16* 16*  GLUCOSE 90 92 98  BUN 29* 31* 27*  CREATININE 2.61* 2.52* 2.44*  CALCIUM 8.3* 8.4* 8.0*   GFR: Estimated Creatinine Clearance: 32.2 mL/min (A) (by C-G formula based on SCr of 2.44 mg/dL (H)). Liver Function Tests: Recent Labs  Lab 10/30/18 2230  AST 128*  ALT 56*  ALKPHOS 154*  BILITOT 1.4*  PROT 7.6  ALBUMIN 2.3*   No results for input(s): LIPASE, AMYLASE in the last 168 hours. Recent Labs  Lab 10/30/18 2230  AMMONIA 82*   Coagulation Profile: Recent Labs  Lab 11/01/18 0405  INR 1.3*   Cardiac Enzymes: Recent Labs  Lab 10/31/18 0323 11/01/18 0405  CKTOTAL 3,250* 2,270*   BNP (last 3 results) No results for input(s): PROBNP in the last 8760 hours. HbA1C: No results for input(s): HGBA1C in the last 72 hours. CBG: Recent Labs  Lab 11/01/18 0715 11/01/18 1129 11/01/18 1627 11/02/18 0005 11/02/18 1130  GLUCAP 90 104* 101* 101* 96   Lipid Profile: No results for input(s): CHOL, HDL, LDLCALC, TRIG, CHOLHDL, LDLDIRECT in the last 72 hours. Thyroid Function Tests: Recent Labs    10/31/18 0822  TSH 3.938   Anemia Panel: Recent Labs    10/31/18 1715 11/01/18 0405  VITAMINB12  --  887  FOLATE 14.0  --   FERRITIN 132  --   TIBC 286  --   IRON 77  --   RETICCTPCT 1.4  --    Urine analysis:    Component Value Date/Time   COLORURINE YELLOW 10/31/2018 0211   APPEARANCEUR CLEAR 10/31/2018 0211   LABSPEC 1.015 10/31/2018 0211   PHURINE 5.0 10/31/2018 0211   GLUCOSEU NEGATIVE 10/31/2018 0211   HGBUR MODERATE (A) 10/31/2018 0211   BILIRUBINUR NEGATIVE 10/31/2018  0211   KETONESUR NEGATIVE 10/31/2018 0211   PROTEINUR 100 (A) 10/31/2018 0211   UROBILINOGEN 4.0 (H) 03/31/2014 0920   NITRITE NEGATIVE 10/31/2018 0211   LEUKOCYTESUR NEGATIVE 10/31/2018 0211   Sepsis Labs: @LABRCNTIP (procalcitonin:4,lacticidven:4) ) Recent Results (from the past 240 hour(s))  SARS CORONAVIRUS 2 (TAT 6-24 HRS) Nasopharyngeal Nasopharyngeal Swab     Status: None   Collection Time: 10/31/18  1:25 AM   Specimen: Nasopharyngeal Swab  Result Value Ref Range Status   SARS Coronavirus 2 NEGATIVE NEGATIVE Final    Comment: (NOTE) SARS-CoV-2 target nucleic acids are NOT DETECTED. The SARS-CoV-2 RNA is generally detectable in upper and lower respiratory specimens during the acute phase of infection. Negative results do not preclude SARS-CoV-2 infection, do not rule out co-infections with other pathogens, and should not be used as the sole basis for treatment or other patient management decisions. Negative results must be combined with clinical observations, patient history, and epidemiological  information. The expected result is Negative. Fact Sheet for Patients: SugarRoll.be Fact Sheet for Healthcare Providers: https://www.woods-mathews.com/ This test is not yet approved or cleared by the [place] FDA and  has been authorized for detection and/or diagnosis of SARS-CoV-2 by FDA under an Emergency Use Authorization (EUA). This EUA will remain  in effect (meaning this test can be used) for the duration of the COVID-19 declaration under Section 56 4(b)(1) of the Act, 21 U.S.C. section 360bbb-3(b)(1), unless the authorization is terminated or revoked sooner. Performed at [facility], [place] [address], [place], [place] [address]   Urine culture     Status: None   Collection Time: 10/31/18  2:11 AM   Specimen: Urine, Random  Result Value Ref Range Status   Specimen Description URINE, RANDOM  Final   Special Requests NONE   Final   Culture   Final    NO GROWTH Performed at [facility], [place] [address], [place], [place] [address]    Report Status 10/31/2018 FINAL  Final         Radiology Studies: No results found.    Scheduled Meds: . amLODipine  10 mg Oral Daily  . carvedilol  3.125 mg Oral BID WC  . cloNIDine  0.3 mg Transdermal Weekly  . lactulose  30 g Oral TID  . levothyroxine  50 mcg Oral Q0600  . pantoprazole  40 mg Oral Daily   Continuous Infusions:    LOS: 2 days      Bonnell Public, MD [facility] Pager: [email or web address] Password [email or web address] 11/02/2018, 2:43 PM

## 2018-11-03 LAB — CBC WITH DIFFERENTIAL/PLATELET
Abs Immature Granulocytes: 0.01 10*3/uL (ref 0.00–0.07)
Basophils Absolute: 0 10*3/uL (ref 0.0–0.1)
Basophils Relative: 1 %
Eosinophils Absolute: 0.2 10*3/uL (ref 0.0–0.5)
Eosinophils Relative: 4 %
HCT: 28.7 % — ABNORMAL LOW (ref 39.0–52.0)
Hemoglobin: 10.2 g/dL — ABNORMAL LOW (ref 13.0–17.0)
Immature Granulocytes: 0 %
Lymphocytes Relative: 40 %
Lymphs Abs: 1.8 10*3/uL (ref 0.7–4.0)
MCH: 29.7 pg (ref 26.0–34.0)
MCHC: 35.5 g/dL (ref 30.0–36.0)
MCV: 83.7 fL (ref 80.0–100.0)
Monocytes Absolute: 0.9 10*3/uL (ref 0.1–1.0)
Monocytes Relative: 19 %
Neutro Abs: 1.6 10*3/uL — ABNORMAL LOW (ref 1.7–7.7)
Neutrophils Relative %: 36 %
Platelets: 66 10*3/uL — ABNORMAL LOW (ref 150–400)
RBC: 3.43 MIL/uL — ABNORMAL LOW (ref 4.22–5.81)
RDW: 15.5 % (ref 11.5–15.5)
WBC: 4.5 10*3/uL (ref 4.0–10.5)
nRBC: 0 % (ref 0.0–0.2)

## 2018-11-03 LAB — RENAL FUNCTION PANEL
Albumin: 1.8 g/dL — ABNORMAL LOW (ref 3.5–5.0)
Anion gap: 6 (ref 5–15)
BUN: 27 mg/dL — ABNORMAL HIGH (ref 8–23)
CO2: 16 mmol/L — ABNORMAL LOW (ref 22–32)
Calcium: 7.7 mg/dL — ABNORMAL LOW (ref 8.9–10.3)
Chloride: 119 mmol/L — ABNORMAL HIGH (ref 98–111)
Creatinine, Ser: 2.08 mg/dL — ABNORMAL HIGH (ref 0.61–1.24)
GFR calc Af Amer: 36 mL/min — ABNORMAL LOW (ref >60)
GFR calc non Af Amer: 31 mL/min — ABNORMAL LOW (ref >60)
Glucose, Bld: 108 mg/dL — ABNORMAL HIGH (ref 70–99)
Phosphorus: 3.8 mg/dL (ref 2.5–4.6)
Potassium: 4.3 mmol/L (ref 3.5–5.1)
Sodium: 141 mmol/L (ref 135–145)

## 2018-11-03 LAB — GLUCOSE, CAPILLARY
Glucose-Capillary: 106 mg/dL — ABNORMAL HIGH (ref 70–99)
Glucose-Capillary: 136 mg/dL — ABNORMAL HIGH (ref 70–99)
Glucose-Capillary: 136 mg/dL — ABNORMAL HIGH (ref 70–99)
Glucose-Capillary: 93 mg/dL (ref 70–99)

## 2018-11-03 LAB — VITAMIN B1: Vitamin B1 (Thiamine): 86.7 nmol/L (ref 66.5–200.0)

## 2018-11-03 LAB — MAGNESIUM: Magnesium: 2.1 mg/dL (ref 1.7–2.4)

## 2018-11-03 NOTE — Progress Notes (Signed)
PROGRESS NOTE    Bruce Hayes   DXI:[id number]  DOB: Jul 04, 1945  DOA: 10/30/2018 PCP: Lorene Dy, MD   Brief Narrative:  Bruce Hayes is a 73 y.o. 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  Per patient's sister patient sister had talked to the patient 2 days ago over the phone and looked fine.  Yesterday patient's neighbor saw that patient's book he was left on the door and went to check on to the patient.  Patient was found on the floor confused.  Patient on coming to the ER had talked to the ER physician that he was watching some game and later passed out.   In the ER patient appeared confused with periods of confusion waxing and waning.  CT head is unremarkable MRI brain shows features concerning for hepatic encephalopathy. CK levels were around 3000.  Last serum creatinine was 2.08.  AST 128, ALT 56, ammonia level was 82.  11/03/2018: Patient seen.  No new changes.  Patient is awaiting disposition.  Subjective: No new complaints. No fever or chills. No chest pain. No shortness of breath.    Assessment & Plan: Acute metabolic encephalopathy: -MRI brain:New or increased intrinsic T1 signal in the globus pallidus since 2018 (This can be seen as a complication of hepatic insufficiency/cirrhosis) -Suspected to be hepatic encephalopathy- he has improved-continue lactulose. 11/03/2018: Acute encephalopathy has resolved.  Pursue disposition.   AKI on CKD 3 and rhabdomyolysis: - Last creatinine was 1.74 in 2018-when admitted, it was 2.61 and has improved only slightly to 2.52 - Rhabdomyolysis is secondary to laying on the floor -We will continue slow IV fluids -Repeat CK tomorrow morning- -Holding lisinopril and chlorthalidone 11/01/2018: CPK was 2,270.   11/03/2018: Serum  creatinine today is 2.08.  Metabolic acidosis - Likely related to chronic liver disease (likely compensatory).  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  Hypertensive urgency -Increase clonidine patch from 0.2 mg to 0.3 mg patch. -Add Coreg 3.125 mg p.o. twice daily -Monitor heart rate closely -Continue Norvasc 11/03/2018: BP is better controlled.  Hypothyroidism -Continue Synthroid    Cocaine abuse  -Counseled.    Anemia: -Likely anemia of chronic inflammation.    Time spent in minutes: 25 minutes DVT prophylaxis: SCDs Code Status: Full code Family Communication:  Disposition Plan: Awaiting SNF Consultants:   None Procedures:   None Antimicrobials:  Anti-infectives (From admission, onward)   None      Objective: Vitals:   11/02/18 1700 11/02/18 2104 11/03/18 0403 11/03/18 0908  BP: (!) 174/74 (!) 182/76 (!) 148/70 (!) 131/56  Pulse: 75 77 70 66  Resp: 18 20 18 18   Temp: 98.7 F (37.1 C) 99.2 F (37.3 C) 98.4 F (36.9 C) 97.7 F (36.5 C)  TempSrc: Oral Oral Oral Oral  SpO2: 100% 100% 97% 99%  Weight:      Height:        Intake/Output Summary (Last 24 hours) at 11/03/2018 1029 Last data filed at 11/03/2018 0600 Gross per 24 hour  Intake 600 ml  Output 550 ml  Net 50 ml   Filed Weights   10/30/18 2210  Weight: 99.3 kg    Examination: General exam: Appears comfortable  HEENT: PERRLA, oral mucosa moist, no sclera icterus or thrush Respiratory system: Clear to auscultation. Respiratory  effort normal. Cardiovascular system: S1 & S2 heard, RRR.   Gastrointestinal system: Abdomen soft, non-tender, nondistended. Normal bowel sounds. Central nervous system: Alert and oriented. No focal neurological deficits. Extremities: No cyanosis, clubbing or edema Skin: No rashes or ulcers Psychiatry:  Mood & affect appropriate.    Data Reviewed: I have personally reviewed  following labs and imaging studies  CBC: Recent Labs  Lab 10/30/18 2230  WBC 6.3  NEUTROABS 3.4  HGB 11.5*  HCT 32.4*  MCV 83.9  PLT 99*   Basic Metabolic Panel: Recent Labs  Lab 10/30/18 2230 10/31/18 0822 11/01/18 0405  NA 146* 145 144  K 4.2 4.1 3.9  CL 120* 119* 120*  CO2 16* 16* 16*  GLUCOSE 90 92 98  BUN 29* 31* 27*  CREATININE 2.61* 2.52* 2.44*  CALCIUM 8.3* 8.4* 8.0*   GFR: Estimated Creatinine Clearance: 32.2 mL/min (A) (by C-G formula based on SCr of 2.44 mg/dL (H)). Liver Function Tests: Recent Labs  Lab 10/30/18 2230  AST 128*  ALT 56*  ALKPHOS 154*  BILITOT 1.4*  PROT 7.6  ALBUMIN 2.3*   No results for input(s): LIPASE, AMYLASE in the last 168 hours. Recent Labs  Lab 10/30/18 2230  AMMONIA 82*   Coagulation Profile: Recent Labs  Lab 11/01/18 0405  INR 1.3*   Cardiac Enzymes: Recent Labs  Lab 10/31/18 0323 11/01/18 0405  CKTOTAL 3,250* 2,270*   BNP (last 3 results) No results for input(s): PROBNP in the last 8760 hours. HbA1C: No results for input(s): HGBA1C in the last 72 hours. CBG: Recent Labs  Lab 11/02/18 0005 11/02/18 1130 11/02/18 1659 11/02/18 2359 11/03/18 0607  GLUCAP 101* 96 96 93 136*   Lipid Profile: No results for input(s): CHOL, HDL, LDLCALC, TRIG, CHOLHDL, LDLDIRECT in the last 72 hours. Thyroid Function Tests: No results for input(s): TSH, T4TOTAL, FREET4, T3FREE, THYROIDAB in the last 72 hours. Anemia Panel: Recent Labs    10/31/18 1715 11/01/18 0405  VITAMINB12  --  887  FOLATE 14.0  --   FERRITIN 132  --   TIBC 286  --   IRON 77  --   RETICCTPCT 1.4  --    Urine analysis:    Component Value Date/Time   COLORURINE YELLOW 10/31/2018 0211   APPEARANCEUR CLEAR 10/31/2018 0211   LABSPEC 1.015 10/31/2018 0211   PHURINE 5.0 10/31/2018 0211   GLUCOSEU NEGATIVE 10/31/2018 0211   HGBUR MODERATE (A) 10/31/2018 0211   BILIRUBINUR NEGATIVE 10/31/2018 0211   KETONESUR NEGATIVE 10/31/2018 0211    PROTEINUR 100 (A) 10/31/2018 0211   UROBILINOGEN 4.0 (H) 03/31/2014 0920   NITRITE NEGATIVE 10/31/2018 0211   LEUKOCYTESUR NEGATIVE 10/31/2018 0211   Sepsis Labs: @LABRCNTIP (procalcitonin:4,lacticidven:4) ) Recent Results (from the past 240 hour(s))  SARS CORONAVIRUS 2 (TAT 6-24 HRS) Nasopharyngeal Nasopharyngeal Swab     Status: None   Collection Time: 10/31/18  1:25 AM   Specimen: Nasopharyngeal Swab  Result Value Ref Range Status   SARS Coronavirus 2 NEGATIVE NEGATIVE Final    Comment: (NOTE) SARS-CoV-2 target nucleic acids are NOT DETECTED. The SARS-CoV-2 RNA is generally detectable in upper and lower respiratory specimens during the acute phase of infection. Negative results do not preclude SARS-CoV-2 infection, do not rule out co-infections with other pathogens, and should not be used as the sole basis for treatment or other patient management decisions. Negative results must be combined with clinical observations, patient history, and epidemiological information. The expected result is Negative. Fact Sheet for Patients: SugarRoll.be Fact Sheet  for Healthcare Providers: https://www.woods-mathews.com/ This test is not yet approved or cleared by the [place] and  has been authorized for detection and/or diagnosis of SARS-CoV-2 by FDA under an Emergency Use Authorization (EUA). This EUA will remain  in effect (meaning this test can be used) for the duration of the COVID-19 declaration under Section 56 4(b)(1) of the Act, 21 U.S.C. section 360bbb-3(b)(1), unless the authorization is terminated or revoked sooner. Performed at [facility], [place] [address] [place], [place] [address]   Urine culture     Status: None   Collection Time: 10/31/18  2:11 AM   Specimen: Urine, Random  Result Value Ref Range Status   Specimen Description URINE, RANDOM  Final   Special Requests NONE  Final   Culture   Final    NO GROWTH  Performed at [facility], [place] [address], [place], [place] [address]    Report Status 10/31/2018 FINAL  Final         Radiology Studies: No results found.    Scheduled Meds: . amLODipine  10 mg Oral Daily  . carvedilol  3.125 mg Oral BID WC  . cloNIDine  0.3 mg Transdermal Weekly  . lactulose  30 g Oral TID  . levothyroxine  50 mcg Oral Q0600  . pantoprazole  40 mg Oral Daily   Continuous Infusions:    LOS: 3 days   Bonnell Public, MD [facility] Pager: [email or web address] Password [email or web address] 11/03/2018, 10:29 AM

## 2018-11-03 NOTE — Plan of Care (Signed)
  Problem: Education: Goal: Knowledge of General Education information will improve Description Including pain rating scale, medication(s)/side effects and non-pharmacologic comfort measures Outcome: Progressing   

## 2018-11-03 NOTE — Plan of Care (Signed)
  Problem: Nutrition: Goal: Adequate nutrition will be maintained Outcome: Progressing   

## 2018-11-03 NOTE — Progress Notes (Signed)
Patient refused bed alarm. This RN explained and educated pt. Will continue to monitor.

## 2018-11-03 NOTE — Progress Notes (Signed)
Patient refusing bed alarm.   Farley Ly RN

## 2018-11-04 LAB — GLUCOSE, CAPILLARY
Glucose-Capillary: 103 mg/dL — ABNORMAL HIGH (ref 70–99)
Glucose-Capillary: 104 mg/dL — ABNORMAL HIGH (ref 70–99)
Glucose-Capillary: 130 mg/dL — ABNORMAL HIGH (ref 70–99)
Glucose-Capillary: 136 mg/dL — ABNORMAL HIGH (ref 70–99)
Glucose-Capillary: 84 mg/dL (ref 70–99)

## 2018-11-04 MED ORDER — LACTULOSE 10 GM/15ML PO SOLN
20.0000 g | Freq: Three times a day (TID) | ORAL | Status: DC
  Filled 2018-11-04: qty 30

## 2018-11-04 MED ORDER — FOLIC ACID 1 MG PO TABS
1.0000 mg | ORAL_TABLET | Freq: Every day | ORAL | Status: DC
  Administered 2018-11-04 – 2018-11-05 (×2): 1 mg via ORAL
  Filled 2018-11-04 (×2): qty 1

## 2018-11-04 MED ORDER — VITAMIN B-1 100 MG PO TABS
100.0000 mg | ORAL_TABLET | Freq: Every day | ORAL | Status: DC
  Administered 2018-11-04 – 2018-11-05 (×2): 100 mg via ORAL
  Filled 2018-11-04 (×2): qty 1

## 2018-11-04 NOTE — Progress Notes (Signed)
Patient refusing lactulose. Patient educated on how the lactulose works to decrease ammonia levels. Patient states "I don't care I'm not running back and forth to the bathroom". Will continue to monitor. Dorthey Sawyer, RN

## 2018-11-04 NOTE — Care Management Important Message (Signed)
Important Message  Patient Details  Name: Bruce Hayes MRN: [id number] Date of Birth: Jun 11, 1945   Medicare Important Message Given:  Yes     [name] 11/04/2018, 4:08 PM

## 2018-11-04 NOTE — TOC Progression Note (Addendum)
Transition of Care [facility]) - Progression Note    Patient Details  Name: Bruce Hayes MRN: [id number] Date of Birth: 1945/07/18  [facility] CM/SW Contact  [name], LCSW Phone Number: 11/04/2018, 2:36 PM  Clinical Narrative:  Visited with patient at the bedside and provided [name] with bed offers. He chose [name]. Call made to [facility], admissions director and messages left (work cell and business phone) regarding patient choosing their facility for ST rehab. Talked with MD by phone and provided update.   Expected Discharge Plan: Skilled Nursing Facility Barriers to Discharge: Continued Medical Work up  Expected Discharge Plan and Services Expected Discharge Plan: [place] In-house Referral: Clinical Social Work Discharge Planning Services: CM Consult Post Acute Care Choice: [place] Living arrangements for the past 2 months: Apartment                                     Social Determinants of Health (SDOH) Interventions    Readmission Risk Interventions Readmission Risk Prevention Plan 11/02/2018  Transportation Screening Complete  PCP or Specialist Appt within 3-5 Days Not Complete  Not Complete comments plan for SNF  HRI or [place] Not Complete  HRI or Home Care Consult comments plan for SNF  Social Work Consult for [place] Planning/Counseling Complete  Palliative Care Screening Not Applicable  Medication Review (RN Care Manager) Complete  Some recent data might be hidden

## 2018-11-04 NOTE — Progress Notes (Signed)
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  CT head was unremarkable.  MRI of the brain was concerning for hepatic encephalopathy.  CK level,  Ammonia was elevated on presentation along with liver enzymes.  He was started on lactulose.  Currently his mental status is at baseline.  PT/OT evaluated him and recommended skilled nursing facility on discharge.  Patient is hemodynamically stable for discharge to skilled nursing facility soon as the bed is available.  Assessment & Plan:   Principal Problem:   Acute metabolic encephalopathy Active Problems:   OBESITY   TOBACCO ABUSE   Essential hypertension   CKD (chronic kidney disease), stage III   Cocaine abuse (HCC)   Polysubstance abuse ([place])   Hypertensive urgency   Other cirrhosis of liver (HCC)   Portal hypertensive gastropathy (HCC)   Hepatic encephalopathy (HCC)   Acute metabolic encephalopathy: Most likely associated with hepatic encephalopathy.  MRI suggestive of this.  Ammonia level found to elevated.  Mental status is currently at baseline.  Continue lactulose.  AKI on CKD stage III/rhabdomyolysis: Last creatinine was 1.74 in 2018.  Presented with creatinine in the range of 2.  Creatinine has slightly improved with IV fluids but has stayed around 2.  This could be his new baseline.  Lisinopril, chlorthalidone on hold. CK elevated on presentation most likely secondary to fall.  Improved.  Alcoholic cirrhosis with history of portal hypertension: Currently stable.  Chronic alcohol abuse: Counseled cessation.  Drinks beer/vodka.  Continue  thiamine and folic acid.  Hypertensive urgency: Continue current meds.  BP has improved.  Hypothyroidism: Continue Synthyroid  Substance abuse/cocaine abuse: Counseled for cessation  Thrombocytopenia: Associated with chronic alcohol abuse.  Currently stable.  Debility/deconditioning/fall: Evaluated by PT/OT and recommended skilled nursing facility on discharge.           DVT prophylaxis:SCD Code Status: Full Family Communication: None present at the bedside Disposition Plan: Skilled nursing facility soon as the bed is available   Consultants: None  Procedures:None  Antimicrobials:  Anti-infectives (From admission, onward)   None      Subjective:  Patient seen and examined the bedside this afternoon.  Hemodynamically stable.  He is little frustrated because he has to go to bathroom quite often due to lactulose.  Patient agrees with SNF placement plan.  Objective: Vitals:   11/03/18 1646 11/03/18 2006 11/04/18 0445 11/04/18 0817  BP: (!) 143/69 (!) 137/96 135/70 (!) 141/62  Pulse: 68 80 66 66  Resp: 16 18 18 18   Temp: 98.2 F (36.8 C) 98.3 F (36.8 C) 98.1 F (36.7 C) 98 F (36.7 C)  TempSrc: Oral Oral Oral Oral  SpO2: 100% 100% 100% 100%  Weight:   100.1 kg   Height:        Intake/Output Summary (Last 24 hours) at 11/04/2018 1447 Last data filed at 11/04/2018 0200 Gross per 24 hour  Intake 840 ml  Output 700 ml  Net 140 ml   Filed Weights   10/30/18 2210 11/04/18 0445  Weight: 99.3 kg 100.1 kg    Examination:  General exam: Not in distress,average built HEENT:PERRL,Oral mucosa moist, Ear/Nose normal on gross exam Respiratory system: Bilateral  equal air entry, normal vesicular breath sounds, no wheezes or crackles  Cardiovascular system: S1 & S2 heard, RRR. No JVD, murmurs, rubs, gallops or clicks. No pedal edema. Gastrointestinal system: Abdomen is nondistended, soft and nontender. No organomegaly or masses felt. Normal bowel sounds heard.  Central nervous system: Alert and oriented. No focal neurological deficits. Extremities: No edema, no clubbing ,no cyanosis, distal peripheral pulses palpable. Skin: No rashes, lesions or ulcers,no icterus ,no pallor    Data Reviewed: I have personally reviewed following labs and imaging studies  CBC: Recent Labs  Lab 10/30/18 2230 11/03/18 1109  WBC 6.3 4.5  NEUTROABS 3.4 1.6*  HGB 11.5* 10.2*  HCT 32.4* 28.7*  MCV 83.9 83.7  PLT 99* 66*   Basic Metabolic Panel: Recent Labs  Lab 10/30/18 2230 10/31/18 0822 11/01/18 0405 11/03/18 1109  NA 146* 145 144 141  K 4.2 4.1 3.9 4.3  CL 120* 119* 120* 119*  CO2 16* 16* 16* 16*  GLUCOSE 90 92 98 108*  BUN 29* 31* 27* 27*  CREATININE 2.61* 2.52* 2.44* 2.08*  CALCIUM 8.3* 8.4* 8.0* 7.7*  MG  --   --   --  2.1  PHOS  --   --   --  3.8   GFR: Estimated Creatinine Clearance: 38 mL/min (A) (by C-G formula based on SCr of 2.08 mg/dL (H)). Liver Function Tests: Recent Labs  Lab 10/30/18 2230 11/03/18 1109  AST 128*  --   ALT 56*  --   ALKPHOS 154*  --   BILITOT 1.4*  --   PROT 7.6  --   ALBUMIN 2.3* 1.8*   No results for input(s): LIPASE, AMYLASE in the last 168 hours. Recent Labs  Lab 10/30/18 2230  AMMONIA 82*   Coagulation Profile: Recent Labs  Lab 11/01/18 0405  INR 1.3*   Cardiac Enzymes: Recent Labs  Lab 10/31/18 0323 11/01/18 0405  CKTOTAL 3,250* 2,270*   BNP (last 3 results) No results for input(s): PROBNP in the last 8760 hours. HbA1C: No results for input(s): HGBA1C in the last 72 hours. CBG: Recent Labs  Lab 11/03/18 1135 11/03/18 1645 11/04/18 0030 11/04/18 0600 11/04/18 1133  GLUCAP 136* 106* 84 103* 104*   Lipid Profile: No results for input(s): CHOL, HDL, LDLCALC, TRIG, CHOLHDL, LDLDIRECT in the last 72 hours. Thyroid Function Tests: No results for input(s): TSH, T4TOTAL, FREET4, T3FREE, THYROIDAB in the last 72 hours. Anemia Panel: No results for input(s): VITAMINB12, FOLATE,  FERRITIN, TIBC, IRON, RETICCTPCT in the last 72 hours. Sepsis Labs: Recent Labs  Lab 10/31/18 0025  LATICACIDVEN 1.6    Recent Results (from the past 240 hour(s))  SARS CORONAVIRUS 2 (TAT 6-24 HRS) Nasopharyngeal Nasopharyngeal Swab     Status: None   Collection Time: 10/31/18  1:25 AM   Specimen: Nasopharyngeal Swab  Result Value Ref Range Status   SARS Coronavirus 2 NEGATIVE NEGATIVE Final    Comment: (NOTE) SARS-CoV-2 target nucleic acids are NOT DETECTED. The SARS-CoV-2 RNA is generally detectable in upper and lower respiratory specimens during the acute phase of infection. Negative results do not preclude SARS-CoV-2 infection, do not rule out co-infections with other pathogens, and should not be used as the sole basis for treatment or other patient management decisions. Negative results must be combined with clinical observations, patient history, and epidemiological information. The expected result is Negative. Fact Sheet for Patients: SugarRoll.be Fact Sheet for Healthcare Providers: https://www.woods-mathews.com/ This test is not yet approved or cleared by the [place] FDA and  has been authorized for detection and/or diagnosis of SARS-CoV-2 by FDA under an Emergency Use Authorization (EUA). This EUA will remain  in effect (meaning this test can be used) for the duration of the COVID-19 declaration under Section 56 4(b)(1) of the Act, 21 U.S.C. section 360bbb-3(b)(1), unless the authorization is terminated or revoked sooner. Performed at [facility], [place] [address] [place], [place] [address]   Urine culture     Status: None   Collection Time: 10/31/18  2:11 AM   Specimen: Urine, Random  Result Value Ref Range Status   Specimen Description URINE, RANDOM  Final   Special Requests NONE  Final   Culture   Final    NO GROWTH Performed at [facility], [place] [address], [place], [place] [address]    Report  Status 10/31/2018 FINAL  Final         Radiology Studies: No results found.      Scheduled Meds: . amLODipine  10 mg Oral Daily  . carvedilol  3.125 mg Oral BID WC  . cloNIDine  0.3 mg Transdermal Weekly  . lactulose  30 g Oral TID  . levothyroxine  50 mcg Oral Q0600  . pantoprazole  40 mg Oral Daily   Continuous Infusions:   LOS: 4 days    Time spent: 35 mins.More than 50% of that time was spent in counseling and/or coordination of care.      Shelly Coss, MD [facility] Pager [phone]  If 7PM-7AM, please contact night-coverage [email or web address] Password [email or web address] 11/04/2018, 2:47 PM

## 2018-11-04 NOTE — Progress Notes (Signed)
Physical Therapy Treatment Patient Details Name: Bruce Hayes MRN: [id number] DOB: 1945-11-02 Today's Date: 11/04/2018    History of Present Illness Pt is a 73 y/o male admitted secondary to hepatic encephalopathy, hypertension, and rhabdomyolysis. PMH includes CKD, polysubstance abuse, liver cirrhosis, HTN, and alcohol abuse.     PT Comments    Continuing work on functional mobility and activity tolerance;  Bruce Hayes was engaged in his therapy, and motivated to get better; lots of questions about assistive devices (he is interested in getting a power chair); Briefly discussed my concern that if he has a power chair, he may become weaker if he stops progressive ambulation;  We talked about a rollator as well, and I encouraged him to consider best assistive devices after post-acute rehab -- and the rehab therapists can continue this conversation  Follow Up Recommendations  SNF;Supervision/Assistance - 24 hour     Equipment Recommendations  Other (comment)(consider a rollator RW)    Recommendations for Other Services       Precautions / Restrictions Precautions Precautions: Fall    Mobility  Bed Mobility                  Transfers Overall transfer level: Needs assistance Equipment used: Rolling walker (2 wheeled) Transfers: Sit to/from Stand Sit to Stand: Min assist         General transfer comment: min A for initial sit - stand from bed and to steady with RW  Ambulation/Gait Ambulation/Gait assistance: Min guard Gait Distance (Feet): 80 Feet(at least) Assistive device: Rolling walker (2 wheeled) Gait Pattern/deviations: Decreased step length - right;Decreased step length - left;Trunk flexed;Wide base of support     General Gait Details: Short, inefficient steps with trunk flexed; multimodal cues to weight shift fully into onto stance LE to allow for bigger stepping swing LE   Stairs             Wheelchair Mobility    Modified Rankin (Stroke  Patients Only)       Balance     Sitting balance-Leahy Scale: Fair       Standing balance-Leahy Scale: Poor                              Cognition Arousal/Alertness: Awake/alert Behavior During Therapy: WFL for tasks assessed/performed Overall Cognitive Status: No family/caregiver present to determine baseline cognitive functioning                                        Exercises      General Comments        Pertinent Vitals/Pain Pain Assessment: No/denies pain    Home Living                      Prior Function            PT Goals (current goals can now be found in the care plan section) Acute Rehab PT Goals Patient Stated Goal: "go to rehab place, then go home" PT Goal Formulation: With patient Time For Goal Achievement: 11/14/18 Potential to Achieve Goals: Good Progress towards PT goals: Progressing toward goals    Frequency    Min 2X/week      PT Plan Current plan remains appropriate    Co-evaluation  AM-PAC PT "6 Clicks" Mobility   Outcome Measure  Help needed turning from your back to your side while in a flat bed without using bedrails?: A Little Help needed moving from lying on your back to sitting on the side of a flat bed without using bedrails?: A Little Help needed moving to and from a bed to a chair (including a wheelchair)?: A Little Help needed standing up from a chair using your arms (e.g., wheelchair or bedside chair)?: A Little Help needed to walk in hospital room?: A Little Help needed climbing 3-5 steps with a railing? : A Lot 6 Click Score: 17    End of Session Equipment Utilized During Treatment: Gait belt Activity Tolerance: Patient tolerated treatment well Patient left: in bed;with call bell/phone within reach;with bed alarm set(sitting EOB to eat lunch) Nurse Communication: Mobility status PT Visit Diagnosis: Unsteadiness on feet (R26.81);Muscle weakness  (generalized) (M62.81)     Time: 2336-1224 PT Time Calculation (min) (ACUTE ONLY): 25 min  Charges:  $Gait Training: 23-37 mins                     Roney Marion, [place]  Acute Rehabilitation Services Pager [phone] Office [place] 11/04/2018, 4:04 PM

## 2018-11-05 LAB — CBC WITH DIFFERENTIAL/PLATELET
Abs Immature Granulocytes: 0.01 10*3/uL (ref 0.00–0.07)
Basophils Absolute: 0 10*3/uL (ref 0.0–0.1)
Basophils Relative: 0 %
Eosinophils Absolute: 0.3 10*3/uL (ref 0.0–0.5)
Eosinophils Relative: 6 %
HCT: 28.3 % — ABNORMAL LOW (ref 39.0–52.0)
Hemoglobin: 9.9 g/dL — ABNORMAL LOW (ref 13.0–17.0)
Immature Granulocytes: 0 %
Lymphocytes Relative: 40 %
Lymphs Abs: 1.9 10*3/uL (ref 0.7–4.0)
MCH: 29.1 pg (ref 26.0–34.0)
MCHC: 35 g/dL (ref 30.0–36.0)
MCV: 83.2 fL (ref 80.0–100.0)
Monocytes Absolute: 0.8 10*3/uL (ref 0.1–1.0)
Monocytes Relative: 17 %
Neutro Abs: 1.7 10*3/uL (ref 1.7–7.7)
Neutrophils Relative %: 37 %
Platelets: 75 10*3/uL — ABNORMAL LOW (ref 150–400)
RBC: 3.4 MIL/uL — ABNORMAL LOW (ref 4.22–5.81)
RDW: 15.1 % (ref 11.5–15.5)
WBC: 4.7 10*3/uL (ref 4.0–10.5)
nRBC: 0 % (ref 0.0–0.2)

## 2018-11-05 LAB — BASIC METABOLIC PANEL
Anion gap: 9 (ref 5–15)
BUN: 29 mg/dL — ABNORMAL HIGH (ref 8–23)
CO2: 17 mmol/L — ABNORMAL LOW (ref 22–32)
Calcium: 8 mg/dL — ABNORMAL LOW (ref 8.9–10.3)
Chloride: 116 mmol/L — ABNORMAL HIGH (ref 98–111)
Creatinine, Ser: 1.87 mg/dL — ABNORMAL HIGH (ref 0.61–1.24)
GFR calc Af Amer: 41 mL/min — ABNORMAL LOW (ref >60)
GFR calc non Af Amer: 35 mL/min — ABNORMAL LOW (ref >60)
Glucose, Bld: 93 mg/dL (ref 70–99)
Potassium: 4.2 mmol/L (ref 3.5–5.1)
Sodium: 142 mmol/L (ref 135–145)

## 2018-11-05 LAB — SARS CORONAVIRUS 2 (TAT 6-24 HRS): SARS Coronavirus 2: NEGATIVE

## 2018-11-05 LAB — GLUCOSE, CAPILLARY
Glucose-Capillary: 101 mg/dL — ABNORMAL HIGH (ref 70–99)
Glucose-Capillary: 107 mg/dL — ABNORMAL HIGH (ref 70–99)

## 2018-11-05 MED ORDER — FOLIC ACID 1 MG PO TABS: 1.0000 mg | ORAL_TABLET | Freq: Every day | ORAL | Status: AC

## 2018-11-05 MED ORDER — THIAMINE HCL 100 MG PO TABS: 100.0000 mg | ORAL_TABLET | Freq: Every day | ORAL | Status: DC

## 2018-11-05 MED ORDER — LACTULOSE 10 GM/15ML PO SOLN: 20.0000 g | Freq: Two times a day (BID) | ORAL | 0 refills | Status: DC

## 2018-11-05 MED ORDER — CARVEDILOL 3.125 MG PO TABS: 3.1250 mg | ORAL_TABLET | Freq: Two times a day (BID) | ORAL | Status: DC

## 2018-11-05 MED ORDER — CLONIDINE 0.3 MG/24HR TD PTWK: 0.3000 mg | MEDICATED_PATCH | TRANSDERMAL | 12 refills | Status: DC

## 2018-11-05 NOTE — Plan of Care (Signed)
  Problem: Activity: Goal: Risk for activity intolerance will decrease Outcome: Progressing   

## 2018-11-05 NOTE — Progress Notes (Signed)
DISCHARGE NOTE SNF [name] to be discharged to [facility] per MD order. Patient verbalized understanding.  Skin clean, dry and intact without evidence of skin break down, no evidence of skin tears noted. IV catheter discontinued intact. Site without signs and symptoms of complications. Dressing and pressure applied. Pt denies pain at the site currently. No complaints noted.  Patient free of lines, drains, and wounds.   Discharge packet assembled. An After Visit Summary (AVS) was printed and given to the EMS personnel. Patient escorted via stretcher and discharged to [facility] via ambulance. Report called to accepting facility; all questions and concerns addressed.   Dorthey Sawyer, RN

## 2018-11-05 NOTE — Progress Notes (Signed)
Report called to [place] at [facility]. Dorthey Sawyer, RN

## 2018-11-05 NOTE — Progress Notes (Signed)
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  CT head was unremarkable.  MRI of the brain was concerning for hepatic encephalopathy.  CK level,  Ammonia was elevated on presentation along with liver enzymes.  He was started on lactulose.  Currently his mental status is at baseline.  PT/OT evaluated him and recommended skilled nursing facility on discharge.  Patient is hemodynamically stable for discharge to skilled nursing facility soon as the bed is available.  Assessment & Plan:   Principal Problem:   Acute metabolic encephalopathy Active Problems:   OBESITY   TOBACCO ABUSE   Essential hypertension   CKD (chronic kidney disease), stage III   Cocaine abuse (HCC)   Polysubstance abuse ([place])   Hypertensive urgency   Other cirrhosis of liver (HCC)   Portal hypertensive gastropathy (HCC)   Hepatic encephalopathy (HCC)   Acute metabolic encephalopathy: Secondary to  hepatic encephalopathy.  MRI suggestive of this.  Ammonia level found to elevated.  Mental status is currently at baseline.  Continue lactulose.  AKI on CKD stage III/rhabdomyolysis: Last creatinine was 1.74 in 2018.  Presented with creatinine in the Hayes of 2.  Creatinine has slightly improved with IV fluids .  Lisinopril, chlorthalidone on hold. CK elevated on presentation most likely secondary to fall.  Improved.  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  Chronic alcohol abuse: Counseled cessation.  Drinks beer/vodka.  Continue thiamine and folic acid.  Hypertensive  urgency: Continue current meds.  BP has improved.  Hypothyroidism: Continue Synthyroid  Substance abuse/cocaine abuse: Counseled for cessation  Thrombocytopenia: Associated with chronic alcohol abuse.  Currently stable.  Debility/deconditioning/fall: Evaluated by PT/OT and recommended skilled nursing facility on discharge.           DVT prophylaxis:SCD Code Status: Full Family Communication: Called sister on phone ,call not received.Will try again Disposition Plan: Skilled nursing facility soon as the bed is available   Consultants: None  Procedures:None  Antimicrobials:  Anti-infectives (From admission, onward)   None      Subjective:  Patient seen and examined the bedside this morning.  Hemodynamically stable.  Comfortable.  Denies any new complaints.  He is still reluctant to take lactulose.  Again counseling done on the importance of taking lactulose at this time.  Objective: Vitals:   11/04/18 2005 11/05/18 0445 11/05/18 0500 11/05/18 1000  BP: (!) 106/55 112/70  137/64  Pulse: 64 68  62  Resp: 14 15  16   Temp: 98.1 F (36.7 C) 98.3 F (36.8 C)  (!) 97.5 F (36.4 C)  TempSrc: Oral Oral  Oral  SpO2: 100% 100%  100%  Weight: 99.4 kg  99.5 kg   Height:        Intake/Output Summary (Last 24 hours) at 11/05/2018 1315 Last data filed at 11/05/2018 0916 Gross per 24 hour  Intake 240 ml  Output 0 ml  Net 240 ml   Filed Weights   11/04/18 0445 11/04/18 2005 11/05/18 0500  Weight: 100.1 kg 99.4 kg 99.5 kg    Examination:  General exam: Not in distress,obese,generalised weakness HEENT:Oral mucosa moist, Ear/Nose normal on gross exam  Respiratory system: Bilateral equal air entry, normal vesicular breath sounds, no wheezes or crackles  Cardiovascular system: S1 & S2 heard, RRR. No JVD, murmurs, rubs, gallops or clicks. Gastrointestinal system: Abdomen is nondistended, soft and nontender. . Normal bowel sounds heard. Central nervous system: Alert and  oriented. Extremities: No edema, no clubbing ,no cyanosis, distal peripheral pulses palpable. Skin: No rashes, lesions or ulcers,no icterus ,no pallor   Data Reviewed: I have personally reviewed following labs and imaging studies  CBC: Recent Labs  Lab 10/30/18 2230 11/03/18 1109 11/05/18 0412  WBC 6.3 4.5 4.7  NEUTROABS 3.4 1.6* 1.7  HGB 11.5* 10.2* 9.9*  HCT 32.4* 28.7* 28.3*  MCV 83.9 83.7 83.2  PLT 99* 66* 75*   Basic Metabolic Panel: Recent Labs  Lab 10/30/18 2230 10/31/18 0822 11/01/18 0405 11/03/18 1109 11/05/18 0412  NA 146* 145 144 141 142  K 4.2 4.1 3.9 4.3 4.2  CL 120* 119* 120* 119* 116*  CO2 16* 16* 16* 16* 17*  GLUCOSE 90 92 98 108* 93  BUN 29* 31* 27* 27* 29*  CREATININE 2.61* 2.52* 2.44* 2.08* 1.87*  CALCIUM 8.3* 8.4* 8.0* 7.7* 8.0*  MG  --   --   --  2.1  --   PHOS  --   --   --  3.8  --    GFR: Estimated Creatinine Clearance: 42.1 mL/min (A) (by C-G formula based on SCr of 1.87 mg/dL (H)). Liver Function Tests: Recent Labs  Lab 10/30/18 2230 11/03/18 1109  AST 128*  --   ALT 56*  --   ALKPHOS 154*  --   BILITOT 1.4*  --   PROT 7.6  --   ALBUMIN 2.3* 1.8*   No results for input(s): LIPASE, AMYLASE in the last 168 hours. Recent Labs  Lab 10/30/18 2230  AMMONIA 82*   Coagulation Profile: Recent Labs  Lab 11/01/18 0405  INR 1.3*   Cardiac Enzymes: Recent Labs  Lab 10/31/18 0323 11/01/18 0405  CKTOTAL 3,250* 2,270*   BNP (last 3 results) No results for input(s): PROBNP in the last 8760 hours. HbA1C: No results for input(s): HGBA1C in the last 72 hours. CBG: Recent Labs  Lab 11/04/18 1133 11/04/18 1632 11/04/18 2336 11/05/18 0549 11/05/18 1137  GLUCAP 104* 130* 136* 101* 107*   Lipid Profile: No results for input(s): CHOL, HDL, LDLCALC, TRIG, CHOLHDL, LDLDIRECT in the last 72 hours. Thyroid Function Tests: No results for input(s): TSH, T4TOTAL, FREET4, T3FREE, THYROIDAB in the last 72 hours. Anemia Panel: No  results for input(s): VITAMINB12, FOLATE, FERRITIN, TIBC, IRON, RETICCTPCT in the last 72 hours. Sepsis Labs: Recent Labs  Lab 10/31/18 0025  LATICACIDVEN 1.6    Recent Results (from the past 240 hour(s))  SARS CORONAVIRUS 2 (TAT 6-24 HRS) Nasopharyngeal Nasopharyngeal Swab     Status: None   Collection Time: 10/31/18  1:25 AM   Specimen: Nasopharyngeal Swab  Result Value Ref Hayes Status   SARS Coronavirus 2 NEGATIVE NEGATIVE Final    Comment: (NOTE) SARS-CoV-2 target nucleic acids are NOT DETECTED. The SARS-CoV-2 RNA is generally detectable in upper and lower respiratory specimens during the acute phase of infection. Negative results do not preclude SARS-CoV-2 infection, do not rule out co-infections with other pathogens, and should not be used as the sole basis for treatment or other patient management decisions. Negative results must be combined with clinical observations, patient history, and epidemiological information. The expected result is Negative. Fact Sheet for Patients: SugarRoll.be Fact Sheet for Healthcare Providers:  https://www.woods-mathews.com/ This test is not yet approved or cleared by the [place] and  has been authorized for detection and/or diagnosis of SARS-CoV-2 by FDA under an Emergency Use Authorization (EUA). This EUA will remain  in effect (meaning this test can be used) for the duration of the COVID-19 declaration under Section 56 4(b)(1) of the Act, 21 U.S.C. section 360bbb-3(b)(1), unless the authorization is terminated or revoked sooner. Performed at [facility], [place] [address] [place], [place] [address]   Urine culture     Status: None   Collection Time: 10/31/18  2:11 AM   Specimen: Urine, Random  Result Value Ref Hayes Status   Specimen Description URINE, RANDOM  Final   Special Requests NONE  Final   Culture   Final    NO GROWTH Performed at [facility], [place] [address], [place], [place] [address]    Report Status 10/31/2018 FINAL  Final         Radiology Studies: No results found.      Scheduled Meds: . amLODipine  10 mg Oral Daily  . carvedilol  3.125 mg Oral BID WC  . cloNIDine  0.3 mg Transdermal Weekly  . folic acid  1 mg Oral Daily  . lactulose  20 g Oral TID  . levothyroxine  50 mcg Oral Q0600  . pantoprazole  40 mg Oral Daily  . thiamine  100 mg Oral Daily   Continuous Infusions:   LOS: 5 days    Time spent: 35 mins.More than 50% of that time was spent in counseling and/or coordination of care.      Shelly Coss, MD [facility] Pager [phone]  If 7PM-7AM, please contact night-coverage [email or web address] Password [email or web address] 11/05/2018, 1:15 PM

## 2018-11-05 NOTE — TOC Transition Note (Signed)
Transition of Care [facility]) - CM/SW Discharge Note 10/06/18 - Discharged to [facility] via ambulance   Patient Details  Name: Bruce Hayes MRN: [id number] Date of Birth: 01-14-1946  [facility] CM/SW Contact:  Sable Feil, LCSW Phone Number: 11/05/2018, 3:24 PM   Clinical Narrative: Insurance auth received and patient ready for discharge today, and will be transported by ambulance. [name] contacted his sister regarding his discharge. Discharge clinicals transmitted to facility.   Final next level of care: Skilled Nursing Facility Barriers to Discharge: No Barriers Identified   Patient Goals and CMS Choice Patient states their goals for this hospitalization and ongoing recovery are:: Patient agreeable to rehab for strenghtening because he lives alone, then home CMS Medicare.gov Compare Post Acute Care list provided to:: Patient Choice offered to / list presented to : Patient  Discharge Placement PASRR number recieved: 11/02/18            Patient [facility] bed at: [facility] Patient to be transferred to facility by: Ambulance Name of family member notified: Janalyn Harder - [phone] Patient and family notified of of transfer: 11/05/18  Discharge Plan and Services In-house Referral: Clinical Social Work Discharge Planning Services: [employer] Consult Post Acute Care Choice: [place]                             Social Determinants of Health (SDOH) Interventions  [place] SDOH interventions needed.   Readmission Risk Interventions Readmission Risk Prevention Plan 11/02/2018  Transportation Screening Complete  PCP or Specialist Appt within 3-5 Days Not Complete  Not Complete comments plan for SNF  HRI or [place] Not Complete  HRI or Home Care Consult comments plan for SNF  Social Work Consult for [place] Planning/Counseling Complete  Palliative Care Screening Not Applicable  Medication Review (RN Care  Manager) Complete  Some recent data might be hidden

## 2018-11-05 NOTE — TOC Progression Note (Signed)
Transition of Care [facility]) - Progression Note    Patient Details  Name: Bruce Hayes MRN: [id number] Date of Birth: March 04, 1945  Transition of Care [facility]) CM/SW Contact  [name], LCSW Phone Number: 11/05/2018, 10:36 AM  Clinical Narrative:   CSW talked this morning with Rosario Adie, admissions director at [facility] and they can take patient and she will initiate insurance auth with [facility] Medicare.   Visited with patient and he requested that his sister [name] be contacted. Call made to sister [phone]) regarding discharge plan. Ms. Amalia Hailey expressed concern regarding not having talked with a doctor regarding her brother medical condition. She reported that it's just she and [name], as he is the only family she has left. MD text-paged and asked to contact sister.      Expected Discharge Plan: [place] Barriers to Discharge: Continued Medical Work up  Expected Discharge Plan and Services Expected Discharge Plan: [place] In-house Referral: Clinical Social Work Discharge Planning Services: CM Consult Post Acute Care Choice: [place] Living arrangements for the past 2 months: Apartment                                    Social Determinants of Health (SDOH) Interventions  No SDOH interventions needed at this time.  Readmission Risk Interventions Readmission Risk Prevention Plan 11/02/2018  Transportation Screening Complete  PCP or Specialist Appt within 3-5 Days Not Complete  Not Complete comments plan for SNF  HRI or [place] Not Complete  HRI or Home Care Consult comments plan for SNF  Social Work Consult for [place] Planning/Counseling Complete  Palliative Care Screening Not Applicable  Medication Review (RN Care Manager) Complete  Some recent data might be hidden

## 2018-11-05 NOTE — Discharge Summary (Signed)
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  CT head was unremarkable.  MRI of the brain was concerning for hepatic encephalopathy.  CK level,  Ammonia was elevated on presentation along with liver enzymes.  He was started on lactulose.  Currently his mental status is at baseline.  PT/OT evaluated him and recommended skilled nursing facility on discharge.  Patient is hemodynamically stable for discharge to skilled nursing facility .  Following problems were addressed during his hospitalization:  Acute metabolic encephalopathy: Secondary to  hepatic encephalopathy.  MRI suggestive of this.  Ammonia level found to elevated.  Mental status is currently at baseline.  Continue lactulose.  AKI on CKD stage III/rhabdomyolysis: Last creatinine was 1.74 in 2018.  Presented with creatinine in the range of 2.  Creatinine has slightly improved with IV fluids .  Lisinopril, chlorthalidone on hold. CK elevated on presentation most likely secondary to fall.  Improved.  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  Chronic alcohol abuse: Counseled cessation.  Drinks beer/vodka.  Continue thiamine and folic acid.  Hypertensive urgency: Continue current meds.  BP has improved.  Hypothyroidism: Continue Synthyroid  Substance abuse/cocaine abuse: Counseled for  cessation  Thrombocytopenia: Associated with chronic alcohol abuse.  Currently stable.  Debility/deconditioning/fall: Evaluated by PT/OT and recommended skilled nursing facility on discharge.  Discharge Diagnoses:  Principal Problem:   Acute metabolic encephalopathy Active Problems:   OBESITY   TOBACCO ABUSE   Essential hypertension   CKD (chronic kidney disease), stage III   Cocaine abuse ([place])   Polysubstance abuse ([place])   Hypertensive urgency   Other cirrhosis of liver (HCC)   Portal hypertensive gastropathy (HCC)   Hepatic encephalopathy (HCC)    Discharge Instructions  Discharge Instructions    Diet - low sodium heart healthy   Complete by: As directed    Discharge instructions   Complete by: As directed    1)Please stop alcohol consumption. 2)Check CBC,BMP and ammonia in a  Week. 3)Take your medicines as instructed.   Increase activity slowly   Complete by: As directed      Allergies as of 11/05/2018      Reactions   Tylenol [acetaminophen] Other (See Comments)   Liver condition      Medication List    STOP taking these medications   bismuth-metronidazole-tetracycline 140-125-125 MG capsule Commonly known as: Pylera   chlorthalidone 25 MG tablet Commonly known as: HYGROTON   HYDROcodone-acetaminophen 5-325 MG tablet Commonly known as: NORCO/VICODIN   lisinopril 5 MG tablet Commonly known as: ZESTRIL   Na Sulfate-K Sulfate-Mg Sulf 17.5-3.13-1.6 GM/177ML Soln     TAKE these medications   amLODipine 10 MG tablet Commonly known as: NORVASC Take 1 tablet (10 mg total) by mouth daily.   carvedilol 3.125 MG tablet Commonly known as: COREG Take 1 tablet (3.125 mg total) by mouth 2 (two) times daily with a meal.   cloNIDine 0.3 mg/24hr patch Commonly known as: CATAPRES - Dosed in mg/24 hr Place 1 patch (0.3  mg total) onto the skin once a week. Start taking on: November 08, 1538   folic acid 1 MG tablet Commonly known as: FOLVITE Take 1 tablet  (1 mg total) by mouth daily. Start taking on: November 06, 2018   lactulose 10 GM/15ML solution Commonly known as: CHRONULAC Take 30 mLs (20 g total) by mouth 2 (two) times daily.   levothyroxine 50 MCG tablet Commonly known as: SYNTHROID Take 50 mcg by mouth daily before breakfast.   omeprazole 20 MG capsule Commonly known as: PRILOSEC TAKE ONE CAPSULE BY MOUTH TWICE DAILY BEFORE A MEAL FOR 14 DAYS What changed: See the new instructions.   thiamine 100 MG tablet Take 1 tablet (100 mg total) by mouth daily. Start taking on: November 06, 2018      Contact information for after-discharge care    [facility] .   Service: Skilled Nursing Contact information: [place] [address] [phone]             Allergies  Allergen Reactions  . Tylenol [Acetaminophen] Other (See Comments)    Liver condition    Consultations:  None   Procedures/Studies: Dg Chest 2 View  Result Date: 10/30/2018 CLINICAL DATA:  Altered mental status EXAM: CHEST - 2 VIEW COMPARISON:  Radiograph 04/05/2016 FINDINGS: No consolidation, features of edema, pneumothorax, or effusion. Pulmonary vascularity is normally distributed. The aorta is calcified. The remaining cardiomediastinal contours are unremarkable. No acute osseous or soft tissue abnormality. Degenerative changes are present in the imaged spine and shoulders. IMPRESSION: No acute cardiopulmonary abnormality. Aortic Atherosclerosis (ICD10-I70.0). Electronically Signed   By: Lovena Le M.D.   On: 10/30/2018 23:04   Dg Pelvis 1-2 Views  Result Date: 10/31/2018 CLINICAL DATA:  Pain EXAM: PELVIS - 1-2 VIEW COMPARISON:  None. FINDINGS: There is no evidence of pelvic fracture or dislocation. There is mild symmetric narrowing of each hip joint. No erosive change. There are rounded metallic foreign bodies in the medial right upper thigh region. IMPRESSION: Mild symmetric narrowing of each hip  joint. No fracture or dislocation. Electronically Signed   By: Lowella Grip III M.D.   On: 10/31/2018 07:48   Ct Head Wo Contrast  Result Date: 10/30/2018 CLINICAL DATA:  Altered mental status (AMS), unclear cause. EXAM: CT HEAD WITHOUT CONTRAST TECHNIQUE: Contiguous axial images were obtained from the base of the skull through the vertex without intravenous contrast. COMPARISON:  Brain MRI 08/28/2016, head CT 08/27/2016 FINDINGS: Brain: The examination is motion degraded. No evidence of acute intracranial hemorrhage. No demarcated cortical infarction is identified. No evidence of intracranial mass. No midline shift or extra-axial fluid collection. Minimal scattered hypoattenuation within the cerebral white matter is nonspecific, but consistent with chronic small vessel ischemic disease. Cerebral volume is normal for age. Partially empty sella turcica. Vascular: No hyperdense vessel. Atherosclerotic calcification of the carotid artery siphons. Skull: No calvarial fracture. Sinuses/Orbits: Visualized orbits demonstrate no acute abnormality. Chronic fracture deformity of the right orbital floor. No significant paranasal sinus disease or mastoid effusion Other: Metallic foreign body within the right maxillofacial soft tissues. This was present on prior head CT examination. IMPRESSION: 1. Motion degraded examination with no CT evidence of acute intracranial abnormality. 2. Mild chronic small vessel ischemic disease. Electronically Signed   By: Kellie Simmering   On: 10/30/2018 23:03   Mr Brain Wo Contrast  Result Date: 10/31/2018 CLINICAL DATA:  73 year old male with unexplained altered mental status. History of cirrhosis. EXAM: MRI HEAD  WITHOUT CONTRAST TECHNIQUE: Multiplanar, multiecho pulse sequences of the brain and surrounding structures were obtained without intravenous contrast. COMPARISON:  Head CT 10/30/2018. Brain MRI 08/28/2016. FINDINGS: Brain: No restricted diffusion to suggest acute infarction. No  midline shift, mass effect, evidence of mass lesion, ventriculomegaly, extra-axial collection or acute intracranial hemorrhage. Cervicomedullary junction and pituitary are within normal limits. Patchy and indistinct bilateral cerebral white matter T2 and FLAIR hyperintensity appears stable since 2018, mild to moderate for age. Similar stable patchy T2 hyperintensity in the pons. No cortical encephalomalacia or chronic cerebral blood products identified. There does appear to be intrinsic increased T1 signal in the globus pallidus which is new or increased. The other deep gray nuclei and cerebellum remain normal. Vascular: Major intracranial vascular flow voids are stable. Skull and upper cervical spine: Negative visible cervical spine. Visualized bone marrow signal is within normal limits. Sinuses/Orbits: Negative. Other: Mastoids remain clear. Visible internal auditory structures appear normal. Scalp and face soft tissues appear negative. IMPRESSION: 1. New or increased intrinsic T1 signal in the globus pallidus since 2018. This can be seen as a complication of hepatic insufficiency/cirrhosis, recommend correlation with serum ammonia levels. 2. No other acute intracranial abnormality. Stable mild to moderate for age nonspecific cerebral white matter signal changes, most commonly due to chronic small vessel disease. Electronically Signed   By: Genevie Ann M.D.   On: 10/31/2018 01:59   Mr Abdomen W Wo Contrast  Result Date: 10/18/2018 CLINICAL DATA:  Elevated AFP and cirrhosis. EXAM: MRI ABDOMEN WITHOUT AND WITH CONTRAST TECHNIQUE: Multiplanar multisequence MR imaging of the abdomen was performed both before and after the administration of intravenous contrast. CONTRAST:  41mL MULTIHANCE GADOBENATE DIMEGLUMINE 529 MG/ML IV SOLN COMPARISON:  06/28/2018 abdominal ultrasound. Most recent MRI of 10/20/2008 FINDINGS: Lower chest: Right base presumed scarring.  Normal heart size. Hepatobiliary: Moderate cirrhosis. Multiple  arterial phase foci of hyperenhancement, the largest is in the subcapsular right hepatic lobe at 1.3 cm on 40/18. None of these areas demonstrate delayed washout or other precontrast signal characteristics to suggest hepatocellular carcinoma. Gallbladder wall thickening is mild at 5 mm and nonspecific in the setting of cirrhosis. No stone, pericholecystic fluid, or biliary duct dilatation. Pancreas: A pancreatic tail 8 mm T2 hyperintense simple appearing lesion on 18/10 is new since the remote MRI. No main pancreatic duct dilatation or acute inflammation. Spleen:  Splenomegaly, maximally 14.1 cm. Adrenals/Urinary Tract: Normal adrenal glands. Bilateral renal cysts, largest upper pole left kidney at 1.6 cm. Stomach/Bowel: Normal stomach and abdominal bowel loops. Vascular/Lymphatic: Aortic atherosclerosis. Patent portal and hepatic veins. No abdominal adenopathy. Other:  No ascites. Musculoskeletal: No acute osseous abnormality. IMPRESSION: 1. Cirrhosis and splenomegaly. No evidence of hepatocellular carcinoma. Scattered small foci of arterial hyperenhancement are likely perfusion anomalies. 2.  Aortic Atherosclerosis (ICD10-I70.0). 3. New simple cystic lesion within the pancreatic tail is likely a pseudocyst. Indolent cystic neoplasm could look similar. Recommend attention on follow-up. If the patient does not receive MRI follow-ups for cirrhosis, recommend follow-up with pre and post contrast abdominal MRI at 1 year. This recommendation follows ACR consensus guidelines: Management of Incidental Pancreatic Cysts: A White Paper of the ACR Incidental Findings Committee. [place] 2956;21:308-657. Electronically Signed   By: Abigail Miyamoto M.D.   On: 10/18/2018 13:35       Subjective: Patient seen and examined at bedside this morning.  Hemodynamically stable for discharge.  Discharge Exam: Vitals:   11/05/18 0445 11/05/18 1000  BP: 112/70 137/64  Pulse: 68 62  Resp: 15 16  Temp: 98.3 F (36.8 C)  (!) 97.5 F (36.4 C)  SpO2: 100% 100%   Vitals:   11/04/18 2005 11/05/18 0445 11/05/18 0500 11/05/18 1000  BP: (!) 106/55 112/70  137/64  Pulse: 64 68  62  Resp: 14 15  16   Temp: 98.1 F (36.7 C) 98.3 F (36.8 C)  (!) 97.5 F (36.4 C)  TempSrc: Oral Oral  Oral  SpO2: 100% 100%  100%  Weight: 99.4 kg  99.5 kg   Height:        General: Pt is alert, awake, not in acute distress Cardiovascular: RRR, S1/S2 +, no rubs, no gallops Respiratory: CTA bilaterally, no wheezing, no rhonchi Abdominal: Soft, NT, ND, bowel sounds + Extremities: no edema, no cyanosis    The results of significant diagnostics from this hospitalization (including imaging, microbiology, ancillary and laboratory) are listed below for reference.     Microbiology: Recent Results (from the past 240 hour(s))  SARS CORONAVIRUS 2 (TAT 6-24 HRS) Nasopharyngeal Nasopharyngeal Swab     Status: None   Collection Time: 10/31/18  1:25 AM   Specimen: Nasopharyngeal Swab  Result Value Ref Range Status   SARS Coronavirus 2 NEGATIVE NEGATIVE Final    Comment: (NOTE) SARS-CoV-2 target nucleic acids are NOT DETECTED. The SARS-CoV-2 RNA is generally detectable in upper and lower respiratory specimens during the acute phase of infection. Negative results do not preclude SARS-CoV-2 infection, do not rule out co-infections with other pathogens, and should not be used as the sole basis for treatment or other patient management decisions. Negative results must be combined with clinical observations, patient history, and epidemiological information. The expected result is Negative. Fact Sheet for Patients: SugarRoll.be Fact Sheet for Healthcare Providers: https://www.woods-mathews.com/ This test is not yet approved or cleared by the [place] FDA and  has been authorized for detection and/or diagnosis of SARS-CoV-2 by FDA under an Emergency Use Authorization (EUA). This EUA will  remain  in effect (meaning this test can be used) for the duration of the COVID-19 declaration under Section 56 4(b)(1) of the Act, 21 U.S.C. section 360bbb-3(b)(1), unless the authorization is terminated or revoked sooner. Performed at [facility], [place] [address], [place], [place] [address]   Urine culture     Status: None   Collection Time: 10/31/18  2:11 AM   Specimen: Urine, Random  Result Value Ref Range Status   Specimen Description URINE, RANDOM  Final   Special Requests NONE  Final   Culture   Final    NO GROWTH Performed at [facility], [place] [address], [place], [place] [address]    Report Status 10/31/2018 FINAL  Final     Labs: BNP (last 3 results) No results for input(s): BNP in the last 8760 hours. Basic Metabolic Panel: Recent Labs  Lab 10/30/18 2230 10/31/18 0822 11/01/18 0405 11/03/18 1109 11/05/18 0412  NA 146* 145 144 141 142  K 4.2 4.1 3.9 4.3 4.2  CL 120* 119* 120* 119* 116*  CO2 16* 16* 16* 16* 17*  GLUCOSE 90 92 98 108* 93  BUN 29* 31* 27* 27* 29*  CREATININE 2.61* 2.52* 2.44* 2.08* 1.87*  CALCIUM 8.3* 8.4* 8.0* 7.7* 8.0*  MG  --   --   --  2.1  --   PHOS  --   --   --  3.8  --    Liver Function Tests: Recent Labs  Lab 10/30/18 2230 11/03/18 1109  AST 128*  --  ALT 56*  --   ALKPHOS 154*  --   BILITOT 1.4*  --   PROT 7.6  --   ALBUMIN 2.3* 1.8*   No results for input(s): LIPASE, AMYLASE in the last 168 hours. Recent Labs  Lab 10/30/18 2230  AMMONIA 82*   CBC: Recent Labs  Lab 10/30/18 2230 11/03/18 1109 11/05/18 0412  WBC 6.3 4.5 4.7  NEUTROABS 3.4 1.6* 1.7  HGB 11.5* 10.2* 9.9*  HCT 32.4* 28.7* 28.3*  MCV 83.9 83.7 83.2  PLT 99* 66* 75*   Cardiac Enzymes: Recent Labs  Lab 10/31/18 0323 11/01/18 0405  CKTOTAL 3,250* 2,270*   BNP: Invalid input(s): POCBNP CBG: Recent Labs  Lab 11/04/18 1133 11/04/18 1632 11/04/18 2336 11/05/18 0549 11/05/18 1137  GLUCAP 104* 130* 136* 101* 107*    D-Dimer No results for input(s): DDIMER in the last 72 hours. Hgb A1c No results for input(s): HGBA1C in the last 72 hours. Lipid Profile No results for input(s): CHOL, HDL, LDLCALC, TRIG, CHOLHDL, LDLDIRECT in the last 72 hours. Thyroid function studies No results for input(s): TSH, T4TOTAL, T3FREE, THYROIDAB in the last 72 hours.  Invalid input(s): FREET3 Anemia work up No results for input(s): VITAMINB12, FOLATE, FERRITIN, TIBC, IRON, RETICCTPCT in the last 72 hours. Urinalysis    Component Value Date/Time   COLORURINE YELLOW 10/31/2018 0211   APPEARANCEUR CLEAR 10/31/2018 0211   LABSPEC 1.015 10/31/2018 0211   PHURINE 5.0 10/31/2018 0211   GLUCOSEU NEGATIVE 10/31/2018 0211   HGBUR MODERATE (A) 10/31/2018 0211   BILIRUBINUR NEGATIVE 10/31/2018 0211   KETONESUR NEGATIVE 10/31/2018 0211   PROTEINUR 100 (A) 10/31/2018 0211   UROBILINOGEN 4.0 (H) 03/31/2014 0920   NITRITE NEGATIVE 10/31/2018 0211   LEUKOCYTESUR NEGATIVE 10/31/2018 0211   Sepsis Labs Invalid input(s): PROCALCITONIN,  WBC,  LACTICIDVEN Microbiology Recent Results (from the past 240 hour(s))  SARS CORONAVIRUS 2 (TAT 6-24 HRS) Nasopharyngeal Nasopharyngeal Swab     Status: None   Collection Time: 10/31/18  1:25 AM   Specimen: Nasopharyngeal Swab  Result Value Ref Range Status   SARS Coronavirus 2 NEGATIVE NEGATIVE Final    Comment: (NOTE) SARS-CoV-2 target nucleic acids are NOT DETECTED. The SARS-CoV-2 RNA is generally detectable in upper and lower respiratory specimens during the acute phase of infection. Negative results do not preclude SARS-CoV-2 infection, do not rule out co-infections with other pathogens, and should not be used as the sole basis for treatment or other patient management decisions. Negative results must be combined with clinical observations, patient history, and epidemiological information. The expected result is Negative. Fact Sheet for  Patients: SugarRoll.be Fact Sheet for Healthcare Providers: https://www.woods-mathews.com/ This test is not yet approved or cleared by the [place] FDA and  has been authorized for detection and/or diagnosis of SARS-CoV-2 by FDA under an Emergency Use Authorization (EUA). This EUA will remain  in effect (meaning this test can be used) for the duration of the COVID-19 declaration under Section 56 4(b)(1) of the Act, 21 U.S.C. section 360bbb-3(b)(1), unless the authorization is terminated or revoked sooner. Performed at [facility], [place] [address] [place], [place] [address]   Urine culture     Status: None   Collection Time: 10/31/18  2:11 AM   Specimen: Urine, Random  Result Value Ref Range Status   Specimen Description URINE, RANDOM  Final   Special Requests NONE  Final   Culture   Final    NO GROWTH Performed at [facility], [place]  [address], [place], [place] [address]    Report Status 10/31/2018 FINAL  Final    Please note: You were cared for by a hospitalist during your hospital stay. Once you are discharged, your primary care physician will handle any further medical issues. Please note that NO REFILLS for any discharge medications will be authorized once you are discharged, as it is imperative that you return to your primary care physician (or establish a relationship with a primary care physician if you do not have one) for your post hospital discharge needs so that they can reassess your need for medications and monitor your lab values.    Time coordinating discharge: 40 minutes  SIGNED:   Shelly Coss, MD  [facility] 11/05/2018, 2:10 PM Pager [phone]  If 7PM-7AM, please contact night-coverage [email or web address] Password [email or web address]

## 2018-12-29 ENCOUNTER — Encounter (HOSPITAL_COMMUNITY): Payer: Self-pay | Admitting: Emergency Medicine

## 2018-12-29 ENCOUNTER — Inpatient Hospital Stay (HOSPITAL_COMMUNITY)
Admission: EM | Admit: 2018-12-29 | Discharge: 2019-01-02 | DRG: 683 | Disposition: A | Discharge status: Skilled Nursing Facility | Payer: Medicare Other | Attending: Internal Medicine | Admitting: Internal Medicine

## 2018-12-29 ENCOUNTER — Other Ambulatory Visit: Payer: Self-pay

## 2018-12-29 ENCOUNTER — Emergency Department (HOSPITAL_COMMUNITY): Payer: Medicare Other

## 2018-12-29 DIAGNOSIS — Z6826 Body mass index (BMI) 26.0-26.9, adult: Secondary | ICD-10-CM | POA: Diagnosis not present

## 2018-12-29 DIAGNOSIS — Z79899 Other long term (current) drug therapy: Secondary | ICD-10-CM

## 2018-12-29 DIAGNOSIS — K7460 Unspecified cirrhosis of liver: Secondary | ICD-10-CM | POA: Diagnosis present

## 2018-12-29 DIAGNOSIS — N179 Acute kidney failure, unspecified: Secondary | ICD-10-CM | POA: Diagnosis present

## 2018-12-29 DIAGNOSIS — E46 Unspecified protein-calorie malnutrition: Secondary | ICD-10-CM | POA: Diagnosis present

## 2018-12-29 DIAGNOSIS — E039 Hypothyroidism, unspecified: Secondary | ICD-10-CM | POA: Diagnosis present

## 2018-12-29 DIAGNOSIS — F329 Major depressive disorder, single episode, unspecified: Secondary | ICD-10-CM | POA: Diagnosis present

## 2018-12-29 DIAGNOSIS — F17210 Nicotine dependence, cigarettes, uncomplicated: Secondary | ICD-10-CM | POA: Diagnosis present

## 2018-12-29 DIAGNOSIS — I129 Hypertensive chronic kidney disease with stage 1 through stage 4 chronic kidney disease, or unspecified chronic kidney disease: Secondary | ICD-10-CM | POA: Diagnosis present

## 2018-12-29 DIAGNOSIS — N183 Chronic kidney disease, stage 3 unspecified: Secondary | ICD-10-CM | POA: Diagnosis present

## 2018-12-29 DIAGNOSIS — E875 Hyperkalemia: Secondary | ICD-10-CM | POA: Diagnosis present

## 2018-12-29 DIAGNOSIS — I2510 Atherosclerotic heart disease of native coronary artery without angina pectoris: Secondary | ICD-10-CM | POA: Diagnosis present

## 2018-12-29 DIAGNOSIS — N1831 Chronic kidney disease, stage 3a: Secondary | ICD-10-CM

## 2018-12-29 DIAGNOSIS — I160 Hypertensive urgency: Secondary | ICD-10-CM | POA: Diagnosis present

## 2018-12-29 DIAGNOSIS — M109 Gout, unspecified: Secondary | ICD-10-CM | POA: Diagnosis present

## 2018-12-29 DIAGNOSIS — R609 Edema, unspecified: Secondary | ICD-10-CM

## 2018-12-29 DIAGNOSIS — Z79890 Hormone replacement therapy (postmenopausal): Secondary | ICD-10-CM

## 2018-12-29 DIAGNOSIS — B1920 Unspecified viral hepatitis C without hepatic coma: Secondary | ICD-10-CM | POA: Diagnosis present

## 2018-12-29 DIAGNOSIS — E785 Hyperlipidemia, unspecified: Secondary | ICD-10-CM | POA: Diagnosis present

## 2018-12-29 DIAGNOSIS — E873 Alkalosis: Secondary | ICD-10-CM | POA: Diagnosis present

## 2018-12-29 DIAGNOSIS — Z20828 Contact with and (suspected) exposure to other viral communicable diseases: Secondary | ICD-10-CM | POA: Diagnosis present

## 2018-12-29 DIAGNOSIS — N189 Chronic kidney disease, unspecified: Secondary | ICD-10-CM

## 2018-12-29 LAB — CBC WITH DIFFERENTIAL/PLATELET
Abs Immature Granulocytes: 0.05 10*3/uL (ref 0.00–0.07)
Basophils Absolute: 0 10*3/uL (ref 0.0–0.1)
Basophils Relative: 0 %
Eosinophils Absolute: 0 10*3/uL (ref 0.0–0.5)
Eosinophils Relative: 0 %
HCT: 33.6 % — ABNORMAL LOW (ref 39.0–52.0)
Hemoglobin: 11.8 g/dL — ABNORMAL LOW (ref 13.0–17.0)
Immature Granulocytes: 1 %
Lymphocytes Relative: 18 %
Lymphs Abs: 1.9 10*3/uL (ref 0.7–4.0)
MCH: 28.9 pg (ref 26.0–34.0)
MCHC: 35.1 g/dL (ref 30.0–36.0)
MCV: 82.4 fL (ref 80.0–100.0)
Monocytes Absolute: 1.9 10*3/uL — ABNORMAL HIGH (ref 0.1–1.0)
Monocytes Relative: 18 %
Neutro Abs: 6.6 10*3/uL (ref 1.7–7.7)
Neutrophils Relative %: 63 %
Platelets: 225 10*3/uL (ref 150–400)
RBC: 4.08 MIL/uL — ABNORMAL LOW (ref 4.22–5.81)
RDW: 14.5 % (ref 11.5–15.5)
WBC: 10.4 10*3/uL (ref 4.0–10.5)
nRBC: 0 % (ref 0.0–0.2)

## 2018-12-29 LAB — BASIC METABOLIC PANEL
Anion gap: 8 (ref 5–15)
BUN: 87 mg/dL — ABNORMAL HIGH (ref 8–23)
CO2: 15 mmol/L — ABNORMAL LOW (ref 22–32)
Calcium: 7.9 mg/dL — ABNORMAL LOW (ref 8.9–10.3)
Chloride: 117 mmol/L — ABNORMAL HIGH (ref 98–111)
Creatinine, Ser: 3.25 mg/dL — ABNORMAL HIGH (ref 0.61–1.24)
GFR calc Af Amer: 21 mL/min — ABNORMAL LOW (ref >60)
GFR calc non Af Amer: 18 mL/min — ABNORMAL LOW (ref >60)
Glucose, Bld: 100 mg/dL — ABNORMAL HIGH (ref 70–99)
Potassium: 6.7 mmol/L (ref 3.5–5.1)
Sodium: 140 mmol/L (ref 135–145)

## 2018-12-29 LAB — URINALYSIS, ROUTINE W REFLEX MICROSCOPIC
Bacteria, UA: NONE SEEN
Bilirubin Urine: NEGATIVE
Glucose, UA: NEGATIVE mg/dL
Hgb urine dipstick: NEGATIVE
Ketones, ur: NEGATIVE mg/dL
Nitrite: NEGATIVE
Protein, ur: 30 mg/dL — AB
Specific Gravity, Urine: 1.016 (ref 1.005–1.030)
pH: 5 (ref 5.0–8.0)

## 2018-12-29 LAB — CREATININE, URINE, RANDOM: Creatinine, Urine: 118.82 mg/dL

## 2018-12-29 LAB — CBG MONITORING, ED
Glucose-Capillary: 88 mg/dL (ref 70–99)
Glucose-Capillary: 100 mg/dL — ABNORMAL HIGH (ref 70–99)

## 2018-12-29 LAB — POTASSIUM: Potassium: 6.2 mmol/L — ABNORMAL HIGH (ref 3.5–5.1)

## 2018-12-29 LAB — SODIUM, URINE, RANDOM: Sodium, Ur: 22 mmol/L

## 2018-12-29 MED ORDER — SODIUM BICARBONATE 8.4 % IV SOLN
INTRAVENOUS | Status: DC
  Administered 2018-12-29 – 2018-12-30 (×3): via INTRAVENOUS
  Filled 2018-12-29 (×2): qty 1000

## 2018-12-29 MED ORDER — ONDANSETRON HCL 4 MG PO TABS: 4.0000 mg | ORAL_TABLET | Freq: Four times a day (QID) | ORAL | Status: DC | PRN

## 2018-12-29 MED ORDER — INSULIN ASPART 100 UNIT/ML IV SOLN
5.0000 [IU] | Freq: Once | INTRAVENOUS | Status: AC
  Administered 2018-12-29: 18:00:00 5 [IU] via INTRAVENOUS
  Filled 2018-12-29: qty 0.05

## 2018-12-29 MED ORDER — HYDROMORPHONE HCL 1 MG/ML IJ SOLN
1.0000 mg | Freq: Once | INTRAMUSCULAR | Status: AC
  Administered 2018-12-29: 18:00:00 1 mg via INTRAVENOUS
  Filled 2018-12-29: qty 1

## 2018-12-29 MED ORDER — DEXTROSE 50 % IV SOLN
1.0000 | Freq: Once | INTRAVENOUS | Status: AC
  Administered 2018-12-29: 18:00:00 50 mL via INTRAVENOUS
  Filled 2018-12-29: qty 50

## 2018-12-29 MED ORDER — ALBUTEROL SULFATE HFA 108 (90 BASE) MCG/ACT IN AERS
2.0000 | INHALATION_SPRAY | Freq: Once | RESPIRATORY_TRACT | Status: AC
  Administered 2018-12-29: 2 via RESPIRATORY_TRACT
  Filled 2018-12-29: qty 6.7

## 2018-12-29 MED ORDER — CARVEDILOL 3.125 MG PO TABS
3.1250 mg | ORAL_TABLET | Freq: Two times a day (BID) | ORAL | Status: DC
  Administered 2018-12-30 – 2019-01-02 (×7): 3.125 mg via ORAL
  Filled 2018-12-29 (×7): qty 1

## 2018-12-29 MED ORDER — SODIUM POLYSTYRENE SULFONATE 15 GM/60ML PO SUSP
30.0000 g | Freq: Four times a day (QID) | ORAL | Status: DC
  Administered 2018-12-29: 20:00:00 30 g via ORAL
  Filled 2018-12-29 (×3): qty 120

## 2018-12-29 MED ORDER — FOLIC ACID 1 MG PO TABS
1.0000 mg | ORAL_TABLET | Freq: Every day | ORAL | Status: DC
  Administered 2018-12-30 – 2019-01-01 (×3): 1 mg via ORAL
  Filled 2018-12-29 (×4): qty 1

## 2018-12-29 MED ORDER — CALCIUM GLUCONATE-NACL 1-0.675 GM/50ML-% IV SOLN
1.0000 g | Freq: Once | INTRAVENOUS | Status: AC
  Administered 2018-12-29: 19:00:00 1000 mg via INTRAVENOUS
  Filled 2018-12-29: qty 50

## 2018-12-29 MED ORDER — LEVOTHYROXINE SODIUM 50 MCG PO TABS
50.0000 ug | ORAL_TABLET | Freq: Every day | ORAL | Status: DC
  Administered 2018-12-30 – 2019-01-02 (×4): 50 ug via ORAL
  Filled 2018-12-29 (×4): qty 1

## 2018-12-29 MED ORDER — PANTOPRAZOLE SODIUM 40 MG PO TBEC
40.0000 mg | DELAYED_RELEASE_TABLET | Freq: Every day | ORAL | Status: DC
  Administered 2018-12-30 – 2019-01-01 (×3): 40 mg via ORAL
  Filled 2018-12-29 (×4): qty 1

## 2018-12-29 MED ORDER — VITAMIN B-1 100 MG PO TABS
100.0000 mg | ORAL_TABLET | Freq: Every day | ORAL | Status: DC
  Administered 2018-12-30 – 2019-01-01 (×3): 100 mg via ORAL
  Filled 2018-12-29 (×4): qty 1

## 2018-12-29 MED ORDER — CLONIDINE HCL 0.3 MG/24HR TD PTWK
0.3000 mg | MEDICATED_PATCH | TRANSDERMAL | Status: DC
  Administered 2018-12-30: 0.3 mg via TRANSDERMAL
  Filled 2018-12-29: qty 1

## 2018-12-29 MED ORDER — AMLODIPINE BESYLATE 10 MG PO TABS
10.0000 mg | ORAL_TABLET | Freq: Every day | ORAL | Status: DC
  Administered 2018-12-30 – 2019-01-01 (×3): 10 mg via ORAL
  Filled 2018-12-29 (×4): qty 1

## 2018-12-29 MED ORDER — HYDRALAZINE HCL 20 MG/ML IJ SOLN
10.0000 mg | INTRAMUSCULAR | Status: DC | PRN
  Administered 2018-12-29 (×2): 10 mg via INTRAVENOUS
  Filled 2018-12-29 (×2): qty 1

## 2018-12-29 MED ORDER — ONDANSETRON HCL 4 MG/2ML IJ SOLN: 4.0000 mg | Freq: Four times a day (QID) | INTRAMUSCULAR | Status: DC | PRN

## 2018-12-29 MED ORDER — CLONIDINE HCL 0.3 MG/24HR TD PTWK: 0.3000 mg | MEDICATED_PATCH | TRANSDERMAL | Status: DC

## 2018-12-29 MED ORDER — HEPARIN SODIUM (PORCINE) 5000 UNIT/ML IJ SOLN
5000.0000 [IU] | Freq: Three times a day (TID) | INTRAMUSCULAR | Status: DC
  Administered 2018-12-29 – 2019-01-02 (×11): 5000 [IU] via SUBCUTANEOUS
  Filled 2018-12-29 (×11): qty 1

## 2018-12-29 NOTE — ED Notes (Signed)
Please update patient's sister at [phone]

## 2018-12-29 NOTE — ED Notes (Signed)
Critical lab, potassium 6.7, no hemolysis

## 2018-12-29 NOTE — ED Notes (Signed)
Patient transported to X-ray via stretcher 
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
## 2018-12-29 NOTE — Progress Notes (Signed)
VASCULAR LAB PRELIMINARY  PRELIMINARY  PRELIMINARY  PRELIMINARY  Left lower extremity venous duplex completed.    Preliminary report:  See CV proc for preliminary results.  Gave Dr. Roslynn Amble results.  Zelphia Glover, RVT 12/29/2018, 6:19 PM

## 2018-12-29 NOTE — ED Triage Notes (Signed)
Arrives via EMS from home, C/C L leg pain, it is hot and swollen, hx of gout. Patient has not gotten out of the bed x2 days because of the pain and has not had any of his home medications.

## 2018-12-30 ENCOUNTER — Inpatient Hospital Stay (HOSPITAL_COMMUNITY): Payer: Medicare Other

## 2018-12-30 DIAGNOSIS — N183 Chronic kidney disease, stage 3 unspecified: Secondary | ICD-10-CM

## 2018-12-30 DIAGNOSIS — N179 Acute kidney failure, unspecified: Principal | ICD-10-CM

## 2018-12-30 LAB — CBC
HCT: 32 % — ABNORMAL LOW (ref 39.0–52.0)
Hemoglobin: 11.1 g/dL — ABNORMAL LOW (ref 13.0–17.0)
MCH: 28.8 pg (ref 26.0–34.0)
MCHC: 34.7 g/dL (ref 30.0–36.0)
MCV: 83.1 fL (ref 80.0–100.0)
Platelets: 198 10*3/uL (ref 150–400)
RBC: 3.85 MIL/uL — ABNORMAL LOW (ref 4.22–5.81)
RDW: 14.6 % (ref 11.5–15.5)
WBC: 10.1 10*3/uL (ref 4.0–10.5)
nRBC: 0 % (ref 0.0–0.2)

## 2018-12-30 LAB — BASIC METABOLIC PANEL
Anion gap: 10 (ref 5–15)
BUN: 78 mg/dL — ABNORMAL HIGH (ref 8–23)
CO2: 17 mmol/L — ABNORMAL LOW (ref 22–32)
Calcium: 7.8 mg/dL — ABNORMAL LOW (ref 8.9–10.3)
Chloride: 117 mmol/L — ABNORMAL HIGH (ref 98–111)
Creatinine, Ser: 2.95 mg/dL — ABNORMAL HIGH (ref 0.61–1.24)
GFR calc Af Amer: 24 mL/min — ABNORMAL LOW (ref >60)
GFR calc non Af Amer: 20 mL/min — ABNORMAL LOW (ref >60)
Glucose, Bld: 110 mg/dL — ABNORMAL HIGH (ref 70–99)
Potassium: 4.8 mmol/L (ref 3.5–5.1)
Sodium: 144 mmol/L (ref 135–145)

## 2018-12-30 LAB — RENAL FUNCTION PANEL
Albumin: 2.2 g/dL — ABNORMAL LOW (ref 3.5–5.0)
Anion gap: 10 (ref 5–15)
BUN: 79 mg/dL — ABNORMAL HIGH (ref 8–23)
CO2: 17 mmol/L — ABNORMAL LOW (ref 22–32)
Calcium: 7.9 mg/dL — ABNORMAL LOW (ref 8.9–10.3)
Chloride: 118 mmol/L — ABNORMAL HIGH (ref 98–111)
Creatinine, Ser: 2.94 mg/dL — ABNORMAL HIGH (ref 0.61–1.24)
GFR calc Af Amer: 24 mL/min — ABNORMAL LOW (ref >60)
GFR calc non Af Amer: 20 mL/min — ABNORMAL LOW (ref >60)
Glucose, Bld: 110 mg/dL — ABNORMAL HIGH (ref 70–99)
Phosphorus: 4.6 mg/dL (ref 2.5–4.6)
Potassium: 4.8 mmol/L (ref 3.5–5.1)
Sodium: 145 mmol/L (ref 135–145)

## 2018-12-30 LAB — CK: Total CK: 601 U/L — ABNORMAL HIGH (ref 49–397)

## 2018-12-30 LAB — BLOOD GAS, ARTERIAL
Acid-base deficit: 1.6 mmol/L (ref 0.0–2.0)
Allens test (pass/fail): POSITIVE — AB
Bicarbonate: 19.9 mmol/L — ABNORMAL LOW (ref 20.0–28.0)
O2 Saturation: 98.1 %
Patient temperature: 98.6
pCO2 arterial: 24.8 mmHg — ABNORMAL LOW (ref 32.0–48.0)
pH, Arterial: 7.515 — ABNORMAL HIGH (ref 7.350–7.450)
pO2, Arterial: 110 mmHg — ABNORMAL HIGH (ref 83.0–108.0)

## 2018-12-30 LAB — HEPATIC FUNCTION PANEL
ALT: 41 U/L (ref 0–44)
AST: 72 U/L — ABNORMAL HIGH (ref 15–41)
Albumin: 2.2 g/dL — ABNORMAL LOW (ref 3.5–5.0)
Alkaline Phosphatase: 110 U/L (ref 38–126)
Bilirubin, Direct: 2.7 mg/dL — ABNORMAL HIGH (ref 0.0–0.2)
Indirect Bilirubin: 1.4 mg/dL — ABNORMAL HIGH (ref 0.3–0.9)
Total Bilirubin: 4.1 mg/dL — ABNORMAL HIGH (ref 0.3–1.2)
Total Protein: 7.3 g/dL (ref 6.5–8.1)

## 2018-12-30 LAB — POTASSIUM: Potassium: 5.6 mmol/L — ABNORMAL HIGH (ref 3.5–5.1)

## 2018-12-30 LAB — AMMONIA: Ammonia: 89 umol/L — ABNORMAL HIGH (ref 9–35)

## 2018-12-30 LAB — SARS CORONAVIRUS 2 (TAT 6-24 HRS): SARS Coronavirus 2: NEGATIVE

## 2018-12-30 LAB — URIC ACID: Uric Acid, Serum: 13.8 mg/dL — ABNORMAL HIGH (ref 3.7–8.6)

## 2018-12-30 MED ORDER — DEXTROSE-NACL 5-0.9 % IV SOLN
INTRAVENOUS | Status: DC
  Administered 2018-12-30: 13:00:00 via INTRAVENOUS

## 2018-12-30 MED ORDER — PREDNISONE 50 MG PO TABS
50.0000 mg | ORAL_TABLET | Freq: Every day | ORAL | Status: DC
  Administered 2018-12-30 – 2019-01-02 (×4): 50 mg via ORAL
  Filled 2018-12-30 (×4): qty 1

## 2018-12-30 MED ORDER — NEPRO/CARBSTEADY PO LIQD
237.0000 mL | ORAL | Status: DC
  Administered 2018-12-30 – 2019-01-01 (×3): 237 mL via ORAL
  Filled 2018-12-30 (×4): qty 237

## 2018-12-30 MED ORDER — LABETALOL HCL 5 MG/ML IV SOLN
10.0000 mg | INTRAVENOUS | Status: DC | PRN
  Administered 2018-12-30 (×2): 10 mg via INTRAVENOUS
  Filled 2018-12-30 (×2): qty 4

## 2018-12-30 MED ORDER — PRO-STAT SUGAR FREE PO LIQD
30.0000 mL | Freq: Three times a day (TID) | ORAL | Status: DC
  Administered 2018-12-30 – 2019-01-01 (×8): 30 mL via ORAL
  Filled 2018-12-30 (×9): qty 30

## 2018-12-30 MED ORDER — FENTANYL CITRATE (PF) 100 MCG/2ML IJ SOLN
25.0000 ug | Freq: Once | INTRAMUSCULAR | Status: AC
  Administered 2018-12-30: 25 ug via INTRAVENOUS
  Filled 2018-12-30: qty 2

## 2018-12-30 MED ORDER — METHYLPREDNISOLONE SODIUM SUCC 40 MG IJ SOLR
40.0000 mg | Freq: Once | INTRAMUSCULAR | Status: AC
  Administered 2018-12-30: 40 mg via INTRAVENOUS
  Filled 2018-12-30: qty 1

## 2018-12-30 MED ORDER — HYDROCODONE-ACETAMINOPHEN 7.5-325 MG PO TABS: 1.0000 | ORAL_TABLET | Freq: Four times a day (QID) | ORAL | Status: DC | PRN

## 2018-12-30 NOTE — NC FL2 (Signed)
[place] MEDICAID FL2 LEVEL OF CARE SCREENING TOOL     IDENTIFICATION  Patient Name: Bruce Hayes Birthdate: 11-06-1945 Sex: male Admission Date (Current Location): 12/29/2018  [facility] and [place] Number:  Herbalist and Address:  [facility],  [place] [address], [place]      Provider Number:    Attending Physician Name and Address:  Shelly Coss, MD  Relative Name and Phone Number:  [name] [phone]    Current Level of Care: Hospital Recommended Level of Care: [place] Prior Approval Number:    Date Approved/Denied:   PASRR Number: [id number]  Discharge Plan: SNF    Current Diagnoses: Patient Active Problem List   Diagnosis Date Noted  . Acute on chronic renal failure ([place]) 12/29/2018  . Acute metabolic encephalopathy 62/03/5595  . Hepatic encephalopathy ([place]) 10/31/2018  . Encounter for colonoscopy due to history of adenomatous colonic polyps   . Benign neoplasm of ascending colon   . Other cirrhosis of liver ([place])   . Gastritis and gastroduodenitis   . Portal hypertensive gastropathy ([place])   . Hypertensive urgency 08/27/2016  . Acute kidney injury superimposed on CKD ([place]) 08/27/2016  . Headache 08/27/2016  . Blurred vision 08/27/2016  . AKI (acute kidney injury) ([place]) 08/27/2016  . Severe alcohol use disorder ([place]) 11/11/2015  . Alcohol use disorder, severe, dependence ([place]) 11/08/2015  . Alcohol abuse 12/07/2013  . Cocaine abuse ([place]) 12/07/2013  . Polysubstance abuse ([place]) 12/07/2013  . Alcohol use disorder, moderate, dependence ([place]) 12/07/2013  . Depression   . 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 CKD (chronic kidney disease), stage III 09/06/2007  . HEPATITIS B CARRIER 07/16/2007  . UNSPECIFIED DISORDER OF LIVER 07/03/2007  . HEPATITIS C 05/22/2007  . HYPERLIPIDEMIA 05/22/2007  . OBESITY 05/22/2007  . THROMBOCYTOPENIA 05/22/2007  . TOBACCO  ABUSE 05/22/2007  . Essential hypertension 05/22/2007  . COCAINE ABUSE, HX OF 05/22/2007    Orientation RESPIRATION BLADDER Height & Weight     Self, Time, Situation  Normal Continent Weight: 85.3 kg Height:  5\' 11"  (180.3 cm)  BEHAVIORAL SYMPTOMS/MOOD NEUROLOGICAL BOWEL NUTRITION STATUS      Continent Diet(Renal diet)  AMBULATORY STATUS COMMUNICATION OF NEEDS Skin   Limited Assist Verbally Normal                       Personal Care Assistance Level of Assistance              Functional Limitations Info  Sight, Hearing, Speech Sight Info: Impaired(eyeglasses) Hearing Info: Adequate Speech Info: Adequate    SPECIAL CARE FACTORS FREQUENCY  PT (By licensed PT), OT (By licensed OT)     PT Frequency: 5x week OT Frequency: 5x week            Contractures Contractures Info: Not present    Additional Factors Info  Code Status, Allergies Code Status Info: Full code Allergies Info: Tylenol           Current Medications (12/30/2018):  This is the current hospital active medication list Current Facility-Administered Medications  Medication Dose Route Frequency Provider Last Rate Last Dose  . amLODipine (NORVASC) tablet 10 mg  10 mg Oral Daily Rise Patience, MD   10 mg at 12/30/18 1034  . carvedilol (COREG) tablet 3.125 mg  3.125 mg Oral BID WC Rise Patience, MD   3.125 mg at 12/30/18 4163  . cloNIDine (CATAPRES -  Dosed in mg/24 hr) patch 0.3 mg  0.3 mg Transdermal Weekly Rise Patience, MD   0.3 mg at 12/30/18 0019  . dextrose 5 %-0.9 % sodium chloride infusion   Intravenous Continuous Rexene Agent, MD 75 mL/hr at 12/30/18 1327    . feeding supplement (NEPRO CARB STEADY) liquid 237 mL  237 mL Oral Q24H Adhikari, Amrit, MD      . feeding supplement (PRO-STAT SUGAR FREE 64) liquid 30 mL  30 mL Oral TID BM Adhikari, Amrit, MD      . folic acid (FOLVITE) tablet 1 mg  1 mg Oral Daily Rise Patience, MD   1 mg at 12/30/18 1034  . heparin  injection 5,000 Units  5,000 Units Subcutaneous Q8H Rise Patience, MD   5,000 Units at 12/30/18 1330  . labetalol (NORMODYNE) injection 10 mg  10 mg Intravenous Q2H PRN Rise Patience, MD   10 mg at 12/30/18 0252  . levothyroxine (SYNTHROID) tablet 50 mcg  50 mcg Oral QAC breakfast Rise Patience, MD   50 mcg at 12/30/18 0507  . ondansetron (ZOFRAN) tablet 4 mg  4 mg Oral Q6H PRN Rise Patience, MD       Or  . ondansetron [facility]) injection 4 mg  4 mg Intravenous Q6H PRN Rise Patience, MD      . pantoprazole (PROTONIX) EC tablet 40 mg  40 mg Oral Daily Rise Patience, MD   40 mg at 12/30/18 1034  . predniSONE (DELTASONE) tablet 50 mg  50 mg Oral Q breakfast Shelly Coss, MD   50 mg at 12/30/18 1204  . thiamine (VITAMIN B-1) tablet 100 mg  100 mg Oral Daily Rise Patience, MD   100 mg at 12/30/18 1034     Discharge Medications: Please see discharge summary for a list of discharge medications.  Relevant Imaging Results:  Relevant Lab Results:   Additional Information [id number]  Trinita Devlin, Juliann Pulse, [place]

## 2018-12-30 NOTE — Progress Notes (Signed)
Initial Nutrition Assessment  RD working remotely.   DOCUMENTATION CODES:   Not applicable  INTERVENTION:  - will order Nepro Shake once/day, each supplement provides 425 kcal and 19 grams protein. - will order 30 mL Prostat TD, each supplement provides 100 kcal and 15 grams of protein. - continue to encourage PO intakes.    NUTRITION DIAGNOSIS:   Inadequate oral intake related to decreased appetite, acute illness as evidenced by per patient/family report.  GOAL:   Patient will meet greater than or equal to 90% of their needs  MONITOR:   PO intake  REASON FOR ASSESSMENT:   Malnutrition Screening Tool  ASSESSMENT:   73 y.o. male with medical history of stage 3 CKD, cirrhosis, hepatitis C, alcohol abuse, CAD, and HTN. He presented to the ED due to increasing pain in L foot. Patient reported being unable to get out of the bed because of the pain. He also had at least 2 falls. Patient stated he has not had any alcohol for 1 month. Patient stated his left leg has been hurting like typical of his gout. He gets diarrhea whenever he takes lactulose.  No intakes documented since admission. Patient unavailable at this time. Per chart review, current weight is 188 lb and weight on 10/6 was 219 lb. This indicates 31 lb weight loss (14% body weight) in the past 2 months; significant for time frame.   Per notes: - acute on stage 3 CKD--s/p kidney ultrasound with no abnormalities noted - gout with associated L foot pain--no issues seen on xray - hypertensive urgency - AMS--very sleepy/drowsy, plan for CT head - difficulty ambulating   Labs reviewed; Cl: 118 mmol/l, BUN: 79 mg/dl, creatinine: 2.94 mg/dl, Ca: 7.9 mg/dl. Medications reviewed; 1 mg folvite/day, 5 units novolog x1 dose 11/29, 50 mg oral synthroid/day, 40 mg solu-medrol x1 dose 11/30, 50 mg deltasone/day, 100 mg oral thiamine/day.  IVF; D5-NS @ 75 ml/hr.     NUTRITION - FOCUSED PHYSICAL EXAM:  unable to complete at this  time.   Diet Order:   Diet Order            Diet renal with fluid restriction Fluid restriction: 1200 mL Fluid; Room service appropriate? Yes; Fluid consistency: Thin  Diet effective now              EDUCATION NEEDS:   Not appropriate for education at this time  Skin:  Skin Assessment: Skin Integrity Issues: Skin Integrity Issues:: Stage II Stage II: bilateral buttocks  Last BM:  11/30  Height:   Ht Readings from Last 1 Encounters:  12/29/18 5\' 11"  (1.803 m)    Weight:   Wt Readings from Last 1 Encounters:  12/30/18 85.3 kg    Ideal Body Weight:  78.2 kg  BMI:  Body mass index is 26.22 kg/m.  Estimated Nutritional Needs:   Kcal:  1900-2100 kcal  Protein:  90-100 grams  Fluid:  >/= 1.2 L/day      Jarome Matin, MS, RD, LDN, [facility] Inpatient Clinical Dietitian Pager # [phone] After hours/weekend pager # [phone]
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
## 2018-12-30 NOTE — Progress Notes (Signed)
Admit: 12/29/2018 LOS: 1  40M AoCKD3-4 and hyperkalemia  Subjective:  . Joint pain improving, started steroids . K < 5  . SCr improved . Not much recored UOP . BPs high . CK was < 1000  11/29 0701 - 11/30 0700 In: 424.9 [I.V.:374.9; IV Piggyback:50] Out: 175 [Urine:175]  Filed Weights   12/29/18 2300 12/30/18 0500  Weight: 85.5 kg 85.3 kg    Scheduled Meds: . amLODipine  10 mg Oral Daily  . carvedilol  3.125 mg Oral BID WC  . cloNIDine  0.3 mg Transdermal Weekly  . folic acid  1 mg Oral Daily  . heparin  5,000 Units Subcutaneous Q8H  . levothyroxine  50 mcg Oral QAC breakfast  . pantoprazole  40 mg Oral Daily  . predniSONE  50 mg Oral Q breakfast  . thiamine  100 mg Oral Daily   Continuous Infusions: . dextrose 5 % and 0.9% NaCl     PRN Meds:.labetalol, ondansetron **OR** ondansetron (ZOFRAN) IV  Current Labs: reviewed    Physical Exam:  Blood pressure (!) 175/59, pulse 93, temperature 98.7 F (37.1 C), temperature source Oral, resp. rate 16, height 5\' 11"  (1.803 m), weight 85.3 kg, SpO2 98 %. NAD, looks comfortable RRR nl s1s2 CTAB No sig LEE No rashes/lesions Nl mood/affect cn2-12 intact  A 1. AoCKD3: improving withy hydration; CK WNL; 11/30 Renal US neg for obstruction 2. Hyperkalemia: resolved with med mgmt 3. Arthralgias, ? Gout 4. PSA 5. HTN  P . Given alkalosis, resp; stop bicarb gtt, change to D5NS . Will follow for another 24h   Pearson Grippe MD 12/30/2018, 12:58 PM  Recent Labs  Lab 12/29/18 1650 12/29/18 2123 12/30/18 0050 12/30/18 0433  NA 140  --   --  144  145  K 6.7* 6.2* 5.6* 4.8  4.8  CL 117*  --   --  117*  118*  CO2 15*  --   --  17*  17*  GLUCOSE 100*  --   --  110*  110*  BUN 87*  --   --  78*  79*  CREATININE 3.25*  --   --  2.95*  2.94*  CALCIUM 7.9*  --   --  7.8*  7.9*  PHOS  --   --   --  4.6   Recent Labs  Lab 12/29/18 1650 12/30/18 0433  WBC 10.4 10.1  NEUTROABS 6.6  --   HGB 11.8* 11.1*  HCT  33.6* 32.0*  MCV 82.4 83.1  PLT 225 198

## 2018-12-30 NOTE — Plan of Care (Signed)

## 2018-12-30 NOTE — TOC Initial Note (Signed)
Transition of Care [facility]) - Initial/Assessment Note    Patient Details  Name: Bruce Hayes MRN: [id number] Date of Birth: [birth year]  Transition of Care [facility]) CM/SW Contact:    Dessa Phi, RN Phone Number: 12/30/2018, 10:37 AM  Clinical Narrative: PT cons-await recc.                  Expected Discharge Plan: Home/Self Care Barriers to Discharge: Continued Medical Work up   Patient Goals and CMS Choice Patient states their goals for this hospitalization and ongoing recovery are:: go home      Expected Discharge Plan and Services Expected Discharge Plan: Home/Self Care   Discharge Planning Services: CM Consult   Living arrangements for the past 2 months: [place]                                      Prior Living Arrangements/Services Living arrangements for the past 2 months: [place] Lives with:: Self Patient language and need for interpreter reviewed:: Yes Do you feel safe going back to the place where you live?: Yes          Current home services: DME(cane, rw) Criminal Activity/Legal Involvement Pertinent to Current Situation/Hospitalization: No - Comment as needed  Activities of Daily Living Home Assistive Devices/Equipment: Cane (specify quad or straight) ADL Screening (condition at time of admission) Patient's cognitive ability adequate to safely complete daily activities?: Yes Is the patient deaf or have difficulty hearing?: No Does the patient have difficulty seeing, even when wearing glasses/contacts?: No Does the patient have difficulty concentrating, remembering, or making decisions?: No Patient able to express need for assistance with ADLs?: Yes Does the patient have difficulty dressing or bathing?: No Independently performs ADLs?: Yes (appropriate for developmental age) Does the patient have difficulty walking or climbing stairs?: Yes Weakness of Legs: Both Weakness of Arms/Hands:  None  Permission Sought/Granted Permission sought to share information with : Case Manager Permission granted to share information with : Yes, Verbal Permission Granted  Share Information with NAME: Bruce Hayes     Permission granted to share info w Relationship: sister  Permission granted to share info w Contact Information: [phone]  Emotional Assessment              Admission diagnosis:  Acute on chronic renal failure ([place]) [N17.9, N18.9] Patient Active Problem List   Diagnosis Date Noted  . Acute on chronic renal failure ([place]) 12/29/2018  . Acute metabolic encephalopathy 48/18/5631  . Hepatic encephalopathy ([place]) 10/31/2018  . Encounter for colonoscopy due to history of adenomatous colonic polyps   . Benign neoplasm of ascending colon   . Other cirrhosis of liver ([place])   . Gastritis and gastroduodenitis   . Portal hypertensive gastropathy ([place])   . Hypertensive urgency 08/27/2016  . Acute kidney injury superimposed on CKD ([place]) 08/27/2016  . Headache 08/27/2016  . Blurred vision 08/27/2016  . AKI (acute kidney injury) ([place]) 08/27/2016  . Severe alcohol use disorder ([place]) 11/11/2015  . Alcohol use disorder, severe, dependence ([place]) 11/08/2015  . Alcohol abuse 12/07/2013  . Cocaine abuse ([place]) 12/07/2013  . Polysubstance abuse ([place]) 12/07/2013  . Alcohol use disorder, moderate, dependence ([place]) 12/07/2013  . Depression   . 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 CKD (chronic kidney disease), stage III 09/06/2007  . HEPATITIS B CARRIER 07/16/2007  . UNSPECIFIED DISORDER OF LIVER  07/03/2007  . HEPATITIS C 05/22/2007  . HYPERLIPIDEMIA 05/22/2007  . OBESITY 05/22/2007  . THROMBOCYTOPENIA 05/22/2007  . TOBACCO ABUSE 05/22/2007  . Essential hypertension 05/22/2007  . COCAINE ABUSE, HX OF 05/22/2007   PCP:  Lorene Dy, MD Pharmacy:   [facility] [place] [address] Phone: [phone] Fax: [phone]     Social Determinants of Health (SDOH) Interventions    Readmission Risk Interventions Readmission Risk Prevention Plan 11/02/2018  Transportation Screening Complete  PCP or Specialist Appt within 3-5 Days Not Complete  Not Complete comments plan for SNF  HRI or [place] Not Complete  HRI or Home Care Consult comments plan for SNF  Social Work Consult for [place] Planning/Counseling Complete  Palliative Care Screening Not Applicable  Medication Review (RN Care Manager) Complete  Some recent data might be hidden

## 2018-12-30 NOTE — Progress Notes (Signed)
RT notified Lab that an ABG is being sent for analysis.  This ABG was drawn on room air.

## 2018-12-30 NOTE — Progress Notes (Signed)
PROGRESS NOTE    Bruce Hayes  VZD:[id number] DOB: 15-May-1945 DOA: 12/29/2018 PCP: Lorene Dy, MD   Brief Narrative: Patient is a 73 year old male with history of CKD stage III, liver cirrhosis/hepatitis C, alcohol abuse, gout, coronary disease, hypertension who presents with complaint of pain on the left foot.  He said he has history of gout.  On presentation, his left ankle was swollen and tender.  He was also found to have potassium of 6.7, creatinine of 3.2.  Oncology was consulted.  Started on steroids for gout.  This morning he looked very drowsy, sleepy.  Assessment & Plan:   Principal Problem:   Acute on chronic renal failure (HCC) Active Problems:   CKD (chronic kidney disease), stage III   Hypertensive urgency   Acute on chronic CKD stage III: Baseline creatinine of 1.7.  Presented with creatinine more than 3.  Also found to be hyperkalemic.  Started on IV fluids, Kayexalate.  Nephrology following.  Kidney function improving today.  He is having good urine output.  Ultrasound kidney did not show any abnormalities.  Gout/Left foot pain: Most likely secondary to gouty arthritis .Xray did not  any fracture /dislocation.  He was given a dose of IV Solu-Medrol.  We will continue prednisone.  Uric acid level elevated.  Hypertensive urgency: Continue current medicines.  Blood pressure more controlled now.  On clonidine patch, Coreg, amlodipine  History of liver cirrhosis/history of hepatitis C/chronic alcoholism: Denies any alcohol intake for last 1 month.  Monitor for withdrawal.  On lactulose at home.  Coronary artery disease: Denies any chest pain.  Continue current meds  Altered mental status: Patient is very sleepy and drowsy this morning.  I will check ammonia level, ABG and CT scan of the head.  Difficulty ambulation: Uses cane at baseline.  Complains of left foot pain.  Will request for PT/OT evaluation.         DVT prophylaxis:Heparin [place] Code Status:  Full Family Communication: None present at the bedside Disposition Plan: Home after improvement in the mental status, improvement in the kidney function and left ankle pain   Consultants: Nephrology  Procedures: None  Antimicrobials:  Anti-infectives (From admission, onward)   None      Subjective:  Patient seen and examined the bedside this morning.  He appeared very drowsy, sleepy and was hardly opening his eyes.  Upon calling his name, he opened his eyes and talk but falls to sleep often.  Complains of severe pain on the left ankle.  Hemodynamically stable.   Objective: Vitals:   12/30/18 0446 12/30/18 0500 12/30/18 0755 12/30/18 1000  BP: (!) 168/90  (!) 177/54 (!) 175/59  Pulse: 95  93   Resp: 18  16   Temp: 97.7 F (36.5 C)  98.7 F (37.1 C)   TempSrc: Oral  Oral   SpO2: 98%  98%   Weight:  85.3 kg    Height:        Intake/Output Summary (Last 24 hours) at 12/30/2018 1135 Last data filed at 12/30/2018 0255 Gross per 24 hour  Intake 424.93 ml  Output 175 ml  Net 249.93 ml   Filed Weights   12/29/18 2300 12/30/18 0500  Weight: 85.5 kg 85.3 kg    Examination:  General exam: Sleepy,drowsy,Not in distress,average built HEENT:PERRL,Ear/Nose normal on gross exam Respiratory system: Bilateral equal air entry, normal vesicular breath sounds, no wheezes or crackles  Cardiovascular system: S1 & S2 heard, RRR. No JVD, murmurs, rubs, gallops or clicks. No  pedal edema. Gastrointestinal system: Abdomen is nondistended, soft and nontender. No organomegaly or masses felt. Normal bowel sounds heard. Central nervous system: Sleepy,drowsy Extremities: No edema, no clubbing ,no cyanosis, distal peripheral pulses palpable. Severe tenderness on the left foot and ankle Skin: No rashes, lesions or ulcers,no icterus ,no pallor  Data Reviewed: I have personally reviewed following labs and imaging studies  CBC: Recent Labs  Lab 12/29/18 1650 12/30/18 0433  WBC 10.4 10.1   NEUTROABS 6.6  --   HGB 11.8* 11.1*  HCT 33.6* 32.0*  MCV 82.4 83.1  PLT 225 782   Basic Metabolic Panel: Recent Labs  Lab 12/29/18 1650 12/29/18 2123 12/30/18 0050 12/30/18 0433  NA 140  --   --  144  145  K 6.7* 6.2* 5.6* 4.8  4.8  CL 117*  --   --  117*  118*  CO2 15*  --   --  17*  17*  GLUCOSE 100*  --   --  110*  110*  BUN 87*  --   --  78*  79*  CREATININE 3.25*  --   --  2.95*  2.94*  CALCIUM 7.9*  --   --  7.8*  7.9*  PHOS  --   --   --  4.6   GFR: Estimated Creatinine Clearance: 24.5 mL/min (A) (by C-G formula based on SCr of 2.94 mg/dL (H)). Liver Function Tests: Recent Labs  Lab 12/30/18 0433  AST 72*  ALT 41  ALKPHOS 110  BILITOT 4.1*  PROT 7.3  ALBUMIN 2.2*  2.2*   No results for input(s): LIPASE, AMYLASE in the last 168 hours. No results for input(s): AMMONIA in the last 168 hours. Coagulation Profile: No results for input(s): INR, PROTIME in the last 168 hours. Cardiac Enzymes: Recent Labs  Lab 12/30/18 0433  CKTOTAL 601*   BNP (last 3 results) No results for input(s): PROBNP in the last 8760 hours. HbA1C: No results for input(s): HGBA1C in the last 72 hours. CBG: Recent Labs  Lab 12/29/18 1820 12/29/18 1917  GLUCAP 88 100*   Lipid Profile: No results for input(s): CHOL, HDL, LDLCALC, TRIG, CHOLHDL, LDLDIRECT in the last 72 hours. Thyroid Function Tests: No results for input(s): TSH, T4TOTAL, FREET4, T3FREE, THYROIDAB in the last 72 hours. Anemia Panel: No results for input(s): VITAMINB12, FOLATE, FERRITIN, TIBC, IRON, RETICCTPCT in the last 72 hours. Sepsis Labs: No results for input(s): PROCALCITON, LATICACIDVEN in the last 168 hours.  Recent Results (from the past 240 hour(s))  SARS CORONAVIRUS 2 (TAT 6-24 HRS) Nasopharyngeal Nasopharyngeal Swab     Status: None   Collection Time: 12/29/18  5:58 PM   Specimen: Nasopharyngeal Swab  Result Value Ref Range Status   SARS Coronavirus 2 NEGATIVE NEGATIVE Final    Comment:  (NOTE) SARS-CoV-2 target nucleic acids are NOT DETECTED. The SARS-CoV-2 RNA is generally detectable in upper and lower respiratory specimens during the acute phase of infection. Negative results do not preclude SARS-CoV-2 infection, do not rule out co-infections with other pathogens, and should not be used as the sole basis for treatment or other patient management decisions. Negative results must be combined with clinical observations, patient history, and epidemiological information. The expected result is Negative. Fact Sheet for Patients: SugarRoll.be Fact Sheet for Healthcare Providers: https://www.woods-mathews.com/ This test is not yet approved or cleared by the [place] FDA and  has been authorized for detection and/or diagnosis of SARS-CoV-2 by FDA under an Emergency Use Authorization (EUA). This EUA will remain  in effect (meaning this test can be used) for the duration of the COVID-19 declaration under Section 56 4(b)(1) of the Act, 21 U.S.C. section 360bbb-3(b)(1), unless the authorization is terminated or revoked sooner. Performed at [facility], [place] [address] [place], [place] [address]          Radiology Studies: Dg Ankle Complete Left  Result Date: 12/29/2018 CLINICAL DATA:  Left foot and ankle pain, swelling and warmth to the touch. No known injury. History of gout. EXAM: LEFT ANKLE COMPLETE - 3+ VIEW COMPARISON:  None. FINDINGS: There is no evidence of fracture, dislocation, or joint effusion. Midfoot osteoarthritis and plantar calcaneal spur noted. Soft tissues are unremarkable. IMPRESSION: No acute abnormality. Electronically Signed   By: Inge Rise M.D.   On: 12/29/2018 17:27   US Renal  Result Date: 12/30/2018 CLINICAL DATA:  Acute and chronic renal failure. EXAM: RENAL / URINARY TRACT ULTRASOUND COMPLETE COMPARISON:  MRI 10/18/2018. FINDINGS: Right Kidney: Renal measurements: 10.1 x 4.3 x 4.5 cm  = volume: 11.5 mL . Echogenicity within normal limits. No mass or hydronephrosis visualized. Left Kidney: Renal measurements: 10.2 x 4.7 x 4.6 cm = volume: 115.0 mL. Echogenicity within normal limits. No mass or hydronephrosis visualized. Bladder: Appears normal for degree of bladder distention. Bilateral ureteral jets noted. Other: None. IMPRESSION: Negative exam. Electronically Signed   By: Marcello Moores  Register   On: 12/30/2018 07:05   Dg Foot Complete Left  Result Date: 12/29/2018 CLINICAL DATA:  Left foot and ankle pain, swelling and warmth to the touch. No known injury. History of gout. EXAM: LEFT FOOT - COMPLETE 3+ VIEW COMPARISON:  None. FINDINGS: There is no evidence of fracture or dislocation. Plantar calcaneal spur and midfoot osteoarthritis are noted. Bones are osteopenic. Soft tissues are unremarkable. IMPRESSION: No acute abnormality. Plantar calcaneal spur. Midfoot osteoarthritis. Osteopenia. Electronically Signed   By: Inge Rise M.D.   On: 12/29/2018 17:26   Vas [place] Lower Extremity Venous (dvt) (only [facility])  Result Date: 12/29/2018  Lower Venous Study Indications: Edema.  Comparison Study: Prior study from 05/04/14 is available for comparison Performing Technologist: Sharion Dove RVS  Examination Guidelines: A complete evaluation includes B-mode imaging, spectral Doppler, color Doppler, and power Doppler as needed of all accessible portions of each vessel. Bilateral testing is considered an integral part of a complete examination. Limited examinations for reoccurring indications may be performed as noted.  +-----+---------------+---------+-----------+----------+--------------+ RIGHTCompressibilityPhasicitySpontaneityPropertiesThrombus Aging +-----+---------------+---------+-----------+----------+--------------+ CFV  Full           Yes      Yes                                 +-----+---------------+---------+-----------+----------+--------------+    +---------+---------------+---------+-----------+----------+--------------+ LEFT     CompressibilityPhasicitySpontaneityPropertiesThrombus Aging +---------+---------------+---------+-----------+----------+--------------+ CFV      Full           Yes      Yes                                 +---------+---------------+---------+-----------+----------+--------------+ SFJ      Full                                                        +---------+---------------+---------+-----------+----------+--------------+  FV Prox  Full                                                        +---------+---------------+---------+-----------+----------+--------------+ FV Mid   Full                                                        +---------+---------------+---------+-----------+----------+--------------+ FV DistalFull                                                        +---------+---------------+---------+-----------+----------+--------------+ PFV      Full                                                        +---------+---------------+---------+-----------+----------+--------------+ POP      Full           Yes      Yes                                 +---------+---------------+---------+-----------+----------+--------------+ PTV      Full                                                        +---------+---------------+---------+-----------+----------+--------------+ PERO     Full                                                        +---------+---------------+---------+-----------+----------+--------------+     Summary: Right: No evidence of common femoral vein obstruction. Left: Findings appear essentially unchanged compared to previous examination. There is no evidence of deep vein thrombosis in the lower extremity.  *See table(s) above for measurements and observations.    Preliminary         Scheduled Meds: . amLODipine  10 mg Oral Daily  .  carvedilol  3.125 mg Oral BID WC  . cloNIDine  0.3 mg Transdermal Weekly  . folic acid  1 mg Oral Daily  . heparin  5,000 Units Subcutaneous Q8H  . levothyroxine  50 mcg Oral QAC breakfast  . pantoprazole  40 mg Oral Daily  . thiamine  100 mg Oral Daily   Continuous Infusions: . dextrose 5 % 1,000 mL with sodium bicarbonate 75 mEq infusion Stopped (12/30/18 0227)     LOS: 1 day    Time spent: 35 mins.More than 50% of that time was spent in counseling and/or coordination of care.      Shelly Coss,  MD [facility] Pager [phone]  If 7PM-7AM, please contact night-coverage [email or web address] Password [email or web address] 12/30/2018, 11:35 AM

## 2018-12-31 LAB — CBC WITH DIFFERENTIAL/PLATELET
Abs Immature Granulocytes: 0.07 10*3/uL (ref 0.00–0.07)
Basophils Absolute: 0 10*3/uL (ref 0.0–0.1)
Basophils Relative: 0 %
Eosinophils Absolute: 0 10*3/uL (ref 0.0–0.5)
Eosinophils Relative: 0 %
HCT: 30.1 % — ABNORMAL LOW (ref 39.0–52.0)
Hemoglobin: 10.5 g/dL — ABNORMAL LOW (ref 13.0–17.0)
Immature Granulocytes: 1 %
Lymphocytes Relative: 18 %
Lymphs Abs: 1.3 10*3/uL (ref 0.7–4.0)
MCH: 29.3 pg (ref 26.0–34.0)
MCHC: 34.9 g/dL (ref 30.0–36.0)
MCV: 84.1 fL (ref 80.0–100.0)
Monocytes Absolute: 0.7 10*3/uL (ref 0.1–1.0)
Monocytes Relative: 10 %
Neutro Abs: 5 10*3/uL (ref 1.7–7.7)
Neutrophils Relative %: 71 %
Platelets: 136 10*3/uL — ABNORMAL LOW (ref 150–400)
RBC: 3.58 MIL/uL — ABNORMAL LOW (ref 4.22–5.81)
RDW: 14.8 % (ref 11.5–15.5)
WBC: 7 10*3/uL (ref 4.0–10.5)
nRBC: 0 % (ref 0.0–0.2)

## 2018-12-31 LAB — RENAL FUNCTION PANEL
Albumin: 2.1 g/dL — ABNORMAL LOW (ref 3.5–5.0)
Anion gap: 10 (ref 5–15)
BUN: 78 mg/dL — ABNORMAL HIGH (ref 8–23)
CO2: 16 mmol/L — ABNORMAL LOW (ref 22–32)
Calcium: 7.4 mg/dL — ABNORMAL LOW (ref 8.9–10.3)
Chloride: 119 mmol/L — ABNORMAL HIGH (ref 98–111)
Creatinine, Ser: 2.85 mg/dL — ABNORMAL HIGH (ref 0.61–1.24)
GFR calc Af Amer: 25 mL/min — ABNORMAL LOW (ref >60)
GFR calc non Af Amer: 21 mL/min — ABNORMAL LOW (ref >60)
Glucose, Bld: 168 mg/dL — ABNORMAL HIGH (ref 70–99)
Phosphorus: 3.9 mg/dL (ref 2.5–4.6)
Potassium: 4 mmol/L (ref 3.5–5.1)
Sodium: 145 mmol/L (ref 135–145)

## 2018-12-31 NOTE — Progress Notes (Signed)
Admit: 12/29/2018 LOS: 2  77M AoCKD3-4 and hyperkalemia  Subjective:  . No c/o . 0.8L UOP . SCr slightly improved, K 4.0 . JOints stable/slightly better . For SNF at [place]   11/30 0701 - 12/01 0700 In: 1225.7 [P.O.:717; I.V.:508.7] Out: 750 [Urine:750]  Filed Weights   12/29/18 2300 12/30/18 0500 12/31/18 0700  Weight: 85.5 kg 85.3 kg 86.8 kg    Scheduled Meds: . amLODipine  10 mg Oral Daily  . carvedilol  3.125 mg Oral BID WC  . cloNIDine  0.3 mg Transdermal Weekly  . feeding supplement (NEPRO CARB STEADY)  237 mL Oral Q24H  . feeding supplement (PRO-STAT SUGAR FREE 64)  30 mL Oral TID BM  . folic acid  1 mg Oral Daily  . heparin  5,000 Units Subcutaneous Q8H  . levothyroxine  50 mcg Oral QAC breakfast  . pantoprazole  40 mg Oral Daily  . predniSONE  50 mg Oral Q breakfast  . thiamine  100 mg Oral Daily   Continuous Infusions: . dextrose 5 % and 0.9% NaCl 75 mL/hr at 12/30/18 1327   PRN Meds:.labetalol, ondansetron **OR** ondansetron (ZOFRAN) IV  Current Labs: reviewed    Physical Exam:  Blood pressure 135/69, pulse 79, temperature 98 F (36.7 C), temperature source Oral, resp. rate 16, height 5\' 11"  (1.803 m), weight 86.8 kg, SpO2 99 %. NAD, looks comfortable RRR nl s1s2 CTAB No sig LEE No rashes/lesions Nl mood/affect cn2-12 intact  A 1. AoCKD3: improving withy hydration; CK WNL; 11/30 Renal US neg for obstruction 2. Hyperkalemia: resolved with med mgmt 3. Arthralgias, ? Gout improving with pred 4. PSA 5. HTN  P . Stop IVFs . Might be nearing new baseline SCr . Will need outpt CKA f/u . Will follow for another 24h  . Daily weights, Daily Renal Panel, Strict I/Os, Avoid nephrotoxins (NSAIDs, judicious IV Contrast)    Pearson Grippe MD 12/31/2018, 1:58 PM  Recent Labs  Lab 12/29/18 1650  12/30/18 0050 12/30/18 0433 12/31/18 0432  NA 140  --   --  144  145 145  K 6.7*   < > 5.6* 4.8  4.8 4.0  CL 117*  --   --  117*  118* 119*  CO2 15*  --    --  17*  17* 16*  GLUCOSE 100*  --   --  110*  110* 168*  BUN 87*  --   --  78*  79* 78*  CREATININE 3.25*  --   --  2.95*  2.94* 2.85*  CALCIUM 7.9*  --   --  7.8*  7.9* 7.4*  PHOS  --   --   --  4.6 3.9   < > = values in this interval not displayed.   Recent Labs  Lab 12/29/18 1650 12/30/18 0433 12/31/18 0432  WBC 10.4 10.1 7.0  NEUTROABS 6.6  --  5.0  HGB 11.8* 11.1* 10.5*  HCT 33.6* 32.0* 30.1*  MCV 82.4 83.1 84.1  PLT 225 198 136*
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
## 2018-12-31 NOTE — TOC Progression Note (Signed)
Transition of Care [facility]) - Progression Note    Patient Details  Name: Bruce Hayes MRN: [id number] Date of Birth: 08-16-1945  Transition of Care [facility]) CM/SW Contact  Ilani Otterson, Juliann Pulse, RN Phone Number: 12/31/2018, 9:11 AM  Clinical Narrative:   12/31/2018 Medicare Nursing Home Results [email or web address] 1/9 Close window 31 hospitals within 25 miles from the center of [address]. Nursing Home Search Results Results List Table Nursing Home Information Overall Rating Health Inpections Staffing Quality Ratings Distance [place] [facility] [place], [place] [address] [phone] Below Average Below Average Below Average Below Average 1.62 Torrie Mayers [place], [place] [address] [phone] Much Above Average Above Average Much Above Average Much Above Average 1.77 [place] 2 out of 5 stars 2 out of 5 stars 2 out of 5 stars 2 out of 5 stars 5 out of 5 stars 4 out of 5 stars 5 out of 5 stars 5 out of 5 stars 12/31/2018 Medicare Nursing Home Results [email or web address] 2/9 Nursing Home Information Overall Rating Health Inpections Staffing Quality Ratings Distance [facility] [place], [place] [address] [phone] Below Average Below Average Below Average Below Average 2.65 [place], [place] [address] [phone] Much Below Average Below Average Much Below Average Average 2.72 [facility] [place], [place] [address] [phone] Much Above Average Much Above Average Not Available12 Much  Above Average 3.91 [facility] [address] [place], [place] [address] [phone] Much Below Average Much Below Average Below Average Below Average 4.01 Miles 2 out of 5 stars 2 out of 5 stars 2 out of 5 stars 2 out of 5 stars 1 out of 5 stars 2 out of 5 stars 1 out of 5 stars 3 out of 5 stars 5 out of 5 stars 5 out of 5 stars 5 out of 5 stars 1 out of 5 stars 1 out of 5 stars 2 out of 5 stars 2 out of 5 stars 12/31/2018 Medicare Nursing Home Results [email or web address] 3/9 Nursing Home Information Overall Rating Health Inpections Staffing Quality Ratings Distance [place] [address] [place], [place] [address] [phone] Much Below Average Much Below Average Average Much Below Average 4.42 [place], [place] [address] [phone] Below Average Below Average Below Average Below Average 4.63 [place], [place] [address] [phone] Much Above Average Above Average Much Above Average Much Above Average 4.94 [facility] [address] [place], [place] [address] [phone] Below Average Below Average Below Average Average 5.19 Miles 1 out of 5 stars 1 out of 5 stars 3 out of 5 stars 1 out of 5 stars 2 out of 5 stars 2 out of 5 stars 2 out of 5 stars 2 out of 5 stars 5 out of 5 stars 4 out of 5 stars 5 out of 5 stars 5 out of 5 stars 2 out of 5 stars 2 out of 5 stars 2 out of 5 stars 3 out of 5 stars 12/31/2018 Medicare Nursing Home Results [email or web address] 4/9 [place] [address] [place], [place] [address] [phone] Much Above Average Much  Above Average Not Available12 Below Average 5.33 [facility] [employer] [employer], [place] [address] [phone] Below Average Below Average Below Average Average 6.33 [facility] [address] [place], [place] [address] [phone] Average Average Average Average 7.11 [facility] [address] [place], [place] [address] [phone] Above Average Above Average Below Average Above Average 10.82 Miles 5 out of 5 stars 5 out of 5 stars 2 out of 5 stars 2 out of 5 stars 2 out of 5 stars 2 out of 5 stars 3 out of 5 stars 3 out of 5 stars 3 out of 5 stars 3 out of 5 stars 3 out of 5 stars 4 out of 5 stars 4 out of 5 stars 2 out of 5 stars 4 out of 5 stars 12/31/2018 Medicare Nursing Home Results [email or web address] 5/9 Nursing Home Information Overall Rating Health Inpections Staffing Quality Ratings Distance [facility] [place], [place] [address] [phone] Much Below Average Much Below Average Below Average Below Average 11.16 [place], [place] [address] [phone] Below Average Below Average Below Average Below Average 11.29 [facility] [place], [place] [address] [phone] Much Above Average Much Above Average Not Available12 Much Above Average 11.87 [place] [address] [place], [place] [address] [phone] Much Above Average Much  Above Average Above Average Much Above Average 13.30 Miles 1 out of 5 stars 1 out of 5 stars 2 out of 5 stars 2 out of 5 stars 2 out of 5 stars 2 out of 5 stars 2 out of 5 stars 2 out of 5 stars 5 out of 5 stars 5 out of 5 stars 5 out of 5 stars 5 out of 5 stars 5 out of 5 stars 4 out of 5 stars 5 out of 5 stars 12/31/2018 Medicare Nursing Home Results [email or web address] 6/9 [place] Staffing Quality Ratings Distance [place] [address] [place], [place] [address] [phone] Much Below Average Much Below Average Much Below Average Average 15.65 [place], [place] [address] [phone] Much Below Average Much Below Average Below Average Below Average 16.26 [facility] [address] [place], [place] [address] [phone] Much Below Average Much Below Average Not Available12 Average 16.42 [place] [address] [place], [place] [address] [phone] Much Below Average Much Below Average Below Average Average 16.54 [facility] [address] [place] [address] [place], [place] [address] [phone] Above Average Average Below Average Much Above Average 16.97 Miles 1 out of 5 stars 1 out of 5 stars 1 out of 5 stars 3 out of 5 stars 1 out of 5 stars 1 out of 5 stars 2 out of 5 stars 2 out of 5 stars 1 out of 5 stars 1 out of 5 stars 3 out of 5 stars 1 out of 5 stars 1 out of 5 stars 2 out of 5 stars 3 out of 5 stars 4 out of 5 stars 3 out of 5 stars 2 out of 5 stars 5 out of 5 stars 12/31/2018 Medicare Nursing Home Results [email or web address] 7/9 [place] Overall Rating Health Inpections Staffing Quality Ratings Distance [place]  [place], [place] [address] [phone] Much Below Average Much Below Average Average Average 17.95 [facility] AT [address] [place], [place] [address] [phone] Average Average Below Average Average 18.35 [facility] & RETIREMENT [address] [place], [place] [address] [phone] Below Average Below Average Below Average Below Average 18.54 [facility] [address] [place], [place] [address] [phone] Much Above Average Above Average Much Above Average Much Above Average 20.96 Miles 1 out of 5 stars 1 out of 5 stars 3 out of 5 stars 3 out of 5 stars 3 out of 5 stars 3 out of 5 stars 2 out of 5 stars 3 out of 5 stars 2 out of 5 stars 2 out of 5 stars 2 out of 5 stars 2 out of 5 stars 5 out of 5 stars 4 out of 5 stars 5 out of 5 stars 5 out of 5 stars 12/31/2018 Medicare Nursing Home Results [email or web address] 8/9 Nursing Home Information Overall Rating Health Inpections Staffing Quality Ratings Distance [facility] [place], [place] [address] [phone] Much Above Average Above Average Not Available12 Much Above Average 21.22 [facility]&R [place] [address] [place], [place] [address] [phone] Average Average Below Average Below Average 80.88 [facility] [address] [place], [place] [address] [phone] Much Below Average Much Below Average Much Below Average Below Average 22.95 [place] [address] [place],  [address] [phone] Much Below Average Much Below Average Average Below Average 24.73 Miles NursingHome[facility] Footnotes 5 out of 5 stars 4 out of 5 stars 5 out of 5 stars 3 out of 5 stars 3 out of 5 stars 2 out of 5 stars 2 out of 5 stars 1 out of 5 stars 1 out of 5 stars 1 out of 5 stars 2 out of 5 stars 1 out of 5 stars 1 out of 5 stars 3 out of 5 stars 2 out of 5 stars 12/31/2018 Medicare Nursing Home Results [email or web address] 9/9 Footnote number Footnote as displayed on nursing home Compare Footnote number Footnote as displayed on nursing home Compare 1 Newly certified nursing home with less than 12-15 months of data available or the nursing opened less than 6 months ago, and there were no data to submit or claims for this measure. 2 Not enough data available to calculate a star rating. 6 This facility did not submit staffing data, or submitted data that did not meet the criteria required to calculate a staffing measure. 7 CMS determined that the percentage was not accurate or data suppressed by CMS for one or more quarters. 9 The number of residents or resident stays is too small to report. Call the facility to discuss this quality measure. 10 The data for this measure is missing or was not submitted. Call the facility to discuss this quality measure. 12 This facility either did not submit staffing data, has reported a high number of days without a registered nurse onsite, or submitted data that could not be verified through an audit. 13 Results are based on a shorter time period than required. 14 This nursing home is not required to submit data for the [place] Reporting Program. 18 This facility is not rated due to a history of serious quality issues and is included in the special focus facility program.    Expected Discharge Plan:  Skilled  Nursing Facility Barriers to Discharge: Continued Medical Work up  Expected Discharge Plan and Services Expected Discharge Plan: [place]   Discharge Planning Services: CM Consult   Living arrangements for the past 2 months: [place]                                       Social Determinants of Health (SDOH) Interventions    Readmission Risk Interventions Readmission Risk Prevention Plan 12/30/2018 11/02/2018  Transportation Screening Complete Complete  PCP or Specialist Appt within 3-5 Days - Not Complete  Not Complete comments - plan for SNF  HRI or [place] - Not Complete  HRI or Home Care Consult comments - plan for SNF  Social Work Consult for [place] Planning/Counseling - Complete  Palliative Care Screening - Not Applicable  Medication Review (RN Care Manager) Complete Complete  PCP or Specialist appointment within 3-5 days of discharge Not Complete -  PCP/Specialist Appt Not Complete comments Patient will make own pcp appt;not ready for d/c yet. -  HRI or Home Care Consult Complete -  SW Recovery Care/Counseling Consult Complete -  Palliative Care Screening Not Applicable -  [place] Not Applicable -  Some recent data might be hidden

## 2018-12-31 NOTE — Progress Notes (Signed)
NUTRITION NOTE RD working remotely.   Message received from Case Manager that facility has Nepro but does not have prostat. Continue current orders during admission and change to 237 ml Nepro TID at facility at the time of discharge.  RD will continue to follow per protocol.     Jarome Matin, MS, RD, LDN, [facility] Inpatient Clinical Dietitian Pager # [phone] After hours/weekend pager # [phone]

## 2018-12-31 NOTE — Progress Notes (Signed)
PROGRESS NOTE    Bruce Hayes  QIW:[id number] DOB: 12/11/1945 DOA: 12/29/2018 PCP: Lorene Dy, MD   Brief Narrative: Patient is a 73 year old male with history of CKD stage III, liver cirrhosis/hepatitis C, alcohol abuse, gout, coronary disease, hypertension who presents with complaint of pain on the left foot.  He said he has history of gout.  On presentation, his left ankle was swollen and tender.  He was also found to have potassium of 6.7, creatinine of 3.2.  Nephrology was consulted.  Started on steroids for gout.  Patient seen by physical therapy and recommended SNF on discharge.  Plan for discharge tomorrow.  Assessment & Plan:   Principal Problem:   Acute on chronic renal failure (HCC) Active Problems:   CKD (chronic kidney disease), stage III   Hypertensive urgency   Acute on chronic CKD stage III: Baseline creatinine of 1.7.  Presented with creatinine more than 3.  Also found to be hyperkalemic.  Started on IV fluids, Kayexalate.  Nephrology following.  Kidney function improving .  He is having good urine output.  Ultrasound kidney did not show any abnormalities.  Gout/Left foot pain: Most likely secondary to gouty arthritis .Xray did not  any fracture /dislocation.  He was given a dose of IV Solu-Medrol.  We will continue prednisone.  Uric acid level elevated.  Hypertensive urgency: Continue current medicines.  Blood pressure more controlled now.  On clonidine patch, Coreg, amlodipine  History of liver cirrhosis/history of hepatitis C/chronic alcoholism: Denies any alcohol intake for last 1 month.  Monitor for withdrawal.  On lactulose at home.  Coronary artery disease: Denies any chest pain.  Continue current meds  Altered mental status: Patient is very sleepy and drowsy yesterday.  Ammonia level normal.  CT head did not show any acute intracranial abnormalities.  Currently he is alert and oriented.  Difficulty ambulation: Uses cane at baseline.  Complains of left  foot pain.   PT/OT evaluation recommended SNF  Protein calorie malnutrition: We will change Prostat to Nepro to 237 mL 3 times daily  Nutrition Problem: Inadequate oral intake Etiology: decreased appetite, acute illness      DVT prophylaxis:Heparin [place] Code Status: Full Family Communication: Called sister on phone 12/31/18 Disposition Plan: SNF tomorrow  Consultants: Nephrology  Procedures: None  Antimicrobials:  Anti-infectives (From admission, onward)   None      Subjective:  Patient seen and examined the bedside this morning.  Hemodynamically stable.  Alert, awake and oriented this morning.  Left foot pain is better.  Agrees with going to skilled nursing facility.  Objective: Vitals:   12/30/18 2122 12/31/18 0545 12/31/18 0700 12/31/18 1257  BP: (!) 171/69 (!) 165/71  135/69  Pulse: 77 69  79  Resp: 18 20  16   Temp: 98.1 F (36.7 C) 97.9 F (36.6 C)  98 F (36.7 C)  TempSrc: Oral Oral  Oral  SpO2: 100% 100%  99%  Weight:   86.8 kg   Height:        Intake/Output Summary (Last 24 hours) at 12/31/2018 1310 Last data filed at 12/31/2018 1051 Gross per 24 hour  Intake 1345.67 ml  Output 750 ml  Net 595.67 ml   Filed Weights   12/29/18 2300 12/30/18 0500 12/31/18 0700  Weight: 85.5 kg 85.3 kg 86.8 kg    Examination:  General exam: Alert,awake ,comfortable HEENT:PERRL,Ear/Nose normal on gross exam Respiratory system: Bilateral equal air entry, normal vesicular breath sounds, no wheezes or crackles  Cardiovascular system: S1 &  S2 heard, RRR. No JVD, murmurs, rubs, gallops or clicks. No pedal edema. Gastrointestinal system: Abdomen is nondistended, soft and nontender. No organomegaly or masses felt. Normal bowel sounds heard. Central nervous system: Alert, awake, oriented  extremities: No edema, no clubbing ,no cyanosis, distal peripheral pulses palpable. Tenderness on the left foot and ankle Skin: No rashes, lesions or ulcers,no icterus ,no pallor  Data  Reviewed: I have personally reviewed following labs and imaging studies  CBC: Recent Labs  Lab 12/29/18 1650 12/30/18 0433 12/31/18 0432  WBC 10.4 10.1 7.0  NEUTROABS 6.6  --  5.0  HGB 11.8* 11.1* 10.5*  HCT 33.6* 32.0* 30.1*  MCV 82.4 83.1 84.1  PLT 225 198 284*   Basic Metabolic Panel: Recent Labs  Lab 12/29/18 1650 12/29/18 2123 12/30/18 0050 12/30/18 0433 12/31/18 0432  NA 140  --   --  144  145 145  K 6.7* 6.2* 5.6* 4.8  4.8 4.0  CL 117*  --   --  117*  118* 119*  CO2 15*  --   --  17*  17* 16*  GLUCOSE 100*  --   --  110*  110* 168*  BUN 87*  --   --  78*  79* 78*  CREATININE 3.25*  --   --  2.95*  2.94* 2.85*  CALCIUM 7.9*  --   --  7.8*  7.9* 7.4*  PHOS  --   --   --  4.6 3.9   GFR: Estimated Creatinine Clearance: 25.3 mL/min (A) (by C-G formula based on SCr of 2.85 mg/dL (H)). Liver Function Tests: Recent Labs  Lab 12/30/18 0433 12/31/18 0432  AST 72*  --   ALT 41  --   ALKPHOS 110  --   BILITOT 4.1*  --   PROT 7.3  --   ALBUMIN 2.2*  2.2* 2.1*   No results for input(s): LIPASE, AMYLASE in the last 168 hours. Recent Labs  Lab 12/30/18 1200  AMMONIA 89*   Coagulation Profile: No results for input(s): INR, PROTIME in the last 168 hours. Cardiac Enzymes: Recent Labs  Lab 12/30/18 0433  CKTOTAL 601*   BNP (last 3 results) No results for input(s): PROBNP in the last 8760 hours. HbA1C: No results for input(s): HGBA1C in the last 72 hours. CBG: Recent Labs  Lab 12/29/18 1820 12/29/18 1917  GLUCAP 88 100*   Lipid Profile: No results for input(s): CHOL, HDL, LDLCALC, TRIG, CHOLHDL, LDLDIRECT in the last 72 hours. Thyroid Function Tests: No results for input(s): TSH, T4TOTAL, FREET4, T3FREE, THYROIDAB in the last 72 hours. Anemia Panel: No results for input(s): VITAMINB12, FOLATE, FERRITIN, TIBC, IRON, RETICCTPCT in the last 72 hours. Sepsis Labs: No results for input(s): PROCALCITON, LATICACIDVEN in the last 168 hours.  Recent  Results (from the past 240 hour(s))  SARS CORONAVIRUS 2 (TAT 6-24 HRS) Nasopharyngeal Nasopharyngeal Swab     Status: None   Collection Time: 12/29/18  5:58 PM   Specimen: Nasopharyngeal Swab  Result Value Ref Range Status   SARS Coronavirus 2 NEGATIVE NEGATIVE Final    Comment: (NOTE) SARS-CoV-2 target nucleic acids are NOT DETECTED. The SARS-CoV-2 RNA is generally detectable in upper and lower respiratory specimens during the acute phase of infection. Negative results do not preclude SARS-CoV-2 infection, do not rule out co-infections with other pathogens, and should not be used as the sole basis for treatment or other patient management decisions. Negative results must be combined with clinical observations, patient history, and epidemiological information. The  expected result is Negative. Fact Sheet for Patients: SugarRoll.be Fact Sheet for Healthcare Providers: https://www.woods-mathews.com/ This test is not yet approved or cleared by the [place] FDA and  has been authorized for detection and/or diagnosis of SARS-CoV-2 by FDA under an Emergency Use Authorization (EUA). This EUA will remain  in effect (meaning this test can be used) for the duration of the COVID-19 declaration under Section 56 4(b)(1) of the Act, 21 U.S.C. section 360bbb-3(b)(1), unless the authorization is terminated or revoked sooner. Performed at [facility], [place] [address], [place], [place] [address]          Radiology Studies: Dg Ankle Complete Left  Result Date: 12/29/2018 CLINICAL DATA:  Left foot and ankle pain, swelling and warmth to the touch. No known injury. History of gout. EXAM: LEFT ANKLE COMPLETE - 3+ VIEW COMPARISON:  None. FINDINGS: There is no evidence of fracture, dislocation, or joint effusion. Midfoot osteoarthritis and plantar calcaneal spur noted. Soft tissues are unremarkable. IMPRESSION: No acute abnormality. Electronically  Signed   By: Inge Rise M.D.   On: 12/29/2018 17:27   Ct Head Wo Contrast  Result Date: 12/30/2018 CLINICAL DATA:  Encephalopathy EXAM: CT HEAD WITHOUT CONTRAST TECHNIQUE: Contiguous axial images were obtained from the base of the skull through the vertex without intravenous contrast. COMPARISON:  CT head dated 10/30/2018.  MRI dated 10/31/2018. FINDINGS: Brain: No evidence of acute infarction, hemorrhage, hydrocephalus, extra-axial collection or mass lesion/mass effect. Vascular: No hyperdense vessel or unexpected calcification. Skull: Normal. Negative for fracture or focal lesion. There is a small metallic density within the soft tissues of the right face this is similar to prior study. Sinuses/Orbits: No acute finding. Other: None. IMPRESSION: No acute intracranial abnormality. Electronically Signed   By: Constance Holster M.D.   On: 12/30/2018 16:29   US Renal  Result Date: 12/30/2018 CLINICAL DATA:  Acute and chronic renal failure. EXAM: RENAL / URINARY TRACT ULTRASOUND COMPLETE COMPARISON:  MRI 10/18/2018. FINDINGS: Right Kidney: Renal measurements: 10.1 x 4.3 x 4.5 cm = volume: 11.5 mL . Echogenicity within normal limits. No mass or hydronephrosis visualized. Left Kidney: Renal measurements: 10.2 x 4.7 x 4.6 cm = volume: 115.0 mL. Echogenicity within normal limits. No mass or hydronephrosis visualized. Bladder: Appears normal for degree of bladder distention. Bilateral ureteral jets noted. Other: None. IMPRESSION: Negative exam. Electronically Signed   By: Marcello Moores  Register   On: 12/30/2018 07:05   Dg Foot Complete Left  Result Date: 12/29/2018 CLINICAL DATA:  Left foot and ankle pain, swelling and warmth to the touch. No known injury. History of gout. EXAM: LEFT FOOT - COMPLETE 3+ VIEW COMPARISON:  None. FINDINGS: There is no evidence of fracture or dislocation. Plantar calcaneal spur and midfoot osteoarthritis are noted. Bones are osteopenic. Soft tissues are unremarkable. IMPRESSION:  No acute abnormality. Plantar calcaneal spur. Midfoot osteoarthritis. Osteopenia. Electronically Signed   By: Inge Rise M.D.   On: 12/29/2018 17:26   Vas [place] Lower Extremity Venous (dvt) (only [facility])  Result Date: 12/30/2018  Lower Venous Study Indications: Edema.  Comparison Study: Prior study from 05/04/14 is available for comparison Performing Technologist: Sharion Dove RVS  Examination Guidelines: A complete evaluation includes B-mode imaging, spectral Doppler, color Doppler, and power Doppler as needed of all accessible portions of each vessel. Bilateral testing is considered an integral part of a complete examination. Limited examinations for reoccurring indications may be performed as noted.  +-----+---------------+---------+-----------+----------+--------------+ RIGHTCompressibilityPhasicitySpontaneityPropertiesThrombus Aging +-----+---------------+---------+-----------+----------+--------------+ CFV  Full  Yes      Yes                                 +-----+---------------+---------+-----------+----------+--------------+   +---------+---------------+---------+-----------+----------+--------------+ LEFT     CompressibilityPhasicitySpontaneityPropertiesThrombus Aging +---------+---------------+---------+-----------+----------+--------------+ CFV      Full           Yes      Yes                                 +---------+---------------+---------+-----------+----------+--------------+ SFJ      Full                                                        +---------+---------------+---------+-----------+----------+--------------+ FV Prox  Full                                                        +---------+---------------+---------+-----------+----------+--------------+ FV Mid   Full                                                        +---------+---------------+---------+-----------+----------+--------------+ FV DistalFull                                                         +---------+---------------+---------+-----------+----------+--------------+ PFV      Full                                                        +---------+---------------+---------+-----------+----------+--------------+ POP      Full           Yes      Yes                                 +---------+---------------+---------+-----------+----------+--------------+ PTV      Full                                                        +---------+---------------+---------+-----------+----------+--------------+ PERO     Full                                                        +---------+---------------+---------+-----------+----------+--------------+  Summary: Right: No evidence of common femoral vein obstruction. Left: Findings appear essentially unchanged compared to previous examination. There is no evidence of deep vein thrombosis in the lower extremity.  *See table(s) above for measurements and observations. Electronically signed by Ruta Hinds MD on 12/30/2018 at 3:45:40 PM.    Final         Scheduled Meds: . amLODipine  10 mg Oral Daily  . carvedilol  3.125 mg Oral BID WC  . cloNIDine  0.3 mg Transdermal Weekly  . feeding supplement (NEPRO CARB STEADY)  237 mL Oral Q24H  . feeding supplement (PRO-STAT SUGAR FREE 64)  30 mL Oral TID BM  . folic acid  1 mg Oral Daily  . heparin  5,000 Units Subcutaneous Q8H  . levothyroxine  50 mcg Oral QAC breakfast  . pantoprazole  40 mg Oral Daily  . predniSONE  50 mg Oral Q breakfast  . thiamine  100 mg Oral Daily   Continuous Infusions: . dextrose 5 % and 0.9% NaCl 75 mL/hr at 12/30/18 1327     LOS: 2 days    Time spent: 25 mins.More than 50% of that time was spent in counseling and/or coordination of care.      Shelly Coss, MD [facility] Pager [phone]  If 7PM-7AM, please contact night-coverage [email or web address] Password [email or web address] 12/31/2018, 1:10 PM

## 2018-12-31 NOTE — TOC Progression Note (Signed)
Transition of Care [facility]) - Progression Note    Patient Details  Name: Bruce Hayes MRN: [id number] Date of Birth: 1945/10/11  Transition of Care [facility]) CM/SW Contact  Elona Yinger, Juliann Pulse, RN Phone Number: 12/31/2018, 12:41 PM  Clinical Narrative:   [name]-rep Freda Munro will start auth, & patient's choice. They prefer Nephro vs prostat-RD notified.    Expected Discharge Plan: Skilled Nursing Facility Barriers to Discharge: Continued Medical Work up(facility will start auth;ordered covid.)  Expected Discharge Plan and Services Expected Discharge Plan: [place]   Discharge Planning Services: CM Consult   Living arrangements for the past 2 months: [place]                                       Social Determinants of Health (SDOH) Interventions    Readmission Risk Interventions Readmission Risk Prevention Plan 12/30/2018 11/02/2018  Transportation Screening Complete Complete  PCP or Specialist Appt within 3-5 Days - Not Complete  Not Complete comments - plan for SNF  HRI or [place] - Not Complete  HRI or Home Care Consult comments - plan for SNF  Social Work Consult for [place] Planning/Counseling - Complete  Palliative Care Screening - Not Applicable  Medication Review (RN Care Manager) Complete Complete  PCP or Specialist appointment within 3-5 days of discharge Not Complete -  PCP/Specialist Appt Not Complete comments Patient will make own pcp appt;not ready for d/c yet. -  HRI or Home Care Consult Complete -  SW Recovery Care/Counseling Consult Complete -  Palliative Care Screening Not Applicable -  [place] Not Applicable -  Some recent data might be hidden

## 2018-12-31 NOTE — TOC Progression Note (Signed)
Transition of Care [facility]) - Progression Note    Patient Details  Name: Bruce Hayes MRN: [id number] Date of Birth: July 03, 1945  Transition of Care [facility]) CM/SW Contact  Kairon Shock, Juliann Pulse, RN Phone Number: 12/31/2018, 9:17 AM  Clinical Narrative:   12/31/2018 Medicare Nursing Home Results [email or web address] 1/4 Close window 11 hospitals within 25 miles from the center of [place], [place]. Nursing Home Search Results Results List Table Nursing Home Information Overall Rating Health Inpections Staffing Quality Ratings [facility] [place], [place] [address] [phone] Much Below Average Much Below Average Not Available12 Above Average [place], [place] [address] [phone] Below Average Below Average Average Above Average [facility] [place], [place] [address] [phone] Much Below Average Much Below Average Below Average Below Average 1 out of 5 stars 1 out of 5 stars 4 out of 5 stars 2 out of 5 stars 2 out of 5 stars 3 out of 5 stars 4 out of 5 stars 1 out of 5 stars 1 out of 5 stars 2 out of 5 stars 2 out of 5 stars 12/31/2018 Medicare Nursing Home Results [email or web address] 2/4 [place] Staffing Quality Ratings [place], [place] [address] [phone] Much Below Average Much Below Average Below Average Average [facility] & [address] [place], [place] [address] [phone] Below Average Below Average Below Average Below Average [place], [place] [address] [phone] Much Below Average Much Below Average Below Average Average [place], [place] [address] [phone] Above Average Above Average Below Average Above Average [place], [place] [address] [phone] Much Above Average Much Above Average Above Average Above Average 1 out of 5 stars 1 out of 5 stars 2 out of 5 stars 3 out of 5 stars 2 out of 5 stars 2 out of 5 stars 2 out of 5 stars 2 out of 5 stars 1 out of 5 stars 1 out of 5 stars 2 out of 5 stars 3 out of 5 stars 4 out of 5 stars 4 out of 5 stars 2 out of 5 stars 4 out of 5 stars 5 out of 5 stars 5 out of 5 stars 4 out of 5 stars 4 out of 5 stars 12/31/2018 Medicare Nursing Home Results [email or web address] 3/4 Nursing Home Information Overall Rating Health Inpections Staffing Quality Ratings [facility] [address] [place], [place] [address] [phone] Much Below Average Much Below Average Much Below Average Below Average [facility] [address] [place], [place] [address] [phone] Much Above Average Above Average Above Average Much Above Average [facility] [place], [place] [address] [phone] Below Average Below Average Average Above Average NursingHomeCompare Footnotes Footnote number Footnote as displayed on nursing home Compare 1 Newly certified nursing home with less than 12-15 months of data available or the nursing opened less than 6 months ago, and there were no data to submit or claims for this measure. 2 Not enough data available to calculate a star rating. 6 This facility did not submit staffing data, or submitted data that did not meet the criteria  required to calculate a staffing measure. 7 CMS determined that the percentage was not  accurate or data suppressed by CMS for one or more quarters. 9 The number of residents or resident stays is too small to report. Call the facility to discuss this quality measure. 10 The data for this measure is missing or was not submitted. Call the facility to discuss this quality measure. 1 out of 5 stars 1 out of 5 stars 1 out of 5 stars 2 out of 5 stars 5 out of 5 stars 4 out of 5 stars 4 out of 5 stars 5 out of 5 stars 2 out of 5 stars 2 out of 5 stars 3 out of 5 stars 4 out of 5 stars 12/31/2018 Medicare Nursing Home Results [email or web address]&[email or web address] 4/4 Footnote number Footnote as displayed on nursing home Compare 12 This facility either did not submit staffing data, has reported a high number of days without a registered nurse onsite, or submitted data that could not be verified through an audit. 13 Results are based on a shorter time period than required. 14 This nursing home is not required to submit data for the [place] Reporting Program. 18 This facility is not rated due to a history of serious quality issues and is included in the special focus facility program.    Expected Discharge Plan: Skilled Nursing Facility Barriers to Discharge: Continued Medical Work up  Expected Discharge Plan and Services Expected Discharge Plan: [place]   Discharge Planning Services: CM Consult   Living arrangements for the past 2 months: [place]                                       Social Determinants of Health (SDOH) Interventions    Readmission Risk Interventions Readmission Risk Prevention Plan 12/30/2018 11/02/2018  Transportation Screening Complete Complete  PCP or Specialist Appt within 3-5 Days - Not Complete   Not Complete comments - plan for SNF  HRI or [place] - Not Complete  HRI or Home Care Consult comments - plan for SNF  Social Work Consult for [place] Planning/Counseling - Complete  Palliative Care Screening - Not Applicable  Medication Review (RN Care Manager) Complete Complete  PCP or Specialist appointment within 3-5 days of discharge Not Complete -  PCP/Specialist Appt Not Complete comments Patient will make own pcp appt;not ready for d/c yet. -  HRI or Home Care Consult Complete -  SW Recovery Care/Counseling Consult Complete -  Palliative Care Screening Not Applicable -  [place] Not Applicable -  Some recent data might be hidden

## 2019-01-01 LAB — RENAL FUNCTION PANEL
Albumin: 2.1 g/dL — ABNORMAL LOW (ref 3.5–5.0)
Anion gap: 8 (ref 5–15)
BUN: 87 mg/dL — ABNORMAL HIGH (ref 8–23)
CO2: 17 mmol/L — ABNORMAL LOW (ref 22–32)
Calcium: 7.4 mg/dL — ABNORMAL LOW (ref 8.9–10.3)
Chloride: 120 mmol/L — ABNORMAL HIGH (ref 98–111)
Creatinine, Ser: 2.34 mg/dL — ABNORMAL HIGH (ref 0.61–1.24)
GFR calc Af Amer: 31 mL/min — ABNORMAL LOW (ref >60)
GFR calc non Af Amer: 27 mL/min — ABNORMAL LOW (ref >60)
Glucose, Bld: 110 mg/dL — ABNORMAL HIGH (ref 70–99)
Phosphorus: 3.4 mg/dL (ref 2.5–4.6)
Potassium: 4.1 mmol/L (ref 3.5–5.1)
Sodium: 145 mmol/L (ref 135–145)

## 2019-01-01 LAB — SARS CORONAVIRUS 2 (TAT 6-24 HRS): SARS Coronavirus 2: NEGATIVE

## 2019-01-01 MED ORDER — OXYCODONE-ACETAMINOPHEN 5-325 MG PO TABS: 1.0000 | ORAL_TABLET | ORAL | 0 refills | Status: DC | PRN

## 2019-01-01 MED ORDER — PREDNISONE 10 MG PO TABS: ORAL_TABLET | ORAL | Status: DC

## 2019-01-01 NOTE — TOC Progression Note (Signed)
Transition of Care [facility]) - Progression Note    Patient Details  Name: RAY GERVASI MRN: [id number] Date of Birth: 05/31/1945  Transition [facility] Care [facility]) CM/SW Contact  Louann Hopson, Juliann Pulse, RN Phone Number: 01/01/2019, 11:57 AM  Clinical Narrative:  [facility] requested additional info for auth-faxed w/confirmation updated notes, & d/c summary-await auth.     Expected Discharge Plan: Skilled Nursing Facility Barriers to Discharge: Insurance Authorization  Expected Discharge Plan and Services Expected Discharge Plan: [place]   Discharge Planning Services: CM Consult   Living arrangements for the past 2 months: [place] Expected Discharge Date: 01/01/19                                     Social Determinants of Health (SDOH) Interventions    Readmission Risk Interventions Readmission Risk Prevention Plan 12/30/2018 11/02/2018  Transportation Screening Complete Complete  PCP or Specialist Appt within 3-5 Days - Not Complete  Not Complete comments - plan for SNF  HRI or [place] - Not Complete  HRI or Home Care Consult comments - plan for SNF  Social Work Consult for [place] Planning/Counseling - Complete  Palliative Care Screening - Not Applicable  Medication Review (RN Care Manager) Complete Complete  PCP or Specialist appointment within 3-5 days of discharge Not Complete -  PCP/Specialist Appt Not Complete comments Patient will make own pcp appt;not ready for d/c yet. -  HRI or Home Care Consult Complete -  SW Recovery Care/Counseling Consult Complete -  Palliative Care Screening Not Applicable -  [place] Not Applicable -  Some recent data might be hidden

## 2019-01-01 NOTE — Care Management Important Message (Signed)
Important Message  Patient Details IM Letter given to Dessa Phi RN to present to the Patient Name: Bruce Hayes MRN: [id number] Date of Birth: 1946/01/17   Medicare Important Message Given:  Yes     Kerin Salen 01/01/2019, 10:56 AM

## 2019-01-01 NOTE — TOC Progression Note (Signed)
Transition of Care [facility]) - Progression Note    Patient Details  Name: Bruce Hayes MRN: [id number] Date of Birth: [birth year]  Transition [facility] CM/SW Contact  Neri Samek, Juliann Pulse, RN Phone Number: 01/01/2019, 10:22 AM  Clinical Narrative: [name] accepted,medically stable waiting on insurance auth per facility rep Freda Munro.      Expected Discharge Plan: Skilled Nursing Facility Barriers to Discharge: Insurance Authorization  Expected Discharge Plan and Services Expected Discharge Plan: [place]   Discharge Planning Services: CM Consult   Living arrangements for the past 2 months: [place] Expected Discharge Date: 01/01/19                                     Social Determinants of Health (SDOH) Interventions    Readmission Risk Interventions Readmission Risk Prevention Plan 12/30/2018 11/02/2018  Transportation Screening Complete Complete  PCP or Specialist Appt within 3-5 Days - Not Complete  Not Complete comments - plan for SNF  HRI or [place] - Not Complete  HRI or Home Care Consult comments - plan for SNF  Social Work Consult for [place] Planning/Counseling - Complete  Palliative Care Screening - Not Applicable  Medication Review (RN Care Manager) Complete Complete  PCP or Specialist appointment within 3-5 days of discharge Not Complete -  PCP/Specialist Appt Not Complete comments Patient will make own pcp appt;not ready for d/c yet. -  HRI or Home Care Consult Complete -  SW Recovery Care/Counseling Consult Complete -  Palliative Care Screening Not Applicable -  [place] Not Applicable -  Some recent data might be hidden

## 2019-01-01 NOTE — Discharge Summary (Addendum)
Physician Discharge Summary  LIEUTENANT ABARCA QMV:[id number] DOB: 05-23-1945 DOA: 12/29/2018  PCP: Lorene Dy, MD  Admit date: 12/29/2018 Discharge date: 01/02/19 Admitted From: Home Disposition: SNF  Discharge Condition:Stable CODE STATUS:FULL Diet recommendation: Heart Healthy  Brief/Interim Summary: Patient is a 73 year old male with history of CKD stage III, liver cirrhosis/hepatitis C, alcohol abuse, gout, coronary disease, hypertension who presents with complaint of pain on the left foot.  He  has history of gout.  On presentation, his left ankle was swollen and tender.  He was also found to have potassium of 6.7, creatinine of 3.2.  Nephrology was consulted.  Started on steroids for gout.  Patient seen by physical therapy and recommended SNF on discharge.  He is kidney function is improving.  Left foot pain improving with the steroids.  He is hemodynamically stable for discharge to skilled nursing facility.  He needs to follow-up with nephrology and rheumatology on outpatient.  Following problems were addressed during his hospitalization:  Acute on chronic CKD stage III: Baseline creatinine of 1.7.  Presented with creatinine more than 3.  Also found to be hyperkalemic.  Started on IV fluids, Kayexalate.  Nephrology following.  Kidney function improving .  He is having good urine output.  Ultrasound kidney did not show any abnormalities.  Gout/Left foot pain: Most likely secondary to gouty arthritis .Xray did not  any fracture /dislocation.  He was given a dose of IV Solu-Medrol.  We will continue prednisone.  Uric acid level elevated.  Hypertensive urgency: Continue current medicines.  Blood pressure more controlled now.  On clonidine patch, Coreg, amlodipine  History of liver cirrhosis/history of hepatitis C/chronic alcoholism: Denies any alcohol intake for last 1 month.  Monitor for withdrawal.  On lactulose at home.  Coronary artery disease: Denies any chest pain.  Continue  current meds  Altered mental status:  CT head did not show any acute intracranial abnormalities.  Currently he is alert and oriented.Ammonia level normal  Difficulty ambulation: Uses cane at baseline.  Complains of left foot pain.   PT/OT evaluation recommended SNF  Protein calorie malnutrition: He can continue  Nepro  Discharge Diagnoses:  Principal Problem:   Acute on chronic renal failure (HCC) Active Problems:   CKD (chronic kidney disease), stage III   Hypertensive urgency    Discharge Instructions  Discharge Instructions    Diet - low sodium heart healthy   Complete by: As directed    Discharge instructions   Complete by: As directed    1)Do a CBC and BMP tests in a week. 2)Follow up with nephrology as an outpatient.Name and number of the provider has been attached. 3)Follow up with rheumatology as an outpatient.   Increase activity slowly   Complete by: As directed      Allergies as of 01/01/2019      Reactions   Tylenol [acetaminophen] Other (See Comments)   Liver condition      Medication List    TAKE these medications   amLODipine 10 MG tablet Commonly known as: NORVASC Take 1 tablet (10 mg total) by mouth daily.   carvedilol 3.125 MG tablet Commonly known as: COREG Take 1 tablet (3.125 mg total) by mouth 2 (two) times daily with a meal.   cloNIDine 0.3 mg/24hr patch Commonly known as: CATAPRES - Dosed in mg/24 hr Place 1 patch (0.3 mg total) onto the skin once a week.   folic acid 1 MG tablet Commonly known as: FOLVITE Take 1 tablet (1 mg total) by mouth  daily.   lactulose 10 GM/15ML solution Commonly known as: CHRONULAC Take 30 mLs (20 g total) by mouth 2 (two) times daily.   levothyroxine 50 MCG tablet Commonly known as: SYNTHROID Take 50 mcg by mouth daily before breakfast.   omeprazole 20 MG capsule Commonly known as: PRILOSEC TAKE ONE CAPSULE BY MOUTH TWICE DAILY BEFORE A MEAL FOR 14 DAYS What changed: See the new instructions.    oxyCODONE-acetaminophen 5-325 MG tablet Commonly known as: Percocet Take 1 tablet by mouth every 4 (four) hours as needed for severe pain.   predniSONE 10 MG tablet Commonly known as: DELTASONE Take 4 pills daily for 3 days then 3 pills daily for 3 days then 2 pills daily for 3 days then continue to take 1pill  daily Start taking on: January 02, 2019   sertraline 25 MG tablet Commonly known as: ZOLOFT Take 25 mg by mouth daily.   thiamine 100 MG tablet Take 1 tablet (100 mg total) by mouth daily.      Contact information for after-discharge care    [facility] .   Service: Skilled Nursing Contact information: [place] [address] [phone]             Allergies  Allergen Reactions  . Tylenol [Acetaminophen] Other (See Comments)    Liver condition    Consultations:  Nephrology   Procedures/Studies: Dg Ankle Complete Left  Result Date: 12/29/2018 CLINICAL DATA:  Left foot and ankle pain, swelling and warmth to the touch. No known injury. History of gout. EXAM: LEFT ANKLE COMPLETE - 3+ VIEW COMPARISON:  None. FINDINGS: There is no evidence of fracture, dislocation, or joint effusion. Midfoot osteoarthritis and plantar calcaneal spur noted. Soft tissues are unremarkable. IMPRESSION: No acute abnormality. Electronically Signed   By: Inge Rise M.D.   On: 12/29/2018 17:27   Ct Head Wo Contrast  Result Date: 12/30/2018 CLINICAL DATA:  Encephalopathy EXAM: CT HEAD WITHOUT CONTRAST TECHNIQUE: Contiguous axial images were obtained from the base of the skull through the vertex without intravenous contrast. COMPARISON:  CT head dated 10/30/2018.  MRI dated 10/31/2018. FINDINGS: Brain: No evidence of acute infarction, hemorrhage, hydrocephalus, extra-axial collection or mass lesion/mass effect. Vascular: No hyperdense vessel or unexpected calcification. Skull: Normal. Negative for fracture or focal lesion. There  is a small metallic density within the soft tissues of the right face this is similar to prior study. Sinuses/Orbits: No acute finding. Other: None. IMPRESSION: No acute intracranial abnormality. Electronically Signed   By: Constance Holster M.D.   On: 12/30/2018 16:29   US Renal  Result Date: 12/30/2018 CLINICAL DATA:  Acute and chronic renal failure. EXAM: RENAL / URINARY TRACT ULTRASOUND COMPLETE COMPARISON:  MRI 10/18/2018. FINDINGS: Right Kidney: Renal measurements: 10.1 x 4.3 x 4.5 cm = volume: 11.5 mL . Echogenicity within normal limits. No mass or hydronephrosis visualized. Left Kidney: Renal measurements: 10.2 x 4.7 x 4.6 cm = volume: 115.0 mL. Echogenicity within normal limits. No mass or hydronephrosis visualized. Bladder: Appears normal for degree of bladder distention. Bilateral ureteral jets noted. Other: None. IMPRESSION: Negative exam. Electronically Signed   By: Marcello Moores  Register   On: 12/30/2018 07:05   Dg Foot Complete Left  Result Date: 12/29/2018 CLINICAL DATA:  Left foot and ankle pain, swelling and warmth to the touch. No known injury. History of gout. EXAM: LEFT FOOT - COMPLETE 3+ VIEW COMPARISON:  None. FINDINGS: There is no evidence of fracture or dislocation. Plantar  calcaneal spur and midfoot osteoarthritis are noted. Bones are osteopenic. Soft tissues are unremarkable. IMPRESSION: No acute abnormality. Plantar calcaneal spur. Midfoot osteoarthritis. Osteopenia. Electronically Signed   By: Inge Rise M.D.   On: 12/29/2018 17:26   Vas [place] Lower Extremity Venous (dvt) (only [facility])  Result Date: 12/30/2018  Lower Venous Study Indications: Edema.  Comparison Study: Prior study from 05/04/14 is available for comparison Performing Technologist: Sharion Dove RVS  Examination Guidelines: A complete evaluation includes B-mode imaging, spectral Doppler, color Doppler, and power Doppler as needed of all accessible portions of each vessel. Bilateral testing is considered an  integral part of a complete examination. Limited examinations for reoccurring indications may be performed as noted.  +-----+---------------+---------+-----------+----------+--------------+ RIGHTCompressibilityPhasicitySpontaneityPropertiesThrombus Aging +-----+---------------+---------+-----------+----------+--------------+ CFV  Full           Yes      Yes                                 +-----+---------------+---------+-----------+----------+--------------+   +---------+---------------+---------+-----------+----------+--------------+ LEFT     CompressibilityPhasicitySpontaneityPropertiesThrombus Aging +---------+---------------+---------+-----------+----------+--------------+ CFV      Full           Yes      Yes                                 +---------+---------------+---------+-----------+----------+--------------+ SFJ      Full                                                        +---------+---------------+---------+-----------+----------+--------------+ FV Prox  Full                                                        +---------+---------------+---------+-----------+----------+--------------+ FV Mid   Full                                                        +---------+---------------+---------+-----------+----------+--------------+ FV DistalFull                                                        +---------+---------------+---------+-----------+----------+--------------+ PFV      Full                                                        +---------+---------------+---------+-----------+----------+--------------+ POP      Full           Yes      Yes                                 +---------+---------------+---------+-----------+----------+--------------+  PTV      Full                                                        +---------+---------------+---------+-----------+----------+--------------+ PERO     Full                                                         +---------+---------------+---------+-----------+----------+--------------+     Summary: Right: No evidence of common femoral vein obstruction. Left: Findings appear essentially unchanged compared to previous examination. There is no evidence of deep vein thrombosis in the lower extremity.  *See table(s) above for measurements and observations. Electronically signed by Ruta Hinds MD on 12/30/2018 at 3:45:40 PM.    Final        Subjective: Patient seen and examined the bedside this morning.  Hemodynamically stable for discharge.  Discharge Exam: Vitals:   12/31/18 2037 01/01/19 0443  BP: (!) 153/62 (!) 154/73  Pulse: 74 78  Resp: 17 20  Temp: 97.7 F (36.5 C) 98.4 F (36.9 C)  SpO2: 98% 100%   Vitals:   12/31/18 0700 12/31/18 1257 12/31/18 2037 01/01/19 0443  BP:  135/69 (!) 153/62 (!) 154/73  Pulse:  79 74 78  Resp:  16 17 20   Temp:  98 F (36.7 C) 97.7 F (36.5 C) 98.4 F (36.9 C)  TempSrc:  Oral    SpO2:  99% 98% 100%  Weight: 86.8 kg   87.3 kg  Height:        General: Pt is alert, awake, not in acute distress Cardiovascular: RRR, S1/S2 +, no rubs, no gallops Respiratory: CTA bilaterally, no wheezing, no rhonchi Abdominal: Soft, NT, ND, bowel sounds + Extremities: no edema, no cyanosis    The results of significant diagnostics from this hospitalization (including imaging, microbiology, ancillary and laboratory) are listed below for reference.     Microbiology: Recent Results (from the past 240 hour(s))  SARS CORONAVIRUS 2 (TAT 6-24 HRS) Nasopharyngeal Nasopharyngeal Swab     Status: None   Collection Time: 12/29/18  5:58 PM   Specimen: Nasopharyngeal Swab  Result Value Ref Range Status   SARS Coronavirus 2 NEGATIVE NEGATIVE Final    Comment: (NOTE) SARS-CoV-2 target nucleic acids are NOT DETECTED. The SARS-CoV-2 RNA is generally detectable in upper and lower respiratory specimens during the acute phase of  infection. Negative results do not preclude SARS-CoV-2 infection, do not rule out co-infections with other pathogens, and should not be used as the sole basis for treatment or other patient management decisions. Negative results must be combined with clinical observations, patient history, and epidemiological information. The expected result is Negative. Fact Sheet for Patients: SugarRoll.be Fact Sheet for Healthcare Providers: https://www.woods-mathews.com/ This test is not yet approved or cleared by the [place] FDA and  has been authorized for detection and/or diagnosis of SARS-CoV-2 by FDA under an Emergency Use Authorization (EUA). This EUA will remain  in effect (meaning this test can be used) for the duration of the COVID-19 declaration under Section 56 4(b)(1) of the Act, 21 U.S.C. section 360bbb-3(b)(1), unless the authorization is terminated or revoked sooner. Performed at [facility],  [address], [place], [place] [address]   SARS CORONAVIRUS 2 (TAT 6-24 HRS) Nasopharyngeal Nasopharyngeal Swab     Status: None   Collection Time: 12/31/18  3:18 PM   Specimen: Nasopharyngeal Swab  Result Value Ref Range Status   SARS Coronavirus 2 NEGATIVE NEGATIVE Final    Comment: (NOTE) SARS-CoV-2 target nucleic acids are NOT DETECTED. The SARS-CoV-2 RNA is generally detectable in upper and lower respiratory specimens during the acute phase of infection. Negative results do not preclude SARS-CoV-2 infection, do not rule out co-infections with other pathogens, and should not be used as the sole basis for treatment or other patient management decisions. Negative results must be combined with clinical observations, patient history, and epidemiological information. The expected result is Negative. Fact Sheet for Patients: SugarRoll.be Fact Sheet for Healthcare  Providers: https://www.woods-mathews.com/ This test is not yet approved or cleared by the [place] FDA and  has been authorized for detection and/or diagnosis of SARS-CoV-2 by FDA under an Emergency Use Authorization (EUA). This EUA will remain  in effect (meaning this test can be used) for the duration of the COVID-19 declaration under Section 56 4(b)(1) of the Act, 21 U.S.C. section 360bbb-3(b)(1), unless the authorization is terminated or revoked sooner. Performed at [facility], [place] [address], [place], [place] [address]      Labs: BNP (last 3 results) No results for input(s): BNP in the last 8760 hours. Basic Metabolic Panel: Recent Labs  Lab 12/29/18 1650 12/29/18 2123 12/30/18 0050 12/30/18 0433 12/31/18 0432 01/01/19 0440  NA 140  --   --  144  145 145 145  K 6.7* 6.2* 5.6* 4.8  4.8 4.0 4.1  CL 117*  --   --  117*  118* 119* 120*  CO2 15*  --   --  17*  17* 16* 17*  GLUCOSE 100*  --   --  110*  110* 168* 110*  BUN 87*  --   --  78*  79* 78* 87*  CREATININE 3.25*  --   --  2.95*  2.94* 2.85* 2.34*  CALCIUM 7.9*  --   --  7.8*  7.9* 7.4* 7.4*  PHOS  --   --   --  4.6 3.9 3.4   Liver Function Tests: Recent Labs  Lab 12/30/18 0433 12/31/18 0432 01/01/19 0440  AST 72*  --   --   ALT 41  --   --   ALKPHOS 110  --   --   BILITOT 4.1*  --   --   PROT 7.3  --   --   ALBUMIN 2.2*  2.2* 2.1* 2.1*   No results for input(s): LIPASE, AMYLASE in the last 168 hours. Recent Labs  Lab 12/30/18 1200  AMMONIA 89*   CBC: Recent Labs  Lab 12/29/18 1650 12/30/18 0433 12/31/18 0432  WBC 10.4 10.1 7.0  NEUTROABS 6.6  --  5.0  HGB 11.8* 11.1* 10.5*  HCT 33.6* 32.0* 30.1*  MCV 82.4 83.1 84.1  PLT 225 198 136*   Cardiac Enzymes: Recent Labs  Lab 12/30/18 0433  CKTOTAL 601*   BNP: Invalid input(s): POCBNP CBG: Recent Labs  Lab 12/29/18 1820 12/29/18 1917  GLUCAP 88 100*   D-Dimer No results for input(s): DDIMER in the  last 72 hours. Hgb A1c No results for input(s): HGBA1C in the last 72 hours. Lipid Profile No results for input(s): CHOL, HDL, LDLCALC, TRIG, CHOLHDL, LDLDIRECT in the last 72 hours. Thyroid function studies No  results for input(s): TSH, T4TOTAL, T3FREE, THYROIDAB in the last 72 hours.  Invalid input(s): FREET3 Anemia work up No results for input(s): VITAMINB12, FOLATE, FERRITIN, TIBC, IRON, RETICCTPCT in the last 72 hours. Urinalysis    Component Value Date/Time   COLORURINE AMBER (A) 12/29/2018 1759   APPEARANCEUR CLEAR 12/29/2018 1759   LABSPEC 1.016 12/29/2018 1759   PHURINE 5.0 12/29/2018 1759   GLUCOSEU NEGATIVE 12/29/2018 1759   HGBUR NEGATIVE 12/29/2018 1759   BILIRUBINUR NEGATIVE 12/29/2018 1759   KETONESUR NEGATIVE 12/29/2018 1759   PROTEINUR 30 (A) 12/29/2018 1759   UROBILINOGEN 4.0 (H) 03/31/2014 0920   NITRITE NEGATIVE 12/29/2018 1759   LEUKOCYTESUR TRACE (A) 12/29/2018 1759   Sepsis Labs Invalid input(s): PROCALCITONIN,  WBC,  LACTICIDVEN Microbiology Recent Results (from the past 240 hour(s))  SARS CORONAVIRUS 2 (TAT 6-24 HRS) Nasopharyngeal Nasopharyngeal Swab     Status: None   Collection Time: 12/29/18  5:58 PM   Specimen: Nasopharyngeal Swab  Result Value Ref Range Status   SARS Coronavirus 2 NEGATIVE NEGATIVE Final    Comment: (NOTE) SARS-CoV-2 target nucleic acids are NOT DETECTED. The SARS-CoV-2 RNA is generally detectable in upper and lower respiratory specimens during the acute phase of infection. Negative results do not preclude SARS-CoV-2 infection, do not rule out co-infections with other pathogens, and should not be used as the sole basis for treatment or other patient management decisions. Negative results must be combined with clinical observations, patient history, and epidemiological information. The expected result is Negative. Fact Sheet for Patients: SugarRoll.be Fact Sheet for Healthcare  Providers: https://www.woods-mathews.com/ This test is not yet approved or cleared by the [place] FDA and  has been authorized for detection and/or diagnosis of SARS-CoV-2 by FDA under an Emergency Use Authorization (EUA). This EUA will remain  in effect (meaning this test can be used) for the duration of the COVID-19 declaration under Section 56 4(b)(1) of the Act, 21 U.S.C. section 360bbb-3(b)(1), unless the authorization is terminated or revoked sooner. Performed at [facility], [place] [address], [place], [place] [address]   SARS CORONAVIRUS 2 (TAT 6-24 HRS) Nasopharyngeal Nasopharyngeal Swab     Status: None   Collection Time: 12/31/18  3:18 PM   Specimen: Nasopharyngeal Swab  Result Value Ref Range Status   SARS Coronavirus 2 NEGATIVE NEGATIVE Final    Comment: (NOTE) SARS-CoV-2 target nucleic acids are NOT DETECTED. The SARS-CoV-2 RNA is generally detectable in upper and lower respiratory specimens during the acute phase of infection. Negative results do not preclude SARS-CoV-2 infection, do not rule out co-infections with other pathogens, and should not be used as the sole basis for treatment or other patient management decisions. Negative results must be combined with clinical observations, patient history, and epidemiological information. The expected result is Negative. Fact Sheet for Patients: SugarRoll.be Fact Sheet for Healthcare Providers: https://www.woods-mathews.com/ This test is not yet approved or cleared by the [place] FDA and  has been authorized for detection and/or diagnosis of SARS-CoV-2 by FDA under an Emergency Use Authorization (EUA). This EUA will remain  in effect (meaning this test can be used) for the duration of the COVID-19 declaration under Section 56 4(b)(1) of the Act, 21 U.S.C. section 360bbb-3(b)(1), unless the authorization is terminated or revoked sooner. Performed at  [facility], [place] [address], [place], [place] [address]     Please note: You were cared for by a hospitalist during your hospital stay. Once you are discharged, your primary care physician will handle  any further medical issues. Please note that NO REFILLS for any discharge medications will be authorized once you are discharged, as it is imperative that you return to your primary care physician (or establish a relationship with a primary care physician if you do not have one) for your post hospital discharge needs so that they can reassess your need for medications and monitor your lab values.    Time coordinating discharge: 40 minutes  SIGNED:   Shelly Coss, MD  [facility] 01/01/2019, 10:11 AM Pager [phone]  If 7PM-7AM, please contact night-coverage [email or web address] Password [email or web address]

## 2019-01-01 NOTE — TOC Progression Note (Signed)
Transition of Care [facility]) - Progression Note    Patient Details  Name: Bruce Hayes MRN: [id number] Date of Birth: 1945/04/09  Transition of Care [facility] CM/SW Contact  Toran Murch, Juliann Pulse, RN Phone Number: 01/01/2019, 2:30 PM  Clinical Narrative:   Received call directly from [facility] since they were unable to get all info from [facility] has faxed H&P,d/c summary,MD notes,PT/OT eval notes with confirmation. Awaiting auth.    Expected Discharge Plan: Skilled Nursing Facility Barriers to Discharge: Insurance Authorization  Expected Discharge Plan and Services Expected Discharge Plan: [place]   Discharge Planning Services: CM Consult   Living arrangements for the past 2 months: [place] Expected Discharge Date: 01/01/19                                     Social Determinants of Health (SDOH) Interventions    Readmission Risk Interventions Readmission Risk Prevention Plan 12/30/2018 11/02/2018  Transportation Screening Complete Complete  PCP or Specialist Appt within 3-5 Days - Not Complete  Not Complete comments - plan for SNF  HRI or [place] - Not Complete  HRI or Home Care Consult comments - plan for SNF  Social Work Consult for [place] Planning/Counseling - Complete  Palliative Care Screening - Not Applicable  Medication Review (RN Care Manager) Complete Complete  PCP or Specialist appointment within 3-5 days of discharge Not Complete -  PCP/Specialist Appt Not Complete comments Patient will make own pcp appt;not ready for d/c yet. -  HRI or Home Care Consult Complete -  SW Recovery Care/Counseling Consult Complete -  Palliative Care Screening Not Applicable -  [place] Not Applicable -  Some recent data might be hidden

## 2019-01-02 NOTE — Progress Notes (Addendum)
Patient seen and examined at  the bedside this morning.  Hemodynamically stable.  No new changes.  No change in the management plan.  Patient is medically stable for discharge to skilled nursing facility today.

## 2019-01-02 NOTE — TOC Transition Note (Signed)
Transition of Care [facility]) - CM/SW Discharge Note   Patient Details  Name: Bruce Hayes MRN: [id number] Date of Birth: [birth year]  Transition of Care [facility]) CM/SW Contact:  Dessa Phi, RN Phone Number: 01/02/2019, 11:07 AM   Clinical Narrative: d/c today [name] bed-going to rm 502W,nurse call report tel#[phone]. PTAR called.      Final next level of care: Skilled Nursing Facility Barriers to Discharge: No Barriers Identified   Patient Goals and CMS Choice Patient states their goals for this hospitalization and ongoing recovery are:: go home      Discharge Placement   Existing PASRR number confirmed : 12/31/18          Patient chooses bed at: [facility] Patient to be transferred to facility by: [place] Name of family member notified: patient Patient and family notified of of transfer: 01/02/19  Discharge Plan and Services   Discharge Planning Services: CM Consult                                 Social Determinants of Health ([place]) Interventions     Readmission Risk Interventions Readmission Risk Prevention Plan 12/30/2018 11/02/2018  Transportation Screening Complete Complete  PCP or Specialist Appt within 3-5 Days - Not Complete  Not Complete comments - plan for SNF  HRI or B and E - Not Complete  HRI or Home Care Consult comments - plan for SNF  Social Work Consult for [place] Planning/Counseling - Complete  Palliative Care Screening - Not Applicable  Medication Review (RN Care Manager) Complete Complete  PCP or Specialist appointment within 3-5 days of discharge Not Complete -  PCP/Specialist Appt Not Complete comments Patient will make own pcp appt;not ready for d/c yet. -  HRI or Home Care Consult Complete -  SW Recovery Care/Counseling Consult Complete -  Palliative Care Screening Not Applicable -  [place] Not Applicable -  Some recent data might be hidden

## 2019-01-02 NOTE — TOC Transition Note (Signed)
Transition of Care [facility]) - CM/SW Discharge Note   Patient Details  Name: Bruce Hayes MRN: [id number] Date of Birth: [birth year]  Transition of Care [facility]) CM/SW Contact:  Dessa Phi, RN Phone Number: 01/02/2019, 9:51 AM   Clinical Narrative: Josem Kaufmann received yesterday-[facility] rep [facility] unable to accept after 4p yesterday d/t no bed available.d/c SNF today awaiting rm,tel# for nurse to call report.PTAR to be called.      Final next level of care: Skilled Nursing Facility Barriers to Discharge: No Barriers Identified   Patient Goals and CMS Choice Patient states their goals for this hospitalization and ongoing recovery are:: go home      Discharge Placement   Existing PASRR number confirmed : 12/31/18          Patient chooses bed at: [facility] Patient to be transferred to facility by: [place] Name of family member notified: patient Patient and family notified of of transfer: 01/02/19  Discharge Plan and Services   Discharge Planning Services: CM Consult                                 Social Determinants of Health ([place]) Interventions     Readmission Risk Interventions Readmission Risk Prevention Plan 12/30/2018 11/02/2018  Transportation Screening Complete Complete  PCP or Specialist Appt within 3-5 Days - Not Complete  Not Complete comments - plan for SNF  HRI or [place] - Not Complete  HRI or Home Care Consult comments - plan for SNF  Social Work Consult for [place] Planning/Counseling - Complete  Palliative Care Screening - Not Applicable  Medication Review (RN Care Manager) Complete Complete  PCP or Specialist appointment within 3-5 days of discharge Not Complete -  PCP/Specialist Appt Not Complete comments Patient will make own pcp appt;not ready for d/c yet. -  HRI or Home Care Consult Complete -  SW Recovery Care/Counseling Consult Complete -  Palliative Care Screening Not Applicable -  [place] Not  Applicable -  Some recent data might be hidden

## 2019-01-24 ENCOUNTER — Emergency Department (HOSPITAL_COMMUNITY): Payer: Medicare Other

## 2019-01-24 ENCOUNTER — Inpatient Hospital Stay (HOSPITAL_COMMUNITY)
Admission: EM | Admit: 2019-01-24 | Discharge: 2019-01-31 | DRG: 698 | Disposition: A | Discharge status: Skilled Nursing Facility | Payer: Medicare Other | Attending: Internal Medicine | Admitting: Internal Medicine

## 2019-01-24 DIAGNOSIS — D649 Anemia, unspecified: Secondary | ICD-10-CM | POA: Diagnosis not present

## 2019-01-24 DIAGNOSIS — Z862 Personal history of diseases of the blood and blood-forming organs and certain disorders involving the immune mechanism: Secondary | ICD-10-CM

## 2019-01-24 DIAGNOSIS — Z20822 Contact with and (suspected) exposure to covid-19: Secondary | ICD-10-CM | POA: Diagnosis present

## 2019-01-24 DIAGNOSIS — Z886 Allergy status to analgesic agent status: Secondary | ICD-10-CM

## 2019-01-24 DIAGNOSIS — N179 Acute kidney failure, unspecified: Secondary | ICD-10-CM | POA: Diagnosis not present

## 2019-01-24 DIAGNOSIS — Z79899 Other long term (current) drug therapy: Secondary | ICD-10-CM

## 2019-01-24 DIAGNOSIS — E875 Hyperkalemia: Secondary | ICD-10-CM | POA: Diagnosis present

## 2019-01-24 DIAGNOSIS — N390 Urinary tract infection, site not specified: Secondary | ICD-10-CM | POA: Diagnosis present

## 2019-01-24 DIAGNOSIS — F0390 Unspecified dementia without behavioral disturbance: Secondary | ICD-10-CM | POA: Diagnosis present

## 2019-01-24 DIAGNOSIS — E871 Hypo-osmolality and hyponatremia: Secondary | ICD-10-CM | POA: Diagnosis not present

## 2019-01-24 DIAGNOSIS — E860 Dehydration: Secondary | ICD-10-CM | POA: Diagnosis present

## 2019-01-24 DIAGNOSIS — Y846 Urinary catheterization as the cause of abnormal reaction of the patient, or of later complication, without mention of misadventure at the time of the procedure: Secondary | ICD-10-CM | POA: Diagnosis present

## 2019-01-24 DIAGNOSIS — T83518A Infection and inflammatory reaction due to other urinary catheter, initial encounter: Principal | ICD-10-CM | POA: Diagnosis present

## 2019-01-24 DIAGNOSIS — D631 Anemia in chronic kidney disease: Secondary | ICD-10-CM | POA: Diagnosis present

## 2019-01-24 DIAGNOSIS — R7989 Other specified abnormal findings of blood chemistry: Secondary | ICD-10-CM | POA: Diagnosis present

## 2019-01-24 DIAGNOSIS — E039 Hypothyroidism, unspecified: Secondary | ICD-10-CM | POA: Diagnosis present

## 2019-01-24 DIAGNOSIS — N170 Acute kidney failure with tubular necrosis: Secondary | ICD-10-CM | POA: Diagnosis present

## 2019-01-24 DIAGNOSIS — E872 Acidosis: Secondary | ICD-10-CM | POA: Diagnosis present

## 2019-01-24 DIAGNOSIS — Z8249 Family history of ischemic heart disease and other diseases of the circulatory system: Secondary | ICD-10-CM

## 2019-01-24 DIAGNOSIS — D696 Thrombocytopenia, unspecified: Secondary | ICD-10-CM | POA: Diagnosis present

## 2019-01-24 DIAGNOSIS — E870 Hyperosmolality and hypernatremia: Secondary | ICD-10-CM | POA: Diagnosis present

## 2019-01-24 DIAGNOSIS — R7881 Bacteremia: Secondary | ICD-10-CM | POA: Diagnosis present

## 2019-01-24 DIAGNOSIS — G9340 Encephalopathy, unspecified: Secondary | ICD-10-CM

## 2019-01-24 DIAGNOSIS — F17210 Nicotine dependence, cigarettes, uncomplicated: Secondary | ICD-10-CM | POA: Diagnosis present

## 2019-01-24 DIAGNOSIS — I2510 Atherosclerotic heart disease of native coronary artery without angina pectoris: Secondary | ICD-10-CM | POA: Diagnosis present

## 2019-01-24 DIAGNOSIS — K7469 Other cirrhosis of liver: Secondary | ICD-10-CM | POA: Diagnosis present

## 2019-01-24 DIAGNOSIS — B1920 Unspecified viral hepatitis C without hepatic coma: Secondary | ICD-10-CM | POA: Diagnosis present

## 2019-01-24 DIAGNOSIS — Z79890 Hormone replacement therapy (postmenopausal): Secondary | ICD-10-CM

## 2019-01-24 DIAGNOSIS — Z66 Do not resuscitate: Secondary | ICD-10-CM | POA: Diagnosis present

## 2019-01-24 DIAGNOSIS — M25519 Pain in unspecified shoulder: Secondary | ICD-10-CM

## 2019-01-24 DIAGNOSIS — I129 Hypertensive chronic kidney disease with stage 1 through stage 4 chronic kidney disease, or unspecified chronic kidney disease: Secondary | ICD-10-CM | POA: Diagnosis present

## 2019-01-24 DIAGNOSIS — A4151 Sepsis due to Escherichia coli [E. coli]: Secondary | ICD-10-CM | POA: Diagnosis present

## 2019-01-24 DIAGNOSIS — A419 Sepsis, unspecified organism: Secondary | ICD-10-CM | POA: Diagnosis not present

## 2019-01-24 DIAGNOSIS — M109 Gout, unspecified: Secondary | ICD-10-CM | POA: Diagnosis present

## 2019-01-24 DIAGNOSIS — G9341 Metabolic encephalopathy: Secondary | ICD-10-CM | POA: Diagnosis present

## 2019-01-24 DIAGNOSIS — K7290 Hepatic failure, unspecified without coma: Secondary | ICD-10-CM | POA: Diagnosis present

## 2019-01-24 DIAGNOSIS — I10 Essential (primary) hypertension: Secondary | ICD-10-CM | POA: Diagnosis not present

## 2019-01-24 DIAGNOSIS — E785 Hyperlipidemia, unspecified: Secondary | ICD-10-CM | POA: Diagnosis present

## 2019-01-24 DIAGNOSIS — B1710 Acute hepatitis C without hepatic coma: Secondary | ICD-10-CM | POA: Diagnosis present

## 2019-01-24 DIAGNOSIS — B9620 Unspecified Escherichia coli [E. coli] as the cause of diseases classified elsewhere: Secondary | ICD-10-CM | POA: Diagnosis not present

## 2019-01-24 DIAGNOSIS — A530 Latent syphilis, unspecified as early or late: Secondary | ICD-10-CM | POA: Diagnosis present

## 2019-01-24 DIAGNOSIS — N3000 Acute cystitis without hematuria: Secondary | ICD-10-CM | POA: Diagnosis not present

## 2019-01-24 DIAGNOSIS — R6520 Severe sepsis without septic shock: Secondary | ICD-10-CM | POA: Diagnosis present

## 2019-01-24 DIAGNOSIS — N189 Chronic kidney disease, unspecified: Secondary | ICD-10-CM | POA: Diagnosis not present

## 2019-01-24 DIAGNOSIS — Z7952 Long term (current) use of systemic steroids: Secondary | ICD-10-CM

## 2019-01-24 DIAGNOSIS — F1020 Alcohol dependence, uncomplicated: Secondary | ICD-10-CM | POA: Diagnosis present

## 2019-01-24 DIAGNOSIS — N184 Chronic kidney disease, stage 4 (severe): Secondary | ICD-10-CM | POA: Diagnosis present

## 2019-01-24 DIAGNOSIS — R4182 Altered mental status, unspecified: Secondary | ICD-10-CM | POA: Diagnosis not present

## 2019-01-24 LAB — CBG MONITORING, ED
Glucose-Capillary: 68 mg/dL — ABNORMAL LOW (ref 70–99)
Glucose-Capillary: 107 mg/dL — ABNORMAL HIGH (ref 70–99)

## 2019-01-24 LAB — I-STAT CHEM 8, ED
BUN: 68 mg/dL — ABNORMAL HIGH (ref 8–23)
Calcium, Ion: 1.06 mmol/L — ABNORMAL LOW (ref 1.15–1.40)
Chloride: 123 mmol/L — ABNORMAL HIGH (ref 98–111)
Creatinine, Ser: 3.9 mg/dL — ABNORMAL HIGH (ref 0.61–1.24)
Glucose, Bld: 84 mg/dL (ref 70–99)
HCT: 30 % — ABNORMAL LOW (ref 39.0–52.0)
Hemoglobin: 10.2 g/dL — ABNORMAL LOW (ref 13.0–17.0)
Potassium: 6.1 mmol/L — ABNORMAL HIGH (ref 3.5–5.1)
Sodium: 153 mmol/L — ABNORMAL HIGH (ref 135–145)
TCO2: 17 mmol/L — ABNORMAL LOW (ref 22–32)

## 2019-01-24 LAB — COMPREHENSIVE METABOLIC PANEL
ALT: 35 U/L (ref 0–44)
AST: 53 U/L — ABNORMAL HIGH (ref 15–41)
Albumin: 1.9 g/dL — ABNORMAL LOW (ref 3.5–5.0)
Alkaline Phosphatase: 166 U/L — ABNORMAL HIGH (ref 38–126)
Anion gap: 11 (ref 5–15)
BUN: 80 mg/dL — ABNORMAL HIGH (ref 8–23)
CO2: 16 mmol/L — ABNORMAL LOW (ref 22–32)
Calcium: 8.2 mg/dL — ABNORMAL LOW (ref 8.9–10.3)
Chloride: 121 mmol/L — ABNORMAL HIGH (ref 98–111)
Creatinine, Ser: 3.63 mg/dL — ABNORMAL HIGH (ref 0.61–1.24)
GFR calc Af Amer: 18 mL/min — ABNORMAL LOW (ref >60)
GFR calc non Af Amer: 16 mL/min — ABNORMAL LOW (ref >60)
Glucose, Bld: 89 mg/dL (ref 70–99)
Potassium: 6.1 mmol/L — ABNORMAL HIGH (ref 3.5–5.1)
Sodium: 148 mmol/L — ABNORMAL HIGH (ref 135–145)
Total Bilirubin: 3.5 mg/dL — ABNORMAL HIGH (ref 0.3–1.2)
Total Protein: 7.8 g/dL (ref 6.5–8.1)

## 2019-01-24 LAB — CBC WITH DIFFERENTIAL/PLATELET
Abs Immature Granulocytes: 0.28 10*3/uL — ABNORMAL HIGH (ref 0.00–0.07)
Basophils Absolute: 0 10*3/uL (ref 0.0–0.1)
Basophils Relative: 0 %
Eosinophils Absolute: 0 10*3/uL (ref 0.0–0.5)
Eosinophils Relative: 0 %
HCT: 33.7 % — ABNORMAL LOW (ref 39.0–52.0)
Hemoglobin: 11.1 g/dL — ABNORMAL LOW (ref 13.0–17.0)
Immature Granulocytes: 2 %
Lymphocytes Relative: 12 %
Lymphs Abs: 1.3 10*3/uL (ref 0.7–4.0)
MCH: 27.8 pg (ref 26.0–34.0)
MCHC: 32.9 g/dL (ref 30.0–36.0)
MCV: 84.3 fL (ref 80.0–100.0)
Monocytes Absolute: 0.9 10*3/uL (ref 0.1–1.0)
Monocytes Relative: 8 %
Neutro Abs: 8.9 10*3/uL — ABNORMAL HIGH (ref 1.7–7.7)
Neutrophils Relative %: 78 %
Platelets: 139 10*3/uL — ABNORMAL LOW (ref 150–400)
RBC: 4 MIL/uL — ABNORMAL LOW (ref 4.22–5.81)
RDW: 16.5 % — ABNORMAL HIGH (ref 11.5–15.5)
WBC: 11.4 10*3/uL — ABNORMAL HIGH (ref 4.0–10.5)
nRBC: 0.3 % — ABNORMAL HIGH (ref 0.0–0.2)

## 2019-01-24 LAB — URINALYSIS, ROUTINE W REFLEX MICROSCOPIC
Bilirubin Urine: NEGATIVE
Glucose, UA: NEGATIVE mg/dL
Ketones, ur: NEGATIVE mg/dL
Nitrite: NEGATIVE
Protein, ur: 30 mg/dL — AB
RBC / HPF: >50 RBC/hpf — ABNORMAL HIGH (ref 0–5)
Specific Gravity, Urine: 1.014 (ref 1.005–1.030)
pH: 5 (ref 5.0–8.0)

## 2019-01-24 LAB — LACTIC ACID, PLASMA: Lactic Acid, Venous: 2.9 mmol/L (ref 0.5–1.9)

## 2019-01-24 LAB — TROPONIN I (HIGH SENSITIVITY)
Troponin I (High Sensitivity): 20 ng/L — ABNORMAL HIGH (ref <18)
Troponin I (High Sensitivity): 18 ng/L — ABNORMAL HIGH (ref <18)

## 2019-01-24 LAB — AMMONIA: Ammonia: 63 umol/L — ABNORMAL HIGH (ref 9–35)

## 2019-01-24 LAB — POC SARS CORONAVIRUS 2 AG -  ED: SARS Coronavirus 2 Ag: NEGATIVE

## 2019-01-24 MED ORDER — SODIUM CHLORIDE 0.9 % IV BOLUS
1000.0000 mL | Freq: Once | INTRAVENOUS | Status: AC
  Administered 2019-01-24: 1000 mL via INTRAVENOUS

## 2019-01-24 MED ORDER — SODIUM CHLORIDE 0.9 % IV SOLN
2.0000 g | INTRAVENOUS | Status: DC
  Filled 2019-01-24: qty 2

## 2019-01-24 MED ORDER — SODIUM CHLORIDE 0.9 % IV SOLN
1.0000 g | Freq: Once | INTRAVENOUS | Status: AC
  Administered 2019-01-24: 17:00:00 1 g via INTRAVENOUS
  Filled 2019-01-24: qty 10

## 2019-01-24 MED ORDER — SODIUM BICARBONATE 8.4 % IV SOLN
50.0000 meq | Freq: Once | INTRAVENOUS | Status: AC
  Administered 2019-01-24: 50 meq via INTRAVENOUS
  Filled 2019-01-24: qty 50

## 2019-01-24 MED ORDER — SODIUM CHLORIDE 0.9 % IV SOLN
1.0000 g | Freq: Once | INTRAVENOUS | Status: AC
  Administered 2019-01-24: 1 g via INTRAVENOUS
  Filled 2019-01-24: qty 1

## 2019-01-24 MED ORDER — ACETAMINOPHEN 650 MG RE SUPP
650.0000 mg | Freq: Once | RECTAL | Status: AC
  Administered 2019-01-24: 650 mg via RECTAL
  Filled 2019-01-24: qty 1

## 2019-01-24 MED ORDER — VANCOMYCIN VARIABLE DOSE PER UNSTABLE RENAL FUNCTION (PHARMACIST DOSING): Status: DC

## 2019-01-24 MED ORDER — VANCOMYCIN HCL IN DEXTROSE 1-5 GM/200ML-% IV SOLN
1000.0000 mg | Freq: Once | INTRAVENOUS | Status: AC
  Administered 2019-01-24: 1000 mg via INTRAVENOUS
  Filled 2019-01-24: qty 200

## 2019-01-24 MED ORDER — SODIUM BICARBONATE 8.4 % IV SOLN
INTRAVENOUS | Status: DC
  Filled 2019-01-24 (×8): qty 100

## 2019-01-24 MED ORDER — DEXTROSE 50 % IV SOLN
50.0000 mL | Freq: Once | INTRAVENOUS | Status: AC
  Administered 2019-01-24: 50 mL via INTRAVENOUS
  Filled 2019-01-24: qty 50

## 2019-01-24 NOTE — ED Triage Notes (Signed)
Patient arrived via EMS from [facility] which he was there temporarily for rehab. Staff report declining mental status over x3 days which became significantly worse today. Patient can usually answer questions but is nonverbal now.   Hx: drug/etoh use, hepatic encephalopathy   101-102 temp Warm to touch No tylenol - liver problems  138/52 90 HR 100% RA 20 R

## 2019-01-24 NOTE — ED Notes (Signed)
Date and time results received: 01/24/19 7:03 PM  (use smartphrase ".now" to insert current time)  Test: Lactic Critical Value: 2.9  Name of Provider Notified: Mindi Junker, DO  Orders Received? Or Actions Taken?: See chart
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
## 2019-01-24 NOTE — Progress Notes (Signed)
Pharmacy Antibiotic Note  Bruce Hayes is a 73 y.o. male admitted on 01/24/2019 with urosepsis.  Pharmacy has been consulted for vancomycin and cefepime dosing.  Plan:  Vanc 1g IV x 1 given in ED; will enter future doses based on levels and SCr - hae ordered random vancomycin level for 12/27 AM  Cefepime 2 g IV q24 hr  SCr daily     Temp (24hrs), Avg:101.7 F (38.7 C), Min:100.3 F (37.9 C), Max:103.1 F (39.5 C)  Recent Labs  Lab 01/24/19 1440 01/24/19 1447 01/24/19 1605  WBC 11.4*  --   --   CREATININE 3.63* 3.90*  --   LATICACIDVEN 3.7*  --  2.9*    CrCl cannot be calculated (Unknown ideal weight.).    Allergies  Allergen Reactions  . Tylenol [Acetaminophen] Other (See Comments)    Liver condition     Thank you for allowing pharmacy to be a part of this patient's care.  Reuel Boom, PharmD, BCPS [phone] 01/24/2019, 10:07 PM

## 2019-01-24 NOTE — ED Notes (Signed)
Dr. Darl Householder aware patients lactic acid is 3.7
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
## 2019-01-25 DIAGNOSIS — R7881 Bacteremia: Secondary | ICD-10-CM

## 2019-01-25 DIAGNOSIS — N184 Chronic kidney disease, stage 4 (severe): Secondary | ICD-10-CM

## 2019-01-25 DIAGNOSIS — N179 Acute kidney failure, unspecified: Secondary | ICD-10-CM

## 2019-01-25 DIAGNOSIS — A419 Sepsis, unspecified organism: Secondary | ICD-10-CM

## 2019-01-25 DIAGNOSIS — N390 Urinary tract infection, site not specified: Secondary | ICD-10-CM

## 2019-01-25 LAB — RENAL FUNCTION PANEL
Albumin: 1.6 g/dL — ABNORMAL LOW (ref 3.5–5.0)
Anion gap: 9 (ref 5–15)
BUN: 80 mg/dL — ABNORMAL HIGH (ref 8–23)
CO2: 20 mmol/L — ABNORMAL LOW (ref 22–32)
Calcium: 7.6 mg/dL — ABNORMAL LOW (ref 8.9–10.3)
Chloride: 119 mmol/L — ABNORMAL HIGH (ref 98–111)
Creatinine, Ser: 3.32 mg/dL — ABNORMAL HIGH (ref 0.61–1.24)
GFR calc Af Amer: 20 mL/min — ABNORMAL LOW (ref >60)
GFR calc non Af Amer: 18 mL/min — ABNORMAL LOW (ref >60)
Glucose, Bld: 150 mg/dL — ABNORMAL HIGH (ref 70–99)
Phosphorus: 4.1 mg/dL (ref 2.5–4.6)
Potassium: 5.6 mmol/L — ABNORMAL HIGH (ref 3.5–5.1)
Sodium: 148 mmol/L — ABNORMAL HIGH (ref 135–145)

## 2019-01-25 LAB — CBC
HCT: 27.9 % — ABNORMAL LOW (ref 39.0–52.0)
HCT: 26.4 % — ABNORMAL LOW (ref 39.0–52.0)
Hemoglobin: 9.6 g/dL — ABNORMAL LOW (ref 13.0–17.0)
Hemoglobin: 9 g/dL — ABNORMAL LOW (ref 13.0–17.0)
MCH: 28.7 pg (ref 26.0–34.0)
MCH: 28.4 pg (ref 26.0–34.0)
MCHC: 34.4 g/dL (ref 30.0–36.0)
MCHC: 34.1 g/dL (ref 30.0–36.0)
MCV: 83.3 fL (ref 80.0–100.0)
MCV: 83.3 fL (ref 80.0–100.0)
Platelets: 131 10*3/uL — ABNORMAL LOW (ref 150–400)
Platelets: 145 10*3/uL — ABNORMAL LOW (ref 150–400)
RBC: 3.35 MIL/uL — ABNORMAL LOW (ref 4.22–5.81)
RBC: 3.17 MIL/uL — ABNORMAL LOW (ref 4.22–5.81)
RDW: 16.5 % — ABNORMAL HIGH (ref 11.5–15.5)
RDW: 16.4 % — ABNORMAL HIGH (ref 11.5–15.5)
WBC: 12.5 10*3/uL — ABNORMAL HIGH (ref 4.0–10.5)
WBC: 13.4 10*3/uL — ABNORMAL HIGH (ref 4.0–10.5)
nRBC: 0.3 % — ABNORMAL HIGH (ref 0.0–0.2)
nRBC: 0.2 % (ref 0.0–0.2)

## 2019-01-25 LAB — BLOOD CULTURE ID PANEL (REFLEXED)

## 2019-01-25 LAB — CK: Total CK: 102 U/L (ref 49–397)

## 2019-01-25 LAB — PHOSPHORUS: Phosphorus: 4.6 mg/dL (ref 2.5–4.6)

## 2019-01-25 LAB — NA AND K (SODIUM & POTASSIUM), RAND UR
Potassium Urine: 65 mmol/L
Sodium, Ur: 16 mmol/L

## 2019-01-25 LAB — BASIC METABOLIC PANEL
Anion gap: 9 (ref 5–15)
Anion gap: 10 (ref 5–15)
BUN: 80 mg/dL — ABNORMAL HIGH (ref 8–23)
BUN: 80 mg/dL — ABNORMAL HIGH (ref 8–23)
CO2: 19 mmol/L — ABNORMAL LOW (ref 22–32)
CO2: 22 mmol/L (ref 22–32)
Calcium: 7.6 mg/dL — ABNORMAL LOW (ref 8.9–10.3)
Calcium: 7.4 mg/dL — ABNORMAL LOW (ref 8.9–10.3)
Chloride: 120 mmol/L — ABNORMAL HIGH (ref 98–111)
Chloride: 116 mmol/L — ABNORMAL HIGH (ref 98–111)
Creatinine, Ser: 3.33 mg/dL — ABNORMAL HIGH (ref 0.61–1.24)
Creatinine, Ser: 3.45 mg/dL — ABNORMAL HIGH (ref 0.61–1.24)
GFR calc Af Amer: 20 mL/min — ABNORMAL LOW (ref >60)
GFR calc non Af Amer: 18 mL/min — ABNORMAL LOW (ref >60)
Glucose, Bld: 144 mg/dL — ABNORMAL HIGH (ref 70–99)
Glucose, Bld: 150 mg/dL — ABNORMAL HIGH (ref 70–99)
Potassium: 5.6 mmol/L — ABNORMAL HIGH (ref 3.5–5.1)
Potassium: 4.7 mmol/L (ref 3.5–5.1)
Sodium: 148 mmol/L — ABNORMAL HIGH (ref 135–145)
Sodium: 148 mmol/L — ABNORMAL HIGH (ref 135–145)

## 2019-01-25 LAB — SARS CORONAVIRUS 2 (TAT 6-24 HRS): SARS Coronavirus 2: NEGATIVE

## 2019-01-25 LAB — OSMOLALITY, URINE: Osmolality, Ur: 452 mOsm/kg (ref 300–900)

## 2019-01-25 LAB — MAGNESIUM: Magnesium: 2.7 mg/dL — ABNORMAL HIGH (ref 1.7–2.4)

## 2019-01-25 LAB — URIC ACID: Uric Acid, Serum: 12 mg/dL — ABNORMAL HIGH (ref 3.7–8.6)

## 2019-01-25 LAB — CREATININE, URINE, RANDOM: Creatinine, Urine: 111.56 mg/dL

## 2019-01-25 LAB — LACTIC ACID, PLASMA
Lactic Acid, Venous: 3.7 mmol/L (ref 0.5–1.9)
Lactic Acid, Venous: 2.5 mmol/L (ref 0.5–1.9)
Lactic Acid, Venous: 3.5 mmol/L (ref 0.5–1.9)

## 2019-01-25 LAB — POTASSIUM: Potassium: 5.1 mmol/L (ref 3.5–5.1)

## 2019-01-25 LAB — RPR
RPR Ser Ql: REACTIVE — AB
RPR Titer: 1:8 {titer}

## 2019-01-25 LAB — TSH: TSH: 2.623 u[IU]/mL (ref 0.350–4.500)

## 2019-01-25 LAB — BRAIN NATRIURETIC PEPTIDE: B Natriuretic Peptide: 148.2 pg/mL — ABNORMAL HIGH (ref 0.0–100.0)

## 2019-01-25 MED ORDER — SODIUM CHLORIDE 0.9 % IV SOLN
2.0000 g | INTRAVENOUS | Status: DC
  Administered 2019-01-25 – 2019-01-27 (×3): 2 g via INTRAVENOUS
  Filled 2019-01-25 (×4): qty 20

## 2019-01-25 MED ORDER — CHLORHEXIDINE GLUCONATE CLOTH 2 % EX PADS
6.0000 | MEDICATED_PAD | Freq: Every day | CUTANEOUS | Status: DC
  Administered 2019-01-25 – 2019-01-31 (×7): 6 via TOPICAL

## 2019-01-25 MED ORDER — SERTRALINE HCL 25 MG PO TABS
25.0000 mg | ORAL_TABLET | Freq: Every day | ORAL | Status: DC
  Administered 2019-01-27 – 2019-01-31 (×5): 25 mg via ORAL
  Filled 2019-01-25 (×6): qty 1

## 2019-01-25 MED ORDER — SODIUM BICARBONATE 8.4 % IV SOLN
Freq: Once | INTRAVENOUS | Status: DC
  Filled 2019-01-25: qty 100

## 2019-01-25 MED ORDER — BISACODYL 10 MG RE SUPP: 10.0000 mg | Freq: Every day | RECTAL | Status: DC | PRN

## 2019-01-25 MED ORDER — LEVOTHYROXINE SODIUM 50 MCG PO TABS
50.0000 ug | ORAL_TABLET | Freq: Every day | ORAL | Status: DC
  Filled 2019-01-25: qty 1

## 2019-01-25 MED ORDER — THIAMINE HCL 100 MG PO TABS
100.0000 mg | ORAL_TABLET | Freq: Every day | ORAL | Status: DC
  Filled 2019-01-25: qty 1

## 2019-01-25 MED ORDER — ORAL CARE MOUTH RINSE
15.0000 mL | Freq: Two times a day (BID) | OROMUCOSAL | Status: DC
  Administered 2019-01-26 – 2019-01-31 (×11): 15 mL via OROMUCOSAL

## 2019-01-25 MED ORDER — PANTOPRAZOLE SODIUM 40 MG PO TBEC
40.0000 mg | DELAYED_RELEASE_TABLET | Freq: Every day | ORAL | Status: DC
  Filled 2019-01-25: qty 1

## 2019-01-25 MED ORDER — POLYETHYLENE GLYCOL 3350 17 G PO PACK: 17.0000 g | PACK | Freq: Every day | ORAL | Status: DC | PRN

## 2019-01-25 MED ORDER — ACETAMINOPHEN 325 MG PO TABS
650.0000 mg | ORAL_TABLET | Freq: Four times a day (QID) | ORAL | Status: DC | PRN
  Administered 2019-01-29 (×2): 650 mg via ORAL
  Filled 2019-01-25 (×2): qty 2

## 2019-01-25 MED ORDER — MORPHINE SULFATE (PF) 2 MG/ML IV SOLN
1.0000 mg | Freq: Once | INTRAVENOUS | Status: AC
  Administered 2019-01-25: 1 mg via INTRAVENOUS
  Filled 2019-01-25: qty 1

## 2019-01-25 MED ORDER — ACETAMINOPHEN 650 MG RE SUPP
650.0000 mg | Freq: Four times a day (QID) | RECTAL | Status: DC | PRN
  Administered 2019-01-25: 650 mg via RECTAL
  Filled 2019-01-25: qty 1

## 2019-01-25 MED ORDER — HEPARIN SODIUM (PORCINE) 5000 UNIT/ML IJ SOLN
5000.0000 [IU] | Freq: Three times a day (TID) | INTRAMUSCULAR | Status: DC
  Administered 2019-01-25 – 2019-01-30 (×17): 5000 [IU] via SUBCUTANEOUS
  Filled 2019-01-25 (×15): qty 1

## 2019-01-25 MED ORDER — PANTOPRAZOLE SODIUM 40 MG IV SOLR
40.0000 mg | INTRAVENOUS | Status: DC
  Administered 2019-01-25 – 2019-01-30 (×6): 40 mg via INTRAVENOUS
  Filled 2019-01-25 (×5): qty 40

## 2019-01-25 MED ORDER — LACTULOSE ENEMA
300.0000 mL | Freq: Once | ORAL | Status: AC
  Administered 2019-01-25: 300 mL via RECTAL
  Filled 2019-01-25: qty 300

## 2019-01-25 MED ORDER — ONDANSETRON HCL 4 MG PO TABS: 4.0000 mg | ORAL_TABLET | Freq: Four times a day (QID) | ORAL | Status: DC | PRN

## 2019-01-25 MED ORDER — SODIUM CHLORIDE 0.9% FLUSH
3.0000 mL | Freq: Two times a day (BID) | INTRAVENOUS | Status: DC
  Administered 2019-01-25 – 2019-01-26 (×3): 3 mL via INTRAVENOUS
  Administered 2019-01-26: 9 mL via INTRAVENOUS
  Administered 2019-01-27 – 2019-01-31 (×7): 3 mL via INTRAVENOUS

## 2019-01-25 MED ORDER — ONDANSETRON HCL 4 MG/2ML IJ SOLN: 4.0000 mg | Freq: Four times a day (QID) | INTRAMUSCULAR | Status: DC | PRN

## 2019-01-25 MED ORDER — LEVOTHYROXINE SODIUM 100 MCG/5ML IV SOLN
25.0000 ug | Freq: Every day | INTRAVENOUS | Status: DC
  Administered 2019-01-25 – 2019-01-27 (×3): 25 ug via INTRAVENOUS
  Filled 2019-01-25 (×3): qty 5

## 2019-01-25 NOTE — ED Notes (Signed)
Patient changed, moderate amount of soft bowl movement in brief from enema. Rectal temperature checked, 101.0, rectal tylenol to be given.

## 2019-01-25 NOTE — ED Notes (Signed)
Report given to 2W RN.

## 2019-01-25 NOTE — Progress Notes (Signed)
PHARMACY - PHYSICIAN COMMUNICATION CRITICAL VALUE ALERT - BLOOD CULTURE IDENTIFICATION (BCID)  Bruce Hayes is an 73 y.o. male who presented to [facility] on 01/24/2019 with a chief complaint of AMS  Assessment: Started on broad spectrum antibiotics on admission for sepsis, suspected urinary source. BCID + 4/4 GNR, E.coli.   Name of physician (or Provider) Contacted: Dr. Horris Latino  Current antibiotics: Cefepime + vancomycin  Changes to prescribed antibiotics recommended:  De-escalated to ceftriaxone 2 g IV q24h per protocol  Results for orders placed or performed during the hospital encounter of 01/24/19  Blood Culture ID Panel (Reflexed) (Collected: 01/24/2019  2:40 PM)  Result Value Ref Range   Enterococcus species NOT DETECTED NOT DETECTED   Listeria monocytogenes NOT DETECTED NOT DETECTED   Staphylococcus species NOT DETECTED NOT DETECTED   Staphylococcus aureus (BCID) NOT DETECTED NOT DETECTED   Streptococcus species NOT DETECTED NOT DETECTED   Streptococcus agalactiae NOT DETECTED NOT DETECTED   Streptococcus pneumoniae NOT DETECTED NOT DETECTED   Streptococcus pyogenes NOT DETECTED NOT DETECTED   Acinetobacter baumannii NOT DETECTED NOT DETECTED   Enterobacteriaceae species DETECTED (A) NOT DETECTED   Enterobacter cloacae complex NOT DETECTED NOT DETECTED   Escherichia coli DETECTED (A) NOT DETECTED   Klebsiella oxytoca NOT DETECTED NOT DETECTED   Klebsiella pneumoniae NOT DETECTED NOT DETECTED   Proteus species NOT DETECTED NOT DETECTED   Serratia marcescens NOT DETECTED NOT DETECTED   Carbapenem resistance NOT DETECTED NOT DETECTED   Haemophilus influenzae NOT DETECTED NOT DETECTED   Neisseria meningitidis NOT DETECTED NOT DETECTED   Pseudomonas aeruginosa NOT DETECTED NOT DETECTED   Candida albicans NOT DETECTED NOT DETECTED   Candida glabrata NOT DETECTED NOT DETECTED   Candida krusei NOT DETECTED NOT DETECTED   Candida parapsilosis NOT DETECTED NOT DETECTED   Candida tropicalis NOT DETECTED NOT DETECTED    Lenis Noon, PharmD 01/25/2019  9:01 AM

## 2019-01-25 NOTE — Progress Notes (Signed)
PROGRESS NOTE  Bruce CAHALAN DPO:[id number] DOB: Oct 09, 1945 DOA: 01/24/2019 PCP: Lorene Dy, MD  HPI/Recap of past 24 hours: HPI from Dr Mindi Junker Bruce Hayes is a 73 y.o. male with medical history significant for alcohol/HCV cirrhosis, CAD, hypertension, history of substance abuse, hyperlipidemia, coming from SNF presents with fever, 3 days of progressive altered mental status to the point where he is currently nonverbal, despite being usually conversational. Pt was recently admitted from 11/29 to 12/3 for an AKI with associated hyperkalemia. History is corroborated by the nursing home and EMS personnel, in addition to EDP as pt is altered. In the ED, pt was febrile to 103, intermittent soft blood pressures 90/60, saturating 100% on room air. Initial labwork notable for potassium 6.1, chloride 123, bicarb 16 lactic acid 3.7, ammonia 63. Urinalysis with many bacteria, moderate leukocytes.  Imaging studies notable for no acute cardiopulmonary disease on chest x-ray.  Renal ultrasound with no evidence of hydronephrosis.  Patient admitted for further management     Today, patient seen and examined at bedside, unable to give any substantial history.  Looks comfortable, did not appear to be in any distress.    Assessment/Plan: Active Problems:   Encephalopathy  Sepsis likely 2/2 E. coli bacteremia from possible UTI Febrile with leukocytosis Lactic acid elevated, will trend UC grew gram-negative rods BC x2 growing E. Coli Renal ultrasound no evidence of hydronephrosis Chest x-ray unremarkable Continue IV ceftriaxone 2 g IV fluids Monitor closely  Acute metabolic encephalopathy Multifactorial, likely due to sepsis, azotemia, elevated ammonia Management as above  AKI on CKD stage IV/hyperkalemia/elevated uric acid/metabolic acidosis Likely 2/2 sepsis, with poor oral intake in the setting of encephalopathy Baseline creatinine around 2.5 Potassium trending down Nephrology on  board, continue bicarb drip  Anemia of chronic kidney disease Baseline hemoglobin around 10-11 No evidence of bleeding Daily CBC  Hypernatremia  Likely 2/2 poor oral intake Continue IV fluids as above  Alcoholic/HCV cirrhosis Ammonia elevated, will trend S/p 1 dose of lactulose enema on 01/25/19 Hold home p.o. lactulose for now, will use lactulose enema if needed  RPR reactive 1:8 titer, ??  False positive, could be mildly elevated in cirrhosis or HIV infection HIV test pending Would verify with ID  Hypertension BP currently soft Hold home Coreg, clonidine for now         Malnutrition Type:      Malnutrition Characteristics:      Nutrition Interventions:       Estimated body mass index is 26.87 kg/m as calculated from the following:   Height as of 12/29/18: 5\' 11"  (1.803 m).   Weight as of 01/02/19: 87.4 kg.     Code Status: DNR  Family Communication: None at bedside  Disposition Plan: Likely back to SNF   Consultants:  Nephrology  Procedures:  None  Antimicrobials:  Ceftriaxone  DVT prophylaxis: Heparin   Objective: Vitals:   01/25/19 1300 01/25/19 1330 01/25/19 1400 01/25/19 1500  BP: (!) 107/48 (!) 122/51 (!) 108/48 105/72  Pulse: 70 71 63 72  Resp: 12 15 12 16   Temp:    97.8 F (36.6 C)  TempSrc:    Rectal  SpO2: 98% 100% 99% 100%    Intake/Output Summary (Last 24 hours) at 01/25/2019 1521 Last data filed at 01/25/2019 1010 Gross per 24 hour  Intake 1495.8 ml  Output --  Net 1495.8 ml   There were no vitals filed for this visit.  Exam:  General: NAD, chronically ill-appearing, awake, alert, not oriented,  appears lethargic  Cardiovascular: S1, S2 present  Respiratory: CTAB  Abdomen: Soft, nontender, nondistended, bowel sounds present  Musculoskeletal: No bilateral pedal edema noted  Skin: Normal  Psychiatry:  Unable to assess   Data Reviewed: CBC: Recent Labs  Lab 01/24/19 1440 01/24/19 1447  01/25/19 0214 01/25/19 0517  WBC 11.4*  --  12.5* 13.4*  NEUTROABS 8.9*  --   --   --   HGB 11.1* 10.2* 9.6* 9.0*  HCT 33.7* 30.0* 27.9* 26.4*  MCV 84.3  --  83.3 83.3  PLT 139*  --  131* 540*   Basic Metabolic Panel: Recent Labs  Lab 01/24/19 1440 01/24/19 1447 01/25/19 0214 01/25/19 0517 01/25/19 0823  NA 148* 153* 148* 148*  --   K 6.1* 6.1* 5.6* 5.6* 5.1  CL 121* 123* 120* 119*  --   CO2 16*  --  19* 20*  --   GLUCOSE 89 84 144* 150*  --   BUN 80* 68* 80* 80*  --   CREATININE 3.63* 3.90* 3.33* 3.32*  --   CALCIUM 8.2*  --  7.6* 7.6*  --   MG  --   --  2.7*  --   --   PHOS  --   --  4.6 4.1  --    GFR: CrCl cannot be calculated (Unknown ideal weight.). Liver Function Tests: Recent Labs  Lab 01/24/19 1440 01/25/19 0517  AST 53*  --   ALT 35  --   ALKPHOS 166*  --   BILITOT 3.5*  --   PROT 7.8  --   ALBUMIN 1.9* 1.6*   No results for input(s): LIPASE, AMYLASE in the last 168 hours. Recent Labs  Lab 01/24/19 1440  AMMONIA 63*   Coagulation Profile: No results for input(s): INR, PROTIME in the last 168 hours. Cardiac Enzymes: Recent Labs  Lab 01/25/19 0517  CKTOTAL 102   BNP (last 3 results) No results for input(s): PROBNP in the last 8760 hours. HbA1C: No results for input(s): HGBA1C in the last 72 hours. CBG: Recent Labs  Lab 01/24/19 1350 01/24/19 1740  GLUCAP 68* 107*   Lipid Profile: No results for input(s): CHOL, HDL, LDLCALC, TRIG, CHOLHDL, LDLDIRECT in the last 72 hours. Thyroid Function Tests: Recent Labs    01/25/19 0214  TSH 2.623   Anemia Panel: No results for input(s): VITAMINB12, FOLATE, FERRITIN, TIBC, IRON, RETICCTPCT in the last 72 hours. Urine analysis:    Component Value Date/Time   COLORURINE AMBER (A) 01/24/2019 1649   APPEARANCEUR HAZY (A) 01/24/2019 1649   LABSPEC 1.014 01/24/2019 1649   PHURINE 5.0 01/24/2019 1649   GLUCOSEU NEGATIVE 01/24/2019 1649   HGBUR LARGE (A) 01/24/2019 1649   BILIRUBINUR NEGATIVE  01/24/2019 1649   KETONESUR NEGATIVE 01/24/2019 1649   PROTEINUR 30 (A) 01/24/2019 1649   UROBILINOGEN 4.0 (H) 03/31/2014 0920   NITRITE NEGATIVE 01/24/2019 1649   LEUKOCYTESUR MODERATE (A) 01/24/2019 1649   Sepsis Labs: @LABRCNTIP (procalcitonin:4,lacticidven:4)  ) Recent Results (from the past 240 hour(s))  Blood culture (routine x 2)     Status: None (Preliminary result)   Collection Time: 01/24/19  2:40 PM   Specimen: BLOOD  Result Value Ref Range Status   Specimen Description   Final    BLOOD RIGHT ANTECUBITAL Performed at [facility] [address] [place], [place] [address]    Special Requests   Final    BOTTLES DRAWN AEROBIC AND ANAEROBIC Blood Culture results may not be optimal due to an  inadequate volume of blood received in culture bottles Performed at [facility] [address] [place], [place] [address]    Culture  Setup Time   Final    GRAM NEGATIVE RODS IN BOTH AEROBIC AND ANAEROBIC BOTTLES Organism ID to follow CRITICAL RESULT CALLED TO, READ BACK BY AND VERIFIED WITH: PHARMD ANH PHAM [id number] FCP Performed at [facility], [place] [address], [place], [place] [address]    Culture GRAM NEGATIVE RODS  Final   Report Status PENDING  Incomplete  Blood culture (routine x 2)     Status: None (Preliminary result)   Collection Time: 01/24/19  2:40 PM   Specimen: BLOOD RIGHT ARM  Result Value Ref Range Status   Specimen Description   Final    BLOOD RIGHT ARM Performed at [facility], [place] [address] [place], [place] [address]    Special Requests   Final    BOTTLES DRAWN AEROBIC AND ANAEROBIC Blood Culture results may not be optimal due to an inadequate volume of blood received in culture bottles Performed at [facility] [address] [place], [place] [address]    Culture  Setup Time   Final    GRAM NEGATIVE RODS IN BOTH AEROBIC AND ANAEROBIC BOTTLES CRITICAL VALUE NOTED.  VALUE IS  CONSISTENT WITH PREVIOUSLY REPORTED AND CALLED VALUE. Performed at [facility], [place] [address], [place], [place] [address]    Culture GRAM NEGATIVE RODS  Final   Report Status PENDING  Incomplete  Blood Culture ID Panel (Reflexed)     Status: Abnormal   Collection Time: 01/24/19  2:40 PM  Result Value Ref Range Status   Enterococcus species NOT DETECTED NOT DETECTED Final   Listeria monocytogenes NOT DETECTED NOT DETECTED Final   Staphylococcus species NOT DETECTED NOT DETECTED Final   Staphylococcus aureus (BCID) NOT DETECTED NOT DETECTED Final   Streptococcus species NOT DETECTED NOT DETECTED Final   Streptococcus agalactiae NOT DETECTED NOT DETECTED Final   Streptococcus pneumoniae NOT DETECTED NOT DETECTED Final   Streptococcus pyogenes NOT DETECTED NOT DETECTED Final   Acinetobacter baumannii NOT DETECTED NOT DETECTED Final   Enterobacteriaceae species DETECTED (A) NOT DETECTED Final    Comment: Enterobacteriaceae represent a large family of gram-negative bacteria, not a single organism. CRITICAL RESULT CALLED TO, READ BACK BY AND VERIFIED WITH: PHARMD ANH PHAM [phone] [id number] FCP    Enterobacter cloacae complex NOT DETECTED NOT DETECTED Final   Escherichia coli DETECTED (A) NOT DETECTED Final    Comment: CRITICAL RESULT CALLED TO, READ BACK BY AND VERIFIED WITH: PHARMD ANH PHAM [id number] [id number] FCP    Klebsiella oxytoca NOT DETECTED NOT DETECTED Final   Klebsiella pneumoniae NOT DETECTED NOT DETECTED Final   Proteus species NOT DETECTED NOT DETECTED Final   Serratia marcescens NOT DETECTED NOT DETECTED Final   Carbapenem resistance NOT DETECTED NOT DETECTED Final   Haemophilus influenzae NOT DETECTED NOT DETECTED Final   Neisseria meningitidis NOT DETECTED NOT DETECTED Final   Pseudomonas aeruginosa NOT DETECTED NOT DETECTED Final   Candida albicans NOT DETECTED NOT DETECTED Final   Candida glabrata NOT DETECTED NOT DETECTED Final   Candida krusei NOT DETECTED NOT DETECTED  Final   Candida parapsilosis NOT DETECTED NOT DETECTED Final   Candida tropicalis NOT DETECTED NOT DETECTED Final    Comment: Performed at [facility], [place] [address], [place], [place] [address]  Urine culture     Status: Abnormal (Preliminary result)  Collection Time: 01/24/19  4:48 PM   Specimen: Urine, Clean Catch  Result Value Ref Range Status   Specimen Description   Final    URINE, CLEAN CATCH Performed at [facility], [place] [address], [place], [place] [address]    Special Requests   Final    NONE Performed at [facility], [place] [address] [place], [place] [address]    Culture 30,000 COLONIES/mL GRAM NEGATIVE RODS (A)  Final   Report Status PENDING  Incomplete  SARS CORONAVIRUS 2 (TAT 6-24 HRS) Nasopharyngeal Nasopharyngeal Swab     Status: None   Collection Time: 01/24/19  5:42 PM   Specimen: Nasopharyngeal Swab  Result Value Ref Range Status   SARS Coronavirus 2 NEGATIVE NEGATIVE Final    Comment: (NOTE) SARS-CoV-2 target nucleic acids are NOT DETECTED. The SARS-CoV-2 RNA is generally detectable in upper and lower respiratory specimens during the acute phase of infection. Negative results do not preclude SARS-CoV-2 infection, do not rule out co-infections with other pathogens, and should not be used as the sole basis for treatment or other patient management decisions. Negative results must be combined with clinical observations, patient history, and epidemiological information. The expected result is Negative. Fact Sheet for Patients: SugarRoll.be Fact Sheet for Healthcare Providers: https://www.woods-mathews.com/ This test is not yet approved or cleared by the [place] FDA and  has been authorized for detection and/or diagnosis of SARS-CoV-2 by FDA under an Emergency Use Authorization (EUA). This EUA will remain  in effect (meaning this test can be used) for the duration of  the COVID-19 declaration under Section 56 4(b)(1) of the Act, 21 U.S.C. section 360bbb-3(b)(1), unless the authorization is terminated or revoked sooner. Performed at [facility], [place] [address] [place], [place] [address]       Studies: US Renal  Result Date: 01/24/2019 CLINICAL DATA:  Renal failure. EXAM: RENAL / URINARY TRACT ULTRASOUND COMPLETE COMPARISON:  Renal ultrasound 12/30/2018. FINDINGS: Right Kidney: Renal measurements: 10.7 x 4.3 x 4.0 cm = volume: 88.3 mL. Mild cortical thinning and increased cortical echogenicity. No focal lesion or hydronephrosis. Left Kidney: Renal measurements: 10.7 x 4.8 x 4.4 cm = volume: 117 mL. Echogenicity within normal limits. No mass or hydronephrosis visualized. Bladder: The bladder appears mildly distended with a volume of 101 cc. No focal abnormality identified. Patient unable to void for the examination. Other: None. IMPRESSION: 1. The kidneys are stable in size without hydronephrosis. 2. Mild cortical thinning and increased echogenicity on the right, suggesting medical renal disease. 3. Mildly distended urinary bladder. Electronically Signed   By: Richardean Sale M.D.   On: 01/24/2019 17:17    Scheduled Meds: . heparin  5,000 Units Subcutaneous Q8H  . levothyroxine  25 mcg Intravenous Daily  . pantoprazole  40 mg Oral Daily  . sertraline  25 mg Oral Daily  . sodium chloride flush  3 mL Intravenous Q12H  . thiamine  100 mg Oral Daily    Continuous Infusions: . cefTRIAXone (ROCEPHIN)  IV Stopped (01/25/19 1010)  .  sodium bicarbonate  infusion 1000 mL 100 mL/hr at 01/25/19 1010     LOS: 1 day     Alma Friendly, MD [facility]  If 7PM-7AM, please contact night-coverage [email or web address] 01/25/2019, 3:21 PM

## 2019-01-25 NOTE — ED Notes (Signed)
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 0.9 % 100 mL IVPB (0 g Intravenous  Stopped 01/24/19 1657)  sodium bicarbonate injection 50 mEq (50 mEq Intravenous Given 01/24/19 1610)  calcium gluconate 1 g in sodium chloride 0.9 % 100 mL IVPB (0 g Intravenous Stopped 01/24/19 1908)  lactulose (CHRONULAC) enema 200 gm (300 mLs Rectal Given 01/25/19 7445)    Mobility non-ambulatory

## 2019-01-25 NOTE — ED Notes (Signed)
Bruce Latino, MD to change synthroid order from PO to IV. Patient unable to tolerate PO meds at this time.

## 2019-01-25 NOTE — Progress Notes (Signed)
Patient ID: Bruce Hayes, male   DOB: 1945-10-01, 73 y.o.   MRN: [id number] [facility] Progress Note   Assessment/ Plan:   1. Acute kidney Injury on chronic kidney disease stage IV: Appears hemodynamically mediated with intravascular volume contraction in the setting of decreased oral intake with encephalopathy.  On isotonic sodium bicarbonate at this time, will reevaluate fluid choices based on subsequent labs.  Significantly elevated uric acid with normal CPK level-not indicative of pigment nephropathy.  No obstruction seen on renal ultrasound. 2.  Hyperkalemia: Secondary to acute kidney injury/impaired renal handling.  No evidence of rhabdomyolysis.  Ongoing medical management with limitations because of encephalopathy/inability to administer oral medications.  Potassium level trending down. 3.  Hypernatremia: Secondary to frank dehydration/diminished fluid intake in the setting of encephalopathy.  Monitor with fluids. 4.  Metabolic encephalopathy: Multifactorial with acute kidney injury/azotemia, elevated ammonia level and possible infection (suspected urinary source).  On broad-spectrum antimicrobial therapy and supportive management with intravenous fluids.  COVID-19 testing negative with reactive RPR, titer pending.  Subjective:   With emerging fever in the emergency room.  Large BM with enema.   Objective:   BP (!) 109/50   Pulse 70   Temp (!) 101 F (38.3 C) (Rectal)   Resp 13   SpO2 98%   Intake/Output Summary (Last 24 hours) at 01/25/2019 1232 Last data filed at 01/25/2019 1010 Gross per 24 hour  Intake 1495.8 ml  Output --  Net 1495.8 ml   Weight change:   Physical Exam: Gen: Somnolent, attempts to open eyes to calling out his name without verbal response. CVS: Pulse regular rhythm, normal rate, S1 and S2 normal Resp: Anteriorly clear to auscultation, no rales/rhonchi Abd: Soft, flat, nontender Ext: No lower extremity edema  Imaging: US  Renal  Result Date: 01/24/2019 CLINICAL DATA:  Renal failure. EXAM: RENAL / URINARY TRACT ULTRASOUND COMPLETE COMPARISON:  Renal ultrasound 12/30/2018. FINDINGS: Right Kidney: Renal measurements: 10.7 x 4.3 x 4.0 cm = volume: 88.3 mL. Mild cortical thinning and increased cortical echogenicity. No focal lesion or hydronephrosis. Left Kidney: Renal measurements: 10.7 x 4.8 x 4.4 cm = volume: 117 mL. Echogenicity within normal limits. No mass or hydronephrosis visualized. Bladder: The bladder appears mildly distended with a volume of 101 cc. No focal abnormality identified. Patient unable to void for the examination. Other: None. IMPRESSION: 1. The kidneys are stable in size without hydronephrosis. 2. Mild cortical thinning and increased echogenicity on the right, suggesting medical renal disease. 3. Mildly distended urinary bladder. Electronically Signed   By: Richardean Sale M.D.   On: 01/24/2019 17:17   DG Chest Port 1 View  Result Date: 01/24/2019 CLINICAL DATA:  73 year old patient in a rehabilitation facility presenting with acute mental status changes. EXAM: PORTABLE CHEST 1 VIEW COMPARISON:  10/30/2018 and earlier. FINDINGS: Suboptimal inspiration. Heart size normal for AP portable technique, unchanged. Lungs clear. Bronchovascular markings normal. Pulmonary vascularity normal. No visible pleural effusions. No pneumothorax. IMPRESSION: Suboptimal inspiration. No acute cardiopulmonary disease. Electronically Signed   By: Evangeline Dakin M.D.   On: 01/24/2019 14:30    Labs: BMET Recent Labs  Lab 01/24/19 1440 01/24/19 1447 01/25/19 0214 01/25/19 0517 01/25/19 0823  NA 148* 153* 148* 148*  --   K 6.1* 6.1* 5.6* 5.6* 5.1  CL 121* 123* 120* 119*  --   CO2 16*  --  19* 20*  --   GLUCOSE 89 84 144* 150*  --   BUN 80* 68* 80* 80*  --  CREATININE 3.63* 3.90* 3.33* 3.32*  --   CALCIUM 8.2*  --  7.6* 7.6*  --   PHOS  --   --  4.6 4.1  --    CBC Recent Labs  Lab 01/24/19 1440  01/24/19 1447 01/25/19 0214 01/25/19 0517  WBC 11.4*  --  12.5* 13.4*  NEUTROABS 8.9*  --   --   --   HGB 11.1* 10.2* 9.6* 9.0*  HCT 33.7* 30.0* 27.9* 26.4*  MCV 84.3  --  83.3 83.3  PLT 139*  --  131* 145*   Medications:    . heparin  5,000 Units Subcutaneous Q8H  . levothyroxine  25 mcg Intravenous Daily  . pantoprazole  40 mg Oral Daily  . sertraline  25 mg Oral Daily  . sodium chloride flush  3 mL Intravenous Q12H  . thiamine  100 mg Oral Daily   Elmarie Shiley, MD 01/25/2019, 12:32 PM

## 2019-01-26 LAB — CBC WITH DIFFERENTIAL/PLATELET
Abs Immature Granulocytes: 0.16 10*3/uL — ABNORMAL HIGH (ref 0.00–0.07)
Basophils Absolute: 0 10*3/uL (ref 0.0–0.1)
Basophils Relative: 0 %
Eosinophils Absolute: 0.2 10*3/uL (ref 0.0–0.5)
Eosinophils Relative: 2 %
HCT: 28.7 % — ABNORMAL LOW (ref 39.0–52.0)
Hemoglobin: 10 g/dL — ABNORMAL LOW (ref 13.0–17.0)
Immature Granulocytes: 1 %
Lymphocytes Relative: 19 %
Lymphs Abs: 2.1 10*3/uL (ref 0.7–4.0)
MCH: 28.2 pg (ref 26.0–34.0)
MCHC: 34.8 g/dL (ref 30.0–36.0)
MCV: 80.8 fL (ref 80.0–100.0)
Monocytes Absolute: 1.3 10*3/uL — ABNORMAL HIGH (ref 0.1–1.0)
Monocytes Relative: 12 %
Neutro Abs: 7.5 10*3/uL (ref 1.7–7.7)
Neutrophils Relative %: 66 %
Platelets: 85 10*3/uL — ABNORMAL LOW (ref 150–400)
RBC: 3.55 MIL/uL — ABNORMAL LOW (ref 4.22–5.81)
RDW: 16 % — ABNORMAL HIGH (ref 11.5–15.5)
WBC: 11.3 10*3/uL — ABNORMAL HIGH (ref 4.0–10.5)
nRBC: 0.4 % — ABNORMAL HIGH (ref 0.0–0.2)

## 2019-01-26 LAB — URINE CULTURE: Culture: 30000 — AB

## 2019-01-26 LAB — VANCOMYCIN, RANDOM: Vancomycin Rm: 11

## 2019-01-26 LAB — AMMONIA: Ammonia: 26 umol/L (ref 9–35)

## 2019-01-26 LAB — BASIC METABOLIC PANEL
Anion gap: 15 (ref 5–15)
BUN: 70 mg/dL — ABNORMAL HIGH (ref 8–23)
CO2: 25 mmol/L (ref 22–32)
Calcium: 7 mg/dL — ABNORMAL LOW (ref 8.9–10.3)
Chloride: 108 mmol/L (ref 98–111)
Creatinine, Ser: 3.22 mg/dL — ABNORMAL HIGH (ref 0.61–1.24)
GFR calc Af Amer: 21 mL/min — ABNORMAL LOW (ref >60)
GFR calc non Af Amer: 18 mL/min — ABNORMAL LOW (ref >60)
Glucose, Bld: 113 mg/dL — ABNORMAL HIGH (ref 70–99)
Potassium: 4.3 mmol/L (ref 3.5–5.1)
Sodium: 148 mmol/L — ABNORMAL HIGH (ref 135–145)

## 2019-01-26 LAB — HIV ANTIBODY (ROUTINE TESTING W REFLEX): HIV Screen 4th Generation wRfx: NONREACTIVE

## 2019-01-26 MED ORDER — DEXTROSE 5 % IV SOLN: INTRAVENOUS | Status: DC

## 2019-01-26 MED ORDER — HYDRALAZINE HCL 20 MG/ML IJ SOLN
10.0000 mg | Freq: Three times a day (TID) | INTRAMUSCULAR | Status: DC | PRN
  Administered 2019-01-26: 10 mg via INTRAVENOUS

## 2019-01-26 MED ORDER — HYDROMORPHONE HCL 1 MG/ML IJ SOLN
INTRAMUSCULAR | Status: AC
  Filled 2019-01-26: qty 1

## 2019-01-26 MED ORDER — HYDRALAZINE HCL 20 MG/ML IJ SOLN
INTRAMUSCULAR | Status: AC
  Filled 2019-01-26: qty 1

## 2019-01-26 MED ORDER — HYDROMORPHONE HCL 1 MG/ML IJ SOLN
0.5000 mg | Freq: Four times a day (QID) | INTRAMUSCULAR | Status: DC | PRN
  Administered 2019-01-26 (×2): 0.5 mg via INTRAVENOUS
  Filled 2019-01-26: qty 1

## 2019-01-26 MED ORDER — PENICILLIN G BENZATHINE 1200000 UNIT/2ML IM SUSP
2.4000 10*6.[IU] | INTRAMUSCULAR | Status: DC
  Administered 2019-01-26: 2.4 10*6.[IU] via INTRAMUSCULAR
  Filled 2019-01-26: qty 4

## 2019-01-26 NOTE — Progress Notes (Signed)
Patient ID: Bruce Hayes, male   DOB: Jul 06, 1945, 73 y.o.   MRN: [id number] [facility] Progress Note   Assessment/ Plan:   1. Acute kidney Injury on chronic kidney disease stage IV: Likely hemodynamically mediated with intravascular volume contraction from decreased oral intake in the setting of encephalopathy/possible UTI; possibly with downtrending creatinine seen on labs today.  Will readjust intravenous fluids as his metabolic acidosis is corrected. 2.  Hyperkalemia: Treated medically, secondary to acute kidney injury/impaired renal handling.  Without evidence of rhabdomyolysis. 3.  Hypernatremia: Secondary to frank dehydration/diminished fluid intake in the setting of encephalopathy.  Will readjust fluids as oral intake remains suboptimal. 4.  Metabolic encephalopathy: Multifactorial with acute kidney injury/azotemia, elevated ammonia level and possible infection (suspected urinary source).  Reactive RPR with 1:8 titer noted, on intravenous ceftriaxone for E. coli UTI.  Mental status slightly better than yesterday but likely not at baseline.  Subjective:   Without acute events overnight, remains febrile.   Objective:   BP (!) 188/48   Pulse 77   Temp 100.3 F (37.9 C) (Axillary)   Resp 13   Ht 6' (1.829 m)   Wt 81.4 kg   SpO2 100%   BMI 24.34 kg/m   Intake/Output Summary (Last 24 hours) at 01/26/2019 1041 Last data filed at 01/26/2019 0600 Gross per 24 hour  Intake 1965.17 ml  Output 650 ml  Net 1315.17 ml   Weight change:   Physical Exam: Gen: Awakens to calling out his name but without verbal response. CVS: Pulse regular rhythm, normal rate, S1 and S2 normal Resp: Anteriorly clear to auscultation, no rales/rhonchi Abd: Soft, flat, nontender Ext: No lower extremity edema  Imaging: US Renal  Result Date: 01/24/2019 CLINICAL DATA:  Renal failure. EXAM: RENAL / URINARY TRACT ULTRASOUND COMPLETE COMPARISON:  Renal ultrasound 12/30/2018. FINDINGS: Right  Kidney: Renal measurements: 10.7 x 4.3 x 4.0 cm = volume: 88.3 mL. Mild cortical thinning and increased cortical echogenicity. No focal lesion or hydronephrosis. Left Kidney: Renal measurements: 10.7 x 4.8 x 4.4 cm = volume: 117 mL. Echogenicity within normal limits. No mass or hydronephrosis visualized. Bladder: The bladder appears mildly distended with a volume of 101 cc. No focal abnormality identified. Patient unable to void for the examination. Other: None. IMPRESSION: 1. The kidneys are stable in size without hydronephrosis. 2. Mild cortical thinning and increased echogenicity on the right, suggesting medical renal disease. 3. Mildly distended urinary bladder. Electronically Signed   By: Richardean Sale M.D.   On: 01/24/2019 17:17   DG Chest Port 1 View  Result Date: 01/24/2019 CLINICAL DATA:  73 year old patient in a rehabilitation facility presenting with acute mental status changes. EXAM: PORTABLE CHEST 1 VIEW COMPARISON:  10/30/2018 and earlier. FINDINGS: Suboptimal inspiration. Heart size normal for AP portable technique, unchanged. Lungs clear. Bronchovascular markings normal. Pulmonary vascularity normal. No visible pleural effusions. No pneumothorax. IMPRESSION: Suboptimal inspiration. No acute cardiopulmonary disease. Electronically Signed   By: Evangeline Dakin M.D.   On: 01/24/2019 14:30    Labs: BMET Recent Labs  Lab 01/24/19 1440 01/24/19 1447 01/25/19 0214 01/25/19 0517 01/25/19 0823 01/25/19 1526 01/26/19 0238  NA 148* 153* 148* 148*  --  148* 148*  K 6.1* 6.1* 5.6* 5.6* 5.1 4.7 4.3  CL 121* 123* 120* 119*  --  116* 108  CO2 16*  --  19* 20*  --  22 25  GLUCOSE 89 84 144* 150*  --  150* 113*  BUN 80* 68* 80* 80*  --  80* 70*  CREATININE 3.63* 3.90* 3.33* 3.32*  --  3.45* 3.22*  CALCIUM 8.2*  --  7.6* 7.6*  --  7.4* 7.0*  PHOS  --   --  4.6 4.1  --   --   --    CBC Recent Labs  Lab 01/24/19 1440 01/24/19 1447 01/25/19 0214 01/25/19 0517 01/26/19 0238  WBC  11.4*  --  12.5* 13.4* 11.3*  NEUTROABS 8.9*  --   --   --  7.5  HGB 11.1* 10.2* 9.6* 9.0* 10.0*  HCT 33.7* 30.0* 27.9* 26.4* 28.7*  MCV 84.3  --  83.3 83.3 80.8  PLT 139*  --  131* 145* 85*   Medications:    . Chlorhexidine Gluconate Cloth  6 each Topical Daily  . heparin  5,000 Units Subcutaneous Q8H  . levothyroxine  25 mcg Intravenous Daily  . mouth rinse  15 mL Mouth Rinse BID  . pantoprazole (PROTONIX) IV  40 mg Intravenous Q24H  . sertraline  25 mg Oral Daily  . sodium chloride flush  3 mL Intravenous Q12H   Elmarie Shiley, MD 01/26/2019, 10:41 AM

## 2019-01-26 NOTE — Progress Notes (Signed)
PROGRESS NOTE  Bruce Hayes:[id number] DOB: 31-Jul-1945 DOA: 01/24/2019 PCP: Lorene Dy, MD  HPI/Recap of past 24 hours: HPI from Dr Mindi Junker Bruce Hayes is a 73 y.o. male with medical history significant for alcohol/HCV cirrhosis, CAD, hypertension, history of substance abuse, hyperlipidemia, coming from SNF presents with fever, 3 days of progressive altered mental status to the point where he is currently nonverbal, despite being usually conversational. Pt was recently admitted from 11/29 to 12/3 for an AKI with associated hyperkalemia. History is corroborated by the nursing home and EMS personnel, in addition to EDP as pt is altered. In the ED, pt was febrile to 103, intermittent soft blood pressures 90/60, saturating 100% on room air. Initial labwork notable for potassium 6.1, chloride 123, bicarb 16 lactic acid 3.7, ammonia 63. Urinalysis with many bacteria, moderate leukocytes.  Imaging studies notable for no acute cardiopulmonary disease on chest x-ray.  Renal ultrasound with no evidence of hydronephrosis.  Patient admitted for further management     Today, patient seen and examined at bedside.  Still lethargic, but responds to name.  Unable to have a meaningful conversation.  Noted to be moaning, BP elevated, ?? pain    Assessment/Plan: Active Problems:   Encephalopathy  Sepsis likely 2/2 E. coli bacteremia from possible UTI Still febrile with leukocytosis Lactic acid still elevated, will trend UC grew E. coli BC x2 growing E. Coli Renal ultrasound no evidence of hydronephrosis Chest x-ray unremarkable Continue IV ceftriaxone 2 g IV fluids Monitor closely  Acute metabolic encephalopathy Multifactorial, likely due to sepsis, azotemia, elevated ammonia Management as above  AKI on CKD stage IV/hyperkalemia/elevated uric acid/metabolic acidosis Likely 2/2 sepsis, with poor oral intake in the setting of encephalopathy Baseline creatinine around 2.5 Potassium  trending down Nephrology on board, s/p bicarb drip, switched to D5W to correct hypernatremia  Anemia of chronic kidney disease Baseline hemoglobin around 10-11 No evidence of bleeding Daily CBC  Hypernatremia  Likely 2/2 poor oral intake Continue IV fluids, D5W as above  Alcoholic/HCV cirrhosis Ammonia elevated on admission, trended down S/p 1 dose of lactulose enema on 01/25/19 Hold home p.o. lactulose for now, will use lactulose enema if needed  RPR reactive/??  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 Johnnye Sima on 01/26/2019, will treat with 2 doses of penicillin, 1 week apart HIV test pending  Hypertension BP currently soft Hold home Coreg, clonidine for now, hydralazine as needed         Malnutrition Type:      Malnutrition Characteristics:      Nutrition Interventions:       Estimated body mass index is 24.34 kg/m as calculated from the following:   Height as of this encounter: 6' (1.829 m).   Weight as of this encounter: 81.4 kg.     Code Status: DNR  Family Communication: None at bedside  Disposition Plan: Likely back to SNF   Consultants:  Nephrology  Procedures:  None  Antimicrobials:  Ceftriaxone  Penicillin G  DVT prophylaxis: Heparin   Objective: Vitals:   01/26/19 1300 01/26/19 1400 01/26/19 1500 01/26/19 1528  BP: (!) 150/39 (!) 195/75 (!) 129/38   Pulse: 75 95 72   Resp: 14 (!) 24 12   Temp:    99.9 F (37.7 C)  TempSrc:    Oral  SpO2: 97% 100% 97%   Weight:      Height:        Intake/Output Summary (Last 24 hours) at 01/26/2019  [place] filed at 01/26/2019 0600 Gross per 24 hour  Intake 1965.17 ml  Output 650 ml  Net 1315.17 ml   Filed Weights   01/25/19 1530  Weight: 81.4 kg    Exam:  General: NAD, chronically ill-appearing, lethargic, not oriented  Cardiovascular: S1, S2 present  Respiratory: CTAB  Abdomen: Soft, nontender, nondistended, bowel sounds  present  Musculoskeletal: No bilateral pedal edema noted  Skin: Normal  Psychiatry:  Unable to assess   Data Reviewed: CBC: Recent Labs  Lab 01/24/19 1440 01/24/19 1447 01/25/19 0214 01/25/19 0517 01/26/19 0238  WBC 11.4*  --  12.5* 13.4* 11.3*  NEUTROABS 8.9*  --   --   --  7.5  HGB 11.1* 10.2* 9.6* 9.0* 10.0*  HCT 33.7* 30.0* 27.9* 26.4* 28.7*  MCV 84.3  --  83.3 83.3 80.8  PLT 139*  --  131* 145* 85*   Basic Metabolic Panel: Recent Labs  Lab 01/24/19 1440 01/24/19 1447 01/25/19 0214 01/25/19 0517 01/25/19 0823 01/25/19 1526 01/26/19 0238  NA 148* 153* 148* 148*  --  148* 148*  K 6.1* 6.1* 5.6* 5.6* 5.1 4.7 4.3  CL 121* 123* 120* 119*  --  116* 108  CO2 16*  --  19* 20*  --  22 25  GLUCOSE 89 84 144* 150*  --  150* 113*  BUN 80* 68* 80* 80*  --  80* 70*  CREATININE 3.63* 3.90* 3.33* 3.32*  --  3.45* 3.22*  CALCIUM 8.2*  --  7.6* 7.6*  --  7.4* 7.0*  MG  --   --  2.7*  --   --   --   --   PHOS  --   --  4.6 4.1  --   --   --    GFR: Estimated Creatinine Clearance: 23.1 mL/min (A) (by C-G formula based on SCr of 3.22 mg/dL (H)). Liver Function Tests: Recent Labs  Lab 01/24/19 1440 01/25/19 0517  AST 53*  --   ALT 35  --   ALKPHOS 166*  --   BILITOT 3.5*  --   PROT 7.8  --   ALBUMIN 1.9* 1.6*   No results for input(s): LIPASE, AMYLASE in the last 168 hours. Recent Labs  Lab 01/24/19 1440 01/26/19 0238  AMMONIA 63* 26   Coagulation Profile: No results for input(s): INR, PROTIME in the last 168 hours. Cardiac Enzymes: Recent Labs  Lab 01/25/19 0517  CKTOTAL 102   BNP (last 3 results) No results for input(s): PROBNP in the last 8760 hours. HbA1C: No results for input(s): HGBA1C in the last 72 hours. CBG: Recent Labs  Lab 01/24/19 1350 01/24/19 1740  GLUCAP 68* 107*   Lipid Profile: No results for input(s): CHOL, HDL, LDLCALC, TRIG, CHOLHDL, LDLDIRECT in the last 72 hours. Thyroid Function Tests: Recent Labs    01/25/19 0214  TSH  2.623   Anemia Panel: No results for input(s): VITAMINB12, FOLATE, FERRITIN, TIBC, IRON, RETICCTPCT in the last 72 hours. Urine analysis:    Component Value Date/Time   COLORURINE AMBER (A) 01/24/2019 1649   APPEARANCEUR HAZY (A) 01/24/2019 1649   LABSPEC 1.014 01/24/2019 1649   PHURINE 5.0 01/24/2019 1649   GLUCOSEU NEGATIVE 01/24/2019 1649   HGBUR LARGE (A) 01/24/2019 1649   BILIRUBINUR NEGATIVE 01/24/2019 1649   KETONESUR NEGATIVE 01/24/2019 1649   PROTEINUR 30 (A) 01/24/2019 1649   UROBILINOGEN 4.0 (H) 03/31/2014 0920   NITRITE NEGATIVE 01/24/2019 1649   LEUKOCYTESUR MODERATE (A) 01/24/2019 1649  Sepsis Labs: @LABRCNTIP (procalcitonin:4,lacticidven:4)  ) Recent Results (from the past 240 hour(s))  Blood culture (routine x 2)     Status: Abnormal (Preliminary result)   Collection Time: 01/24/19  2:40 PM   Specimen: BLOOD  Result Value Ref Range Status   Specimen Description   Final    BLOOD RIGHT ANTECUBITAL Performed at [facility] [address], [place], [place] [address]    Special Requests   Final    BOTTLES DRAWN AEROBIC AND ANAEROBIC Blood Culture results may not be optimal due to an inadequate volume of blood received in culture bottles Performed at [facility] [address], [place], [place] [address]    Culture  Setup Time   Final    GRAM NEGATIVE RODS IN BOTH AEROBIC AND ANAEROBIC BOTTLES CRITICAL RESULT CALLED TO, READ BACK BY AND VERIFIED WITH: PHARMD ANH PHAM [phone] [id number] FCP    Culture (A)  Final    ESCHERICHIA COLI SUSCEPTIBILITIES TO FOLLOW Performed at [facility], [place] [address] [place], [place] [address]    Report Status PENDING  Incomplete  Blood culture (routine x 2)     Status: None (Preliminary result)   Collection Time: 01/24/19  2:40 PM   Specimen: BLOOD RIGHT ARM  Result Value Ref Range Status   Specimen Description   Final    BLOOD RIGHT ARM Performed at [facility], [place] [address], [place], [place] [address]    Special Requests   Final    BOTTLES DRAWN AEROBIC AND ANAEROBIC Blood Culture results may not be optimal due to an inadequate volume of blood received in culture bottles Performed at [facility] [address] [place], [place] [address]    Culture  Setup Time   Final    GRAM NEGATIVE RODS IN BOTH AEROBIC AND ANAEROBIC BOTTLES CRITICAL VALUE NOTED.  VALUE IS CONSISTENT WITH PREVIOUSLY REPORTED AND CALLED VALUE.    Culture   Final    GRAM NEGATIVE RODS IDENTIFICATION TO FOLLOW Performed at [facility], [place] [address], [place], [place] [address]    Report Status PENDING  Incomplete  Blood Culture ID Panel (Reflexed)     Status: Abnormal   Collection Time: 01/24/19  2:40 PM  Result Value Ref Range Status   Enterococcus species NOT DETECTED NOT DETECTED Final   Listeria monocytogenes NOT DETECTED NOT DETECTED Final   Staphylococcus species NOT DETECTED NOT DETECTED Final   Staphylococcus aureus (BCID) NOT DETECTED NOT DETECTED Final   Streptococcus species NOT DETECTED NOT DETECTED Final   Streptococcus agalactiae NOT DETECTED NOT DETECTED Final   Streptococcus pneumoniae NOT DETECTED NOT DETECTED Final   Streptococcus pyogenes NOT DETECTED NOT DETECTED Final   Acinetobacter baumannii NOT DETECTED NOT DETECTED Final   Enterobacteriaceae species DETECTED (A) NOT DETECTED Final    Comment: Enterobacteriaceae represent a large family of gram-negative bacteria, not a single organism. CRITICAL RESULT CALLED TO, READ BACK BY AND VERIFIED WITH: PHARMD ANH PHAM [id number] FCP    Enterobacter cloacae complex NOT DETECTED NOT DETECTED Final   Escherichia coli DETECTED (A) NOT DETECTED Final    Comment: CRITICAL RESULT CALLED TO, READ BACK BY AND VERIFIED WITH: PHARMD ANH PHAM [id number] [id number] FCP    Klebsiella oxytoca NOT DETECTED NOT DETECTED Final   Klebsiella pneumoniae NOT DETECTED NOT DETECTED Final   Proteus species NOT  DETECTED NOT DETECTED Final   Serratia marcescens NOT DETECTED NOT DETECTED Final  Carbapenem resistance NOT DETECTED NOT DETECTED Final   Haemophilus influenzae NOT DETECTED NOT DETECTED Final   Neisseria meningitidis NOT DETECTED NOT DETECTED Final   Pseudomonas aeruginosa NOT DETECTED NOT DETECTED Final   Candida albicans NOT DETECTED NOT DETECTED Final   Candida glabrata NOT DETECTED NOT DETECTED Final   Candida krusei NOT DETECTED NOT DETECTED Final   Candida parapsilosis NOT DETECTED NOT DETECTED Final   Candida tropicalis NOT DETECTED NOT DETECTED Final    Comment: Performed at [facility], [place] [address], [place], [place] [address]  Urine culture     Status: Abnormal   Collection Time: 01/24/19  4:48 PM   Specimen: Urine, Clean Catch  Result Value Ref Range Status   Specimen Description   Final    URINE, CLEAN CATCH Performed at [facility], [place] [address] [place], [place] [address]    Special Requests   Final    NONE Performed at [facility], [place] [address] [place], [place] [address]    Culture 30,000 COLONIES/mL ESCHERICHIA COLI (A)  Final   Report Status 01/26/2019 FINAL  Final   Organism ID, Bacteria ESCHERICHIA COLI (A)  Final      Susceptibility   Escherichia coli - MIC*    AMPICILLIN <=2 SENSITIVE Sensitive     CEFAZOLIN <=4 SENSITIVE Sensitive     CEFTRIAXONE <=1 SENSITIVE Sensitive     CIPROFLOXACIN <=0.25 SENSITIVE Sensitive     GENTAMICIN <=1 SENSITIVE Sensitive     IMIPENEM <=0.25 SENSITIVE Sensitive     NITROFURANTOIN <=16 SENSITIVE Sensitive     TRIMETH/SULFA <=20 SENSITIVE Sensitive     AMPICILLIN/SULBACTAM <=2 SENSITIVE Sensitive     PIP/TAZO <=4 SENSITIVE Sensitive     * 30,000 COLONIES/mL ESCHERICHIA COLI  SARS CORONAVIRUS 2 (TAT 6-24 HRS) Nasopharyngeal Nasopharyngeal Swab     Status: None   Collection Time: 01/24/19  5:42 PM   Specimen: Nasopharyngeal Swab  Result Value Ref Range Status   SARS  Coronavirus 2 NEGATIVE NEGATIVE Final    Comment: (NOTE) SARS-CoV-2 target nucleic acids are NOT DETECTED. The SARS-CoV-2 RNA is generally detectable in upper and lower respiratory specimens during the acute phase of infection. Negative results do not preclude SARS-CoV-2 infection, do not rule out co-infections with other pathogens, and should not be used as the sole basis for treatment or other patient management decisions. Negative results must be combined with clinical observations, patient history, and epidemiological information. The expected result is Negative. Fact Sheet for Patients: SugarRoll.be Fact Sheet for Healthcare Providers: https://www.woods-mathews.com/ This test is not yet approved or cleared by the [place] FDA and  has been authorized for detection and/or diagnosis of SARS-CoV-2 by FDA under an Emergency Use Authorization (EUA). This EUA will remain  in effect (meaning this test can be used) for the duration of the COVID-19 declaration under Section 56 4(b)(1) of the Act, 21 U.S.C. section 360bbb-3(b)(1), unless the authorization is terminated or revoked sooner. Performed at [facility], [place] [address], [place], [place] [address]       Studies: No results found.  Scheduled Meds: . Chlorhexidine Gluconate Cloth  6 each Topical Daily  . heparin  5,000 Units Subcutaneous Q8H  . levothyroxine  25 mcg Intravenous Daily  . mouth rinse  15 mL Mouth Rinse BID  . pantoprazole (PROTONIX) IV  40 mg Intravenous Q24H  . sertraline  25 mg Oral Daily  . sodium chloride flush  3 mL Intravenous Q12H  Continuous Infusions: . cefTRIAXone (ROCEPHIN)  IV Stopped (01/26/19 1033)  . dextrose 100 mL/hr at 01/26/19 1235     LOS: 2 days     Alma Friendly, MD [facility]  If 7PM-7AM, please contact night-coverage [email or web address] 01/26/2019, 3:43 PM
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
## 2019-01-26 NOTE — Progress Notes (Signed)
OT Cancellation Note  Patient Details Name: Bruce Hayes MRN: [id number] DOB: 04-28-1945   Cancelled Treatment:    Reason Eval/Treat Not Completed: OT will defer OT eval to SNF  Kari Baars, [place] [phone] Office- [phone], Edwena Felty D 01/26/2019, 12:45 PM

## 2019-01-26 NOTE — Progress Notes (Signed)
Notified on call provider of morning lab results. Critical elevated lactic acid drawn at 1900 not reported to nursing staff. Also notified on call provider of this finding.

## 2019-01-27 ENCOUNTER — Encounter (HOSPITAL_COMMUNITY): Payer: Self-pay | Admitting: Internal Medicine

## 2019-01-27 ENCOUNTER — Other Ambulatory Visit: Payer: Self-pay

## 2019-01-27 ENCOUNTER — Inpatient Hospital Stay (HOSPITAL_COMMUNITY): Payer: Medicare Other

## 2019-01-27 DIAGNOSIS — A419 Sepsis, unspecified organism: Secondary | ICD-10-CM | POA: Diagnosis present

## 2019-01-27 DIAGNOSIS — R7881 Bacteremia: Secondary | ICD-10-CM | POA: Diagnosis present

## 2019-01-27 DIAGNOSIS — B1710 Acute hepatitis C without hepatic coma: Secondary | ICD-10-CM

## 2019-01-27 DIAGNOSIS — N184 Chronic kidney disease, stage 4 (severe): Secondary | ICD-10-CM

## 2019-01-27 DIAGNOSIS — N189 Chronic kidney disease, unspecified: Secondary | ICD-10-CM

## 2019-01-27 DIAGNOSIS — N390 Urinary tract infection, site not specified: Secondary | ICD-10-CM | POA: Diagnosis present

## 2019-01-27 DIAGNOSIS — Z862 Personal history of diseases of the blood and blood-forming organs and certain disorders involving the immune mechanism: Secondary | ICD-10-CM

## 2019-01-27 DIAGNOSIS — A530 Latent syphilis, unspecified as early or late: Secondary | ICD-10-CM | POA: Diagnosis present

## 2019-01-27 DIAGNOSIS — B9620 Unspecified Escherichia coli [E. coli] as the cause of diseases classified elsewhere: Secondary | ICD-10-CM

## 2019-01-27 LAB — CULTURE, BLOOD (ROUTINE X 2)

## 2019-01-27 LAB — RENAL FUNCTION PANEL
Albumin: 1.5 g/dL — ABNORMAL LOW (ref 3.5–5.0)
Anion gap: 11 (ref 5–15)
BUN: 67 mg/dL — ABNORMAL HIGH (ref 8–23)
CO2: 25 mmol/L (ref 22–32)
Calcium: 7.4 mg/dL — ABNORMAL LOW (ref 8.9–10.3)
Chloride: 109 mmol/L (ref 98–111)
Creatinine, Ser: 3.19 mg/dL — ABNORMAL HIGH (ref 0.61–1.24)
GFR calc Af Amer: 21 mL/min — ABNORMAL LOW (ref >60)
GFR calc non Af Amer: 19 mL/min — ABNORMAL LOW (ref >60)
Glucose, Bld: 126 mg/dL — ABNORMAL HIGH (ref 70–99)
Phosphorus: 3.9 mg/dL (ref 2.5–4.6)
Potassium: 3.7 mmol/L (ref 3.5–5.1)
Sodium: 145 mmol/L (ref 135–145)

## 2019-01-27 LAB — CBC WITH DIFFERENTIAL/PLATELET
Abs Immature Granulocytes: 0.17 10*3/uL — ABNORMAL HIGH (ref 0.00–0.07)
Basophils Absolute: 0.1 10*3/uL (ref 0.0–0.1)
Basophils Relative: 1 %
Eosinophils Absolute: 0.2 10*3/uL (ref 0.0–0.5)
Eosinophils Relative: 2 %
HCT: 27.7 % — ABNORMAL LOW (ref 39.0–52.0)
Hemoglobin: 9.3 g/dL — ABNORMAL LOW (ref 13.0–17.0)
Immature Granulocytes: 2 %
Lymphocytes Relative: 23 %
Lymphs Abs: 2 10*3/uL (ref 0.7–4.0)
MCH: 27.9 pg (ref 26.0–34.0)
MCHC: 33.6 g/dL (ref 30.0–36.0)
MCV: 83.2 fL (ref 80.0–100.0)
Monocytes Absolute: 1.3 10*3/uL — ABNORMAL HIGH (ref 0.1–1.0)
Monocytes Relative: 15 %
Neutro Abs: 5 10*3/uL (ref 1.7–7.7)
Neutrophils Relative %: 57 %
Platelets: 89 10*3/uL — ABNORMAL LOW (ref 150–400)
RBC: 3.33 MIL/uL — ABNORMAL LOW (ref 4.22–5.81)
RDW: 16.6 % — ABNORMAL HIGH (ref 11.5–15.5)
WBC: 8.7 10*3/uL (ref 4.0–10.5)
nRBC: 0.3 % — ABNORMAL HIGH (ref 0.0–0.2)

## 2019-01-27 LAB — T.PALLIDUM AB, TOTAL: T Pallidum Abs: NONREACTIVE

## 2019-01-27 MED ORDER — CLONIDINE HCL 0.3 MG/24HR TD PTWK
0.3000 mg | MEDICATED_PATCH | TRANSDERMAL | Status: DC
  Administered 2019-01-27: 0.3 mg via TRANSDERMAL
  Filled 2019-01-27: qty 1

## 2019-01-27 MED ORDER — LACTULOSE 10 GM/15ML PO SOLN
20.0000 g | Freq: Two times a day (BID) | ORAL | Status: DC
  Administered 2019-01-27 – 2019-01-29 (×5): 20 g via ORAL
  Filled 2019-01-27 (×9): qty 30

## 2019-01-27 MED ORDER — BOOST / RESOURCE BREEZE PO LIQD CUSTOM
1.0000 | ORAL | Status: DC
  Administered 2019-01-27 – 2019-01-30 (×4): 1 via ORAL

## 2019-01-27 MED ORDER — LEVOTHYROXINE SODIUM 50 MCG PO TABS
50.0000 ug | ORAL_TABLET | Freq: Every day | ORAL | Status: DC
  Administered 2019-01-28 – 2019-01-31 (×4): 50 ug via ORAL
  Filled 2019-01-27 (×4): qty 1

## 2019-01-27 MED ORDER — CARVEDILOL 3.125 MG PO TABS
3.1250 mg | ORAL_TABLET | Freq: Two times a day (BID) | ORAL | Status: DC
  Administered 2019-01-27 – 2019-01-31 (×8): 3.125 mg via ORAL
  Filled 2019-01-27 (×7): qty 1

## 2019-01-27 MED ORDER — PRO-STAT SUGAR FREE PO LIQD
30.0000 mL | Freq: Three times a day (TID) | ORAL | Status: DC
  Administered 2019-01-28 – 2019-01-31 (×7): 30 mL via ORAL
  Filled 2019-01-27 (×9): qty 30

## 2019-01-27 NOTE — Progress Notes (Signed)
Patient ID: Bruce Hayes, male   DOB: 22-Apr-1945, 73 y.o.   MRN: [id number] [facility] Progress Note   Assessment/ Plan:   1. Acute kidney Injury on chronic kidney disease stage IV: Likely hemodynamically mediated with intravascular volume contraction from decreased oral intake in the setting of encephalopathy/possible UTI.  Creat improving some over last 48hrs. Will continue intravenous fluids.  2.  Hyperkalemia: Treated medically, secondary to acute kidney injury/impaired renal handling.  Without evidence of rhabdomyolysis. 3.  Hypernatremia: Secondary to frank dehydration/diminished fluid intake in the setting of encephalopathy.  Will readjust fluids as oral intake remains suboptimal. 4.  Metabolic encephalopathy: Multifactorial with acute kidney injury/azotemia, elevated ammonia level and possible infection (suspected urinary source).  Reactive RPR with 1:8 titer noted, on intravenous ceftriaxone for E. coli UTI.  Mental status seems to be improving.  Bruce Splinter, MD 01/27/2019, 9:57 PM   Subjective:   No new c/o's.    Objective:   BP (!) 137/54 (BP Location: Right Arm)   Pulse (!) 59   Temp 97.8 F (36.6 C) (Oral)   Resp 15   Ht 6' (1.829 m)   Wt 81.4 kg   SpO2 100%   BMI 24.34 kg/m   Intake/Output Summary (Last 24 hours) at 01/27/2019 2155 Last data filed at 01/27/2019 1856 Gross per 24 hour  Intake 840 ml  Output 1075 ml  Net -235 ml   Weight change:   Physical Exam: Gen: Awakens to calling out his name but without verbal response. CVS: Pulse regular rhythm, normal rate, S1 and S2 normal Resp: Anteriorly clear to auscultation, no rales/rhonchi Abd: Soft, flat, nontender Ext: No lower extremity edema   Medications:    . carvedilol  3.125 mg Oral BID WC  . Chlorhexidine Gluconate Cloth  6 each Topical Daily  . cloNIDine  0.3 mg Transdermal Weekly  . feeding supplement  1 Container Oral Q24H  . feeding supplement (PRO-STAT SUGAR FREE 64)  30  mL Oral TID BM  . heparin  5,000 Units Subcutaneous Q8H  . lactulose  20 g Oral BID  . [START ON 01/28/2019] levothyroxine  50 mcg Oral QAC breakfast  . mouth rinse  15 mL Mouth Rinse BID  . pantoprazole (PROTONIX) IV  40 mg Intravenous Q24H  . penicillin g benzathine (BICILLIN-LA) IM  2.4 Million Units Intramuscular Weekly  . sertraline  25 mg Oral Daily  . sodium chloride flush  3 mL Intravenous Q12H

## 2019-01-27 NOTE — Progress Notes (Signed)
Initial Nutrition Assessment  DOCUMENTATION CODES:   Not applicable  INTERVENTION:  - will order Boost Breeze once/day, each supplement provides 250 kcal and 9 grams of protein. - will order 30 mL Prostat TID, each supplement provides 100 kcal and 15 grams of protein.    NUTRITION DIAGNOSIS:   Increased nutrient needs related to acute illness(sepsis) as evidenced by estimated needs.  GOAL:   Patient will meet greater than or equal to 90% of their needs  MONITOR:   PO intake, Supplement acceptance, Labs, Weight trends, Skin  REASON FOR ASSESSMENT:   Malnutrition Screening Tool  ASSESSMENT:   73 y.o. male with medical history significant for alcohol/HCV cirrhosis, CAD, HTN, history of substance abuse, and hyperlipidemia. He presented to the ED from SNF with a fever and 3 days of progressive AMS/confusion to the point of being non-verbal. He was admitted 11/29-12/3 for AKI and hyperkalemia. In the ED his fever was up to 103 degrees F, serum K was 6.1 mmol/l, and serum Cl was 123 mmol/l. No signs of cardiopulmonary disease or hydronephrosis.  No intakes documented since admission. Patient states that he ate all of his pancakes and sausage for breakfast this AM. He resides at [facility] and reports eating 2-3 meals/day and that he does often receive snacks each day. He denies any chewing or swallowing issues at baseline or with breakfast this AM. He denies abdominal pain/pressure or nausea after breakfast.   He walks unassisted at baseline and has not noticed any changes in energy level or early-onset weakness when ambulating. He does not feel that he has lost any weight recently. Per chart review, current weight is 179 lb, weight on 12/3 was 192, and weight on 10/6 was 219 lb. This indicates 13 lb weight loss (6.8% body weight) in the past 4 weeks; significant for time frame. This also indicates 40 lb weight loss (18% body weight) in the past ~3 months; significant for time frame. Will monitor  weight trends closely. Suspect at least part of this weight loss could be fluid related.  Per notes: - sepsis thought to be 2/2 bacteremia from possible UTI--ongoing fever with leukocytosis; CXR unremarkable - acute metabolic encephalopathy--thought to be d/t sepsis, azotemia, elevated ammonia--improved/improving with patient now a/o x3 - AKI on stage 4 CKD - hyperkalemia--resolved  - metabolic acidosis - anemia of CKD - HIV test pending    Labs reviewed; BUN: 67 mg/dl, creatinine: 3.19 mg/dl, Ca: 7.4 mg/dl, GFR: 21 ml/min. Medications reviewed; 50 mcg oral synthroid/day, 20 g lactulose BID, 40 mg IV protonix/day. IVF; D5 @ 100 ml/hr (408 kcal).    NUTRITION - FOCUSED PHYSICAL EXAM:    Most Recent Value  Orbital Region  No depletion  Upper Arm Region  No depletion  Thoracic and Lumbar Region  No depletion  Buccal Region  Mild depletion  Temple Region  No depletion  Clavicle Bone Region  Mild depletion  Clavicle and Acromion Bone Region  Mild depletion  Scapular Bone Region  Unable to assess  Dorsal Hand  No depletion  Patellar Region  No depletion  Anterior Thigh Region  Unable to assess  Posterior Calf Region  No depletion  Edema (RD Assessment)  Mild [BLE]  Hair  Reviewed  Eyes  Reviewed  Mouth  Reviewed  Skin  Reviewed  Nails  Reviewed       Diet Order:   Diet Order            Diet renal/carb modified with fluid restriction Diet-HS Snack? Nothing;  Fluid restriction: 1200 mL Fluid; Room service appropriate? Yes; Fluid consistency: Thin  Diet effective now              EDUCATION NEEDS:   No education needs have been identified at this time  Skin:  Skin Assessment: Skin Integrity Issues: Skin Integrity Issues:: DTI, Stage II DTI: L heel Stage II: bilateral buttocks  Last BM:  PTA/unknown  Height:   Ht Readings from Last 1 Encounters:  01/25/19 6' (1.829 m)    Weight:   Wt Readings from Last 1 Encounters:  01/25/19 81.4 kg    Ideal Body  Weight:  80.9 kg  BMI:  Body mass index is 24.34 kg/m.  Estimated Nutritional Needs:   Kcal:  2035-2280 kcal  Protein:  90-100 grams  Fluid:  >/= 1.5 L/day     Jarome Matin, MS, RD, LDN, [facility] Inpatient Clinical Dietitian Pager # [phone] After hours/weekend pager # [phone]

## 2019-01-27 NOTE — Progress Notes (Signed)
PROGRESS NOTE    Bruce Hayes  TML:[id number] DOB: Nov 15, 1945 DOA: 01/24/2019 PCP: Lorene Dy, MD    Brief Narrative:  73 year old male chronic kidney disease stage IV, cirrhosis, hypertension, resident of a skilled nursing facility was brought to the hospital with 3 days of progressive altered mental status to the point that he became nonverbal.  He was found to have acute kidney injury with associated hyperkalemia.  He was febrile, hypotensive and had elevated lactic acid.  Urinalysis indicated possible infection.  He was admitted for further management of sepsis secondary to urinary tract infection.   Assessment & Plan:   Active Problems:   HEPATITIS C   THROMBOCYTOPENIA   Essential hypertension   AKI (acute kidney injury) ([place])   Other cirrhosis of liver (HCC)   Encephalopathy   Sepsis ([place])   E coli bacteremia   Urinary tract infection associated with catheterization of urinary tract, initial encounter ([place])   CKD (chronic kidney disease), stage IV (HCC)   Positive RPR test   History of anemia due to chronic kidney disease   1. Sepsis secondary to E. coli bacteremia from urinary tract infection.  Currently on Rocephin.  Renal ultrasound does not show any evidence of hydronephrosis.  Continue current treatments. 2. Acute metabolic encephalopathy.  Secondary to sepsis, azotemia, hepatic encephalopathy.  Overall ammonia has improved and sepsis has also improved.  Mental status appears to be approaching baseline. 3. Acute kidney injury on chronic kidney disease stage IV.  Secondary to sepsis.  Baseline creatinine approximately 2.5.  Nephrology following and he was briefly placed on a bicarb drip due to metabolic acidosis.  Overall renal function is improving. 4. Anemia of chronic kidney disease.  Baseline hemoglobin 10-11.  No evidence of bleeding. 5. Hyponatremia.  Secondary to poor oral intake. 6. Alcoholic/hepatitis C cirrhosis.  Ammonia level has  improved. 7. Hypertension.  Blood pressure trending up.  Resume Coreg and clonidine patch. 8. Positive RPR.  Reviewed with infectious disease who recommended 2 doses of penicillin, 1 week apart.  First dose was on 12/27. 9. Deconditioning.  Seen by physical therapy with recommendations for skilled nursing facility placement.   DVT prophylaxis: Heparin Code Status: DNR Family Communication: None present Disposition Plan: Transfer out of stepdown today.  Can consider discharge back to skilled nursing facility in the next 24 to 48 hours if renal function continues to improve.   Consultants:   Nephrology  Procedures:     Antimicrobials:   Ceftriaxone  Penicillin G   Subjective: Patient is sitting up in bed.  He knows he is in the hospital.  He knows that Christmas recently passed.  Denies any significant pain at this time.  Objective: Vitals:   01/27/19 1100 01/27/19 1200 01/27/19 1229 01/27/19 1416  BP: (!) 164/52  (!) 151/64 (!) 143/58  Pulse: 65  65 64  Resp: 13  16 16   Temp:  98.2 F (36.8 C) 97.7 F (36.5 C) 97.6 F (36.4 C)  TempSrc:  Oral Oral Oral  SpO2: 98%  100% 100%  Weight:      Height:        Intake/Output Summary (Last 24 hours) at 01/27/2019 1957 Last data filed at 01/27/2019 1856 Gross per 24 hour  Intake 1040 ml  Output 1075 ml  Net -35 ml   Filed Weights   01/25/19 1530  Weight: 81.4 kg    Examination:  General exam: Appears calm and comfortable  Respiratory system: Clear to auscultation. Respiratory effort normal. Cardiovascular system:  S1 & S2 heard, RRR. No JVD, murmurs, rubs, gallops or clicks. No pedal edema. Gastrointestinal system: Abdomen is nondistended, soft and nontender. No organomegaly or masses felt. Normal bowel sounds heard. Central nervous system: No focal neurological deficits. Extremities: Symmetric 5 x 5 power. Skin: No rashes, lesions or ulcers Psychiatry: Pleasant, confused    Data Reviewed: I have personally  reviewed following labs and imaging studies  CBC: Recent Labs  Lab 01/24/19 1440 01/24/19 1447 01/25/19 0214 01/25/19 0517 01/26/19 0238 01/27/19 0248  WBC 11.4*  --  12.5* 13.4* 11.3* 8.7  NEUTROABS 8.9*  --   --   --  7.5 5.0  HGB 11.1* 10.2* 9.6* 9.0* 10.0* 9.3*  HCT 33.7* 30.0* 27.9* 26.4* 28.7* 27.7*  MCV 84.3  --  83.3 83.3 80.8 83.2  PLT 139*  --  131* 145* 85* 89*   Basic Metabolic Panel: Recent Labs  Lab 01/25/19 0214 01/25/19 0517 01/25/19 0823 01/25/19 1526 01/26/19 0238 01/27/19 0248  NA 148* 148*  --  148* 148* 145  K 5.6* 5.6* 5.1 4.7 4.3 3.7  CL 120* 119*  --  116* 108 109  CO2 19* 20*  --  22 25 25   GLUCOSE 144* 150*  --  150* 113* 126*  BUN 80* 80*  --  80* 70* 67*  CREATININE 3.33* 3.32*  --  3.45* 3.22* 3.19*  CALCIUM 7.6* 7.6*  --  7.4* 7.0* 7.4*  MG 2.7*  --   --   --   --   --   PHOS 4.6 4.1  --   --   --  3.9   GFR: Estimated Creatinine Clearance: 23.3 mL/min (A) (by C-G formula based on SCr of 3.19 mg/dL (H)). Liver Function Tests: Recent Labs  Lab 01/24/19 1440 01/25/19 0517 01/27/19 0248  AST 53*  --   --   ALT 35  --   --   ALKPHOS 166*  --   --   BILITOT 3.5*  --   --   PROT 7.8  --   --   ALBUMIN 1.9* 1.6* 1.5*   No results for input(s): LIPASE, AMYLASE in the last 168 hours. Recent Labs  Lab 01/24/19 1440 01/26/19 0238  AMMONIA 63* 26   Coagulation Profile: No results for input(s): INR, PROTIME in the last 168 hours. Cardiac Enzymes: Recent Labs  Lab 01/25/19 0517  CKTOTAL 102   BNP (last 3 results) No results for input(s): PROBNP in the last 8760 hours. HbA1C: No results for input(s): HGBA1C in the last 72 hours. CBG: Recent Labs  Lab 01/24/19 1350 01/24/19 1740  GLUCAP 68* 107*   Lipid Profile: No results for input(s): CHOL, HDL, LDLCALC, TRIG, CHOLHDL, LDLDIRECT in the last 72 hours. Thyroid Function Tests: Recent Labs    01/25/19 0214  TSH 2.623   Anemia Panel: No results for input(s):  VITAMINB12, FOLATE, FERRITIN, TIBC, IRON, RETICCTPCT in the last 72 hours. Sepsis Labs: Recent Labs  Lab 01/24/19 1440 01/24/19 1605 01/25/19 0517 01/25/19 1906  LATICACIDVEN 3.7* 2.9* 2.5* 3.5*    Recent Results (from the past 240 hour(s))  Blood culture (routine x 2)     Status: Abnormal   Collection Time: 01/24/19  2:40 PM   Specimen: BLOOD  Result Value Ref Range Status   Specimen Description   Final    BLOOD RIGHT ANTECUBITAL Performed at [place] [address] [place], [place] [address]    Special Requests   Final  BOTTLES DRAWN AEROBIC AND ANAEROBIC Blood Culture results may not be optimal due to an inadequate volume of blood received in culture bottles Performed at [facility], [place] [address], [place], [place] [address]    Culture  Setup Time   Final    GRAM NEGATIVE RODS IN BOTH AEROBIC AND ANAEROBIC BOTTLES CRITICAL RESULT CALLED TO, READ BACK BY AND VERIFIED WITH: PHARMD ANH PHAM [phone] [id number] FCP Performed at [facility], [place] [address] [place], [place] [address]    Culture ESCHERICHIA COLI (A)  Final   Report Status 01/27/2019 FINAL  Final   Organism ID, Bacteria ESCHERICHIA COLI  Final      Susceptibility   Escherichia coli - MIC*    AMPICILLIN <=2 SENSITIVE Sensitive     CEFAZOLIN <=4 SENSITIVE Sensitive     CEFEPIME <=1 SENSITIVE Sensitive     CEFTAZIDIME <=1 SENSITIVE Sensitive     CEFTRIAXONE <=1 SENSITIVE Sensitive     CIPROFLOXACIN <=0.25 SENSITIVE Sensitive     GENTAMICIN <=1 SENSITIVE Sensitive     IMIPENEM <=0.25 SENSITIVE Sensitive     TRIMETH/SULFA <=20 SENSITIVE Sensitive     AMPICILLIN/SULBACTAM <=2 SENSITIVE Sensitive     PIP/TAZO <=4 SENSITIVE Sensitive     * ESCHERICHIA COLI  Blood culture (routine x 2)     Status: Abnormal   Collection Time: 01/24/19  2:40 PM   Specimen: BLOOD RIGHT ARM  Result Value Ref Range Status   Specimen Description   Final    BLOOD RIGHT ARM Performed at  [facility], [place] [address] [place], [place] [address]    Special Requests   Final    BOTTLES DRAWN AEROBIC AND ANAEROBIC Blood Culture results may not be optimal due to an inadequate volume of blood received in culture bottles Performed at [place] [address], [place], [place] [address]    Culture  Setup Time   Final    GRAM NEGATIVE RODS IN BOTH AEROBIC AND ANAEROBIC BOTTLES CRITICAL VALUE NOTED.  VALUE IS CONSISTENT WITH PREVIOUSLY REPORTED AND CALLED VALUE.    Culture (A)  Final    ESCHERICHIA COLI SUSCEPTIBILITIES PERFORMED ON PREVIOUS CULTURE WITHIN THE LAST 5 DAYS. Performed at [facility], [place] [address], [place], [place] [address]    Report Status 01/27/2019 FINAL  Final  Blood Culture ID Panel (Reflexed)     Status: Abnormal   Collection Time: 01/24/19  2:40 PM  Result Value Ref Range Status   Enterococcus species NOT DETECTED NOT DETECTED Final   Listeria monocytogenes NOT DETECTED NOT DETECTED Final   Staphylococcus species NOT DETECTED NOT DETECTED Final   Staphylococcus aureus (BCID) NOT DETECTED NOT DETECTED Final   Streptococcus species NOT DETECTED NOT DETECTED Final   Streptococcus agalactiae NOT DETECTED NOT DETECTED Final   Streptococcus pneumoniae NOT DETECTED NOT DETECTED Final   Streptococcus pyogenes NOT DETECTED NOT DETECTED Final   Acinetobacter baumannii NOT DETECTED NOT DETECTED Final   Enterobacteriaceae species DETECTED (A) NOT DETECTED Final    Comment: Enterobacteriaceae represent a large family of gram-negative bacteria, not a single organism. CRITICAL RESULT CALLED TO, READ BACK BY AND VERIFIED WITH: PHARMD ANH PHAM [phone] [id number] FCP    Enterobacter cloacae complex NOT DETECTED NOT DETECTED Final   Escherichia coli DETECTED (A) NOT DETECTED Final    Comment: CRITICAL RESULT CALLED TO, READ BACK BY AND VERIFIED WITH: PHARMD ANH PHAM [phone] FCP    Klebsiella oxytoca NOT DETECTED  NOT DETECTED Final    Klebsiella pneumoniae NOT DETECTED NOT DETECTED Final   Proteus species NOT DETECTED NOT DETECTED Final   Serratia marcescens NOT DETECTED NOT DETECTED Final   Carbapenem resistance NOT DETECTED NOT DETECTED Final   Haemophilus influenzae NOT DETECTED NOT DETECTED Final   Neisseria meningitidis NOT DETECTED NOT DETECTED Final   Pseudomonas aeruginosa NOT DETECTED NOT DETECTED Final   Candida albicans NOT DETECTED NOT DETECTED Final   Candida glabrata NOT DETECTED NOT DETECTED Final   Candida krusei NOT DETECTED NOT DETECTED Final   Candida parapsilosis NOT DETECTED NOT DETECTED Final   Candida tropicalis NOT DETECTED NOT DETECTED Final    Comment: Performed at [facility], [place] [address] [place], [place] [address]  Urine culture     Status: Abnormal   Collection Time: 01/24/19  4:48 PM   Specimen: Urine, Clean Catch  Result Value Ref Range Status   Specimen Description   Final    URINE, CLEAN CATCH Performed at [facility], [place] [address], [place], [place] [address]    Special Requests   Final    NONE Performed at [facility], [place] [address] [place], [place] [address]    Culture 30,000 COLONIES/mL ESCHERICHIA COLI (A)  Final   Report Status 01/26/2019 FINAL  Final   Organism ID, Bacteria ESCHERICHIA COLI (A)  Final      Susceptibility   Escherichia coli - MIC*    AMPICILLIN <=2 SENSITIVE Sensitive     CEFAZOLIN <=4 SENSITIVE Sensitive     CEFTRIAXONE <=1 SENSITIVE Sensitive     CIPROFLOXACIN <=0.25 SENSITIVE Sensitive     GENTAMICIN <=1 SENSITIVE Sensitive     IMIPENEM <=0.25 SENSITIVE Sensitive     NITROFURANTOIN <=16 SENSITIVE Sensitive     TRIMETH/SULFA <=20 SENSITIVE Sensitive     AMPICILLIN/SULBACTAM <=2 SENSITIVE Sensitive     PIP/TAZO <=4 SENSITIVE Sensitive     * 30,000 COLONIES/mL ESCHERICHIA COLI  SARS CORONAVIRUS 2 (TAT 6-24 HRS) Nasopharyngeal Nasopharyngeal Swab     Status: None   Collection Time: 01/24/19  5:42  PM   Specimen: Nasopharyngeal Swab  Result Value Ref Range Status   SARS Coronavirus 2 NEGATIVE NEGATIVE Final    Comment: (NOTE) SARS-CoV-2 target nucleic acids are NOT DETECTED. The SARS-CoV-2 RNA is generally detectable in upper and lower respiratory specimens during the acute phase of infection. Negative results do not preclude SARS-CoV-2 infection, do not rule out co-infections with other pathogens, and should not be used as the sole basis for treatment or other patient management decisions. Negative results must be combined with clinical observations, patient history, and epidemiological information. The expected result is Negative. Fact Sheet for Patients: SugarRoll.be Fact Sheet for Healthcare Providers: https://www.woods-mathews.com/ This test is not yet approved or cleared by the [place] FDA and  has been authorized for detection and/or diagnosis of SARS-CoV-2 by FDA under an Emergency Use Authorization (EUA). This EUA will remain  in effect (meaning this test can be used) for the duration of the COVID-19 declaration under Section 56 4(b)(1) of the Act, 21 U.S.C. section 360bbb-3(b)(1), unless the authorization is terminated or revoked sooner. Performed at [facility], [place] [address], [place], [place] [address]          Radiology Studies: DG Shoulder Left Port  Result Date: 01/27/2019 CLINICAL DATA:  Left anterior shoulder pain EXAM: LEFT SHOULDER COMPARISON:  None. FINDINGS: There is no fracture or dislocation. There are moderate degenerative changes  of the acromioclavicular joint. IMPRESSION: No acute osseous injury of the left shoulder. Electronically Signed   By: Kathreen Devoid   On: 01/27/2019 13:26        Scheduled Meds: . carvedilol  3.125 mg Oral BID WC  . Chlorhexidine Gluconate Cloth  6 each Topical Daily  . cloNIDine  0.3 mg Transdermal Weekly  . feeding supplement  1 Container Oral Q24H  .  feeding supplement (PRO-STAT SUGAR FREE 64)  30 mL Oral TID BM  . heparin  5,000 Units Subcutaneous Q8H  . lactulose  20 g Oral BID  . [START ON 01/28/2019] levothyroxine  50 mcg Oral QAC breakfast  . mouth rinse  15 mL Mouth Rinse BID  . pantoprazole (PROTONIX) IV  40 mg Intravenous Q24H  . penicillin g benzathine (BICILLIN-LA) IM  2.4 Million Units Intramuscular Weekly  . sertraline  25 mg Oral Daily  . sodium chloride flush  3 mL Intravenous Q12H   Continuous Infusions: . cefTRIAXone (ROCEPHIN)  IV Stopped (01/27/19 0940)  . dextrose 100 mL/hr at 01/27/19 1008     LOS: 3 days    Time spent: 65mins    Kathie Dike, MD [facility]   If 7PM-7AM, please contact night-coverage [email or web address]  01/27/2019, 7:57 PM

## 2019-01-28 DIAGNOSIS — R6520 Severe sepsis without septic shock: Secondary | ICD-10-CM

## 2019-01-28 DIAGNOSIS — K7469 Other cirrhosis of liver: Secondary | ICD-10-CM

## 2019-01-28 DIAGNOSIS — I10 Essential (primary) hypertension: Secondary | ICD-10-CM

## 2019-01-28 DIAGNOSIS — N3000 Acute cystitis without hematuria: Secondary | ICD-10-CM

## 2019-01-28 DIAGNOSIS — Z862 Personal history of diseases of the blood and blood-forming organs and certain disorders involving the immune mechanism: Secondary | ICD-10-CM

## 2019-01-28 DIAGNOSIS — A4151 Sepsis due to Escherichia coli [E. coli]: Secondary | ICD-10-CM

## 2019-01-28 DIAGNOSIS — N189 Chronic kidney disease, unspecified: Secondary | ICD-10-CM

## 2019-01-28 DIAGNOSIS — A530 Latent syphilis, unspecified as early or late: Secondary | ICD-10-CM

## 2019-01-28 DIAGNOSIS — D696 Thrombocytopenia, unspecified: Secondary | ICD-10-CM

## 2019-01-28 LAB — CBC WITH DIFFERENTIAL/PLATELET
Abs Immature Granulocytes: 0.19 10*3/uL — ABNORMAL HIGH (ref 0.00–0.07)
Basophils Absolute: 0.1 10*3/uL (ref 0.0–0.1)
Basophils Relative: 1 %
Eosinophils Absolute: 0.2 10*3/uL (ref 0.0–0.5)
Eosinophils Relative: 3 %
HCT: 32.7 % — ABNORMAL LOW (ref 39.0–52.0)
Hemoglobin: 10.8 g/dL — ABNORMAL LOW (ref 13.0–17.0)
Immature Granulocytes: 3 %
Lymphocytes Relative: 31 %
Lymphs Abs: 2.3 10*3/uL (ref 0.7–4.0)
MCH: 27.7 pg (ref 26.0–34.0)
MCHC: 33 g/dL (ref 30.0–36.0)
MCV: 83.8 fL (ref 80.0–100.0)
Monocytes Absolute: 1.1 10*3/uL — ABNORMAL HIGH (ref 0.1–1.0)
Monocytes Relative: 15 %
Neutro Abs: 3.6 10*3/uL (ref 1.7–7.7)
Neutrophils Relative %: 47 %
Platelets: 91 10*3/uL — ABNORMAL LOW (ref 150–400)
RBC: 3.9 MIL/uL — ABNORMAL LOW (ref 4.22–5.81)
RDW: 16.5 % — ABNORMAL HIGH (ref 11.5–15.5)
WBC: 7.4 10*3/uL (ref 4.0–10.5)
nRBC: 0.4 % — ABNORMAL HIGH (ref 0.0–0.2)

## 2019-01-28 LAB — RENAL FUNCTION PANEL
Albumin: 1.6 g/dL — ABNORMAL LOW (ref 3.5–5.0)
Anion gap: 11 (ref 5–15)
BUN: 55 mg/dL — ABNORMAL HIGH (ref 8–23)
CO2: 22 mmol/L (ref 22–32)
Calcium: 7.7 mg/dL — ABNORMAL LOW (ref 8.9–10.3)
Chloride: 107 mmol/L (ref 98–111)
Creatinine, Ser: 2.54 mg/dL — ABNORMAL HIGH (ref 0.61–1.24)
GFR calc Af Amer: 28 mL/min — ABNORMAL LOW (ref >60)
GFR calc non Af Amer: 24 mL/min — ABNORMAL LOW (ref >60)
Glucose, Bld: 91 mg/dL (ref 70–99)
Phosphorus: 3.4 mg/dL (ref 2.5–4.6)
Potassium: 4.1 mmol/L (ref 3.5–5.1)
Sodium: 140 mmol/L (ref 135–145)

## 2019-01-28 MED ORDER — CEFAZOLIN SODIUM-DEXTROSE 2-4 GM/100ML-% IV SOLN
2.0000 g | Freq: Two times a day (BID) | INTRAVENOUS | Status: AC
  Administered 2019-01-28 – 2019-01-30 (×6): 2 g via INTRAVENOUS
  Filled 2019-01-28 (×7): qty 100

## 2019-01-28 NOTE — Care Management Important Message (Signed)
Important Message  Patient Details IM Letter given to Velva Harman RN Case Manager to present to the Patient Name: Bruce Hayes MRN: [id number] Date of Birth: 1945/06/08   Medicare Important Message Given:  Yes     Kerin Salen 01/28/2019, 11:35 AM

## 2019-01-28 NOTE — NC FL2 (Signed)
[place] LEVEL OF CARE SCREENING TOOL     IDENTIFICATION  Patient Name: Bruce Hayes Birthdate: [birth year] Sex: male Admission Date (Current Location): 01/24/2019  [facility] and [place] Number:  Herbalist and Address:         Provider Number: [id number]  Attending Physician Name and Address:  Karie Kirks, DO  Relative Name and Phone Number:       Current Level of Care: Hospital Recommended Level of Care: [place] Prior Approval Number:    Date Approved/Denied:   PASRR Number: [id number]  Discharge Plan:      Current Diagnoses: Patient Active Problem List   Diagnosis Date Noted  . Sepsis ([place]) 01/27/2019  . E coli bacteremia 01/27/2019  . CKD (chronic kidney disease), stage IV ([place]) 01/27/2019  . Positive RPR test 01/27/2019  . History of anemia due to chronic kidney disease 01/27/2019  . Urinary tract infection 01/27/2019  . Encephalopathy 01/24/2019  . Acute on chronic renal failure ([place]) 12/29/2018  . Acute metabolic encephalopathy 69/67/8938  . Hepatic encephalopathy ([place]) 10/31/2018  . Encounter for colonoscopy due to history of adenomatous colonic polyps   . Benign neoplasm of ascending colon   . Other cirrhosis of liver ([place])   . Gastritis and gastroduodenitis   . Portal hypertensive gastropathy ([place])   . Hypertensive urgency 08/27/2016  . Acute kidney injury superimposed on CKD ([place]) 08/27/2016  . Headache 08/27/2016  . Blurred vision 08/27/2016  . AKI (acute kidney injury) ([place]) 08/27/2016  . Severe alcohol use disorder ([place]) 11/11/2015  . Alcohol use disorder, severe, dependence ([place]) 11/08/2015  . Alcohol abuse 12/07/2013  . Cocaine abuse ([place]) 12/07/2013  . Polysubstance abuse ([place]) 12/07/2013  . Alcohol use disorder, moderate, dependence ([place]) 12/07/2013  . Depression   . 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 CKD (chronic kidney disease), stage III 09/06/2007   . HEPATITIS B CARRIER 07/16/2007  . UNSPECIFIED DISORDER OF LIVER 07/03/2007  . HEPATITIS C 05/22/2007  . HYPERLIPIDEMIA 05/22/2007  . OBESITY 05/22/2007  . THROMBOCYTOPENIA 05/22/2007  . TOBACCO ABUSE 05/22/2007  . Essential hypertension 05/22/2007  . COCAINE ABUSE, HX OF 05/22/2007    Orientation RESPIRATION BLADDER Height & Weight     Self, Situation, Place  Normal Incontinent(has foley at present timje) Weight: 81.4 kg Height:  6' (182.9 cm)  BEHAVIORAL SYMPTOMS/MOOD NEUROLOGICAL BOWEL NUTRITION STATUS      Continent Diet(regular)  AMBULATORY STATUS COMMUNICATION OF NEEDS Skin   Extensive Assist Verbally Normal                       Personal Care Assistance Level of Assistance  Bathing, Feeding, Dressing Bathing Assistance: Limited assistance Feeding assistance: Limited assistance Dressing Assistance: Limited assistance     Functional Limitations Info             SPECIAL CARE FACTORS FREQUENCY  PT (By licensed PT)     PT Frequency: 5xweekly              Contractures      Additional Factors Info  Code Status Code Status Info: dnr             Current Medications (01/28/2019):  This is the current hospital active medication list Current Facility-Administered Medications  Medication Dose Route Frequency Provider Last Rate Last Admin  . acetaminophen (TYLENOL) tablet 650 mg  650 mg Oral Q6H PRN Margart Sickles, DO  Or  . acetaminophen (TYLENOL) suppository 650 mg  650 mg Rectal Q6H PRN Clinton Sawyer A, DO   650 mg at 01/25/19 1011  . bisacodyl (DULCOLAX) suppository 10 mg  10 mg Rectal Daily PRN Clinton Sawyer A, DO      . carvedilol (COREG) tablet 3.125 mg  3.125 mg Oral BID WC Kathie Dike, MD   3.125 mg at 01/28/19 0945  . ceFAZolin (ANCEF) IVPB 2g/100 mL premix  2 g Intravenous Q12H Swayze, Ava, DO 200 mL/hr at 01/28/19 0951 2 g at 01/28/19 0951  . Chlorhexidine Gluconate Cloth 2 % PADS 6 each  6 each Topical Daily Alma Friendly, MD   6 each at 01/27/19 1207  . cloNIDine (CATAPRES - Dosed in mg/24 hr) patch 0.3 mg  0.3 mg Transdermal Weekly Kathie Dike, MD   0.3 mg at 01/27/19 1206  . dextrose 5 % solution   Intravenous Continuous Elmarie Shiley, MD 100 mL/hr at 01/27/19 1008 IV Pump Association at 01/27/19 1008  . feeding supplement (BOOST / RESOURCE BREEZE) liquid 1 Container  1 Container Oral Q24H Kathie Dike, MD   1 Container at 01/27/19 1705  . feeding supplement (PRO-STAT SUGAR FREE 64) liquid 30 mL  30 mL Oral TID BM Kathie Dike, MD   30 mL at 01/28/19 0946  . heparin injection 5,000 Units  5,000 Units Subcutaneous Q8H Clinton Sawyer A, DO   5,000 Units at 01/28/19 5176  . hydrALAZINE (APRESOLINE) injection 10 mg  10 mg Intravenous Q8H PRN Alma Friendly, MD   10 mg at 01/26/19 1017  . HYDROmorphone (DILAUDID) injection 0.5 mg  0.5 mg Intravenous Q6H PRN Alma Friendly, MD   0.5 mg at 01/26/19 2314  . lactulose (CHRONULAC) 10 GM/15ML solution 20 g  20 g Oral BID Kathie Dike, MD   20 g at 01/28/19 0946  . levothyroxine (SYNTHROID) tablet 50 mcg  50 mcg Oral QAC breakfast Kathie Dike, MD   50 mcg at 01/28/19 0508  . MEDLINE mouth rinse  15 mL Mouth Rinse BID Alma Friendly, MD   15 mL at 01/27/19 2212  . ondansetron (ZOFRAN) tablet 4 mg  4 mg Oral Q6H PRN Clinton Sawyer A, DO       Or  . ondansetron (ZOFRAN) injection 4 mg  4 mg Intravenous Q6H PRN Clinton Sawyer A, DO      . pantoprazole (PROTONIX) injection 40 mg  40 mg Intravenous Q24H Alma Friendly, MD   40 mg at 01/27/19 1706  . penicillin g benzathine (BICILLIN LA) 1200000 UNIT/2ML injection 2.4 Million Units  2.4 Million Units Intramuscular Weekly Alma Friendly, MD   2.4 Million Units at 01/26/19 1712  . polyethylene glycol (MIRALAX / GLYCOLAX) packet 17 g  17 g Oral Daily PRN Clinton Sawyer A, DO      . sertraline (ZOLOFT) tablet 25 mg  25 mg Oral Daily Clinton Sawyer A, DO   25 mg at 01/28/19 0945  .  sodium chloride flush (NS) 0.9 % injection 3 mL  3 mL Intravenous Q12H Clinton Sawyer A, DO   3 mL at 01/28/19 1607     Discharge Medications: Please see discharge summary for a list of discharge medications.  Relevant Imaging Results:  Relevant Lab Results:   Additional Information [id number]  Leeroy Cha, RN

## 2019-01-28 NOTE — Progress Notes (Signed)
Patient ID: Bruce Hayes, male   DOB: 1945-11-27, 73 y.o.   MRN: [id number] [facility] Progress Note   Assessment/ Plan:   1. Acute kidney Injury on chronic kidney disease stage IV: Likely hemodynamically mediated with intravascular volume contraction from decreased oral intake in the setting of encephalopathy/possible UTI.  Peak creat 3.9, down to 2.5 today. Baseline creat 1.8- 2.2.  Patient mentating well. Would cont IVF's another 1-2 days and transition to po.  Will sign off. Will arrange for pt to be seen in f/u at [place].   2.  Hyperkalemia: Treated medically, resolved 3.  Hypernatremia: Secondary to frank dehydration/diminished fluid intake in the setting of encephalopathy.  4.  Metabolic encephalopathy: Multifactorial with acute kidney injury/azotemia, elevated ammonia level and UTI. I  On IV abx for E. coli UTI.  Mental status improving.  [name], MD 01/28/2019, 2:24 PM   Subjective:   No new c/o's.    Objective:   BP (!) 137/54 (BP Location: Right Arm)   Pulse (!) 59   Temp 97.8 F (36.6 C) (Oral)   Resp 15   Ht 6' (1.829 m)   Wt 81.4 kg   SpO2 100%   BMI 24.34 kg/m   Intake/Output Summary (Last 24 hours) at 01/28/2019 1424 Last data filed at 01/28/2019 1100 Gross per 24 hour  Intake 2141.7 ml  Output 675 ml  Net 1466.7 ml   Weight change:   Physical Exam: Gen: Awakens to calling out his name but without verbal response. CVS: Pulse regular rhythm, normal rate, S1 and S2 normal Resp: Anteriorly clear to auscultation, no rales/rhonchi Abd: Soft, flat, nontender Ext: No lower extremity edema   Medications:    . carvedilol  3.125 mg Oral BID WC  . Chlorhexidine Gluconate Cloth  6 each Topical Daily  . cloNIDine  0.3 mg Transdermal Weekly  . feeding supplement  1 Container Oral Q24H  . feeding supplement (PRO-STAT SUGAR FREE 64)  30 mL Oral TID BM  . heparin  5,000 Units Subcutaneous Q8H  . lactulose  20 g Oral BID  . levothyroxine  50 mcg  Oral QAC breakfast  . mouth rinse  15 mL Mouth Rinse BID  . pantoprazole (PROTONIX) IV  40 mg Intravenous Q24H  . penicillin g benzathine (BICILLIN-LA) IM  2.4 Million Units Intramuscular Weekly  . sertraline  25 mg Oral Daily  . sodium chloride flush  3 mL Intravenous Q12H
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
## 2019-01-28 NOTE — Progress Notes (Signed)
PROGRESS NOTE  Bruce Hayes FIE:[id number] DOB: May 15, 1945 DOA: 01/24/2019 PCP: Lorene Dy, MD  Brief History   73 year old male chronic kidney disease stage IV, cirrhosis, hypertension, resident of a skilled nursing facility was brought to the hospital with 3 days of progressive altered mental status to the point that he became nonverbal.  He was found to have acute kidney injury with associated hyperkalemia.  He was febrile, hypotensive and had elevated lactic acid.  Urinalysis indicated possible infection.  He was admitted for further management of sepsis secondary to urinary tract infection.  The patient's creatinine has improved from 3.9 on presentation to 2.5 today. His baseline is 2.2. Nephrology has signed off, but recommends another 2 days of IV fluid.   Plan is for the patient to return to [facility] at discharge.  Consultants  . Nephrology.  Procedures  . None  Antibiotics   Anti-infectives (From admission, onward)   Start     Dose/Rate Route Frequency Ordered Stop   01/28/19 1000  ceFAZolin (ANCEF) IVPB 2g/100 mL premix     2 g 200 mL/hr over 30 Minutes Intravenous Every 12 hours 01/28/19 0853 01/31/19 0959   01/26/19 1800  penicillin g benzathine (BICILLIN LA) 1200000 UNIT/2ML injection 2.4 Million Units     2.4 Million Units Intramuscular Weekly 01/26/19 1629 02/09/19 1759   01/25/19 1400  ceFEPIme (MAXIPIME) 2 g in sodium chloride 0.9 % 100 mL IVPB  Status:  Discontinued     2 g 200 mL/hr over 30 Minutes Intravenous Every 24 hours 01/24/19 2202 01/25/19 0858   01/25/19 1000  cefTRIAXone (ROCEPHIN) 2 g in sodium chloride 0.9 % 100 mL IVPB  Status:  Discontinued     2 g 200 mL/hr over 30 Minutes Intravenous Every 24 hours 01/25/19 0859 01/28/19 0853   01/24/19 2201  vancomycin variable dose per unstable renal function (pharmacist dosing)  Status:  Discontinued      Does not apply See admin instructions 01/24/19 2202 01/25/19 0856   01/24/19 1430  ceFEPIme  (MAXIPIME) 1 g in sodium chloride 0.9 % 100 mL IVPB     1 g 200 mL/hr over 30 Minutes Intravenous  Once 01/24/19 1414 01/24/19 1657   01/24/19 1415  vancomycin (VANCOCIN) IVPB 1000 mg/200 mL premix     1,000 mg 200 mL/hr over 60 Minutes Intravenous  Once 01/24/19 1414 01/24/19 1609    .  Subjective  The patient is resting comfortably. No new complaints.  Objective   Vitals:  Vitals:   01/27/19 1416 01/27/19 2052  BP: (!) 143/58 (!) 137/54  Pulse: 64 (!) 59  Resp: 16 15  Temp: 97.6 F (36.4 C) 97.8 F (36.6 C)  SpO2: 100% 100%   Exam:  Constitutional:  . The patient is awake, alert, and oriented x 3. No acute distress. Respiratory:  . No increased work of breathing. . No wheezes, rales, or rhonchi . No tactile fremitus Cardiovascular:  . Regular rate and rhythm . No murmurs, ectopy, or gallups. . No lateral PMI. No thrills. Abdomen:  . Abdomen is soft, non-tender, non-distended . No hernias, masses, or organomegaly . Normoactive bowel sounds.  Musculoskeletal:  . No cyanosis, clubbing, or edema Skin:  . No rashes, lesions, ulcers . palpation of skin: no induration or nodules Neurologic:  . CN 2-12 intact . Sensation all 4 extremities intact Psychiatric:  . Mental status o Mood, affect appropriate o Orientation to person, place, time  . judgment and insight appear intact I have personally reviewed  the following:   Today's Data  . Vitals, BMP  Micro Data  . Urine culture - E coli. Sensitive to all  Scheduled Meds: . carvedilol  3.125 mg Oral BID WC  . Chlorhexidine Gluconate Cloth  6 each Topical Daily  . cloNIDine  0.3 mg Transdermal Weekly  . feeding supplement  1 Container Oral Q24H  . feeding supplement (PRO-STAT SUGAR FREE 64)  30 mL Oral TID BM  . heparin  5,000 Units Subcutaneous Q8H  . lactulose  20 g Oral BID  . levothyroxine  50 mcg Oral QAC breakfast  . mouth rinse  15 mL Mouth Rinse BID  . pantoprazole (PROTONIX) IV  40 mg Intravenous  Q24H  . penicillin g benzathine (BICILLIN-LA) IM  2.4 Million Units Intramuscular Weekly  . sertraline  25 mg Oral Daily  . sodium chloride flush  3 mL Intravenous Q12H   Continuous Infusions: .  ceFAZolin (ANCEF) IV 2 g (01/28/19 0951)  . dextrose 100 mL/hr at 01/27/19 1008    Active Problems:   HEPATITIS C   THROMBOCYTOPENIA   Essential hypertension   AKI (acute kidney injury) ([place])   Other cirrhosis of liver (HCC)   Encephalopathy   Sepsis ([place])   E coli bacteremia   CKD (chronic kidney disease), stage IV (HCC)   Positive RPR test   History of anemia due to chronic kidney disease   Urinary tract infection   LOS: 4 days   A & P  Sepsis secondary to E. coli bacteremia from urinary tract infection.  Currently on Rocephin. Renal ultrasound does not show any evidence of hydronephrosis.  Continue current treatments.  Acute metabolic encephalopathy. Resolved. Secondary to sepsis, azotemia, hepatic encephalopathy.  Overall ammonia has improved and sepsis has also improved.    Acute kidney injury on chronic kidney disease stage IV.  Secondary to sepsis.  Baseline creatinine approximately 2.5.  Nephrology following and he was briefly placed on a bicarb drip due to metabolic acidosis.  Overall renal function is improving.  Anemia of chronic kidney disease.  Baseline hemoglobin 10-11.  No evidence of bleeding. Hemoglobin is 10.8 today.  Hyponatremia. Resolved. Secondary to poor oral intake.  Alcoholic/hepatitis C cirrhosis.  Ammonia level has improved.  Hypertension.  Blood pressure is under fair control on home dosages of Coreg and clonidine patch.  Positive RPR.  Reviewed with infectious disease who recommended 2 doses of penicillin, 1 week apart.  First dose was on 12/27.  Deconditioning.  Seen by physical therapy with recommendations for skilled nursing facility placement.  I have seen and examined this patient myself. I have spent 34 minutes in his evaluation and care.  Omari Koslosky, DO [facility] Direct contact: see [email or web address]  7PM-7AM contact night coverage as above 01/28/2019, 4:18 PM  LOS: 4 days

## 2019-01-28 NOTE — TOC Initial Note (Signed)
Transition of Care [facility]) - Initial/Assessment Note    Patient Details  Name: Bruce Hayes MRN: [id number] Date of Birth: Dec 31, 1945  Transition of [facility]) CM/SW Contact:    Leeroy Cha, RN Phone Number: 01/28/2019, 10:07 AM  Clinical Narrative:                 patioent from [name] sent to [place].  Expected Discharge Plan: Skilled Nursing Facility Barriers to Discharge: No Barriers Identified   Patient Goals and CMS Choice Patient states their goals for this hospitalization and ongoing recovery are:: to go back to snf CMS Medicare.gov Compare Post Acute Care list provided to:: Patient Choice offered to / list presented to : Patient  Expected Discharge Plan and Services Expected Discharge Plan: [place]   Discharge Planning Services: CM Consult Post Acute Care Choice: [place] Living arrangements for the past 2 months: [place]                                      Prior Living Arrangements/Services Living arrangements for the past 2 months: [place] Lives with:: Facility Resident Patient language and need for interpreter reviewed:: No Do you feel safe going back to the place where you live?: Yes      Need for Family Participation in Patient Care: No (Comment) Care giver support system in place?: No (comment)   Criminal Activity/Legal Involvement Pertinent to Current Situation/Hospitalization: No - Comment as needed  Activities of Daily Living Home Assistive Devices/Equipment: None ADL Screening (condition at time of admission) Patient's cognitive ability adequate to safely complete daily activities?: Yes Is the patient deaf or have difficulty hearing?: No Does the patient have difficulty seeing, even when wearing glasses/contacts?: No Does the patient have difficulty concentrating, remembering, or making decisions?: Yes Patient able to express need for assistance  with ADLs?: Yes Does the patient have difficulty dressing or bathing?: Yes Independently performs ADLs?: No Communication: Independent Dressing (OT): Needs assistance Is this a change from baseline?: Pre-admission baseline Grooming: Needs assistance Is this a change from baseline?: Pre-admission baseline Feeding: Independent Bathing: Needs assistance Is this a change from baseline?: Pre-admission baseline Toileting: Needs assistance Is this a change from baseline?: Pre-admission baseline In/Out Bed: Needs assistance Is this a change from baseline?: Pre-admission baseline Walks in Home: Needs assistance Is this a change from baseline?: Pre-admission baseline Does the patient have difficulty walking or climbing stairs?: Yes Weakness of Legs: Both Weakness of Arms/Hands: Right  Permission Sought/Granted Permission sought to share information with : Case Manager Permission granted to share information with : Yes, Verbal Permission Granted           Permission granted to share info w Contact Information: [facility]  Emotional Assessment Appearance:: Appears stated age Attitude/Demeanor/Rapport: Engaged Affect (typically observed): Calm Orientation: : Oriented to Place, Oriented to Self, Oriented to Situation Alcohol / Substance Use: Not Applicable Psych Involvement: No (comment)  Admission diagnosis:  Hyperkalemia [E87.5] Encephalopathy [G93.40] AKI (acute kidney injury) ([place]) [N17.9] Sepsis, due to unspecified organism, unspecified whether acute organ dysfunction present [facility]) [A41.9] Patient Active Problem List   Diagnosis Date Noted  . Sepsis ([place]) 01/27/2019  . E coli bacteremia 01/27/2019  . CKD (chronic kidney disease), stage IV ([place]) 01/27/2019  . Positive RPR test 01/27/2019  . History of anemia due to chronic kidney disease 01/27/2019  . Urinary  tract infection 01/27/2019  . Encephalopathy 01/24/2019  . Acute on chronic renal failure ([place]) 12/29/2018  .  Acute metabolic encephalopathy 28/36/6294  . Hepatic encephalopathy ([place]) 10/31/2018  . Encounter for colonoscopy due to history of adenomatous colonic polyps   . Benign neoplasm of ascending colon   . Other cirrhosis of liver ([place])   . Gastritis and gastroduodenitis   . Portal hypertensive gastropathy ([place])   . Hypertensive urgency 08/27/2016  . Acute kidney injury superimposed on CKD ([place]) 08/27/2016  . Headache 08/27/2016  . Blurred vision 08/27/2016  . AKI (acute kidney injury) ([place]) 08/27/2016  . Severe alcohol use disorder ([place]) 11/11/2015  . Alcohol use disorder, severe, dependence ([place]) 11/08/2015  . Alcohol abuse 12/07/2013  . Cocaine abuse ([place]) 12/07/2013  . Polysubstance abuse ([place]) 12/07/2013  . Alcohol use disorder, moderate, dependence ([place]) 12/07/2013  . Depression   . 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 CKD (chronic kidney disease), stage III 09/06/2007  . HEPATITIS B CARRIER 07/16/2007  . UNSPECIFIED DISORDER OF LIVER 07/03/2007  . HEPATITIS C 05/22/2007  . HYPERLIPIDEMIA 05/22/2007  . OBESITY 05/22/2007  . THROMBOCYTOPENIA 05/22/2007  . TOBACCO ABUSE 05/22/2007  . Essential hypertension 05/22/2007  . COCAINE ABUSE, HX OF 05/22/2007   PCP:  Lorene Dy, MD Pharmacy:   [facility] [place], [place] [address] Phone: [phone] Fax: [phone]     Social Determinants of Health (SDOH) Interventions    Readmission Risk Interventions Readmission Risk Prevention Plan 12/30/2018 11/02/2018  Transportation Screening Complete Complete  PCP or Specialist Appt within 3-5 Days - Not Complete  Not Complete comments - plan for SNF  HRI or [place] - Not Complete  HRI or Home Care Consult comments - plan for SNF  Social Work Consult for [place] Planning/Counseling - Complete  Palliative Care  Screening - Not Applicable  Medication Review (RN Care Manager) Complete Complete  PCP or Specialist appointment within 3-5 days of discharge Not Complete -  PCP/Specialist Appt Not Complete comments Patient will make own pcp appt;not ready for d/c yet. -  HRI or Home Care Consult Complete -  SW Recovery Care/Counseling Consult Complete -  Palliative Care Screening Not Applicable -  [place] Not Applicable -  Some recent data might be hidden

## 2019-01-28 NOTE — Plan of Care (Signed)

## 2019-01-29 DIAGNOSIS — D649 Anemia, unspecified: Secondary | ICD-10-CM

## 2019-01-29 LAB — RENAL FUNCTION PANEL
Albumin: 1.5 g/dL — ABNORMAL LOW (ref 3.5–5.0)
Anion gap: 8 (ref 5–15)
BUN: 52 mg/dL — ABNORMAL HIGH (ref 8–23)
CO2: 22 mmol/L (ref 22–32)
Calcium: 7.3 mg/dL — ABNORMAL LOW (ref 8.9–10.3)
Chloride: 105 mmol/L (ref 98–111)
Creatinine, Ser: 2.34 mg/dL — ABNORMAL HIGH (ref 0.61–1.24)
GFR calc Af Amer: 31 mL/min — ABNORMAL LOW (ref >60)
GFR calc non Af Amer: 27 mL/min — ABNORMAL LOW (ref >60)
Glucose, Bld: 110 mg/dL — ABNORMAL HIGH (ref 70–99)
Phosphorus: 3.3 mg/dL (ref 2.5–4.6)
Potassium: 3.4 mmol/L — ABNORMAL LOW (ref 3.5–5.1)
Sodium: 135 mmol/L (ref 135–145)

## 2019-01-29 LAB — HEPATIC FUNCTION PANEL
ALT: 21 U/L (ref 0–44)
AST: 52 U/L — ABNORMAL HIGH (ref 15–41)
Albumin: 1.4 g/dL — ABNORMAL LOW (ref 3.5–5.0)
Alkaline Phosphatase: 88 U/L (ref 38–126)
Bilirubin, Direct: 1.7 mg/dL — ABNORMAL HIGH (ref 0.0–0.2)
Indirect Bilirubin: 1.2 mg/dL — ABNORMAL HIGH (ref 0.3–0.9)
Total Bilirubin: 2.9 mg/dL — ABNORMAL HIGH (ref 0.3–1.2)
Total Protein: 5.6 g/dL — ABNORMAL LOW (ref 6.5–8.1)

## 2019-01-29 LAB — CBC WITH DIFFERENTIAL/PLATELET
Abs Immature Granulocytes: 0.31 10*3/uL — ABNORMAL HIGH (ref 0.00–0.07)
Basophils Absolute: 0 10*3/uL (ref 0.0–0.1)
Basophils Relative: 1 %
Eosinophils Absolute: 0.1 10*3/uL (ref 0.0–0.5)
Eosinophils Relative: 2 %
HCT: 24.9 % — ABNORMAL LOW (ref 39.0–52.0)
Hemoglobin: 8.5 g/dL — ABNORMAL LOW (ref 13.0–17.0)
Immature Granulocytes: 5 %
Lymphocytes Relative: 28 %
Lymphs Abs: 1.9 10*3/uL (ref 0.7–4.0)
MCH: 28.1 pg (ref 26.0–34.0)
MCHC: 34.1 g/dL (ref 30.0–36.0)
MCV: 82.2 fL (ref 80.0–100.0)
Monocytes Absolute: 0.9 10*3/uL (ref 0.1–1.0)
Monocytes Relative: 13 %
Neutro Abs: 3.4 10*3/uL (ref 1.7–7.7)
Neutrophils Relative %: 51 %
Platelets: 88 10*3/uL — ABNORMAL LOW (ref 150–400)
RBC: 3.03 MIL/uL — ABNORMAL LOW (ref 4.22–5.81)
RDW: 16.2 % — ABNORMAL HIGH (ref 11.5–15.5)
WBC: 6.6 10*3/uL (ref 4.0–10.5)
nRBC: 0.3 % — ABNORMAL HIGH (ref 0.0–0.2)

## 2019-01-29 LAB — LACTATE DEHYDROGENASE: LDH: 217 U/L — ABNORMAL HIGH (ref 98–192)

## 2019-01-29 MED ORDER — POTASSIUM CHLORIDE 10 MEQ/100ML IV SOLN
10.0000 meq | INTRAVENOUS | Status: AC
  Administered 2019-01-29 (×4): 10 meq via INTRAVENOUS
  Filled 2019-01-29: qty 100

## 2019-01-29 NOTE — Progress Notes (Addendum)
PROGRESS NOTE  Bruce Hayes DQQ:[id number] DOB: March 15, 1945 DOA: 01/24/2019 PCP: Lorene Dy, MD  Brief History   73 year old male chronic kidney disease stage IV, cirrhosis, hypertension, resident of a skilled nursing facility was brought to the hospital with 3 days of progressive altered mental status to the point that he became nonverbal.  He was found to have acute kidney injury with associated hyperkalemia.  He was febrile, hypotensive and had elevated lactic acid.  Urinalysis indicated possible infection.  He was admitted for further management of sepsis secondary to urinary tract infection.  The patient's creatinine has improved from 3.9 on presentation to 2.5 today. His baseline is 2.2. Nephrology has signed off, but recommends another 2 days of IV fluid.   Plan is for the patient to return to [facility] at discharge.  Consultants  . Nephrology.  Procedures  . None  Antibiotics   Anti-infectives (From admission, onward)   Start     Dose/Rate Route Frequency Ordered Stop   01/28/19 1000  ceFAZolin (ANCEF) IVPB 2g/100 mL premix     2 g 200 mL/hr over 30 Minutes Intravenous Every 12 hours 01/28/19 0853 01/31/19 0959   01/26/19 1800  penicillin g benzathine (BICILLIN LA) 1200000 UNIT/2ML injection 2.4 Million Units     2.4 Million Units Intramuscular Weekly 01/26/19 1629 02/09/19 1759   01/25/19 1400  ceFEPIme (MAXIPIME) 2 g in sodium chloride 0.9 % 100 mL IVPB  Status:  Discontinued     2 g 200 mL/hr over 30 Minutes Intravenous Every 24 hours 01/24/19 2202 01/25/19 0858   01/25/19 1000  cefTRIAXone (ROCEPHIN) 2 g in sodium chloride 0.9 % 100 mL IVPB  Status:  Discontinued     2 g 200 mL/hr over 30 Minutes Intravenous Every 24 hours 01/25/19 0859 01/28/19 0853   01/24/19 2201  vancomycin variable dose per unstable renal function (pharmacist dosing)  Status:  Discontinued      Does not apply See admin instructions 01/24/19 2202 01/25/19 0856   01/24/19 1430  ceFEPIme  (MAXIPIME) 1 g in sodium chloride 0.9 % 100 mL IVPB     1 g 200 mL/hr over 30 Minutes Intravenous  Once 01/24/19 1414 01/24/19 1657   01/24/19 1415  vancomycin (VANCOCIN) IVPB 1000 mg/200 mL premix     1,000 mg 200 mL/hr over 60 Minutes Intravenous  Once 01/24/19 1414 01/24/19 1609     Subjective  The patient is resting comfortably. No new complaints.  Objective   Vitals:  Vitals:   01/28/19 2048 01/29/19 0651  BP: (!) 121/53 (!) 150/58  Pulse: 60 (!) 57  Resp: 16 17  Temp: 97.8 F (36.6 C) 97.7 F (36.5 C)  SpO2: 98% 97%   Exam:  Constitutional:  . The patient is awake, alert, and oriented x 3. No acute distress. Respiratory:  . No increased work of breathing. . No wheezes, rales, or rhonchi . No tactile fremitus Cardiovascular:  . Regular rate and rhythm . No murmurs, ectopy, or gallups. . No lateral PMI. No thrills. Abdomen:  . Abdomen is soft, non-tender, non-distended . No hernias, masses, or organomegaly . Normoactive bowel sounds.  Musculoskeletal:  . No cyanosis, clubbing, or edema Skin:  . No rashes, lesions, ulcers . palpation of skin: no induration or nodules Neurologic:  . CN 2-12 intact . Sensation all 4 extremities intact Psychiatric:  . Mental status o Mood, affect appropriate o Orientation to person, place, time  . judgment and insight appear intact I have personally reviewed the  following:   Today's Data  . Vitals, BMP, CBC  Micro Data  . Urine culture - E coli. Sensitive to all  Scheduled Meds: . carvedilol  3.125 mg Oral BID WC  . Chlorhexidine Gluconate Cloth  6 each Topical Daily  . cloNIDine  0.3 mg Transdermal Weekly  . feeding supplement  1 Container Oral Q24H  . feeding supplement (PRO-STAT SUGAR FREE 64)  30 mL Oral TID BM  . heparin  5,000 Units Subcutaneous Q8H  . lactulose  20 g Oral BID  . levothyroxine  50 mcg Oral QAC breakfast  . mouth rinse  15 mL Mouth Rinse BID  . pantoprazole (PROTONIX) IV  40 mg Intravenous  Q24H  . penicillin g benzathine (BICILLIN-LA) IM  2.4 Million Units Intramuscular Weekly  . sertraline  25 mg Oral Daily  . sodium chloride flush  3 mL Intravenous Q12H   Continuous Infusions: .  ceFAZolin (ANCEF) IV 2 g (01/29/19 0923)  . dextrose 100 mL/hr at 01/29/19 0333  . potassium chloride      Active Problems:   HEPATITIS C   THROMBOCYTOPENIA   Essential hypertension   AKI (acute kidney injury) ([place])   Other cirrhosis of liver (HCC)   Encephalopathy   Sepsis ([place])   E coli bacteremia   CKD (chronic kidney disease), stage IV (HCC)   Positive RPR test   History of anemia due to chronic kidney disease   Urinary tract infection   LOS: 5 days   A & P  Sepsis secondary to E. coli bacteremia from urinary tract infection: the patient has completed 5 days of IV Antibiotics. He is afebrile. Surveillance cultures have had no growth. Will convert him to oral antibiotics.  Renal ultrasound does not show any evidence of hydronephrosis.   Acute metabolic encephalopathy. Resolved. Secondary to sepsis, azotemia, hepatic encephalopathy.  Overall ammonia has improved and sepsis has also improved.    Acute kidney injury on chronic kidney disease stage IV.  Secondary to sepsis.  Baseline creatinine approximately 2.5.  Nephrology following and he was briefly placed on a bicarb drip due to metabolic acidosis.  Overall renal function is improving.  Anemia of chronic kidney disease.  Baseline hemoglobin 10-11.  No evidence of bleeding. Hemoglobin was 10.8 on 01/28/2019. This morning 8.5. Am checking hemolysis labs and FOBT. Monitor hemoglobin. Unsafe to discharge until hemoglobin is stable or reason for drop is understood. No blood thinners.  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 Platelets have been as high as 225 on 12/29/2018, now down to 88. Monitor. No heparinoids.  Hyponatremia. Resolved. Secondary to poor oral intake.  Alcoholic/hepatitis C cirrhosis.  Ammonia level  has improved.  Hypertension.  Blood pressure is under fair control on home dosages of Coreg and clonidine patch.  Positive RPR.  Reviewed with infectious disease who recommended 2 doses of penicillin, 1 week apart.  First dose was on 12/27.  Deconditioning.  Seen by physical therapy with recommendations for skilled nursing facility placement.  I have seen and examined this patient myself. I have spent 35 minutes in his evaluation and care.  DVT Prophylaxis: SCD's CODE STATUS: DNR Family Communication: None available Disposition: PT has recommended SNF. TOC is working with family in terms of choosing a site.  Jezreel Sisk, DO [facility] Direct contact: see [email or web address]  7PM-7AM contact night coverage as above 01/29/2019, 11:57 AM  LOS: 4 days
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
## 2019-01-30 LAB — COMPREHENSIVE METABOLIC PANEL
ALT: 14 U/L (ref 0–44)
AST: 54 U/L — ABNORMAL HIGH (ref 15–41)
Albumin: 1.4 g/dL — ABNORMAL LOW (ref 3.5–5.0)
Alkaline Phosphatase: 86 U/L (ref 38–126)
Anion gap: 9 (ref 5–15)
BUN: 42 mg/dL — ABNORMAL HIGH (ref 8–23)
CO2: 20 mmol/L — ABNORMAL LOW (ref 22–32)
Calcium: 7.1 mg/dL — ABNORMAL LOW (ref 8.9–10.3)
Chloride: 102 mmol/L (ref 98–111)
Creatinine, Ser: 2.02 mg/dL — ABNORMAL HIGH (ref 0.61–1.24)
GFR calc Af Amer: 37 mL/min — ABNORMAL LOW (ref >60)
GFR calc non Af Amer: 32 mL/min — ABNORMAL LOW (ref >60)
Glucose, Bld: 98 mg/dL (ref 70–99)
Potassium: 3.8 mmol/L (ref 3.5–5.1)
Sodium: 131 mmol/L — ABNORMAL LOW (ref 135–145)
Total Bilirubin: 3 mg/dL — ABNORMAL HIGH (ref 0.3–1.2)
Total Protein: 5.3 g/dL — ABNORMAL LOW (ref 6.5–8.1)

## 2019-01-30 LAB — RENAL FUNCTION PANEL
Albumin: 1.4 g/dL — ABNORMAL LOW (ref 3.5–5.0)
Anion gap: 9 (ref 5–15)
BUN: 41 mg/dL — ABNORMAL HIGH (ref 8–23)
CO2: 20 mmol/L — ABNORMAL LOW (ref 22–32)
Calcium: 7.1 mg/dL — ABNORMAL LOW (ref 8.9–10.3)
Chloride: 102 mmol/L (ref 98–111)
Creatinine, Ser: 1.98 mg/dL — ABNORMAL HIGH (ref 0.61–1.24)
GFR calc Af Amer: 38 mL/min — ABNORMAL LOW (ref >60)
GFR calc non Af Amer: 33 mL/min — ABNORMAL LOW (ref >60)
Glucose, Bld: 98 mg/dL (ref 70–99)
Phosphorus: 2.3 mg/dL — ABNORMAL LOW (ref 2.5–4.6)
Potassium: 3.8 mmol/L (ref 3.5–5.1)
Sodium: 131 mmol/L — ABNORMAL LOW (ref 135–145)

## 2019-01-30 LAB — CBC WITH DIFFERENTIAL/PLATELET
Abs Immature Granulocytes: 0.37 10*3/uL — ABNORMAL HIGH (ref 0.00–0.07)
Basophils Absolute: 0 10*3/uL (ref 0.0–0.1)
Basophils Relative: 1 %
Eosinophils Absolute: 0.1 10*3/uL (ref 0.0–0.5)
Eosinophils Relative: 2 %
HCT: 24 % — ABNORMAL LOW (ref 39.0–52.0)
Hemoglobin: 8.1 g/dL — ABNORMAL LOW (ref 13.0–17.0)
Immature Granulocytes: 6 %
Lymphocytes Relative: 27 %
Lymphs Abs: 1.8 10*3/uL (ref 0.7–4.0)
MCH: 28 pg (ref 26.0–34.0)
MCHC: 33.8 g/dL (ref 30.0–36.0)
MCV: 83 fL (ref 80.0–100.0)
Monocytes Absolute: 0.8 10*3/uL (ref 0.1–1.0)
Monocytes Relative: 12 %
Neutro Abs: 3.6 10*3/uL (ref 1.7–7.7)
Neutrophils Relative %: 52 %
Platelets: 82 10*3/uL — ABNORMAL LOW (ref 150–400)
RBC: 2.89 MIL/uL — ABNORMAL LOW (ref 4.22–5.81)
RDW: 16.4 % — ABNORMAL HIGH (ref 11.5–15.5)
WBC: 6.7 10*3/uL (ref 4.0–10.5)
nRBC: 0.3 % — ABNORMAL HIGH (ref 0.0–0.2)

## 2019-01-30 LAB — HAPTOGLOBIN: Haptoglobin: <10 mg/dL — ABNORMAL LOW (ref 34–355)

## 2019-01-30 LAB — SARS CORONAVIRUS 2 (TAT 6-24 HRS): SARS Coronavirus 2: NEGATIVE

## 2019-01-30 NOTE — Progress Notes (Signed)
PROGRESS NOTE    Bruce Hayes  LJQ:[id number] DOB: [birth year] DOA: 01/24/2019 PCP: Lorene Dy, MD    Brief Narrative:  73 year old male chronic kidney disease stage IV, cirrhosis, hypertension, resident of a skilled nursing facility was brought to the hospital with 3 days of progressive altered mental status to the point that he became nonverbal. He was found to have acute kidney injury with associated hyperkalemia. He was febrile, hypotensive and had elevated lactic acid. Urinalysis indicated possible infection. He was admitted for further management of sepsis secondary to urinary tract infection.  The patient's creatinine has improved from 3.9 on presentation to 2.5 today. His baseline is 2.2. Nephrology has signed off, but recommends another 2 days of IV fluid.   Plan is for the patient to return to [facility] at discharge  Assessment & Plan:   Active Problems:   HEPATITIS C   THROMBOCYTOPENIA   Essential hypertension   AKI (acute kidney injury) ([place])   Other cirrhosis of liver (HCC)   Encephalopathy   Sepsis ([place])   E coli bacteremia   CKD (chronic kidney disease), stage IV (HCC)   Positive RPR test   History of anemia due to chronic kidney disease   Urinary tract infection  Sepsis secondary to E. coli bacteremia from urinary tract infection, present on admit: the patient has completed 5 days of IV Antibiotics. Pt remains afebrile. Surveillance cultures have had no growth. Now on oral antibiotics.  Renal ultrasound does without evidence of hydronephrosis. Sepsis physiology resolved and pt currently stable  Acute metabolic encephalopathy. Resolved.Secondary to sepsis, azotemia, hepatic encephalopathy. Ammonia levels improved, sepsis physiology resolved. Mentation seems to be baseline  Acute kidney injury on chronic kidney disease stage IV. Secondary to sepsis. Baseline creatinine approximately 2.5. Nephrology following and he was briefly placed on a bicarb  drip due to metabolic acidosis. Overall renal function is improving and nephrology had signed off  Anemia of chronic kidney disease. Baseline hemoglobin 10-11. No evidence of bleeding. Hemoglobin was 10.8 on 01/28/2019. This morning 8.5. No obvious sign of blood loss. Hgb had been trending down. Hemodynamically stable. Repeat cbc in AM  Thrombocytopenia: Recurrent and likely due to cirrhosis. Platelets have been as high as 225 on 12/29/2018, now down to 82. Repeat cbc in AM  Hyponatremia. Resolved. Secondary to poor oral intake.  Alcoholic/hepatitis C cirrhosis. Ammonia level has improved. Mentation stable  Hypertension. Continue on home Coreg and clonidine patch. BP stable at this time  Positive RPR. Dr. Benny Lennert reviewed with infectious disease who recommended 2 doses of penicillin, 1 week apart. First dose was on 12/27.  Deconditioning. Seen by physical therapy with recommendations for skilled nursing facility placement. Pt initially wanted to go home instead, however later agrees with SNF placement   DVT prophylaxis: SCD's. Will stop subq heparin secondary to thrombocyotpenia Code Status: DNR Family Communication: Pt in room, family not at bedside Disposition Plan: Possible SNF in 24hrs  Consultants:   nephrology  Procedures:     Antimicrobials: Anti-infectives (From admission, onward)   Start     Dose/Rate Route Frequency Ordered Stop   01/28/19 1000  ceFAZolin (ANCEF) IVPB 2g/100 mL premix     2 g 200 mL/hr over 30 Minutes Intravenous Every 12 hours 01/28/19 0853 01/31/19 0959   01/26/19 1800  penicillin g benzathine (BICILLIN LA) 1200000 UNIT/2ML injection 2.4 Million Units     2.4 Million Units Intramuscular Weekly 01/26/19 1629 02/09/19 1759   01/25/19 1400  ceFEPIme (MAXIPIME) 2 g in sodium  chloride 0.9 % 100 mL IVPB  Status:  Discontinued     2 g 200 mL/hr over 30 Minutes Intravenous Every 24 hours 01/24/19 2202 01/25/19 0858   01/25/19 1000   cefTRIAXone (ROCEPHIN) 2 g in sodium chloride 0.9 % 100 mL IVPB  Status:  Discontinued     2 g 200 mL/hr over 30 Minutes Intravenous Every 24 hours 01/25/19 0859 01/28/19 0853   01/24/19 2201  vancomycin variable dose per unstable renal function (pharmacist dosing)  Status:  Discontinued      Does not apply See admin instructions 01/24/19 2202 01/25/19 0856   01/24/19 1430  ceFEPIme (MAXIPIME) 1 g in sodium chloride 0.9 % 100 mL IVPB     1 g 200 mL/hr over 30 Minutes Intravenous  Once 01/24/19 1414 01/24/19 1657   01/24/19 1415  vancomycin (VANCOCIN) IVPB 1000 mg/200 mL premix     1,000 mg 200 mL/hr over 60 Minutes Intravenous  Once 01/24/19 1414 01/24/19 1609       Subjective: Feels better today  Objective: Vitals:   01/29/19 1244 01/29/19 2050 01/30/19 0611 01/30/19 1258  BP: 132/60 (!) 123/50 (!) 132/56 (!) 149/63  Pulse: 62 (!) 57 (!) 57 67  Resp: 18 18 16 16   Temp: 97.8 F (36.6 C) 97.6 F (36.4 C) 98.3 F (36.8 C) 98.4 F (36.9 C)  TempSrc: Oral Oral Oral Oral  SpO2: 100% 99% 99% 100%  Weight:      Height:        Intake/Output Summary (Last 24 hours) at 01/30/2019 1741 Last data filed at 01/30/2019 1716 Gross per 24 hour  Intake 3819.89 ml  Output --  Net 3819.89 ml   Filed Weights   01/25/19 1530 01/28/19 2051  Weight: 81.4 kg 88.3 kg    Examination:  General exam: Appears calm and comfortable  Respiratory system: Clear to auscultation. Respiratory effort normal. Cardiovascular system: S1 & S2 heard, Regular Gastrointestinal system: Abdomen is nondistended, soft and nontender. No organomegaly or masses felt. Normal bowel sounds heard. Central nervous system: Alert and oriented. No focal neurological deficits. Extremities: Symmetric 5 x 5 power. Skin: No rashes, lesions Psychiatry: Judgement and insight appear normal. Mood & affect appropriate.   Data Reviewed: I have personally reviewed following labs and imaging studies  CBC: Recent Labs  Lab  01/26/19 0238 01/27/19 0248 01/28/19 0459 01/29/19 0538 01/30/19 0525  WBC 11.3* 8.7 7.4 6.6 6.7  NEUTROABS 7.5 5.0 3.6 3.4 3.6  HGB 10.0* 9.3* 10.8* 8.5* 8.1*  HCT 28.7* 27.7* 32.7* 24.9* 24.0*  MCV 80.8 83.2 83.8 82.2 83.0  PLT 85* 89* 91* 88* 82*   Basic Metabolic Panel: Recent Labs  Lab 01/25/19 0214 01/25/19 0517 01/25/19 0823 01/26/19 0238 01/27/19 0248 01/28/19 0459 01/29/19 0538 01/30/19 0525  NA 148* 148*   < > 148* 145 140 135 131*  131*  K 5.6* 5.6*  --  4.3 3.7 4.1 3.4* 3.8  3.8  CL 120* 119*   < > 108 109 107 105 102  102  CO2 19* 20*   < > 25 25 22 22  20*  20*  GLUCOSE 144* 150*   < > 113* 126* 91 110* 98  98  BUN 80* 80*   < > 70* 67* 55* 52* 41*  42*  CREATININE 3.33* 3.32*   < > 3.22* 3.19* 2.54* 2.34* 1.98*  2.02*  CALCIUM 7.6* 7.6*   < > 7.0* 7.4* 7.7* 7.3* 7.1*  7.1*  MG 2.7*  --   --   --   --   --   --   --  PHOS 4.6 4.1  --   --  3.9 3.4 3.3 2.3*   < > = values in this interval not displayed.   GFR: Estimated Creatinine Clearance: 36.4 mL/min (A) (by C-G formula based on SCr of 1.98 mg/dL (H)). Liver Function Tests: Recent Labs  Lab 01/24/19 1440 01/25/19 0517 01/27/19 0248 01/28/19 0459 01/29/19 0538 01/30/19 0525  AST 53*  --   --   --  52* 54*  ALT 35  --   --   --  21 14  ALKPHOS 166*  --   --   --  88 86  BILITOT 3.5*  --   --   --  2.9* 3.0*  PROT 7.8  --   --   --  5.6* 5.3*  ALBUMIN 1.9* 1.6* 1.5* 1.6* 1.4*  1.5* 1.4*  1.4*   No results for input(s): LIPASE, AMYLASE in the last 168 hours. Recent Labs  Lab 01/24/19 1440 01/26/19 0238  AMMONIA 63* 26   Coagulation Profile: No results for input(s): INR, PROTIME in the last 168 hours. Cardiac Enzymes: Recent Labs  Lab 01/25/19 0517  CKTOTAL 102   BNP (last 3 results) No results for input(s): PROBNP in the last 8760 hours. HbA1C: No results for input(s): HGBA1C in the last 72 hours. CBG: Recent Labs  Lab 01/24/19 1350 01/24/19 1740  GLUCAP 68* 107*    Lipid Profile: No results for input(s): CHOL, HDL, LDLCALC, TRIG, CHOLHDL, LDLDIRECT in the last 72 hours. Thyroid Function Tests: No results for input(s): TSH, T4TOTAL, FREET4, T3FREE, THYROIDAB in the last 72 hours. Anemia Panel: No results for input(s): VITAMINB12, FOLATE, FERRITIN, TIBC, IRON, RETICCTPCT in the last 72 hours. Sepsis Labs: Recent Labs  Lab 01/24/19 1440 01/24/19 1605 01/25/19 0517 01/25/19 1906  LATICACIDVEN 3.7* 2.9* 2.5* 3.5*    Recent Results (from the past 240 hour(s))  Blood culture (routine x 2)     Status: Abnormal   Collection Time: 01/24/19  2:40 PM   Specimen: BLOOD  Result Value Ref Range Status   Specimen Description   Final    BLOOD RIGHT ANTECUBITAL Performed at [facility] [address], [place], [place] [address]    Special Requests   Final    BOTTLES DRAWN AEROBIC AND ANAEROBIC Blood Culture results may not be optimal due to an inadequate volume of blood received in culture bottles Performed at [facility] [address], [place], [place] [address]    Culture  Setup Time   Final    GRAM NEGATIVE RODS IN BOTH AEROBIC AND ANAEROBIC BOTTLES CRITICAL RESULT CALLED TO, READ BACK BY AND VERIFIED WITH: PHARMD ANH PHAM [phone] [id number] FCP Performed at [facility], [place] [address], [place], [place] [address]    Culture ESCHERICHIA COLI (A)  Final   Report Status 01/27/2019 FINAL  Final   Organism ID, Bacteria ESCHERICHIA COLI  Final      Susceptibility   Escherichia coli - MIC*    AMPICILLIN <=2 SENSITIVE Sensitive     CEFAZOLIN <=4 SENSITIVE Sensitive     CEFEPIME <=1 SENSITIVE Sensitive     CEFTAZIDIME <=1 SENSITIVE Sensitive     CEFTRIAXONE <=1 SENSITIVE Sensitive     CIPROFLOXACIN <=0.25 SENSITIVE Sensitive     GENTAMICIN <=1 SENSITIVE Sensitive     IMIPENEM <=0.25 SENSITIVE Sensitive     TRIMETH/SULFA <=20 SENSITIVE Sensitive     AMPICILLIN/SULBACTAM <=2 SENSITIVE Sensitive     PIP/TAZO  <=4 SENSITIVE Sensitive     *  ESCHERICHIA COLI  Blood culture (routine x 2)     Status: Abnormal   Collection Time: 01/24/19  2:40 PM   Specimen: BLOOD RIGHT ARM  Result Value Ref Range Status   Specimen Description   Final    BLOOD RIGHT ARM Performed at [facility], [address] [place], [place] [address]    Special Requests   Final    BOTTLES DRAWN AEROBIC AND ANAEROBIC Blood Culture results may not be optimal due to an inadequate volume of blood received in culture bottles Performed at [facility] [address] [place], [place] [address]    Culture  Setup Time   Final    GRAM NEGATIVE RODS IN BOTH AEROBIC AND ANAEROBIC BOTTLES CRITICAL VALUE NOTED.  VALUE IS CONSISTENT WITH PREVIOUSLY REPORTED AND CALLED VALUE.    Culture (A)  Final    ESCHERICHIA COLI SUSCEPTIBILITIES PERFORMED ON PREVIOUS CULTURE WITHIN THE LAST 5 DAYS. Performed at [facility], [place] [address], [place], [place] [address]    Report Status 01/27/2019 FINAL  Final  Blood Culture ID Panel (Reflexed)     Status: Abnormal   Collection Time: 01/24/19  2:40 PM  Result Value Ref Range Status   Enterococcus species NOT DETECTED NOT DETECTED Final   Listeria monocytogenes NOT DETECTED NOT DETECTED Final   Staphylococcus species NOT DETECTED NOT DETECTED Final   Staphylococcus aureus (BCID) NOT DETECTED NOT DETECTED Final   Streptococcus species NOT DETECTED NOT DETECTED Final   Streptococcus agalactiae NOT DETECTED NOT DETECTED Final   Streptococcus pneumoniae NOT DETECTED NOT DETECTED Final   Streptococcus pyogenes NOT DETECTED NOT DETECTED Final   Acinetobacter baumannii NOT DETECTED NOT DETECTED Final   Enterobacteriaceae species DETECTED (A) NOT DETECTED Final    Comment: Enterobacteriaceae represent a large family of gram-negative bacteria, not a single organism. CRITICAL RESULT CALLED TO, READ BACK BY AND VERIFIED WITH: PHARMD ANH PHAM [phone] [id number] FCP    Enterobacter  cloacae complex NOT DETECTED NOT DETECTED Final   Escherichia coli DETECTED (A) NOT DETECTED Final    Comment: CRITICAL RESULT CALLED TO, READ BACK BY AND VERIFIED WITH: PHARMD ANH PHAM [id number] FCP    Klebsiella oxytoca NOT DETECTED NOT DETECTED Final   Klebsiella pneumoniae NOT DETECTED NOT DETECTED Final   Proteus species NOT DETECTED NOT DETECTED Final   Serratia marcescens NOT DETECTED NOT DETECTED Final   Carbapenem resistance NOT DETECTED NOT DETECTED Final   Haemophilus influenzae NOT DETECTED NOT DETECTED Final   Neisseria meningitidis NOT DETECTED NOT DETECTED Final   Pseudomonas aeruginosa NOT DETECTED NOT DETECTED Final   Candida albicans NOT DETECTED NOT DETECTED Final   Candida glabrata NOT DETECTED NOT DETECTED Final   Candida krusei NOT DETECTED NOT DETECTED Final   Candida parapsilosis NOT DETECTED NOT DETECTED Final   Candida tropicalis NOT DETECTED NOT DETECTED Final    Comment: Performed at [facility], [place]. [address], [place], [place] [address]  Urine culture     Status: Abnormal   Collection Time: 01/24/19  4:48 PM   Specimen: Urine, Clean Catch  Result Value Ref Range Status   Specimen Description   Final    URINE, CLEAN CATCH Performed at [facility], [place] [address] [place], [place] [address]    Special Requests   Final    NONE Performed at [facility], [place] [address] [place], [place] [address]    Culture 30,000 COLONIES/mL ESCHERICHIA COLI (A)  Final   Report Status 01/26/2019 FINAL  Final   Organism ID, Bacteria ESCHERICHIA COLI (A)  Final      Susceptibility   Escherichia coli - MIC*    AMPICILLIN <=2 SENSITIVE Sensitive     CEFAZOLIN <=4 SENSITIVE Sensitive     CEFTRIAXONE <=1 SENSITIVE Sensitive     CIPROFLOXACIN <=0.25 SENSITIVE Sensitive     GENTAMICIN <=1 SENSITIVE Sensitive     IMIPENEM <=0.25 SENSITIVE Sensitive     NITROFURANTOIN <=16 SENSITIVE Sensitive     TRIMETH/SULFA <=20 SENSITIVE  Sensitive     AMPICILLIN/SULBACTAM <=2 SENSITIVE Sensitive     PIP/TAZO <=4 SENSITIVE Sensitive     * 30,000 COLONIES/mL ESCHERICHIA COLI  SARS CORONAVIRUS 2 (TAT 6-24 HRS) Nasopharyngeal Nasopharyngeal Swab     Status: None   Collection Time: 01/24/19  5:42 PM   Specimen: Nasopharyngeal Swab  Result Value Ref Range Status   SARS Coronavirus 2 NEGATIVE NEGATIVE Final    Comment: (NOTE) SARS-CoV-2 target nucleic acids are NOT DETECTED. The SARS-CoV-2 RNA is generally detectable in upper and lower respiratory specimens during the acute phase of infection. Negative results do not preclude SARS-CoV-2 infection, do not rule out co-infections with other pathogens, and should not be used as the sole basis for treatment or other patient management decisions. Negative results must be combined with clinical observations, patient history, and epidemiological information. The expected result is Negative. Fact Sheet for Patients: SugarRoll.be Fact Sheet for Healthcare Providers: https://www.woods-mathews.com/ This test is not yet approved or cleared by the [place] FDA and  has been authorized for detection and/or diagnosis of SARS-CoV-2 by FDA under an Emergency Use Authorization (EUA). This EUA will remain  in effect (meaning this test can be used) for the duration of the COVID-19 declaration under Section 56 4(b)(1) of the Act, 21 U.S.C. section 360bbb-3(b)(1), unless the authorization is terminated or revoked sooner. Performed at [facility], [place] [address], [place], [place] [address]   Culture, blood (routine x 2)     Status: None (Preliminary result)   Collection Time: 01/28/19 12:11 PM   Specimen: BLOOD RIGHT HAND  Result Value Ref Range Status   Specimen Description   Final    BLOOD RIGHT HAND Performed at [facility] [address], [place], [place] [address]    Special Requests   Final    BOTTLES DRAWN  AEROBIC AND ANAEROBIC Blood Culture adequate volume Performed at [place] [address] [place], [place] [address]    Culture   Final    NO GROWTH 2 DAYS Performed at [facility] [address], [place], [place] [address]    Report Status PENDING  Incomplete  Culture, blood (routine x 2)     Status: None (Preliminary result)   Collection Time: 01/28/19 12:24 PM   Specimen: BLOOD LEFT HAND  Result Value Ref Range Status   Specimen Description   Final    BLOOD LEFT HAND Performed at [facility] [address], [place], [place] [address]    Special Requests   Final    BOTTLES DRAWN AEROBIC ONLY Blood Culture results may not be optimal due to an inadequate volume of blood received in culture bottles Performed at [facility] [address], [place], [place] [address]    Culture   Final    NO GROWTH 2 DAYS Performed at [facility] [address] [place], [place] [address]  Report Status PENDING  Incomplete     Radiology Studies: No results found.  Scheduled Meds: . carvedilol  3.125 mg Oral BID WC  . Chlorhexidine Gluconate Cloth  6 each Topical Daily  . cloNIDine  0.3 mg Transdermal Weekly  . feeding supplement  1 Container Oral Q24H  . feeding supplement (PRO-STAT SUGAR FREE 64)  30 mL Oral TID BM  . heparin  5,000 Units Subcutaneous Q8H  . lactulose  20 g Oral BID  . levothyroxine  50 mcg Oral QAC breakfast  . mouth rinse  15 mL Mouth Rinse BID  . pantoprazole (PROTONIX) IV  40 mg Intravenous Q24H  . penicillin g benzathine (BICILLIN-LA) IM  2.4 Million Units Intramuscular Weekly  . sertraline  25 mg Oral Daily  . sodium chloride flush  3 mL Intravenous Q12H   Continuous Infusions: .  ceFAZolin (ANCEF) IV 2 g (01/30/19 1109)  . dextrose 100 mL/hr at 01/30/19 1259     LOS: 6 days   Marylu Lund, MD [facility] Pager On Amion  If 7PM-7AM, please contact  night-coverage 01/30/2019, 5:41 PM

## 2019-01-31 LAB — RENAL FUNCTION PANEL
Albumin: 1.4 g/dL — ABNORMAL LOW (ref 3.5–5.0)
Anion gap: 9 (ref 5–15)
BUN: 37 mg/dL — ABNORMAL HIGH (ref 8–23)
CO2: 21 mmol/L — ABNORMAL LOW (ref 22–32)
Calcium: 7.4 mg/dL — ABNORMAL LOW (ref 8.9–10.3)
Chloride: 109 mmol/L (ref 98–111)
Creatinine, Ser: 1.82 mg/dL — ABNORMAL HIGH (ref 0.61–1.24)
GFR calc Af Amer: 42 mL/min — ABNORMAL LOW (ref >60)
GFR calc non Af Amer: 37 mL/min — ABNORMAL LOW (ref >60)
Glucose, Bld: 112 mg/dL — ABNORMAL HIGH (ref 70–99)
Phosphorus: 2.5 mg/dL (ref 2.5–4.6)
Potassium: 4.1 mmol/L (ref 3.5–5.1)
Sodium: 139 mmol/L (ref 135–145)

## 2019-01-31 LAB — CBC WITH DIFFERENTIAL/PLATELET
Abs Immature Granulocytes: 0.27 10*3/uL — ABNORMAL HIGH (ref 0.00–0.07)
Basophils Absolute: 0 10*3/uL (ref 0.0–0.1)
Basophils Relative: 1 %
Eosinophils Absolute: 0.1 10*3/uL (ref 0.0–0.5)
Eosinophils Relative: 1 %
HCT: 25.4 % — ABNORMAL LOW (ref 39.0–52.0)
Hemoglobin: 8.5 g/dL — ABNORMAL LOW (ref 13.0–17.0)
Immature Granulocytes: 4 %
Lymphocytes Relative: 31 %
Lymphs Abs: 1.9 10*3/uL (ref 0.7–4.0)
MCH: 27.7 pg (ref 26.0–34.0)
MCHC: 33.5 g/dL (ref 30.0–36.0)
MCV: 82.7 fL (ref 80.0–100.0)
Monocytes Absolute: 0.7 10*3/uL (ref 0.1–1.0)
Monocytes Relative: 12 %
Neutro Abs: 3.1 10*3/uL (ref 1.7–7.7)
Neutrophils Relative %: 51 %
Platelets: 85 10*3/uL — ABNORMAL LOW (ref 150–400)
RBC: 3.07 MIL/uL — ABNORMAL LOW (ref 4.22–5.81)
RDW: 16.5 % — ABNORMAL HIGH (ref 11.5–15.5)
WBC: 6.2 10*3/uL (ref 4.0–10.5)
nRBC: 0 % (ref 0.0–0.2)

## 2019-01-31 MED ORDER — PANTOPRAZOLE SODIUM 40 MG PO TBEC
40.0000 mg | DELAYED_RELEASE_TABLET | Freq: Every day | ORAL | Status: DC
  Administered 2019-01-31: 40 mg via ORAL
  Filled 2019-01-31: qty 1

## 2019-01-31 MED ORDER — PENICILLIN G BENZATHINE 1200000 UNIT/2ML IM SUSP: 2.4000 10*6.[IU] | Freq: Once | INTRAMUSCULAR | 0 refills | Status: AC

## 2019-01-31 MED ORDER — OXYCODONE-ACETAMINOPHEN 5-325 MG PO TABS: 1.0000 | ORAL_TABLET | ORAL | 0 refills | Status: DC | PRN

## 2019-01-31 NOTE — Progress Notes (Signed)
PIV removed X 2 without complication. Pt changed into paper scrubs for transport. External catheter removed without complication. Pt transferred by EMT. Full report given to receiving RN [place] on [employer] at [facility].

## 2019-01-31 NOTE — Progress Notes (Signed)
PT Cancellation Note  Patient Details Name: Bruce Hayes MRN: [id number] DOB: [birth year]   Cancelled Treatment:    Reason Eval/Treat Not Completed: Patient declined, no reason specified(pt declined as he is being discharged today and does not want to be delayed form going to facility or be too worn out.)   Gwynneth Albright., PT, DPT Physical Therapist with [facility]  01/31/2019 2:52 PM

## 2019-01-31 NOTE — Progress Notes (Signed)
Key Points: Use following P&T approved IV to PO non-antibiotic change policy.  Description contains the criteria that are approved Note: Policy Excludes:  Esophagectomy patientsPHARMACIST - PHYSICIAN COMMUNICATION DR:   Wyline Copas CONCERNING: IV to Oral Route Change Policy  RECOMMENDATION: This patient is receiving protonix by the intravenous route.  Based on criteria approved by the Pharmacy and Therapeutics Committee, the intravenous medication(s) is/are being converted to the equivalent oral dose form(s).   DESCRIPTION: These criteria include:  The patient is eating (either orally or via tube) and/or has been taking other orally administered medications for a least 24 hours  The patient has no evidence of active gastrointestinal bleeding or impaired GI absorption (gastrectomy, short bowel, patient on TNA or NPO).  If you have questions about this conversion, please contact the Pharmacy Department  []   [phone] )  Bruce Hayes []   [phone] )  Bruce Hayes  []   [phone] )  [facility] [x]   [phone] )  [place], [place], [facility] 01/31/2019 7:29 AM

## 2019-01-31 NOTE — TOC Progression Note (Addendum)
Transition of Care [facility]) - Progression Note    Patient Details  Name: Bruce Hayes MRN: [id number] Date of Birth: 08-29-1945  Transition of Care [facility]) CM/SW Contact  Purcell Mouton, RN Phone Number: 01/31/2019, 1:19 PM  Clinical Narrative:    Spoke with Administrative Director Olivia Mackie pt can return today.    Expected Discharge Plan: Skilled Nursing Facility Barriers to Discharge: No Barriers Identified  Expected Discharge Plan and Services Expected Discharge Plan: [place]   Discharge Planning Services: CM Consult Post Acute Care Choice: [place] Living arrangements for the past 2 months: Grand[place] Expected Discharge Date: 01/31/19                                     Social Determinants of Health (SDOH) Interventions    Readmission Risk Interventions Readmission Risk Prevention Plan 12/30/2018 11/02/2018  Transportation Screening Complete Complete  PCP or Specialist Appt within 3-5 Days - Not Complete  Not Complete comments - plan for SNF  HRI or [place] - Not Complete  HRI or Home Care Consult comments - plan for SNF  Social Work Consult for [place] Planning/Counseling - Complete  Palliative Care Screening - Not Applicable  Medication Review (RN Care Manager) Complete Complete  PCP or Specialist appointment within 3-5 days of discharge Not Complete -  PCP/Specialist Appt Not Complete comments Patient will make own pcp appt;not ready for d/c yet. -  HRI or Home Care Consult Complete -  SW Recovery Care/Counseling Consult Complete -  Palliative Care Screening Not Applicable -  [place] Not Applicable -  Some recent data might be hidden

## 2019-01-31 NOTE — TOC Progression Note (Signed)
Transition of Care [facility]) - Progression Note    Patient Details  Name: Bruce Hayes MRN: [id number] Date of Birth: 07/30/1945  Transition of Care [facility]) CM/SW Contact  Purcell Mouton, RN Phone Number: 01/31/2019, 12:34 PM  Clinical Narrative:    TOC/CM have made several calls to [facility] and text admission coordinator concerning plans to discharge back. Facility states there was no information of pt returning today. DON and Admission Coordinator was not in. Held on the telephone fourth time for 15 mins.    Expected Discharge Plan: Skilled Nursing Facility Barriers to Discharge: No Barriers Identified  Expected Discharge Plan and Services Expected Discharge Plan: [place]   Discharge Planning Services: CM Consult Post Acute Care Choice: [place] Living arrangements for the past 2 months: [place] Expected Discharge Date: 01/31/19                                     Social Determinants of Health (SDOH) Interventions    Readmission Risk Interventions Readmission Risk Prevention Plan 12/30/2018 11/02/2018  Transportation Screening Complete Complete  PCP or Specialist Appt within 3-5 Days - Not Complete  Not Complete comments - plan for SNF  HRI or [place] - Not Complete  HRI or Home Care Consult comments - plan for SNF  Social Work Consult for [place] Planning/Counseling - Complete  Palliative Care Screening - Not Applicable  Medication Review (RN Care Manager) Complete Complete  PCP or Specialist appointment within 3-5 days of discharge Not Complete -  PCP/Specialist Appt Not Complete comments Patient will make own pcp appt;not ready for d/c yet. -  HRI or Home Care Consult Complete -  SW Recovery Care/Counseling Consult Complete -  Palliative Care Screening Not Applicable -  [place] Not Applicable -  Some recent data might be hidden

## 2019-01-31 NOTE — TOC Progression Note (Signed)
Transition of Care [facility]) - Progression Note    Patient Details  Name: Bruce Hayes MRN: [id number] Date of Birth: [birth year]  Transition of Care [facility]) CM/SW [place] called and asked that pt be informed that he was in his co-pay days, facility will work with him.  Pt states that he has [employer] and should not have co pay days. Encouraged pt to speak with [facility] concerning that.      Purcell Mouton, RN Phone Number: 01/31/2019, 2:15 PM    Expected Discharge Plan: [place] Barriers to Discharge: No Barriers Identified  Expected Discharge Plan and Services Expected Discharge Plan: [place]   Discharge Planning Services: CM Consult Post Acute Care Choice: [place] Living arrangements for the past 2 months: [place] Expected Discharge Date: 01/31/19                                     Social Determinants of Health (SDOH) Interventions    Readmission Risk Interventions Readmission Risk Prevention Plan 12/30/2018 11/02/2018  Transportation Screening Complete Complete  PCP or Specialist Appt within 3-5 Days - Not Complete  Not Complete comments - plan for SNF  HRI or [place] - Not Complete  HRI or Home Care Consult comments - plan for SNF  Social Work Consult for [place] Planning/Counseling - Complete  Palliative Care Screening - Not Applicable  Medication Review (RN Care Manager) Complete Complete  PCP or Specialist appointment within 3-5 days of discharge Not Complete -  PCP/Specialist Appt Not Complete comments Patient will make own pcp appt;not ready for d/c yet. -  HRI or Home Care Consult Complete -  SW Recovery Care/Counseling Consult Complete -  Palliative Care Screening Not Applicable -  [place] Not Applicable -  Some recent data might be hidden

## 2019-01-31 NOTE — Discharge Summary (Signed)
Physician Discharge Summary  MARKAVIOUS MICCO ZDG:[id number] DOB: [birth year] DOA: 01/24/2019  PCP: Lorene Dy, MD  Admit date: 01/24/2019 Discharge date: 01/31/2019  [name]  Recommendations for Outpatient Follow-up:  1. Follow up with PCP in 1-2 weeks  Discharge Condition:Improved CODE STATUS:DNR Diet recommendation: Regular   Brief/Interim Summary: 74 year old male chronic kidney disease stage IV, cirrhosis, hypertension, resident of a skilled nursing facility was brought to the hospital with 3 days of progressive altered mental status to the point that he became nonverbal. He was found to have acute kidney injury with associated hyperkalemia. He was febrile, hypotensive and had elevated lactic acid. Urinalysis indicated possible infection. He was admitted for further management of sepsis secondary to urinary tract infection.  The patient's creatinine has improved from 3.9 on presentation to 2.5 today. His baseline is 2.2. Nephrology has signed off  Discharge Diagnoses:  Active Problems:   HEPATITIS C   THROMBOCYTOPENIA   Essential hypertension   AKI (acute kidney injury) ([facility])   Other cirrhosis of liver (H[facility])   Encephalopathy   Sepsis ([place])   E coli bacteremia   CKD (chronic kidney disease), stage IV (HCC)   Positive RPR test   History of anemia due to chronic kidney disease   Urinary tract infection   Sepsis secondary to E. coli bacteremia from urinary tract infection, present on admit: the patient has completed 5 days of IV Antibiotics. Pt remains afebrile. Surveillance cultures have had no growth. Now on oral antibiotics.Renal ultrasound does without evidence of hydronephrosis. Sepsis physiology resolved and pt currently stable  Acute metabolic encephalopathy. Resolved.Secondary to sepsis, azotemia, hepatic encephalopathy. Ammonia levels improved, sepsis physiology resolved. Mentation seems to be  baseline  Acute kidney injury on chronic kidney disease stage IV. Secondary to sepsis. Baseline creatinine approximately 2.5. Nephrology following and he was briefly placed on a bicarb drip due to metabolic acidosis. Overall renal function is improving and nephrology had signed off  Anemia of chronic kidney disease. Baseline hemoglobin 10-11. No evidence of bleeding. Hemoglobinwas 10.8 on 01/28/2019. This morning 8.5. No obvious sign of blood loss. Hgb had been trending down. Hemodynamically stable.  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 Platelets have been as high as 225 on 12/29/2018, trended down to 82. Plts now trending up  Hyponatremia. Resolved. Secondary to poor oral intake.  Alcoholic/hepatitis C cirrhosis. Ammonia level has improved. Mentation stable  Hypertension. Continue on home Coreg and clonidine patch. BP stable at this time  Positive RPR. Dr. Benny Lennert reviewed with infectious disease who recommended 2 doses of penicillin, 1 week apart. First dose was on 12/27. Final dose of penicillin on 02/02/19  Deconditioning. Seen by physical therapy with recommendations for skilled nursing facility placement. Pt initially wanted to go home instead, however later agrees with SNF placement   Discharge Instructions   Allergies as of 01/31/2019      Reactions   Tylenol [acetaminophen] Other (See Comments)   Liver condition      Medication List    STOP taking these medications   amLODipine 10 MG tablet Commonly known as: NORVASC   omeprazole 20 MG capsule Commonly known as: PRILOSEC   oxycodone 5 MG capsule Commonly known as: OXY-IR   predniSONE 10 MG tablet Commonly known as: DELTASONE   thiamine 100 MG tablet     TAKE these medications   carvedilol 3.125 MG tablet Commonly known as: COREG Take 1 tablet (3.125 mg total) by mouth 2 (two) times daily with  a meal.   cloNIDine 0.3 mg/24hr patch Commonly known as: CATAPRES - Dosed in  mg/24 hr Place 1 patch (0.3 mg total) onto the skin once a week.   folic acid 1 MG tablet Commonly known as: FOLVITE Take 1 tablet (1 mg total) by mouth daily.   lactulose 10 GM/15ML solution Commonly known as: CHRONULAC Take 30 mLs (20 g total) by mouth 2 (two) times daily.   levothyroxine 50 MCG tablet Commonly known as: SYNTHROID Take 50 mcg by mouth daily before breakfast.   oxyCODONE-acetaminophen 5-325 MG tablet Commonly known as: Percocet Take 1 tablet by mouth every 4 (four) hours as needed for severe pain.   penicillin g benzathine 1200000 UNIT/2ML Susp injection Commonly known as: BICILLIN LA Inject 4 mLs (2.4 Million Units total) into the muscle once for 1 dose. Start taking on: February 02, 2019   sertraline 25 MG tablet Commonly known as: ZOLOFT Take 25 mg by mouth daily.      Follow-up Information    Lorene Dy, MD. Schedule an appointment as soon as possible for a visit in 1 week(s).   Specialty: Internal Medicine Contact information: [address], [address] [place] [address] [phone]          Allergies  Allergen Reactions  . Tylenol [Acetaminophen] Other (See Comments)    Liver condition    Consultations:  Nephrology  Procedures/Studies: US Renal  Result Date: 01/24/2019 CLINICAL DATA:  Renal failure. EXAM: RENAL / URINARY TRACT ULTRASOUND COMPLETE COMPARISON:  Renal ultrasound 12/30/2018. FINDINGS: Right Kidney: Renal measurements: 10.7 x 4.3 x 4.0 cm = volume: 88.3 mL. Mild cortical thinning and increased cortical echogenicity. No focal lesion or hydronephrosis. Left Kidney: Renal measurements: 10.7 x 4.8 x 4.4 cm = volume: 117 mL. Echogenicity within normal limits. No mass or hydronephrosis visualized. Bladder: The bladder appears mildly distended with a volume of 101 cc. No focal abnormality identified. Patient unable to void for the examination. Other: None. IMPRESSION: 1. The kidneys are stable in size without hydronephrosis. 2.  Mild cortical thinning and increased echogenicity on the right, suggesting medical renal disease. 3. Mildly distended urinary bladder. Electronically Signed   By: Richardean Sale M.D.   On: 01/24/2019 17:17   DG Chest Port 1 View  Result Date: 01/24/2019 CLINICAL DATA:  74 year old patient in a rehabilitation facility presenting with acute mental status changes. EXAM: PORTABLE CHEST 1 VIEW COMPARISON:  10/30/2018 and earlier. FINDINGS: Suboptimal inspiration. Heart size normal for AP portable technique, unchanged. Lungs clear. Bronchovascular markings normal. Pulmonary vascularity normal. No visible pleural effusions. No pneumothorax. IMPRESSION: Suboptimal inspiration. No acute cardiopulmonary disease. Electronically Signed   By: Evangeline Dakin M.D.   On: 01/24/2019 14:30   DG Shoulder Left Port  Result Date: 01/27/2019 CLINICAL DATA:  Left anterior shoulder pain EXAM: LEFT SHOULDER COMPARISON:  None. FINDINGS: There is no fracture or dislocation. There are moderate degenerative changes of the acromioclavicular joint. IMPRESSION: No acute osseous injury of the left shoulder. Electronically Signed   By: Kathreen Devoid   On: 01/27/2019 13:26     Subjective: Eager to go to SNF  Discharge Exam: Vitals:   01/30/19 1258 01/31/19 0639  BP: (!) 149/63 (!) 154/62  Pulse: 67 (!) 54  Resp: 16 16  Temp: 98.4 F (36.9 C) (!) 97.4 F (36.3 C)  SpO2: 100% 100%   Vitals:   01/29/19 2050 01/30/19 0611 01/30/19 1258 01/31/19 0639  BP: (!) 123/50 (!) 132/56 (!) 149/63 (!) 154/62  Pulse: (!) 57 Marland Kitchen)  57 67 (!) 54  Resp: 18 16 16 16   Temp: 97.6 F (36.4 C) 98.3 F (36.8 C) 98.4 F (36.9 C) (!) 97.4 F (36.3 C)  TempSrc: Oral Oral Oral Oral  SpO2: 99% 99% 100% 100%  Weight:      Height:        General: Pt is alert, awake, not in acute distress Cardiovascular: RRR, S1/S2 +, no rubs, no gallops Respiratory: CTA bilaterally, no wheezing, no rhonchi Abdominal: Soft, NT, ND, bowel sounds  + Extremities: no edema, no cyanosis   The results of significant diagnostics from this hospitalization (including imaging, microbiology, ancillary and laboratory) are listed below for reference.     Microbiology: Recent Results (from the past 240 hour(s))  Blood culture (routine x 2)     Status: Abnormal   Collection Time: 01/24/19  2:40 PM   Specimen: BLOOD  Result Value Ref Range Status   Specimen Description   Final    BLOOD RIGHT ANTECUBITAL Performed at [facility] [address] [place], [place] [address]    Special Requests   Final    BOTTLES DRAWN AEROBIC AND ANAEROBIC Blood Culture results may not be optimal due to an inadequate volume of blood received in culture bottles Performed at [facility] [address], [place], [place] [address]    Culture  Setup Time   Final    GRAM NEGATIVE RODS IN BOTH AEROBIC AND ANAEROBIC BOTTLES CRITICAL RESULT CALLED TO, READ BACK BY AND VERIFIED WITH: PHARMD ANH PHAM [phone] FCP Performed at [facility], [place] [address] [place], [place] [address]    Culture ESCHERICHIA COLI (A)  Final   Report Status 01/27/2019 FINAL  Final   Organism ID, Bacteria ESCHERICHIA COLI  Final      Susceptibility   Escherichia coli - MIC*    AMPICILLIN <=2 SENSITIVE Sensitive     CEFAZOLIN <=4 SENSITIVE Sensitive     CEFEPIME <=1 SENSITIVE Sensitive     CEFTAZIDIME <=1 SENSITIVE Sensitive     CEFTRIAXONE <=1 SENSITIVE Sensitive     CIPROFLOXACIN <=0.25 SENSITIVE Sensitive     GENTAMICIN <=1 SENSITIVE Sensitive     IMIPENEM <=0.25 SENSITIVE Sensitive     TRIMETH/SULFA <=20 SENSITIVE Sensitive     AMPICILLIN/SULBACTAM <=2 SENSITIVE Sensitive     PIP/TAZO <=4 SENSITIVE Sensitive     * ESCHERICHIA COLI  Blood culture (routine x 2)     Status: Abnormal   Collection Time: 01/24/19  2:40 PM   Specimen: BLOOD RIGHT ARM  Result Value Ref Range Status   Specimen Description   Final    BLOOD RIGHT  ARM Performed at [facility], [place] [address], [place], [place] [address]    Special Requests   Final    BOTTLES DRAWN AEROBIC AND ANAEROBIC Blood Culture results may not be optimal due to an inadequate volume of blood received in culture bottles Performed at [facility] [address] [place], [place] [address]    Culture  Setup Time   Final    GRAM NEGATIVE RODS IN BOTH AEROBIC AND ANAEROBIC BOTTLES CRITICAL VALUE NOTED.  VALUE IS CONSISTENT WITH PREVIOUSLY REPORTED AND CALLED VALUE.    Culture (A)  Final    ESCHERICHIA COLI SUSCEPTIBILITIES PERFORMED ON PREVIOUS CULTURE WITHIN THE LAST 5 DAYS. Performed at [facility], [place] [address] [place], [place] [address]    Report Status 01/27/2019 FINAL  Final  Blood Culture  ID Panel (Reflexed)     Status: Abnormal   Collection Time: 01/24/19  2:40 PM  Result Value Ref Range Status   Enterococcus species NOT DETECTED NOT DETECTED Final   Listeria monocytogenes NOT DETECTED NOT DETECTED Final   Staphylococcus species NOT DETECTED NOT DETECTED Final   Staphylococcus aureus (BCID) NOT DETECTED NOT DETECTED Final   Streptococcus species NOT DETECTED NOT DETECTED Final   Streptococcus agalactiae NOT DETECTED NOT DETECTED Final   Streptococcus pneumoniae NOT DETECTED NOT DETECTED Final   Streptococcus pyogenes NOT DETECTED NOT DETECTED Final   Acinetobacter baumannii NOT DETECTED NOT DETECTED Final   Enterobacteriaceae species DETECTED (A) NOT DETECTED Final    Comment: Enterobacteriaceae represent a large family of gram-negative bacteria, not a single organism. CRITICAL RESULT CALLED TO, READ BACK BY AND VERIFIED WITH: PHARMD ANH PHAM [phone] FCP    Enterobacter cloacae complex NOT DETECTED NOT DETECTED Final   Escherichia coli DETECTED (A) NOT DETECTED Final    Comment: CRITICAL RESULT CALLED TO, READ BACK BY AND VERIFIED WITH: PHARMD ANH PHAM [phone] FCP    Klebsiella oxytoca NOT DETECTED NOT  DETECTED Final   Klebsiella pneumoniae NOT DETECTED NOT DETECTED Final   Proteus species NOT DETECTED NOT DETECTED Final   Serratia marcescens NOT DETECTED NOT DETECTED Final   Carbapenem resistance NOT DETECTED NOT DETECTED Final   Haemophilus influenzae NOT DETECTED NOT DETECTED Final   Neisseria meningitidis NOT DETECTED NOT DETECTED Final   Pseudomonas aeruginosa NOT DETECTED NOT DETECTED Final   Candida albicans NOT DETECTED NOT DETECTED Final   Candida glabrata NOT DETECTED NOT DETECTED Final   Candida krusei NOT DETECTED NOT DETECTED Final   Candida parapsilosis NOT DETECTED NOT DETECTED Final   Candida tropicalis NOT DETECTED NOT DETECTED Final    Comment: Performed at [facility], [place]. [address] [place], [place] [address]  Urine culture     Status: Abnormal   Collection Time: 01/24/19  4:48 PM   Specimen: Urine, Clean Catch  Result Value Ref Range Status   Specimen Description   Final    URINE, CLEAN CATCH Performed at [facility], [place] [address] [place], [place] [address]    Special Requests   Final    NONE Performed at [facility], [place] [address], [place], [place] [address]    Culture 30,000 COLONIES/mL ESCHERICHIA COLI (A)  Final   Report Status 01/26/2019 FINAL  Final   Organism ID, Bacteria ESCHERICHIA COLI (A)  Final      Susceptibility   Escherichia coli - MIC*    AMPICILLIN <=2 SENSITIVE Sensitive     CEFAZOLIN <=4 SENSITIVE Sensitive     CEFTRIAXONE <=1 SENSITIVE Sensitive     CIPROFLOXACIN <=0.25 SENSITIVE Sensitive     GENTAMICIN <=1 SENSITIVE Sensitive     IMIPENEM <=0.25 SENSITIVE Sensitive     NITROFURANTOIN <=16 SENSITIVE Sensitive     TRIMETH/SULFA <=20 SENSITIVE Sensitive     AMPICILLIN/SULBACTAM <=2 SENSITIVE Sensitive     PIP/TAZO <=4 SENSITIVE Sensitive     * 30,000 COLONIES/mL ESCHERICHIA COLI  SARS CORONAVIRUS 2 (TAT 6-24 HRS) Nasopharyngeal Nasopharyngeal Swab     Status: None   Collection  Time: 01/24/19  5:42 PM   Specimen: Nasopharyngeal Swab  Result Value Ref Range Status   SARS Coronavirus 2 NEGATIVE NEGATIVE Final    Comment: (NOTE) SARS-CoV-2 target nucleic acids are NOT DETECTED. The SARS-CoV-2 RNA is generally detectable in upper and lower respiratory specimens during  the acute phase of infection. Negative results do not preclude SARS-CoV-2 infection, do not rule out co-infections with other pathogens, and should not be used as the sole basis for treatment or other patient management decisions. Negative results must be combined with clinical observations, patient history, and epidemiological information. The expected result is Negative. Fact Sheet for Patients: SugarRoll.be Fact Sheet for Healthcare Providers: https://www.woods-mathews.com/ This test is not yet approved or cleared by the [place] FDA and  has been authorized for detection and/or diagnosis of SARS-CoV-2 by FDA under an Emergency Use Authorization (EUA). This EUA will remain  in effect (meaning this test can be used) for the duration of the COVID-19 declaration under Section 56 4(b)(1) of the Act, 21 U.S.C. section 360bbb-3(b)(1), unless the authorization is terminated or revoked sooner. Performed at [facility], [place] [address], [place], [place] [address]   Culture, blood (routine x 2)     Status: None (Preliminary result)   Collection Time: 01/28/19 12:11 PM   Specimen: BLOOD RIGHT HAND  Result Value Ref Range Status   Specimen Description   Final    BLOOD RIGHT HAND Performed at [facility] [address] [place], [place] [address]    Special Requests   Final    BOTTLES DRAWN AEROBIC AND ANAEROBIC Blood Culture adequate volume Performed at [facility] [address] [place], [place] [address]    Culture   Final    NO GROWTH 2 DAYS Performed at [facility] [address]  [place], [place] [address]    Report Status PENDING  Incomplete  Culture, blood (routine x 2)     Status: None (Preliminary result)   Collection Time: 01/28/19 12:24 PM   Specimen: BLOOD LEFT HAND  Result Value Ref Range Status   Specimen Description   Final    BLOOD LEFT HAND Performed at [facility] [address], [place], [place] [address]    Special Requests   Final    BOTTLES DRAWN AEROBIC ONLY Blood Culture results may not be optimal due to an inadequate volume of blood received in culture bottles Performed at [facility] [address] [place], [place] [address]    Culture   Final    NO GROWTH 2 DAYS Performed at [facility] [address] [place],  [address]    Report Status PENDING  Incomplete  SARS CORONAVIRUS 2 (TAT 6-24 HRS) Nasopharyngeal Nasopharyngeal Swab     Status: None   Collection Time: 01/30/19  3:06 PM   Specimen: Nasopharyngeal Swab  Result Value Ref Range Status   SARS Coronavirus 2 NEGATIVE NEGATIVE Final    Comment: (NOTE) SARS-CoV-2 target nucleic acids are NOT DETECTED. The SARS-CoV-2 RNA is generally detectable in upper and lower respiratory specimens during the acute phase of infection. Negative results do not preclude SARS-CoV-2 infection, do not rule out co-infections with other pathogens, and should not be used as the sole basis for treatment or other patient management decisions. Negative results must be combined with clinical observations, patient history, and epidemiological information. The expected result is Negative. Fact Sheet for Patients: SugarRoll.be Fact Sheet for Healthcare Providers: https://www.woods-mathews.com/ This test is not yet approved or cleared by the [place] FDA and  has been authorized for detection and/or diagnosis of SARS-CoV-2 by FDA under an Emergency Use Authorization (EUA). This EUA will remain  in effect (meaning  this test can be used) for the duration  of the COVID-19 declaration under Section 56 4(b)(1) of the Act, 21 U.S.C. section 360bbb-3(b)(1), unless the authorization is terminated or revoked sooner. Performed at [facility], [place] [address], [place], [place] [address]      Labs: BNP (last 3 results) Recent Labs    01/25/19 0214  BNP 211.9*   Basic Metabolic Panel: Recent Labs  Lab 01/25/19 0214 01/27/19 0248 01/28/19 0459 01/29/19 0538 01/30/19 0525 01/31/19 0422  NA 148* 145 140 135 131*  131* 139  K 5.6* 3.7 4.1 3.4* 3.8  3.8 4.1  CL 120* 109 107 105 102  102 109  CO2 19* 25 22 22  20*  20* 21*  GLUCOSE 144* 126* 91 110* 98  98 112*  BUN 80* 67* 55* 52* 41*  42* 37*  CREATININE 3.33* 3.19* 2.54* 2.34* 1.98*  2.02* 1.82*  CALCIUM 7.6* 7.4* 7.7* 7.3* 7.1*  7.1* 7.4*  MG 2.7*  --   --   --   --   --   PHOS 4.6 3.9 3.4 3.3 2.3* 2.5   Liver Function Tests: Recent Labs  Lab 01/24/19 1440 01/27/19 0248 01/28/19 0459 01/29/19 0538 01/30/19 0525 01/31/19 0422  AST 53*  --   --  52* 54*  --   ALT 35  --   --  21 14  --   ALKPHOS 166*  --   --  88 86  --   BILITOT 3.5*  --   --  2.9* 3.0*  --   PROT 7.8  --   --  5.6* 5.3*  --   ALBUMIN 1.9* 1.5* 1.6* 1.4*  1.5* 1.4*  1.4* 1.4*   No results for input(s): LIPASE, AMYLASE in the last 168 hours. Recent Labs  Lab 01/24/19 1440 01/26/19 0238  AMMONIA 63* 26   CBC: Recent Labs  Lab 01/27/19 0248 01/28/19 0459 01/29/19 0538 01/30/19 0525 01/31/19 0422  WBC 8.7 7.4 6.6 6.7 6.2  NEUTROABS 5.0 3.6 3.4 3.6 3.1  HGB 9.3* 10.8* 8.5* 8.1* 8.5*  HCT 27.7* 32.7* 24.9* 24.0* 25.4*  MCV 83.2 83.8 82.2 83.0 82.7  PLT 89* 91* 88* 82* 85*   Cardiac Enzymes: Recent Labs  Lab 01/25/19 0517  CKTOTAL 102   BNP: Invalid input(s): POCBNP CBG: Recent Labs  Lab 01/24/19 1350 01/24/19 1740  GLUCAP 68* 107*   D-Dimer No results for input(s): DDIMER in the last 72 hours. Hgb A1c No results for input(s):  HGBA1C in the last 72 hours. Lipid Profile No results for input(s): CHOL, HDL, LDLCALC, TRIG, CHOLHDL, LDLDIRECT in the last 72 hours. Thyroid function studies No results for input(s): TSH, T4TOTAL, T3FREE, THYROIDAB in the last 72 hours.  Invalid input(s): FREET3 Anemia work up No results for input(s): VITAMINB12, FOLATE, FERRITIN, TIBC, IRON, RETICCTPCT in the last 72 hours. Urinalysis    Component Value Date/Time   COLORURINE AMBER (A) 01/24/2019 1649   APPEARANCEUR HAZY (A) 01/24/2019 1649   LABSPEC 1.014 01/24/2019 1649   PHURINE 5.0 01/24/2019 1649   GLUCOSEU NEGATIVE 01/24/2019 1649   HGBUR LARGE (A) 01/24/2019 1649   BILIRUBINUR NEGATIVE 01/24/2019 1649   KETONESUR NEGATIVE 01/24/2019 1649   PROTEINUR 30 (A) 01/24/2019 1649   UROBILINOGEN 4.0 (H) 03/31/2014 0920   NITRITE NEGATIVE 01/24/2019 1649   LEUKOCYTESUR MODERATE (A) 01/24/2019 1649   Sepsis Labs Invalid input(s): PROCALCITONIN,  WBC,  LACTICIDVEN Microbiology Recent Results (from the past 240 hour(s))  Blood culture (routine x 2)     Status: Abnormal  Collection Time: 01/24/19  2:40 PM   Specimen: BLOOD  Result Value Ref Range Status   Specimen Description   Final    BLOOD RIGHT ANTECUBITAL Performed at [facility] [address] [place], [place] [address]    Special Requests   Final    BOTTLES DRAWN AEROBIC AND ANAEROBIC Blood Culture results may not be optimal due to an inadequate volume of blood received in culture bottles Performed at [facility] [address], [place], [place] [address]    Culture  Setup Time   Final    GRAM NEGATIVE RODS IN BOTH AEROBIC AND ANAEROBIC BOTTLES CRITICAL RESULT CALLED TO, READ BACK BY AND VERIFIED WITH: PHARMD ANH PHAM [phone] FCP Performed at [facility], [place] [address] [place], [place] [address]    Culture ESCHERICHIA COLI (A)  Final   Report Status 01/27/2019 FINAL  Final   Organism ID, Bacteria ESCHERICHIA  COLI  Final      Susceptibility   Escherichia coli - MIC*    AMPICILLIN <=2 SENSITIVE Sensitive     CEFAZOLIN <=4 SENSITIVE Sensitive     CEFEPIME <=1 SENSITIVE Sensitive     CEFTAZIDIME <=1 SENSITIVE Sensitive     CEFTRIAXONE <=1 SENSITIVE Sensitive     CIPROFLOXACIN <=0.25 SENSITIVE Sensitive     GENTAMICIN <=1 SENSITIVE Sensitive     IMIPENEM <=0.25 SENSITIVE Sensitive     TRIMETH/SULFA <=20 SENSITIVE Sensitive     AMPICILLIN/SULBACTAM <=2 SENSITIVE Sensitive     PIP/TAZO <=4 SENSITIVE Sensitive     * ESCHERICHIA COLI  Blood culture (routine x 2)     Status: Abnormal   Collection Time: 01/24/19  2:40 PM   Specimen: BLOOD RIGHT ARM  Result Value Ref Range Status   Specimen Description   Final    BLOOD RIGHT ARM Performed at [facility], [place] [address], [place], [place] [address]    Special Requests   Final    BOTTLES DRAWN AEROBIC AND ANAEROBIC Blood Culture results may not be optimal due to an inadequate volume of blood received in culture bottles Performed at [facility] [address], [place], [place] [address]    Culture  Setup Time   Final    GRAM NEGATIVE RODS IN BOTH AEROBIC AND ANAEROBIC BOTTLES CRITICAL VALUE NOTED.  VALUE IS CONSISTENT WITH PREVIOUSLY REPORTED AND CALLED VALUE.    Culture (A)  Final    ESCHERICHIA COLI SUSCEPTIBILITIES PERFORMED ON PREVIOUS CULTURE WITHIN THE LAST 5 DAYS. Performed at [facility], [place] [address], [place], [place] [address]    Report Status 01/27/2019 FINAL  Final  Blood Culture ID Panel (Reflexed)     Status: Abnormal   Collection Time: 01/24/19  2:40 PM  Result Value Ref Range Status   Enterococcus species NOT DETECTED NOT DETECTED Final   Listeria monocytogenes NOT DETECTED NOT DETECTED Final   Staphylococcus species NOT DETECTED NOT DETECTED Final   Staphylococcus aureus (BCID) NOT DETECTED NOT DETECTED Final   Streptococcus species NOT DETECTED NOT DETECTED Final   Streptococcus  agalactiae NOT DETECTED NOT DETECTED Final   Streptococcus pneumoniae NOT DETECTED NOT DETECTED Final   Streptococcus pyogenes NOT DETECTED NOT DETECTED Final   Acinetobacter baumannii NOT DETECTED NOT DETECTED Final   Enterobacteriaceae species DETECTED (A) NOT DETECTED Final    Comment: Enterobacteriaceae represent a large family of gram-negative bacteria, not a single organism. CRITICAL RESULT CALLED TO, READ BACK BY AND VERIFIED  WITH: PHARMD ANH PHAM [phone] FCP    Enterobacter cloacae complex NOT DETECTED NOT DETECTED Final   Escherichia coli DETECTED (A) NOT DETECTED Final    Comment: CRITICAL RESULT CALLED TO, READ BACK BY AND VERIFIED WITH: PHARMD ANH PHAM [phone] FCP    Klebsiella oxytoca NOT DETECTED NOT DETECTED Final   Klebsiella pneumoniae NOT DETECTED NOT DETECTED Final   Proteus species NOT DETECTED NOT DETECTED Final   Serratia marcescens NOT DETECTED NOT DETECTED Final   Carbapenem resistance NOT DETECTED NOT DETECTED Final   Haemophilus influenzae NOT DETECTED NOT DETECTED Final   Neisseria meningitidis NOT DETECTED NOT DETECTED Final   Pseudomonas aeruginosa NOT DETECTED NOT DETECTED Final   Candida albicans NOT DETECTED NOT DETECTED Final   Candida glabrata NOT DETECTED NOT DETECTED Final   Candida krusei NOT DETECTED NOT DETECTED Final   Candida parapsilosis NOT DETECTED NOT DETECTED Final   Candida tropicalis NOT DETECTED NOT DETECTED Final    Comment: Performed at [facility], [address], [place], [place] [address]  Urine culture     Status: Abnormal   Collection Time: 01/24/19  4:48 PM   Specimen: Urine, Clean Catch  Result Value Ref Range Status   Specimen Description   Final    URINE, CLEAN CATCH Performed at [facility], [place] [address] [place], [place] [address]    Special Requests   Final    NONE Performed at [facility], [place] [address] [place], [place] [address]    Culture 30,000  COLONIES/mL ESCHERICHIA COLI (A)  Final   Report Status 01/26/2019 FINAL  Final   Organism ID, Bacteria ESCHERICHIA COLI (A)  Final      Susceptibility   Escherichia coli - MIC*    AMPICILLIN <=2 SENSITIVE Sensitive     CEFAZOLIN <=4 SENSITIVE Sensitive     CEFTRIAXONE <=1 SENSITIVE Sensitive     CIPROFLOXACIN <=0.25 SENSITIVE Sensitive     GENTAMICIN <=1 SENSITIVE Sensitive     IMIPENEM <=0.25 SENSITIVE Sensitive     NITROFURANTOIN <=16 SENSITIVE Sensitive     TRIMETH/SULFA <=20 SENSITIVE Sensitive     AMPICILLIN/SULBACTAM <=2 SENSITIVE Sensitive     PIP/TAZO <=4 SENSITIVE Sensitive     * 30,000 COLONIES/mL ESCHERICHIA COLI  SARS CORONAVIRUS 2 (TAT 6-24 HRS) Nasopharyngeal Nasopharyngeal Swab     Status: None   Collection Time: 01/24/19  5:42 PM   Specimen: Nasopharyngeal Swab  Result Value Ref Range Status   SARS Coronavirus 2 NEGATIVE NEGATIVE Final    Comment: (NOTE) SARS-CoV-2 target nucleic acids are NOT DETECTED. The SARS-CoV-2 RNA is generally detectable in upper and lower respiratory specimens during the acute phase of infection. Negative results do not preclude SARS-CoV-2 infection, do not rule out co-infections with other pathogens, and should not be used as the sole basis for treatment or other patient management decisions. Negative results must be combined with clinical observations, patient history, and epidemiological information. The expected result is Negative. Fact Sheet for Patients: SugarRoll.be Fact Sheet for Healthcare Providers: https://www.woods-mathews.com/ This test is not yet approved or cleared by the [place] FDA and  has been authorized for detection and/or diagnosis of SARS-CoV-2 by FDA under an Emergency Use Authorization (EUA). This EUA will remain  in effect (meaning this test can be used) for the duration of the COVID-19 declaration under Section 56 4(b)(1) of the Act, 21 U.S.C. section  360bbb-3(b)(1), unless the authorization is terminated or revoked sooner. Performed at [facility], [place] [address] [place], [place] [address]   Culture, blood (routine x 2)     Status: None (Preliminary result)   Collection Time: 01/28/19 12:11 PM   Specimen: BLOOD RIGHT HAND  Result Value Ref Range Status   Specimen Description   Final    BLOOD RIGHT HAND Performed at [place] [address] [place], [place] [address]    Special Requests   Final    BOTTLES DRAWN AEROBIC AND ANAEROBIC Blood Culture adequate volume Performed at [place] [address], [place], [place] [address]    Culture   Final    NO GROWTH 2 DAYS Performed at [place] [address], [place], [place] [address]    Report Status PENDING  Incomplete  Culture, blood (routine x 2)     Status: None (Preliminary result)   Collection Time: 01/28/19 12:24 PM   Specimen: BLOOD LEFT HAND  Result Value Ref Range Status   Specimen Description   Final    BLOOD LEFT HAND Performed at [place] [address], [place], [place] [address]    Special Requests   Final    BOTTLES DRAWN AEROBIC ONLY Blood Culture results may not be optimal due to an inadequate volume of blood received in culture bottles Performed at [place] [address] [place], [place] [address]    Culture   Final    NO GROWTH 2 DAYS Performed at [place] [address] [place], [place] [address]    Report Status PENDING  Incomplete  SARS CORONAVIRUS 2 (TAT 6-24 HRS) Nasopharyngeal Nasopharyngeal Swab     Status: None   Collection Time: 01/30/19  3:06 PM   Specimen: Nasopharyngeal Swab  Result Value Ref Range Status   SARS Coronavirus 2 NEGATIVE NEGATIVE Final    Comment: (NOTE) SARS-CoV-2 target nucleic acids are NOT DETECTED. The SARS-CoV-2 RNA is generally detectable in upper and lower respiratory specimens during the  acute phase of infection. Negative results do not preclude SARS-CoV-2 infection, do not rule out co-infections with other pathogens, and should not be used as the sole basis for treatment or other patient management decisions. Negative results must be combined with clinical observations, patient history, and epidemiological information. The expected result is Negative. Fact Sheet for Patients: SugarRoll.be Fact Sheet for Healthcare Providers: https://www.woods-mathews.com/ This test is not yet approved or cleared by the [place] FDA and  has been authorized for detection and/or diagnosis of SARS-CoV-2 by FDA under an Emergency Use Authorization (EUA). This EUA will remain  in effect (meaning this test can be used) for the duration of the COVID-19 declaration under Section 56 4(b)(1) of the Act, 21 U.S.C. section 360bbb-3(b)(1), unless the authorization is terminated or revoked sooner. Performed at [facility], [place] [address] [place], [place] [address]    Time spent: 30 min  SIGNED:   Marylu Lund, MD  [facility] 01/31/2019, 10:39 AM  If 7PM-7AM, please contact night-coverage

## 2019-02-02 LAB — CULTURE, BLOOD (ROUTINE X 2)
Culture: NO GROWTH
Culture: NO GROWTH
Special Requests: ADEQUATE

## 2019-02-26 ENCOUNTER — Other Ambulatory Visit (HOSPITAL_COMMUNITY): Payer: Self-pay

## 2019-02-26 DIAGNOSIS — R1310 Dysphagia, unspecified: Secondary | ICD-10-CM

## 2019-03-04 ENCOUNTER — Emergency Department (HOSPITAL_COMMUNITY): Payer: Medicare Other

## 2019-03-04 ENCOUNTER — Encounter (HOSPITAL_COMMUNITY): Payer: Self-pay

## 2019-03-04 ENCOUNTER — Inpatient Hospital Stay (HOSPITAL_COMMUNITY)
Admission: EM | Admit: 2019-03-04 | Discharge: 2019-03-12 | DRG: 432 | Disposition: A | Discharge status: Skilled Nursing Facility | Payer: Medicare Other | Attending: Internal Medicine | Admitting: Internal Medicine

## 2019-03-04 ENCOUNTER — Other Ambulatory Visit: Payer: Self-pay

## 2019-03-04 DIAGNOSIS — R531 Weakness: Secondary | ICD-10-CM

## 2019-03-04 DIAGNOSIS — F17210 Nicotine dependence, cigarettes, uncomplicated: Secondary | ICD-10-CM | POA: Diagnosis present

## 2019-03-04 DIAGNOSIS — E039 Hypothyroidism, unspecified: Secondary | ICD-10-CM | POA: Diagnosis present

## 2019-03-04 DIAGNOSIS — N1832 Chronic kidney disease, stage 3b: Secondary | ICD-10-CM | POA: Diagnosis not present

## 2019-03-04 DIAGNOSIS — K3189 Other diseases of stomach and duodenum: Secondary | ICD-10-CM | POA: Diagnosis present

## 2019-03-04 DIAGNOSIS — E872 Acidosis, unspecified: Secondary | ICD-10-CM | POA: Diagnosis present

## 2019-03-04 DIAGNOSIS — R1310 Dysphagia, unspecified: Secondary | ICD-10-CM | POA: Diagnosis present

## 2019-03-04 DIAGNOSIS — R296 Repeated falls: Secondary | ICD-10-CM

## 2019-03-04 DIAGNOSIS — M25552 Pain in left hip: Secondary | ICD-10-CM | POA: Diagnosis present

## 2019-03-04 DIAGNOSIS — Z66 Do not resuscitate: Secondary | ICD-10-CM | POA: Diagnosis present

## 2019-03-04 DIAGNOSIS — L89322 Pressure ulcer of left buttock, stage 2: Secondary | ICD-10-CM | POA: Diagnosis present

## 2019-03-04 DIAGNOSIS — Z20822 Contact with and (suspected) exposure to covid-19: Secondary | ICD-10-CM | POA: Diagnosis present

## 2019-03-04 DIAGNOSIS — K766 Portal hypertension: Secondary | ICD-10-CM | POA: Diagnosis present

## 2019-03-04 DIAGNOSIS — N183 Chronic kidney disease, stage 3 unspecified: Secondary | ICD-10-CM | POA: Diagnosis present

## 2019-03-04 DIAGNOSIS — K7469 Other cirrhosis of liver: Secondary | ICD-10-CM | POA: Diagnosis present

## 2019-03-04 DIAGNOSIS — M25551 Pain in right hip: Secondary | ICD-10-CM | POA: Diagnosis present

## 2019-03-04 DIAGNOSIS — Z79890 Hormone replacement therapy (postmenopausal): Secondary | ICD-10-CM

## 2019-03-04 DIAGNOSIS — Z8619 Personal history of other infectious and parasitic diseases: Secondary | ICD-10-CM

## 2019-03-04 DIAGNOSIS — I8500 Esophageal varices without bleeding: Secondary | ICD-10-CM | POA: Diagnosis not present

## 2019-03-04 DIAGNOSIS — D62 Acute posthemorrhagic anemia: Secondary | ICD-10-CM | POA: Diagnosis present

## 2019-03-04 DIAGNOSIS — Z79899 Other long term (current) drug therapy: Secondary | ICD-10-CM

## 2019-03-04 DIAGNOSIS — L89312 Pressure ulcer of right buttock, stage 2: Secondary | ICD-10-CM | POA: Diagnosis present

## 2019-03-04 DIAGNOSIS — K7030 Alcoholic cirrhosis of liver without ascites: Secondary | ICD-10-CM | POA: Diagnosis not present

## 2019-03-04 DIAGNOSIS — K769 Liver disease, unspecified: Secondary | ICD-10-CM | POA: Diagnosis present

## 2019-03-04 DIAGNOSIS — Z23 Encounter for immunization: Secondary | ICD-10-CM

## 2019-03-04 DIAGNOSIS — I10 Essential (primary) hypertension: Secondary | ICD-10-CM | POA: Diagnosis present

## 2019-03-04 DIAGNOSIS — D649 Anemia, unspecified: Secondary | ICD-10-CM | POA: Diagnosis present

## 2019-03-04 DIAGNOSIS — D6959 Other secondary thrombocytopenia: Secondary | ICD-10-CM | POA: Diagnosis present

## 2019-03-04 DIAGNOSIS — R52 Pain, unspecified: Secondary | ICD-10-CM

## 2019-03-04 DIAGNOSIS — M109 Gout, unspecified: Secondary | ICD-10-CM | POA: Diagnosis present

## 2019-03-04 DIAGNOSIS — Z800 Family history of malignant neoplasm of digestive organs: Secondary | ICD-10-CM

## 2019-03-04 DIAGNOSIS — I8510 Secondary esophageal varices without bleeding: Secondary | ICD-10-CM | POA: Diagnosis present

## 2019-03-04 DIAGNOSIS — E785 Hyperlipidemia, unspecified: Secondary | ICD-10-CM | POA: Diagnosis present

## 2019-03-04 DIAGNOSIS — M25562 Pain in left knee: Secondary | ICD-10-CM | POA: Diagnosis present

## 2019-03-04 DIAGNOSIS — W1811XA Fall from or off toilet without subsequent striking against object, initial encounter: Secondary | ICD-10-CM | POA: Diagnosis present

## 2019-03-04 DIAGNOSIS — Z825 Family history of asthma and other chronic lower respiratory diseases: Secondary | ICD-10-CM

## 2019-03-04 DIAGNOSIS — E43 Unspecified severe protein-calorie malnutrition: Secondary | ICD-10-CM | POA: Diagnosis present

## 2019-03-04 DIAGNOSIS — Z8249 Family history of ischemic heart disease and other diseases of the circulatory system: Secondary | ICD-10-CM

## 2019-03-04 DIAGNOSIS — B1920 Unspecified viral hepatitis C without hepatic coma: Secondary | ICD-10-CM | POA: Diagnosis present

## 2019-03-04 DIAGNOSIS — I2510 Atherosclerotic heart disease of native coronary artery without angina pectoris: Secondary | ICD-10-CM | POA: Diagnosis present

## 2019-03-04 DIAGNOSIS — Z8261 Family history of arthritis: Secondary | ICD-10-CM

## 2019-03-04 DIAGNOSIS — M25561 Pain in right knee: Secondary | ICD-10-CM | POA: Diagnosis present

## 2019-03-04 DIAGNOSIS — I129 Hypertensive chronic kidney disease with stage 1 through stage 4 chronic kidney disease, or unspecified chronic kidney disease: Secondary | ICD-10-CM | POA: Diagnosis present

## 2019-03-04 DIAGNOSIS — Z6827 Body mass index (BMI) 27.0-27.9, adult: Secondary | ICD-10-CM

## 2019-03-04 DIAGNOSIS — L89626 Pressure-induced deep tissue damage of left heel: Secondary | ICD-10-CM | POA: Diagnosis present

## 2019-03-04 HISTORY — DX: Weakness: R53.1

## 2019-03-04 LAB — URINALYSIS, ROUTINE W REFLEX MICROSCOPIC
Bacteria, UA: NONE SEEN
Bilirubin Urine: NEGATIVE
Glucose, UA: NEGATIVE mg/dL
Hgb urine dipstick: NEGATIVE
Ketones, ur: NEGATIVE mg/dL
Nitrite: NEGATIVE
Protein, ur: NEGATIVE mg/dL
Specific Gravity, Urine: 1.015 (ref 1.005–1.030)
pH: 5 (ref 5.0–8.0)

## 2019-03-04 LAB — COMPREHENSIVE METABOLIC PANEL
ALT: 30 U/L (ref 0–44)
AST: 64 U/L — ABNORMAL HIGH (ref 15–41)
Albumin: 1.8 g/dL — ABNORMAL LOW (ref 3.5–5.0)
Alkaline Phosphatase: 106 U/L (ref 38–126)
Anion gap: 13 (ref 5–15)
BUN: 41 mg/dL — ABNORMAL HIGH (ref 8–23)
CO2: 16 mmol/L — ABNORMAL LOW (ref 22–32)
Calcium: 8.2 mg/dL — ABNORMAL LOW (ref 8.9–10.3)
Chloride: 116 mmol/L — ABNORMAL HIGH (ref 98–111)
Creatinine, Ser: 2.12 mg/dL — ABNORMAL HIGH (ref 0.61–1.24)
GFR calc Af Amer: 35 mL/min — ABNORMAL LOW (ref >60)
GFR calc non Af Amer: 30 mL/min — ABNORMAL LOW (ref >60)
Glucose, Bld: 88 mg/dL (ref 70–99)
Potassium: 4.4 mmol/L (ref 3.5–5.1)
Sodium: 145 mmol/L (ref 135–145)
Total Bilirubin: 1.9 mg/dL — ABNORMAL HIGH (ref 0.3–1.2)
Total Protein: 6.6 g/dL (ref 6.5–8.1)

## 2019-03-04 LAB — TROPONIN I (HIGH SENSITIVITY)
Troponin I (High Sensitivity): 12 ng/L (ref <18)
Troponin I (High Sensitivity): 13 ng/L (ref <18)

## 2019-03-04 LAB — PREPARE RBC (CROSSMATCH)

## 2019-03-04 LAB — CBC WITH DIFFERENTIAL/PLATELET
Abs Immature Granulocytes: 0.06 10*3/uL (ref 0.00–0.07)
Basophils Absolute: 0 10*3/uL (ref 0.0–0.1)
Basophils Relative: 0 %
Eosinophils Absolute: 0.1 10*3/uL (ref 0.0–0.5)
Eosinophils Relative: 1 %
HCT: 18.8 % — ABNORMAL LOW (ref 39.0–52.0)
Hemoglobin: 6.4 g/dL — CL (ref 13.0–17.0)
Immature Granulocytes: 1 %
Lymphocytes Relative: 26 %
Lymphs Abs: 2.7 10*3/uL (ref 0.7–4.0)
MCH: 28.1 pg (ref 26.0–34.0)
MCHC: 34 g/dL (ref 30.0–36.0)
MCV: 82.5 fL (ref 80.0–100.0)
Monocytes Absolute: 1.3 10*3/uL — ABNORMAL HIGH (ref 0.1–1.0)
Monocytes Relative: 13 %
Neutro Abs: 6.1 10*3/uL (ref 1.7–7.7)
Neutrophils Relative %: 59 %
Platelets: 134 10*3/uL — ABNORMAL LOW (ref 150–400)
RBC: 2.28 MIL/uL — ABNORMAL LOW (ref 4.22–5.81)
RDW: 16.5 % — ABNORMAL HIGH (ref 11.5–15.5)
WBC: 10.3 10*3/uL (ref 4.0–10.5)
nRBC: 0 % (ref 0.0–0.2)

## 2019-03-04 LAB — CK: Total CK: 316 U/L (ref 49–397)

## 2019-03-04 LAB — POC OCCULT BLOOD, ED: Fecal Occult Bld: NEGATIVE

## 2019-03-04 LAB — ABO/RH: ABO/RH(D): O POS

## 2019-03-04 LAB — LIPASE, BLOOD: Lipase: 36 U/L (ref 11–51)

## 2019-03-04 MED ORDER — SODIUM CHLORIDE 0.9 % IV SOLN
10.0000 mL/h | Freq: Once | INTRAVENOUS | Status: AC
  Administered 2019-03-04: 20:00:00 10 mL/h via INTRAVENOUS

## 2019-03-04 MED ORDER — LEVOTHYROXINE SODIUM 50 MCG PO TABS
50.0000 ug | ORAL_TABLET | Freq: Every day | ORAL | Status: DC
  Administered 2019-03-05 – 2019-03-12 (×8): 50 ug via ORAL
  Filled 2019-03-04 (×8): qty 1

## 2019-03-04 MED ORDER — CARVEDILOL 3.125 MG PO TABS
3.1250 mg | ORAL_TABLET | Freq: Two times a day (BID) | ORAL | Status: DC
  Administered 2019-03-05 – 2019-03-12 (×15): 3.125 mg via ORAL
  Filled 2019-03-04 (×16): qty 1

## 2019-03-04 MED ORDER — LIP MEDEX EX OINT
1.0000 "application " | TOPICAL_OINTMENT | CUTANEOUS | Status: DC | PRN
  Filled 2019-03-04: qty 7

## 2019-03-04 MED ORDER — ACETAMINOPHEN 650 MG RE SUPP: 650.0000 mg | Freq: Four times a day (QID) | RECTAL | Status: DC | PRN

## 2019-03-04 MED ORDER — OXYCODONE HCL 5 MG PO TABS
5.0000 mg | ORAL_TABLET | Freq: Four times a day (QID) | ORAL | Status: DC | PRN
  Administered 2019-03-04 – 2019-03-12 (×14): 5 mg via ORAL
  Filled 2019-03-04 (×14): qty 1

## 2019-03-04 MED ORDER — ACETAMINOPHEN 325 MG PO TABS: 650.0000 mg | ORAL_TABLET | Freq: Four times a day (QID) | ORAL | Status: DC | PRN

## 2019-03-04 MED ORDER — SODIUM BICARBONATE 650 MG PO TABS
650.0000 mg | ORAL_TABLET | Freq: Every day | ORAL | Status: DC
  Administered 2019-03-04 – 2019-03-12 (×9): 650 mg via ORAL
  Filled 2019-03-04 (×9): qty 1

## 2019-03-04 MED ORDER — FOLIC ACID 1 MG PO TABS
1.0000 mg | ORAL_TABLET | Freq: Every day | ORAL | Status: DC
  Administered 2019-03-05 – 2019-03-12 (×8): 1 mg via ORAL
  Filled 2019-03-04 (×8): qty 1

## 2019-03-04 MED ORDER — HYDRALAZINE HCL 20 MG/ML IJ SOLN
10.0000 mg | INTRAMUSCULAR | Status: DC | PRN
  Administered 2019-03-06: 20:00:00 20 mg via INTRAVENOUS
  Administered 2019-03-06: 08:00:00 10 mg via INTRAVENOUS
  Administered 2019-03-06: 15:00:00 20 mg via INTRAVENOUS
  Filled 2019-03-04 (×2): qty 1

## 2019-03-04 MED ORDER — LACTULOSE 10 GM/15ML PO SOLN
20.0000 g | Freq: Two times a day (BID) | ORAL | Status: DC
  Administered 2019-03-08 – 2019-03-11 (×3): 20 g via ORAL
  Filled 2019-03-04 (×11): qty 30

## 2019-03-04 MED ORDER — ONDANSETRON HCL 4 MG PO TABS: 4.0000 mg | ORAL_TABLET | Freq: Four times a day (QID) | ORAL | Status: DC | PRN

## 2019-03-04 MED ORDER — ONDANSETRON HCL 4 MG/2ML IJ SOLN: 4.0000 mg | Freq: Four times a day (QID) | INTRAMUSCULAR | Status: DC | PRN

## 2019-03-04 MED ORDER — PANTOPRAZOLE SODIUM 40 MG PO TBEC
40.0000 mg | DELAYED_RELEASE_TABLET | Freq: Two times a day (BID) | ORAL | Status: DC
  Administered 2019-03-05 – 2019-03-12 (×15): 40 mg via ORAL
  Filled 2019-03-04 (×15): qty 1

## 2019-03-04 MED ORDER — CLONIDINE HCL 0.3 MG/24HR TD PTWK: 0.3000 mg | MEDICATED_PATCH | TRANSDERMAL | Status: DC

## 2019-03-04 MED ORDER — SERTRALINE HCL 25 MG PO TABS
25.0000 mg | ORAL_TABLET | Freq: Every day | ORAL | Status: DC
  Administered 2019-03-05 – 2019-03-12 (×8): 25 mg via ORAL
  Filled 2019-03-04 (×8): qty 1

## 2019-03-04 MED ORDER — SODIUM CHLORIDE 0.9 % IV BOLUS
500.0000 mL | Freq: Once | INTRAVENOUS | Status: AC
  Administered 2019-03-04: 17:00:00 500 mL via INTRAVENOUS
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
## 2019-03-04 NOTE — Progress Notes (Signed)
Received pt from ED @approx . 2200. Paged admitting MD, Dr. Alcario Drought, @2223  regarding pt's SBPs (180s-190s), requests for pain medication for generalized body ache, and pt's endorsement of thickened liquid diet prior to admission. Paged promptly returned and orders received (see chart). Will implement orders and continue to monitor.
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
## 2019-03-04 NOTE — ED Triage Notes (Signed)
Pt arrived via [facility] from home. Pt was recently D/C'd from [facility] and was supposed to have home health set up for him, but has not had consistent home health workers coming to home. Pt fell last night was able to make it into bed where he layed in his own feces all night. Pt is A&Ox4.

## 2019-03-04 NOTE — Progress Notes (Signed)
@  2327 M. Sharlet [place] of [facility] paged regarding pt's desire to be a full code. This RN went to place DNR bracelet on pt per order and pt endorsed he would want to be resuscitated with full measures if something happened including but not limited to intubation, CPR, and IV medications.

## 2019-03-04 NOTE — ED Notes (Signed)
Pt requested soiled clothes to be thrown away. Pt has his wallet.
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
## 2019-03-05 ENCOUNTER — Encounter (HOSPITAL_COMMUNITY): Payer: Self-pay

## 2019-03-05 ENCOUNTER — Ambulatory Visit (HOSPITAL_COMMUNITY): Admission: RE | Admit: 2019-03-05 | Payer: Medicare Other | Source: Ambulatory Visit

## 2019-03-05 ENCOUNTER — Encounter (HOSPITAL_COMMUNITY): Payer: Self-pay | Admitting: Internal Medicine

## 2019-03-05 ENCOUNTER — Encounter (HOSPITAL_COMMUNITY): Payer: Medicare Other

## 2019-03-05 DIAGNOSIS — D649 Anemia, unspecified: Secondary | ICD-10-CM | POA: Diagnosis not present

## 2019-03-05 LAB — TYPE AND SCREEN
ABO/RH(D): O POS
Antibody Screen: NEGATIVE
Unit division: 0
Unit division: 0

## 2019-03-05 LAB — CBC
HCT: 34 % — ABNORMAL LOW (ref 39.0–52.0)
HCT: 33.4 % — ABNORMAL LOW (ref 39.0–52.0)
Hemoglobin: 12 g/dL — ABNORMAL LOW (ref 13.0–17.0)
Hemoglobin: 11.6 g/dL — ABNORMAL LOW (ref 13.0–17.0)
MCH: 28.6 pg (ref 26.0–34.0)
MCH: 28.7 pg (ref 26.0–34.0)
MCHC: 35.3 g/dL (ref 30.0–36.0)
MCHC: 34.7 g/dL (ref 30.0–36.0)
MCV: 81 fL (ref 80.0–100.0)
MCV: 82.7 fL (ref 80.0–100.0)
Platelets: 84 10*3/uL — ABNORMAL LOW (ref 150–400)
Platelets: 92 10*3/uL — ABNORMAL LOW (ref 150–400)
RBC: 4.2 MIL/uL — ABNORMAL LOW (ref 4.22–5.81)
RBC: 4.04 MIL/uL — ABNORMAL LOW (ref 4.22–5.81)
RDW: 15.5 % (ref 11.5–15.5)
RDW: 15.9 % — ABNORMAL HIGH (ref 11.5–15.5)
WBC: 8.1 10*3/uL (ref 4.0–10.5)
WBC: 6.3 10*3/uL (ref 4.0–10.5)
nRBC: 0 % (ref 0.0–0.2)
nRBC: 0 % (ref 0.0–0.2)

## 2019-03-05 LAB — URINE CULTURE: Culture: <10000 — AB

## 2019-03-05 LAB — BASIC METABOLIC PANEL
Anion gap: 10 (ref 5–15)
BUN: 40 mg/dL — ABNORMAL HIGH (ref 8–23)
CO2: 15 mmol/L — ABNORMAL LOW (ref 22–32)
Calcium: 8.1 mg/dL — ABNORMAL LOW (ref 8.9–10.3)
Chloride: 122 mmol/L — ABNORMAL HIGH (ref 98–111)
Creatinine, Ser: 1.92 mg/dL — ABNORMAL HIGH (ref 0.61–1.24)
GFR calc Af Amer: 40 mL/min — ABNORMAL LOW (ref >60)
GFR calc non Af Amer: 34 mL/min — ABNORMAL LOW (ref >60)
Glucose, Bld: 98 mg/dL (ref 70–99)
Potassium: 4.6 mmol/L (ref 3.5–5.1)
Sodium: 147 mmol/L — ABNORMAL HIGH (ref 135–145)

## 2019-03-05 LAB — BPAM RBC
Blood Product Expiration Date: 202102282359
Blood Product Expiration Date: 202102282359
ISSUE DATE / TIME: 202102021952
ISSUE DATE / TIME: 202102022243
Unit Type and Rh: 5100
Unit Type and Rh: 5100

## 2019-03-05 LAB — LACTIC ACID, PLASMA
Lactic Acid, Venous: 2.9 mmol/L (ref 0.5–1.9)
Lactic Acid, Venous: 2 mmol/L (ref 0.5–1.9)

## 2019-03-05 LAB — SARS CORONAVIRUS 2 (TAT 6-24 HRS): SARS Coronavirus 2: NEGATIVE

## 2019-03-05 MED ORDER — SODIUM CHLORIDE 0.9 % IV SOLN: INTRAVENOUS | Status: DC

## 2019-03-05 MED ORDER — PNEUMOCOCCAL VAC POLYVALENT 25 MCG/0.5ML IJ INJ
0.5000 mL | INJECTION | INTRAMUSCULAR | Status: AC
  Administered 2019-03-06: 11:00:00 0.5 mL via INTRAMUSCULAR
  Filled 2019-03-05: qty 0.5
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
## 2019-03-05 NOTE — Progress Notes (Signed)
CRITICAL VALUE ALERT  Critical Value:  Lactic acid 2.9  Date & Time Notied:  03/05/19; 1686  Provider Notified: Dr. Doristine Bosworth  Orders Received/Actions taken: New orders as appropriate

## 2019-03-05 NOTE — Progress Notes (Signed)
PROGRESS NOTE    Bruce Hayes  UJW:[id number] DOB: 01-31-1946 DOA: 03/04/2019 PCP: Lorene Dy, MD   Brief Narrative:  HPI: Bruce Hayes is a 74 y.o. 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  Patient presents with generalized weakness and falls at home.  Patient had fall off the commode yesterday evening. He was able to ambulate to bed however was found by family today covered in feces. He was too weak to ambulate. Was recently admitted to hospitalist for AKI, hyperkalemia and sepsis. Patient was discharged to SNF however subsequently discharged from SNF.  He lives alone at home currently (not working very well).  No fever, chills, nausea, vomiting, chest pain, shortness of breath, abdominal pain, diarrhea, dysuria.  No melena nor hematochezia.  Collateral information from patient sisterCarrier Hayes.States patient was discharged [facility] facility on Friday. Since then patient has had multiple falls. EMS has been called out 3 or 4 times to get him up off the floor. Sister states patient was found in his bed soiled earlier today. According to sister he has been there since Sunday. Patient not able to take care of himself or perform his ADLs. States his weakness has significantly worsened over the last 3 days. Sister states patient no longer drinking alcohol or any substance use.   ED Course: HGB 6.4 down from 8.5 in Jan.  Hemoccult negative.  Platelets 134 (up from 85 in Jan).  Creat 2.1 and BUN 41, up slightly from discharge in Jan (1.8 and 37).  2u PRBC transfusion ordered.  Assessment & Plan:   Principal Problem:   Symptomatic anemia Active Problems:   Essential hypertension   Esophageal varices (HCC)   CKD (chronic kidney disease), stage III   Other cirrhosis of liver (HCC)   Metabolic acidosis, NAG, failure of bicarbonate  regeneration   Symptomatic anemia/?  Acute blood loss anemia/history of esophageal varices and portal hypertensive gastropathy due to liver cirrhosis: Presenting hemoglobin 6.4.  Received 2 units of blood transfusion.  Posttransfusion hemoglobin 12.0.  FOBT negative.  With significant jump of hemoglobin after just 2 units of PRBC transfusion, raises suspicion for reliability of lab error.  Due to history of portal gastropathy, he may be losing blood from GI bleed.  We will check another FOBT.  Monitor H&H every 12 hours.  If further drop, consider consulting GI.  Continue PPI.  Essential hypertension: Fairly controlled with some elevation here and there.  We will continue to monitor on current home medications which is carvedilol and clonidine patch.  Continue as needed hydralazine.  CKD stage IIIb: At baseline.  Continue to watch.  Non-anion gap metabolic acidosis: ?  Could be due to dehydration.  Check lactic acid.  Start on gentle hydration.  History of liver cirrhosis: Continue lactulose.  Generalized weakness/frequent falls: Patient was discharged about 10 days ago from [facility].  He still feels significantly weak and reportedly has had several falls since discharge.  He is not happy with the care that he received and although he is willing to go to SNF if needed but does not want to go to [facility].  Will consult PT OT to assess him.  Right buttock pressure injury/left buttock stage II pressure ulcer/left heel deep tissue injury, present on admission: Wound care on board  DVT prophylaxis: SCD Code Status: Full code Family Communication:  None present at bedside.  Plan of care  discussed with patient in length and he verbalized understanding and agreed with it. Patient is from: Home Disposition Plan: Potentially SNF Barriers to discharge: Pending placement and evaluation by therapy   Estimated body mass index is 23.8 kg/m as calculated from the following:   Height as of  this encounter: 5\' 11"  (1.803 m).   Weight as of this encounter: 77.4 kg.  Pressure Injury 12/29/18 Buttocks Right;Left Stage II -  Partial thickness loss of dermis presenting as a shallow open ulcer with a red, pink wound bed without slough. (Active)  12/29/18 2330  Location: Buttocks  Location Orientation: Right;Left  Staging: Stage II -  Partial thickness loss of dermis presenting as a shallow open ulcer with a red, pink wound bed without slough.  Wound Description (Comments):   Present on Admission: Yes     Pressure Injury 01/25/19 Heel Left Deep Tissue Pressure Injury - Purple or maroon localized area of discolored intact skin or blood-filled blister due to damage of underlying soft tissue from pressure and/or shear. purple, quarter-sized discoloration (Active)  01/25/19 1600  Location: Heel  Location Orientation: Left  Staging: Deep Tissue Pressure Injury - Purple or maroon localized area of discolored intact skin or blood-filled blister due to damage of underlying soft tissue from pressure and/or shear.  Wound Description (Comments): purple, quarter-sized discoloration  Present on Admission: Yes     Nutritional status:               Consultants:   None  Procedures:   None  Antimicrobials:   None   Subjective: Seen and examined.  Feels better.  Still feels weak.  Objective: Vitals:   03/05/19 0300 03/05/19 0400 03/05/19 0600 03/05/19 0800  BP: 138/60 (!) 142/60 (!) 173/68 (!) 155/64  Pulse: (!) 58 (!) 57 (!) 56 (!) 59  Resp: 13 14 15 20   Temp: 97.9 F (36.6 C)   (!) 97.5 F (36.4 C)  TempSrc: Oral   Oral  SpO2: 98% 99% 98% 98%  Weight:  77.4 kg    Height:        Intake/Output Summary (Last 24 hours) at 03/05/2019 1157 Last data filed at 03/05/2019 0500 Gross per 24 hour  Intake 1180 ml  Output 200 ml  Net 980 ml   Filed Weights   03/04/19 2212 03/05/19 0400  Weight: 76.5 kg 77.4 kg    Examination:  General exam: Appears calm and  comfortable  Respiratory system: Clear to auscultation. Respiratory effort normal. Cardiovascular system: S1 & S2 heard, RRR. No JVD, murmurs, rubs, gallops or clicks. No pedal edema. Gastrointestinal system: Abdomen is nondistended, soft and nontender. No organomegaly or masses felt. Normal bowel sounds heard. Central nervous system: Alert and oriented. No focal neurological deficits. Extremities: Symmetric 5 x 5 power. Psychiatry: Judgement and insight appear normal. Mood & affect appropriate.    Data Reviewed: I have personally reviewed following labs and imaging studies  CBC: Recent Labs  Lab 03/04/19 1700 03/05/19 0330  WBC 10.3 8.1  NEUTROABS 6.1  --   HGB 6.4* 12.0*  HCT 18.8* 34.0*  MCV 82.5 81.0  PLT 134* 84*   Basic Metabolic Panel: Recent Labs  Lab 03/04/19 1700 03/05/19 0330  NA 145 147*  K 4.4 4.6  CL 116* 122*  CO2 16* 15*  GLUCOSE 88 98  BUN 41* 40*  CREATININE 2.12* 1.92*  CALCIUM 8.2* 8.1*   GFR: Estimated Creatinine Clearance: 37.6 mL/min (A) (by C-G formula based on SCr of 1.92 mg/dL (H)).  Liver Function Tests: Recent Labs  Lab 03/04/19 1700  AST 64*  ALT 30  ALKPHOS 106  BILITOT 1.9*  PROT 6.6  ALBUMIN 1.8*   Recent Labs  Lab 03/04/19 1700  LIPASE 36   No results for input(s): AMMONIA in the last 168 hours. Coagulation Profile: No results for input(s): INR, PROTIME in the last 168 hours. Cardiac Enzymes: Recent Labs  Lab 03/04/19 1700  CKTOTAL 316   BNP (last 3 results) No results for input(s): PROBNP in the last 8760 hours. HbA1C: No results for input(s): HGBA1C in the last 72 hours. CBG: No results for input(s): GLUCAP in the last 168 hours. Lipid Profile: No results for input(s): CHOL, HDL, LDLCALC, TRIG, CHOLHDL, LDLDIRECT in the last 72 hours. Thyroid Function Tests: No results for input(s): TSH, T4TOTAL, FREET4, T3FREE, THYROIDAB in the last 72 hours. Anemia Panel: No results for input(s): VITAMINB12, FOLATE,  FERRITIN, TIBC, IRON, RETICCTPCT in the last 72 hours. Sepsis Labs: No results for input(s): PROCALCITON, LATICACIDVEN in the last 168 hours.  Recent Results (from the past 240 hour(s))  SARS CORONAVIRUS 2 (TAT 6-24 HRS) Nasopharyngeal Nasopharyngeal Swab     Status: None   Collection Time: 03/04/19  7:21 PM   Specimen: Nasopharyngeal Swab  Result Value Ref Range Status   SARS Coronavirus 2 NEGATIVE NEGATIVE Final    Comment: (NOTE) SARS-CoV-2 target nucleic acids are NOT DETECTED. The SARS-CoV-2 RNA is generally detectable in upper and lower respiratory specimens during the acute phase of infection. Negative results do not preclude SARS-CoV-2 infection, do not rule out co-infections with other pathogens, and should not be used as the sole basis for treatment or other patient management decisions. Negative results must be combined with clinical observations, patient history, and epidemiological information. The expected result is Negative. Fact Sheet for Patients: SugarRoll.be Fact Sheet for Healthcare Providers: https://www.woods-mathews.com/ This test is not yet approved or cleared by the [place] FDA and  has been authorized for detection and/or diagnosis of SARS-CoV-2 by FDA under an Emergency Use Authorization (EUA). This EUA will remain  in effect (meaning this test can be used) for the duration of the COVID-19 declaration under Section 56 4(b)(1) of the Act, 21 U.S.C. section 360bbb-3(b)(1), unless the authorization is terminated or revoked sooner. Performed at [facility], [place] [address], [place], [place] [address]       Radiology Studies: DG Chest 2 View  Result Date: 03/04/2019 CLINICAL DATA:  Status post fall. EXAM: CHEST - 2 VIEW COMPARISON:  January 24, 2019 FINDINGS: There is no evidence of acute infiltrate, pleural effusion or pneumothorax. The heart size and mediastinal contours are within normal limits. There  is mild calcification of the aortic arch. Multilevel degenerative changes seen throughout the thoracic spine. IMPRESSION: No active cardiopulmonary disease. Electronically Signed   By: Virgina Norfolk M.D.   On: 03/04/2019 15:24   DG Lumbar Spine Complete  Result Date: 03/04/2019 CLINICAL DATA:  Status post fall. EXAM: LUMBAR SPINE - COMPLETE 4+ VIEW COMPARISON:  None. FINDINGS: There is no evidence of lumbar spine fracture. Alignment is normal. Mild multilevel endplate sclerosis seen throughout the lumbar spine. Mild associated anterior osteophyte formation is seen at the levels of L1-L2 and L2-L3. Mild intervertebral disc space narrowing is seen at the levels of L1-L2 and L5-S1. There is marked severity calcification of the abdominal aorta. IMPRESSION: 1. Multilevel degenerative changes without evidence of acute osseous abnormality. Electronically Signed   By: Virgina Norfolk M.D.   On: 03/04/2019  15:35   CT Head Wo Contrast  Result Date: 03/04/2019 CLINICAL DATA:  Status post fall. EXAM: CT HEAD WITHOUT CONTRAST TECHNIQUE: Contiguous axial images were obtained from the base of the skull through the vertex without intravenous contrast. COMPARISON:  December 30, 2018 FINDINGS: Brain: There is mild cerebral atrophy with widening of the extra-axial spaces and ventricular dilatation. There are areas of decreased attenuation within the white matter tracts of the supratentorial brain, consistent with microvascular disease changes. Vascular: No hyperdense vessel or unexpected calcification. Skull: Normal. Negative for fracture or focal lesion. Sinuses/Orbits: No acute finding. Other: A small metallic density foreign body is seen adjacent to the anterior aspect of the right zygomatic arch. This is present on the prior study. IMPRESSION: No acute intracranial pathology. Electronically Signed   By: Virgina Norfolk M.D.   On: 03/04/2019 15:58   CT Cervical Spine Wo Contrast  Result Date: 03/04/2019 CLINICAL  DATA:  Status post fall. EXAM: CT CERVICAL SPINE WITHOUT CONTRAST TECHNIQUE: Multidetector CT imaging of the cervical spine was performed without intravenous contrast. Multiplanar CT image reconstructions were also generated. COMPARISON:  None. FINDINGS: Alignment: Normal. Skull base and vertebrae: No acute fracture. No primary bone lesion or focal pathologic process. Soft tissues and spinal canal: No prevertebral fluid or swelling. No visible canal hematoma. Disc levels: C2-3: There is very mild end plate spondylosis. Mild disc space narrowing is seen. Bilateral facet hypertrophy is noted. Normal central canal and intervertebral neuroforamina. C3-4: There is very mild end plate spondylosis. Mild disc space narrowing is seen. Bilateral facet hypertrophy is noted. Normal central canal and intervertebral neuroforamina. C4-5: There is mild end plate spondylosis. Mild disc space narrowing is seen. Bilateral facet hypertrophy is noted. Normal central canal and intervertebral neuroforamina. C5-6: There is very mild end plate spondylosis. Mild disc space narrowing is seen. Bilateral facet hypertrophy is noted. Normal central canal and intervertebral neuroforamina. C6-7: There is mild to moderate severity end plate spondylosis. Mild to moderate severity disc space narrowing is seen. Bilateral facet hypertrophy is noted. Normal central canal and intervertebral neuroforamina. C7-T1: There is very mild end plate spondylosis. Moderate severity disc space narrowing is seen. Bilateral facet hypertrophy is noted. Normal central canal and intervertebral neuroforamina. Upper chest: Mild scarring and/or atelectasis is seen within the left apex. Other: None. IMPRESSION: 1. Multilevel degenerative disc disease and facet hypertrophy, without central canal or intervertebral foraminal narrowing. 2. No fracture seen. Electronically Signed   By: Virgina Norfolk M.D.   On: 03/04/2019 16:08   CT PELVIS WO CONTRAST  Result Date:  03/04/2019 CLINICAL DATA:  Status post fall. EXAM: CT PELVIS WITHOUT CONTRAST TECHNIQUE: Multidetector CT imaging of the pelvis was performed following the standard protocol without intravenous contrast. COMPARISON:  None. FINDINGS: Urinary Tract: No abnormality visualized. The urinary bladder is normal in appearance. Bowel:  Unremarkable visualized pelvic bowel loops. Vascular/Lymphatic: There is marked severity calcification of the visualized portion of the abdominal aorta and bilateral common iliac arteries. Reproductive: The prostate gland is normal in size. Moderate severity prostate calcification is seen. Other:  None. Musculoskeletal: A metallic density foreign body is seen within the soft tissues along the posterior aspect of the right inferior pubic ramus. Multilevel degenerative changes seen within the visualized portions of the lumbar spine. No acute osseous abnormalities are identified. IMPRESSION: 1. No evidence of acute fracture or dislocation. Electronically Signed   By: Virgina Norfolk M.D.   On: 03/04/2019 16:13   DG Hips Bilat W or Wo Pelvis 5 Views  Result Date: 03/04/2019 CLINICAL DATA:  Status post fall. EXAM: DG HIP (WITH OR WITHOUT PELVIS) 5+V BILAT COMPARISON:  July 23, 2018 FINDINGS: A small curvilinear cortical defect is seen along the left inferior pubic ramus. There is no evidence of dislocation. Moderate severity degenerative changes are seen within the visualized portion of the lower lumbar spine. Radiopaque BBs are seen overlying the soft tissues inferior of the proximal right lower extremity. IMPRESSION: 1. Cortical lucency involving the left inferior pubic ramus which may represent a small nondisplaced fracture. CT correlation is recommended. Electronically Signed   By: Virgina Norfolk M.D.   On: 03/04/2019 15:28    Scheduled Meds: . carvedilol  3.125 mg Oral BID WC  . [START ON 03/07/2019] cloNIDine  0.3 mg Transdermal Weekly  . folic acid  1 mg Oral Daily  . lactulose   20 g Oral BID  . levothyroxine  50 mcg Oral QAC breakfast  . pantoprazole  40 mg Oral BID  . [START ON 03/06/2019] pneumococcal 23 valent vaccine  0.5 mL Intramuscular Tomorrow-1000  . sertraline  25 mg Oral Daily  . sodium bicarbonate  650 mg Oral Daily   Continuous Infusions:   LOS: 0 days   Time spent: 35 minutes   Darliss Cheney, MD [facility]  03/05/2019, 11:57 AM   To contact the attending provider between 7A-7P or the covering provider during after hours 7P-7A, please log into the web site [email or web address].

## 2019-03-06 ENCOUNTER — Observation Stay (HOSPITAL_COMMUNITY): Payer: Medicare Other

## 2019-03-06 DIAGNOSIS — K3189 Other diseases of stomach and duodenum: Secondary | ICD-10-CM | POA: Diagnosis present

## 2019-03-06 DIAGNOSIS — R1310 Dysphagia, unspecified: Secondary | ICD-10-CM | POA: Diagnosis present

## 2019-03-06 DIAGNOSIS — E039 Hypothyroidism, unspecified: Secondary | ICD-10-CM | POA: Diagnosis present

## 2019-03-06 DIAGNOSIS — L89626 Pressure-induced deep tissue damage of left heel: Secondary | ICD-10-CM | POA: Diagnosis present

## 2019-03-06 DIAGNOSIS — D62 Acute posthemorrhagic anemia: Secondary | ICD-10-CM | POA: Diagnosis present

## 2019-03-06 DIAGNOSIS — Z66 Do not resuscitate: Secondary | ICD-10-CM | POA: Diagnosis present

## 2019-03-06 DIAGNOSIS — Z20822 Contact with and (suspected) exposure to covid-19: Secondary | ICD-10-CM | POA: Diagnosis present

## 2019-03-06 DIAGNOSIS — E872 Acidosis: Secondary | ICD-10-CM | POA: Diagnosis present

## 2019-03-06 DIAGNOSIS — W1811XA Fall from or off toilet without subsequent striking against object, initial encounter: Secondary | ICD-10-CM | POA: Diagnosis present

## 2019-03-06 DIAGNOSIS — E43 Unspecified severe protein-calorie malnutrition: Secondary | ICD-10-CM | POA: Diagnosis present

## 2019-03-06 DIAGNOSIS — D6959 Other secondary thrombocytopenia: Secondary | ICD-10-CM | POA: Diagnosis present

## 2019-03-06 DIAGNOSIS — K7030 Alcoholic cirrhosis of liver without ascites: Secondary | ICD-10-CM | POA: Diagnosis present

## 2019-03-06 DIAGNOSIS — R296 Repeated falls: Secondary | ICD-10-CM | POA: Diagnosis present

## 2019-03-06 DIAGNOSIS — D649 Anemia, unspecified: Secondary | ICD-10-CM | POA: Diagnosis not present

## 2019-03-06 DIAGNOSIS — I8500 Esophageal varices without bleeding: Secondary | ICD-10-CM | POA: Diagnosis not present

## 2019-03-06 DIAGNOSIS — Z23 Encounter for immunization: Secondary | ICD-10-CM | POA: Diagnosis not present

## 2019-03-06 DIAGNOSIS — Z6827 Body mass index (BMI) 27.0-27.9, adult: Secondary | ICD-10-CM | POA: Diagnosis not present

## 2019-03-06 DIAGNOSIS — M25562 Pain in left knee: Secondary | ICD-10-CM | POA: Diagnosis present

## 2019-03-06 DIAGNOSIS — L89322 Pressure ulcer of left buttock, stage 2: Secondary | ICD-10-CM | POA: Diagnosis present

## 2019-03-06 DIAGNOSIS — Z8619 Personal history of other infectious and parasitic diseases: Secondary | ICD-10-CM | POA: Diagnosis not present

## 2019-03-06 DIAGNOSIS — I8510 Secondary esophageal varices without bleeding: Secondary | ICD-10-CM | POA: Diagnosis present

## 2019-03-06 DIAGNOSIS — R531 Weakness: Secondary | ICD-10-CM | POA: Diagnosis present

## 2019-03-06 DIAGNOSIS — B1920 Unspecified viral hepatitis C without hepatic coma: Secondary | ICD-10-CM | POA: Diagnosis present

## 2019-03-06 DIAGNOSIS — I10 Essential (primary) hypertension: Secondary | ICD-10-CM | POA: Diagnosis not present

## 2019-03-06 DIAGNOSIS — I129 Hypertensive chronic kidney disease with stage 1 through stage 4 chronic kidney disease, or unspecified chronic kidney disease: Secondary | ICD-10-CM | POA: Diagnosis present

## 2019-03-06 DIAGNOSIS — K766 Portal hypertension: Secondary | ICD-10-CM | POA: Diagnosis present

## 2019-03-06 DIAGNOSIS — L89312 Pressure ulcer of right buttock, stage 2: Secondary | ICD-10-CM | POA: Diagnosis present

## 2019-03-06 DIAGNOSIS — N1832 Chronic kidney disease, stage 3b: Secondary | ICD-10-CM | POA: Diagnosis present

## 2019-03-06 DIAGNOSIS — R42 Dizziness and giddiness: Secondary | ICD-10-CM | POA: Diagnosis not present

## 2019-03-06 DIAGNOSIS — K769 Liver disease, unspecified: Secondary | ICD-10-CM | POA: Diagnosis present

## 2019-03-06 LAB — BASIC METABOLIC PANEL
Anion gap: 10 (ref 5–15)
BUN: 34 mg/dL — ABNORMAL HIGH (ref 8–23)
CO2: 17 mmol/L — ABNORMAL LOW (ref 22–32)
Calcium: 8 mg/dL — ABNORMAL LOW (ref 8.9–10.3)
Chloride: 116 mmol/L — ABNORMAL HIGH (ref 98–111)
Creatinine, Ser: 1.9 mg/dL — ABNORMAL HIGH (ref 0.61–1.24)
GFR calc Af Amer: 40 mL/min — ABNORMAL LOW (ref >60)
GFR calc non Af Amer: 35 mL/min — ABNORMAL LOW (ref >60)
Glucose, Bld: 97 mg/dL (ref 70–99)
Potassium: 4.3 mmol/L (ref 3.5–5.1)
Sodium: 143 mmol/L (ref 135–145)

## 2019-03-06 LAB — CBC
HCT: 33.5 % — ABNORMAL LOW (ref 39.0–52.0)
Hemoglobin: 11.7 g/dL — ABNORMAL LOW (ref 13.0–17.0)
MCH: 28.5 pg (ref 26.0–34.0)
MCHC: 34.9 g/dL (ref 30.0–36.0)
MCV: 81.5 fL (ref 80.0–100.0)
Platelets: 73 10*3/uL — ABNORMAL LOW (ref 150–400)
RBC: 4.11 MIL/uL — ABNORMAL LOW (ref 4.22–5.81)
RDW: 16.1 % — ABNORMAL HIGH (ref 11.5–15.5)
WBC: 7 10*3/uL (ref 4.0–10.5)
nRBC: 0 % (ref 0.0–0.2)

## 2019-03-06 MED ORDER — CARVEDILOL 3.125 MG PO TABS: 3.1250 mg | ORAL_TABLET | Freq: Two times a day (BID) | ORAL | Status: DC

## 2019-03-06 MED ORDER — AMLODIPINE BESYLATE 5 MG PO TABS
5.0000 mg | ORAL_TABLET | Freq: Every day | ORAL | Status: DC
  Administered 2019-03-06 – 2019-03-12 (×7): 5 mg via ORAL
  Filled 2019-03-06 (×8): qty 1

## 2019-03-06 MED ORDER — FOLIC ACID 1 MG PO TABS: 1.0000 mg | ORAL_TABLET | Freq: Every day | ORAL | Status: DC

## 2019-03-06 MED ORDER — CLONIDINE HCL 0.3 MG/24HR TD PTWK
0.3000 mg | MEDICATED_PATCH | TRANSDERMAL | Status: DC
  Administered 2019-03-06: 11:00:00 0.3 mg via TRANSDERMAL
  Filled 2019-03-06: qty 1

## 2019-03-06 NOTE — Progress Notes (Signed)
Modified Barium Swallow Progress Note  Patient Details  Name: Bruce Hayes MRN: [id number] Date of Birth: 1945-11-25  Today's Date: 03/06/2019  Modified Barium Swallow completed.  Full report located under Chart Review in the Imaging Section.  Brief recommendations include the following:  Clinical Impression  Pt was seen for a modified barium swallow study and he presents with mild oropharyngeal dysphagia.  He consumed trials of thin liquid, nectar-thick liquid, puree, and soft solids.  Pt is edentulous, which is a contributing factor to oral dysphagia.  Generalized oropharyngeal weakness was observed in addition to reduced hyolaryngeal excursion and reduced pharyngeal constriction.  This resulted in mild-moderate vallecular and pyriform residue.  Trace residue was additionally observed along the posterior pharyngeal wall and below the UES.  Swallow initiation was delayed at the level of the pyriform sinuses with liquids/puree and at the level of the valleculae with soft solids, but pt exhibited good airway protection via laryngeal closure and no laryngeal penetration or aspiration was observed with any trials.  However, pt is at an increased risk for aspiration secondary to pharyngeal residue.  Therefore, recommend Dysphagia 2 (fine chop) solids and thin liquids with medications crushed in puree and intermittent supervision to cue for the following compensatory strategies: 1) Small bites/sips 2) Slow rate of intake 3) Sit upright as possible 4) Remain upright for 30+ minutes following PO intake.     Swallow Evaluation Recommendations       SLP Diet Recommendations: Dysphagia 2 (Fine chop) solids;Thin liquid   Liquid Administration via: Straw;Cup   Medication Administration: Crushed with puree   Supervision: Staff to assist with self feeding;Intermittent supervision to cue for compensatory strategies   Compensations: Minimize environmental distractions;Slow rate;Small sips/bites;Effortful  swallow   Postural Changes: Remain semi-upright after after feeds/meals (Comment);Seated upright at 90 degrees   Oral Care Recommendations: Oral care BID       Colin Mulders M.S., CCC-SLP Acute Rehabilitation Services Office: [phone]  Elvia Collum Lakoda Raske 03/06/2019,2:09 PM
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
## 2019-03-06 NOTE — TOC Initial Note (Signed)
Transition of Care [facility]) - Initial/Assessment Note    Patient Details  Name: Bruce Hayes MRN: [id number] Date of Birth: [birth year]  Transition of [facility]) CM/SW Contact:    Vinie Sill, [place] Phone Number: 03/06/2019, 4:41 PM  Clinical Narrative:                  CSW visit with the patient at bedside. CSW introduced self and explained role. CSW discussed PT recommendation of ST rehab at [facility]. Patient states he lives home alone. Patient states he was recently at [facility] and is certain he does not want to return there. Patient acknowledges and recognizes the need for ST rehab. Patient gave CSW permission to send referrals to SNFs. Patient states he needs help  with his ADLs  "like a living assistant facility".  Patient requested to contact his sister,[name].   CSW spoke with the patient's sister, [name]. She states the patient is no longer able to care for himself. She is in agreement with discharge plan of ST rehab at [facility].  CSW will provide bed offers once available.  Thurmond Butts, MSW, LCSWA Clinical Social Worker    Expected Discharge Plan: Skilled Nursing Facility Barriers to Discharge: SNF Pending bed offer, Continued Medical Work up   Patient Goals and CMS Choice        Expected Discharge Plan and Services Expected Discharge Plan: [place] In-house Referral: Clinical Social Work     Living arrangements for the past 2 months: Apartment                                      Prior Living Arrangements/Services Living arrangements for the past 2 months: Apartment Lives with:: Self Patient language and need for interpreter reviewed:: No        Need for Family Participation in Patient Care: Yes (Comment) Care giver support system in place?: Yes (comment)   Criminal Activity/Legal Involvement Pertinent to Current Situation/Hospitalization: No - Comment as needed  Activities of Daily Living Home Assistive Devices/Equipment:  Walker (specify type) ADL Screening (condition at time of admission) Patient's cognitive ability adequate to safely complete daily activities?: Yes Is the patient deaf or have difficulty hearing?: No Does the patient have difficulty seeing, even when wearing glasses/contacts?: No Does the patient have difficulty concentrating, remembering, or making decisions?: Yes Patient able to express need for assistance with ADLs?: Yes Does the patient have difficulty dressing or bathing?: Yes Independently performs ADLs?: Yes (appropriate for developmental age) Does the patient have difficulty walking or climbing stairs?: Yes Weakness of Legs: Both Weakness of Arms/Hands: None  Permission Sought/Granted Permission sought to share information with : Family Supports, Customer service manager, Case Optician, dispensing granted to share information with : Yes, Verbal Permission Granted  Share Information with NAME: [name] [phone]  Permission granted to share info w AGENCY: SNFs  Permission granted to share info w Relationship: sister  Permission granted to share info w Contact Information: [phone]  Emotional Assessment       Orientation: : Oriented to Self, Oriented to Place, Oriented to  Time, Oriented to Situation Alcohol / Substance Use: Not Applicable Psych Involvement: No (comment)  Admission diagnosis:  Weakness [R53.1] Recurrent falls [R29.6] Symptomatic anemia [D64.9] Anemia, unspecified type [D64.9] Patient Active Problem List   Diagnosis Date Noted  . Metabolic acidosis, NAG, failure of bicarbonate regeneration 03/04/2019  . Symptomatic anemia 03/04/2019  .  Sepsis ([employer] [employer]) 01/27/2019  . E coli bacteremia 01/27/2019  . Positive RPR test 01/27/2019  . History of anemia due to chronic kidney disease 01/27/2019  . Urinary tract infection 01/27/2019  . Encephalopathy 01/24/2019  . Acute on chronic renal failure ([place]) 12/29/2018  . Acute metabolic encephalopathy  57/26/2035  . Hepatic encephalopathy ([place]) 10/31/2018  . Encounter for colonoscopy due to history of adenomatous colonic polyps   . Benign neoplasm of ascending colon   . Other cirrhosis of liver ([place])   . Gastritis and gastroduodenitis   . Portal hypertensive gastropathy ([place])   . Hypertensive urgency 08/27/2016  . Acute kidney injury superimposed on CKD ([place]) 08/27/2016  . Headache 08/27/2016  . Blurred vision 08/27/2016  . AKI (acute kidney injury) ([place]) 08/27/2016  . Severe alcohol use disorder ([place]) 11/11/2015  . Alcohol use disorder, severe, dependence ([place]) 11/08/2015  . Alcohol abuse 12/07/2013  . Cocaine abuse ([place]) 12/07/2013  . Polysubstance abuse ([place]) 12/07/2013  . Alcohol use disorder, moderate, dependence ([place]) 12/07/2013  . Depression   . Esophageal varices ([place]) 06/02/2008  . 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 CKD (chronic kidney disease), stage III 09/06/2007  . HEPATITIS B CARRIER 07/16/2007  . UNSPECIFIED DISORDER OF LIVER 07/03/2007  . HEPATITIS C 05/22/2007  . HYPERLIPIDEMIA 05/22/2007  . OBESITY 05/22/2007  . THROMBOCYTOPENIA 05/22/2007  . TOBACCO ABUSE 05/22/2007  . Essential hypertension 05/22/2007  . COCAINE ABUSE, HX OF 05/22/2007   PCP:  Lorene Dy, MD Pharmacy:   [facility] [place], [place] [address] Phone: [phone] Fax: [phone]     Social Determinants of Health (SDOH) Interventions    Readmission Risk Interventions Readmission Risk Prevention Plan 12/30/2018 11/02/2018  Transportation Screening Complete Complete  PCP or Specialist Appt within 3-5 Days - Not Complete  Not Complete comments - plan for SNF  HRI or [place] - Not Complete  HRI or Home Care Consult comments - plan for SNF  Social Work Consult for [place] Planning/Counseling - Complete  Palliative Care Screening - Not Applicable   Medication Review (RN Care Manager) Complete Complete  PCP or Specialist appointment within 3-5 days of discharge Not Complete -  PCP/Specialist Appt Not Complete comments Patient will make own pcp appt;not ready for d/c yet. -  HRI or Home Care Consult Complete -  SW Recovery Care/Counseling Consult Complete -  Palliative Care Screening Not Applicable -  Central Not Applicable -  Some recent data might be hidden

## 2019-03-06 NOTE — Plan of Care (Signed)
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
## 2019-03-07 LAB — COMPREHENSIVE METABOLIC PANEL
ALT: 23 U/L (ref 0–44)
AST: 51 U/L — ABNORMAL HIGH (ref 15–41)
Albumin: 1.4 g/dL — ABNORMAL LOW (ref 3.5–5.0)
Alkaline Phosphatase: 77 U/L (ref 38–126)
Anion gap: 10 (ref 5–15)
BUN: 31 mg/dL — ABNORMAL HIGH (ref 8–23)
CO2: 17 mmol/L — ABNORMAL LOW (ref 22–32)
Calcium: 7.7 mg/dL — ABNORMAL LOW (ref 8.9–10.3)
Chloride: 119 mmol/L — ABNORMAL HIGH (ref 98–111)
Creatinine, Ser: 2.02 mg/dL — ABNORMAL HIGH (ref 0.61–1.24)
GFR calc Af Amer: 37 mL/min — ABNORMAL LOW (ref >60)
GFR calc non Af Amer: 32 mL/min — ABNORMAL LOW (ref >60)
Glucose, Bld: 96 mg/dL (ref 70–99)
Potassium: 4.3 mmol/L (ref 3.5–5.1)
Sodium: 146 mmol/L — ABNORMAL HIGH (ref 135–145)
Total Bilirubin: 1.8 mg/dL — ABNORMAL HIGH (ref 0.3–1.2)
Total Protein: 5.5 g/dL — ABNORMAL LOW (ref 6.5–8.1)

## 2019-03-07 LAB — CBC
HCT: 29.9 % — ABNORMAL LOW (ref 39.0–52.0)
Hemoglobin: 10.4 g/dL — ABNORMAL LOW (ref 13.0–17.0)
MCH: 28.7 pg (ref 26.0–34.0)
MCHC: 34.8 g/dL (ref 30.0–36.0)
MCV: 82.6 fL (ref 80.0–100.0)
Platelets: 81 10*3/uL — ABNORMAL LOW (ref 150–400)
RBC: 3.62 MIL/uL — ABNORMAL LOW (ref 4.22–5.81)
RDW: 16.2 % — ABNORMAL HIGH (ref 11.5–15.5)
WBC: 7.5 10*3/uL (ref 4.0–10.5)
nRBC: 0.4 % — ABNORMAL HIGH (ref 0.0–0.2)

## 2019-03-07 NOTE — TOC Progression Note (Signed)
Transition of Care [facility]) - Progression Note    Patient Details  Name: Bruce Hayes MRN: [id number] Date of Birth: [birth year]  Transition of Care [facility]) CM/SW [place], [place] Phone Number: 03/07/2019, 10:04 AM  Clinical Narrative:     Patient has no bed offers. Hx of ETOH and polysubstance abuse are barriers.   CSW will continue to follow and assist with discharge planning.    Thurmond Butts, MSW, [place] Clinical Social Worker'    Expected Discharge Plan: [place] Barriers to Discharge: SNF Pending bed offer, Continued Medical Work up  Expected Discharge Plan and Services Expected Discharge Plan: [place] In-house Referral: Clinical Social Work     Living arrangements for the past 2 months: Apartment                                       Social Determinants of Health (SDOH) Interventions    Readmission Risk Interventions Readmission Risk Prevention Plan 03/06/2019 12/30/2018 11/02/2018  Transportation Screening Complete Complete Complete  PCP or Specialist Appt within 3-5 Days - - Not Complete  Not Complete comments - - plan for SNF  HRI or [place] - - Not Complete  HRI or Home Care Consult comments - - plan for SNF  Social Work Consult for [place] Planning/Counseling - - Complete  Palliative Care Screening - - Not Applicable  Medication Review (RN Care Manager) Complete Complete Complete  PCP or Specialist appointment within 3-5 days of discharge Complete Not Complete -  PCP/Specialist Appt Not Complete comments - Patient will make own pcp appt;not ready for d/c yet. -  HRI or Home Care Consult - Complete -  SW Recovery Care/Counseling Consult Complete Complete -  Palliative Care Screening - Not Applicable -  [place] Complete Not Applicable -  Some recent data might be hidden
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
## 2019-03-07 NOTE — Progress Notes (Signed)
  Speech Language Pathology Treatment: Dysphagia  Patient Details Name: Bruce Hayes MRN: [id number] DOB: October 04, 1945 Today's Date: 03/07/2019 Time: 2725-3664 SLP Time Calculation (min) (ACUTE ONLY): 26 min  Assessment / Plan / Recommendation Clinical Impression  Observed pt with chopped solids as well as thin liquids (cup and straw). RN present to report pt tolerated breakfast and medication pass with pills whole in puree. She reported he did not need cueing to eat slowly. Results of MBS were reviewed as well as aspiration recommendations. Effortful swallow technique and supraglottic swallow were taught and practiced with sips of liquid. Pt required moderate cues to perform. Patient's sister may bring his dentures and at that time advancement of diet will be assessed. ST will continue to follow for diet upgrade and pharyngeal strengthening exercises.    HPI HPI: 74 y.o. male with past medical history significant for CKD, alcohol abuse, polysubstance abuse, CAD, depression, hepatitis C, hypothyroidism, treated secondary syphilis who presents 2/1 for evaluation of fall.  Recent hospital admission 12/25-1/1 for AKI, hyperkalemia and sepsis.  Patient was discharged to SNF, then D/Cd home approximately ten days before this admission. Since arrival home he has had multiple falls, EMS called 3-4 times to get him off the floor. Sister found him soiled in feces day of this admission. Pt was scheduled for an OP MBS for February 3.        SLP Plan   Continue current plan of care.        Recommendations  Compensations: Minimize environmental distractions;Slow rate;Small sips/bites;Effortful swallow Postural Changes and/or Swallow Maneuvers: Seated upright 90 degrees                Follow up Recommendations: Skilled Nursing facility;Home health SLP SLP Visit Diagnosis: Dysphagia, oropharyngeal phase (R13.12)       GO               Wynelle Bourgeois., MA, CCC-SLP 03/07/2019, 9:43 AM

## 2019-03-08 LAB — COMPREHENSIVE METABOLIC PANEL
ALT: 22 U/L (ref 0–44)
AST: 49 U/L — ABNORMAL HIGH (ref 15–41)
Albumin: 1.4 g/dL — ABNORMAL LOW (ref 3.5–5.0)
Alkaline Phosphatase: 76 U/L (ref 38–126)
Anion gap: 7 (ref 5–15)
BUN: 31 mg/dL — ABNORMAL HIGH (ref 8–23)
CO2: 15 mmol/L — ABNORMAL LOW (ref 22–32)
Calcium: 7.8 mg/dL — ABNORMAL LOW (ref 8.9–10.3)
Chloride: 121 mmol/L — ABNORMAL HIGH (ref 98–111)
Creatinine, Ser: 2.06 mg/dL — ABNORMAL HIGH (ref 0.61–1.24)
GFR calc Af Amer: 36 mL/min — ABNORMAL LOW (ref >60)
GFR calc non Af Amer: 31 mL/min — ABNORMAL LOW (ref >60)
Glucose, Bld: 98 mg/dL (ref 70–99)
Potassium: 4.4 mmol/L (ref 3.5–5.1)
Sodium: 143 mmol/L (ref 135–145)
Total Bilirubin: 2 mg/dL — ABNORMAL HIGH (ref 0.3–1.2)
Total Protein: 5.7 g/dL — ABNORMAL LOW (ref 6.5–8.1)

## 2019-03-08 LAB — CBC WITH DIFFERENTIAL/PLATELET
Abs Immature Granulocytes: 0.03 10*3/uL (ref 0.00–0.07)
Basophils Absolute: 0 10*3/uL (ref 0.0–0.1)
Basophils Relative: 1 %
Eosinophils Absolute: 0.1 10*3/uL (ref 0.0–0.5)
Eosinophils Relative: 2 %
HCT: 31 % — ABNORMAL LOW (ref 39.0–52.0)
Hemoglobin: 10.7 g/dL — ABNORMAL LOW (ref 13.0–17.0)
Immature Granulocytes: 0 %
Lymphocytes Relative: 29 %
Lymphs Abs: 2.3 10*3/uL (ref 0.7–4.0)
MCH: 28.2 pg (ref 26.0–34.0)
MCHC: 34.5 g/dL (ref 30.0–36.0)
MCV: 81.8 fL (ref 80.0–100.0)
Monocytes Absolute: 0.9 10*3/uL (ref 0.1–1.0)
Monocytes Relative: 11 %
Neutro Abs: 4.6 10*3/uL (ref 1.7–7.7)
Neutrophils Relative %: 57 %
Platelets: 76 10*3/uL — ABNORMAL LOW (ref 150–400)
RBC: 3.79 MIL/uL — ABNORMAL LOW (ref 4.22–5.81)
RDW: 16.5 % — ABNORMAL HIGH (ref 11.5–15.5)
WBC: 7.9 10*3/uL (ref 4.0–10.5)
nRBC: 0 % (ref 0.0–0.2)

## 2019-03-08 LAB — SARS CORONAVIRUS 2 (TAT 6-24 HRS): SARS Coronavirus 2: NEGATIVE

## 2019-03-08 MED ORDER — ENSURE ENLIVE PO LIQD
237.0000 mL | Freq: Two times a day (BID) | ORAL | Status: DC
  Administered 2019-03-08 – 2019-03-12 (×6): 237 mL via ORAL

## 2019-03-08 MED ORDER — PRO-STAT SUGAR FREE PO LIQD
30.0000 mL | Freq: Two times a day (BID) | ORAL | Status: DC
  Administered 2019-03-08: 21:00:00 30 mL via ORAL
  Filled 2019-03-08 (×6): qty 30

## 2019-03-08 NOTE — [place] FL2 (Signed)
[place] MEDICAID FL2 LEVEL OF CARE SCREENING TOOL     IDENTIFICATION  Patient Name: Bruce Hayes Birthdate: Mar 21, 1945 Sex: male Admission Date (Current Location): 03/04/2019  [facility] and [place] Number:  Herbalist and Address:  [facility] [place]. [facility], [place] [address] [place], [place] [address]      Provider Number: [id number]  Attending Physician Name and Address:  Lavina Hamman, MD  Relative Name and Phone Number:  [name] [phone]    Current Level of Care: Hospital Recommended Level of Care: [place] Prior Approval Number:    Date Approved/Denied:   PASRR Number: [id number]  Discharge Plan: SNF    Current Diagnoses: Patient Active Problem List   Diagnosis Date Noted  . Metabolic acidosis, NAG, failure of bicarbonate regeneration 03/04/2019  . Symptomatic anemia 03/04/2019  . Sepsis ([place]) 01/27/2019  . E coli bacteremia 01/27/2019  . Positive RPR test 01/27/2019  . History of anemia due to chronic kidney disease 01/27/2019  . Urinary tract infection 01/27/2019  . Encephalopathy 01/24/2019  . Acute on chronic renal failure ([place]) 12/29/2018  . Acute metabolic encephalopathy 75/44/9201  . Hepatic encephalopathy ([place]) 10/31/2018  . Encounter for colonoscopy due to history of adenomatous colonic polyps   . Benign neoplasm of ascending colon   . Other cirrhosis of liver ([place])   . Gastritis and gastroduodenitis   . Portal hypertensive gastropathy ([place])   . Hypertensive urgency 08/27/2016  . Acute kidney injury superimposed on CKD ([place]) 08/27/2016  . Headache 08/27/2016  . Blurred vision 08/27/2016  . AKI (acute kidney injury) ([place]) 08/27/2016  . Severe alcohol use disorder ([place]) 11/11/2015  . Alcohol use disorder, severe, dependence ([place]) 11/08/2015  . Alcohol abuse 12/07/2013  . Cocaine abuse ([place]) 12/07/2013  . Polysubstance abuse ([place]) 12/07/2013  . Alcohol use disorder, moderate, dependence ([place])  12/07/2013  . Depression   . Esophageal varices ([place]) 06/02/2008  . 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 CKD (chronic kidney disease), stage III 09/06/2007  . HEPATITIS B CARRIER 07/16/2007  . UNSPECIFIED DISORDER OF LIVER 07/03/2007  . HEPATITIS C 05/22/2007  . HYPERLIPIDEMIA 05/22/2007  . OBESITY 05/22/2007  . THROMBOCYTOPENIA 05/22/2007  . TOBACCO ABUSE 05/22/2007  . Essential hypertension 05/22/2007  . COCAINE ABUSE, HX OF 05/22/2007    Orientation RESPIRATION BLADDER Height & Weight     Self, Time, Situation, Place  Normal Incontinent, External catheter Weight: 173 lb 1 oz (78.5 kg) Height:  5\' 11"  (180.3 cm)  BEHAVIORAL SYMPTOMS/MOOD NEUROLOGICAL BOWEL NUTRITION STATUS      Incontinent Diet(please see discharge summary)  AMBULATORY STATUS COMMUNICATION OF NEEDS Skin   Limited Assist Verbally Normal                       Personal Care Assistance Level of Assistance  Bathing, Dressing, Feeding Bathing Assistance: Limited assistance Feeding assistance: Independent Dressing Assistance: Limited assistance     Functional Limitations Info  Hearing, Speech, Sight Sight Info: Adequate Hearing Info: Adequate Speech Info: Adequate    SPECIAL CARE FACTORS FREQUENCY  PT (By licensed PT), OT (By licensed OT)     PT Frequency: 3x per week OT Frequency: 3x per week            Contractures Contractures Info: Not present    Additional Factors Info  Code Status, Allergies Code Status Info: FULL Allergies Info: Tylenol           Current Medications (03/08/2019):  This is  the current hospital active medication list Current Facility-Administered Medications  Medication Dose Route Frequency Provider Last Rate Last Admin  . amLODipine (NORVASC) tablet 5 mg  5 mg Oral Daily Darliss Cheney, MD   5 mg at 03/08/19 0910  . carvedilol (COREG) tablet 3.125 mg  3.125 mg Oral BID WC Jennette Kettle M, DO   3.125 mg at 03/08/19 0855  . cloNIDine (CATAPRES - Dosed in mg/24  hr) patch 0.3 mg  0.3 mg Transdermal Weekly Darliss Cheney, MD   0.3 mg at 03/06/19 1055  . folic acid (FOLVITE) tablet 1 mg  1 mg Oral Daily Jennette Kettle M, DO   1 mg at 03/08/19 0856  . hydrALAZINE (APRESOLINE) injection 10-20 mg  10-20 mg Intravenous Q4H PRN Etta Quill, DO   20 mg at 03/06/19 2018  . lactulose (CHRONULAC) 10 GM/15ML solution 20 g  20 g Oral BID Jennette Kettle M, DO      . levothyroxine (SYNTHROID) tablet 50 mcg  50 mcg Oral QAC breakfast Etta Quill, DO   50 mcg at 03/08/19 0534  . lip balm (CARMEX) ointment 1 application  1 application Topical PRN Etta Quill, DO      . ondansetron [facility]) tablet 4 mg  4 mg Oral Q6H PRN Etta Quill, DO       Or  . ondansetron [facility]) injection 4 mg  4 mg Intravenous Q6H PRN Etta Quill, DO      . oxyCODONE (Oxy IR/ROXICODONE) immediate release tablet 5 mg  5 mg Oral Q6H PRN Etta Quill, DO   5 mg at 03/07/19 1936  . pantoprazole (PROTONIX) EC tablet 40 mg  40 mg Oral BID Jennette Kettle M, DO   40 mg at 03/08/19 0856  . sertraline (ZOLOFT) tablet 25 mg  25 mg Oral Daily Jennette Kettle M, DO   25 mg at 03/08/19 0856  . sodium bicarbonate tablet 650 mg  650 mg Oral Daily Etta Quill, DO   650 mg at 03/08/19 2449     Discharge Medications: Please see discharge summary for a list of discharge medications.  Relevant Imaging Results:  Relevant Lab Results:   Additional Information SSN [id number]  Vinie Sill, LCSWA

## 2019-03-08 NOTE — Progress Notes (Signed)
[facility] Progress Note  Patient: Bruce Hayes    WGN:[id number]  DOA: 03/04/2019     Date of Service: the patient was seen and examined on 03/08/2019  Chief Complaint  Patient presents with  . 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  Patient presents with generalized weakness and falls at home.Patient had fall off the commode yesterday evening. He was able to ambulate to bed however was found by family today covered in feces. He was too weak to ambulate. Was recently admitted to hospitalist for AKI, hyperkalemia and sepsis. Patient was discharged to SNF however subsequently dischargedfrom SNF. He lives alone at home currently (not working very well).  No fever, chills, nausea, vomiting, chest pain, shortness of breath, abdominal pain, diarrhea, dysuria.  No melena nor hematochezia.  Collateral information from patient sisterCarrier Evans.States patient was discharged from [facility] facility on Friday. Since then patient has had multiple falls. EMS has been called out 3 or 4 times to get him up off the floor. Sister states patient was found in his bed soiled earlier today. According to sister he has been there since Sunday. Patient not able to take care of himself or perform his ADLs. States his weakness has significantly worsened over the last 3 days. Sister states patient no longer drinking alcohol or any substance use.  Currently further plan is await placement.  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   Received 2 units of blood transfusion.   Posttransfusion  hemoglobin stable. FOBT negative. Monitor now.  Pt refuses lactulose.   Essential hypertension: Fairly controlled We will continue to monitor on current home medications which is carvedilol and clonidine patch.   Continue as needed hydralazine.  CKD stage IIIb:  At baseline.  Continue to watch.  Non-anion gap metabolic acidosis:  Could be due to dehydration.   Improving on oral bicarb. Monitor.  Generalized weakness/frequent falls:  Patient was discharged about 10 [facility] ago from [facility].   He still feels significantly weak and reportedly has had several falls since discharge.   CT head, Cspine and CT pelvis negative for acute fractures or any other acute abnormality.  Dysphagia  From deconditioning  MBS completed  On dys II diet  Right buttock pressure injury/left buttock stage II pressure ulcer/left heel deep tissue injury, present on admission:  Wound care on board  Diet: dys 2 diet DVT Prophylaxis: SCD, pharmacological prophylaxis contraindicated due to bleeding   Advance goals of care discussion: Full code  Family Communication: no family was present at bedside, at the time of interview.   Disposition:  Pt is from SNF, admitted with anemia recurrent falls, still has generalized weakness and imbalance, which precludes a safe discharge. Discharge to SNF, when bed available.  Subjective: no acute complains, no events overnight, BM this AM no bleed.   Physical Exam: General:  alert oriented to time, place, and person.  Appear in mild distress, affect appropriate Eyes: PERRL ENT: Oral Mucosa Clear, moist  Neck: no JVD,  Cardiovascular: S1 and S2 Present, no Murmur,  Respiratory: good respiratory effort, Bilateral Air entry equal and Decreased, no Crackles, no wheezes Abdomen: Bowel Sound present, Soft and  no tenderness,  Skin: no rash Extremities: no Pedal edema, no calf tenderness Neurologic: without any new focal findings Gait not checked due to  patient safety concerns  Vitals:   03/07/19 2000 03/08/19 0400 03/08/19 0432 03/08/19 0855  BP: (!) 159/65 (!) 149/58  (!) 147/57  Pulse: 79 80  72  Resp: 18 20    Temp:  100 F (37.8 C)    TempSrc:  Oral    SpO2: 94% 92%    Weight:   78.5 kg   Height:        Intake/Output Summary (Last 24 hours) at 03/08/2019 1105 Last data filed at 03/08/2019 0840 Gross per 24 hour  Intake 240 ml  Output 1325 ml  Net -1085 ml   Filed Weights   03/04/19 2212 03/05/19 0400 03/08/19 0432  Weight: 76.5 kg 77.4 kg 78.5 kg    Data Reviewed: I have personally reviewed and interpreted daily labs, tele strips, imagings as discussed above. I reviewed all nursing notes, pharmacy notes, vitals, pertinent old records I have discussed plan of care as described above with RN and patient/family.  CBC: Recent Labs  Lab 03/04/19 1700 03/04/19 1700 03/05/19 0330 03/05/19 1620 03/06/19 0619 03/07/19 0224 03/08/19 0808  WBC 10.3   < > 8.1 6.3 7.0 7.5 7.9  NEUTROABS 6.1  --   --   --   --   --  4.6  HGB 6.4*   < > 12.0* 11.6* 11.7* 10.4* 10.7*  HCT 18.8*   < > 34.0* 33.4* 33.5* 29.9* 31.0*  MCV 82.5   < > 81.0 82.7 81.5 82.6 81.8  PLT 134*   < > 84* 92* 73* 81* 76*   < > = values in this interval not displayed.   Basic Metabolic Panel: Recent Labs  Lab 03/04/19 1700 03/05/19 0330 03/06/19 0619 03/07/19 0224 03/08/19 0808  NA 145 147* 143 146* 143  K 4.4 4.6 4.3 4.3 4.4  CL 116* 122* 116* 119* 121*  CO2 16* 15* 17* 17* 15*  GLUCOSE 88 98 97 96 98  BUN 41* 40* 34* 31* 31*  CREATININE 2.12* 1.92* 1.90* 2.02* 2.06*  CALCIUM 8.2* 8.1* 8.0* 7.7* 7.8*    Studies: No results found.  Scheduled Meds: . amLODipine  5 mg Oral Daily  . carvedilol  3.125 mg Oral BID WC  . cloNIDine  0.3 mg Transdermal Weekly  . feeding supplement (ENSURE ENLIVE)  237 mL Oral BID BM  . feeding supplement (PRO-STAT SUGAR FREE 64)  30 mL Oral BID  . folic acid  1 mg Oral Daily  . lactulose  20 g Oral BID  .  levothyroxine  50 mcg Oral QAC breakfast  . pantoprazole  40 mg Oral BID  . sertraline  25 mg Oral Daily  . sodium bicarbonate  650 mg Oral Daily   Continuous Infusions: PRN Meds: hydrALAZINE, lip balm, ondansetron **OR** ondansetron (ZOFRAN) IV, oxyCODONE  Time spent: 35 minutes  Author: Berle Mull, MD [facility] 03/08/2019 11:05 AM  To reach On-call, see care teams to locate the attending and reach out to them via [email or web address]. If 7PM-7AM, please contact night-coverage If you still have difficulty reaching the attending provider, please page the [facility] (Director on Call) for [facility] on amion for assistance.

## 2019-03-08 NOTE — TOC Progression Note (Signed)
Transition of Care [facility]) - Progression Note    Patient Details  Name: Bruce Hayes MRN: [id number] Date of Birth: [birth year]  Transition of Care [facility]) CM/SW [place], [place] Phone Number: 03/08/2019, 12:19 PM  Clinical Narrative:     CSW visit with patient at bedside. CSW informed patient he has bed offer with [facility] Patient states he prefers to return to [facility].  CSW contacted [facility]- no availability until Monday, possibly tomorrow. They will informed CSW tomorrow of bed status.  CSW updated patient's sister as requested.   RN and MD updated. CSW requested Covid test.   Thurmond Butts, MSW, [place] Clinical Social Worker    Expected Discharge Plan: Skilled Nursing Facility Barriers to Discharge: SNF Pending bed offer, Continued Medical Work up(HX ETOH and substance abuse)  Expected Discharge Plan and Services Expected Discharge Plan: [place] In-house Referral: Clinical Social Work     Living arrangements for the past 2 months: Apartment                                       Social Determinants of Health (SDOH) Interventions    Readmission Risk Interventions Readmission Risk Prevention Plan 03/06/2019 12/30/2018 11/02/2018  Transportation Screening Complete Complete Complete  PCP or Specialist Appt within 3-5 Days - - Not Complete  Not Complete comments - - plan for SNF  HRI or [place] - - Not Complete  HRI or Home Care Consult comments - - plan for SNF  Social Work Consult for [place] Planning/Counseling - - Complete  Palliative Care Screening - - Not Applicable  Medication Review (RN Care Manager) Complete Complete Complete  PCP or Specialist appointment within 3-5 days of discharge Complete Not Complete -  PCP/Specialist Appt Not Complete comments - Patient will make own pcp appt;not ready for d/c yet. -  HRI or Home Care Consult - Complete -  SW Recovery  Care/Counseling Consult Complete Complete -  Palliative Care Screening - Not Applicable -  [place] Complete Not Applicable -  Some recent data might be hidden

## 2019-03-09 LAB — CBC
HCT: 31.8 % — ABNORMAL LOW (ref 39.0–52.0)
Hemoglobin: 10.8 g/dL — ABNORMAL LOW (ref 13.0–17.0)
MCH: 28.4 pg (ref 26.0–34.0)
MCHC: 34 g/dL (ref 30.0–36.0)
MCV: 83.7 fL (ref 80.0–100.0)
Platelets: 77 10*3/uL — ABNORMAL LOW (ref 150–400)
RBC: 3.8 MIL/uL — ABNORMAL LOW (ref 4.22–5.81)
RDW: 16.5 % — ABNORMAL HIGH (ref 11.5–15.5)
WBC: 6.8 10*3/uL (ref 4.0–10.5)
nRBC: 0 % (ref 0.0–0.2)

## 2019-03-09 LAB — BASIC METABOLIC PANEL
Anion gap: 9 (ref 5–15)
BUN: 33 mg/dL — ABNORMAL HIGH (ref 8–23)
CO2: 16 mmol/L — ABNORMAL LOW (ref 22–32)
Calcium: 7.7 mg/dL — ABNORMAL LOW (ref 8.9–10.3)
Chloride: 120 mmol/L — ABNORMAL HIGH (ref 98–111)
Creatinine, Ser: 1.96 mg/dL — ABNORMAL HIGH (ref 0.61–1.24)
GFR calc Af Amer: 39 mL/min — ABNORMAL LOW (ref >60)
GFR calc non Af Amer: 33 mL/min — ABNORMAL LOW (ref >60)
Glucose, Bld: 103 mg/dL — ABNORMAL HIGH (ref 70–99)
Potassium: 3.9 mmol/L (ref 3.5–5.1)
Sodium: 145 mmol/L (ref 135–145)

## 2019-03-09 NOTE — Progress Notes (Signed)
[facility] Progress Note  Patient: Bruce Hayes    ZHG:[id number]  DOA: 03/04/2019     Date of Service: the patient was seen and examined on 03/09/2019  Chief Complaint  Patient presents with  . 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  Patient presents with generalized weakness and falls at home.Patient had fall off the commode yesterday evening. He was able to ambulate to bed however was found by family today covered in feces. He was too weak to ambulate. Was recently admitted to hospitalist for AKI, hyperkalemia and sepsis. Patient was discharged to SNF however subsequently dischargedfrom SNF. He lives alone at home currently (not working very well).  No fever, chills, nausea, vomiting, chest pain, shortness of breath, abdominal pain, diarrhea, dysuria.  No melena nor hematochezia.  Collateral information from patient sisterCarrier Evans.States patient was discharged from [facility] facility on Friday. Since then patient has had multiple falls. EMS has been called out 3 or 4 times to get him up off the floor. Sister states patient was found in his bed soiled earlier today. According to sister he has been there since Sunday. Patient not able to take care of himself or perform his ADLs. States his weakness has significantly worsened over the last 3 days. Sister states patient no longer drinking alcohol or any substance use.  Currently further plan is await placement.  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   Received 2 units of  blood transfusion.   Posttransfusion hemoglobin stable. FOBT negative Pt refuses lactulose.   Essential hypertension: Fairly controlled We will continue to monitor on current home medications which is carvedilol and clonidine patch.   Continue as needed hydralazine.  CKD stage IIIb:  At baseline.  Continue to watch.  Non-anion gap metabolic acidosis:  Could be due to dehydration.   Improving on oral bicarb. Monitor.  Generalized weakness/frequent falls:  Patient was discharged about 10 days ago from [facility].   He still feels significantly weak and reportedly has had several falls since discharge.   CT head, Cspine and CT pelvis negative for acute fractures or any other acute abnormality.  Dysphagia  From deconditioning  MBS completed  On dys II diet Outpatient speech therapy consult recommended.  Right buttock pressure injury/left buttock stage II pressure ulcer/left heel deep tissue injury, present on admission:  Wound care on board  Diet: dys 2 diet DVT Prophylaxis: SCD, pharmacological prophylaxis contraindicated due to bleeding   Advance goals of care discussion: Full code  Family Communication: no family was present at bedside, at the time of interview.   Disposition:  Pt is from SNF, admitted with anemia recurrent falls, still has generalized weakness and imbalance, which precludes a safe discharge. Discharge to SNF, tomorrow on 03/10/2019.  Subjective: Had a bowel movement without any blood.  No fever no chills.  Physical Exam: General:  alert oriented to time, place, and person.  Appear in mild distress, affect appropriate Eyes: PERRL ENT: Oral Mucosa Clear, moist  Neck: no JVD,  Cardiovascular: S1 and S2 Present, no Murmur,  Respiratory: good respiratory effort, Bilateral Air entry equal and Decreased, no Crackles, no  wheezes Abdomen: Bowel Sound present, Soft and no tenderness,  Skin: no rash Extremities: no Pedal edema, no calf  tenderness Neurologic: without any new focal findings Gait not checked due to patient safety concerns  Vitals:   03/09/19 0500 03/09/19 0526 03/09/19 0832 03/09/19 1719  BP:  131/72 (!) 149/59 (!) 150/61  Pulse:  71 71 73  Resp:  18 19 19   Temp:  98.7 F (37.1 C) 98 F (36.7 C) 98 F (36.7 C)  TempSrc:  Oral Oral Oral  SpO2:  98% 99% 97%  Weight: 78.4 kg     Height:        Intake/Output Summary (Last 24 hours) at 03/09/2019 1837 Last data filed at 03/09/2019 1700 Gross per 24 hour  Intake 320 ml  Output 500 ml  Net -180 ml   Filed Weights   03/05/19 0400 03/08/19 0432 03/09/19 0500  Weight: 77.4 kg 78.5 kg 78.4 kg    Data Reviewed: I have personally reviewed and interpreted daily labs, tele strips, imagings as discussed above. I reviewed all nursing notes, pharmacy notes, vitals, pertinent old records I have discussed plan of care as described above with RN and patient/family.  CBC: Recent Labs  Lab 03/04/19 1700 03/05/19 0330 03/05/19 1620 03/06/19 0619 03/07/19 0224 03/08/19 0808 03/09/19 0258  WBC 10.3   < > 6.3 7.0 7.5 7.9 6.8  NEUTROABS 6.1  --   --   --   --  4.6  --   HGB 6.4*   < > 11.6* 11.7* 10.4* 10.7* 10.8*  HCT 18.8*   < > 33.4* 33.5* 29.9* 31.0* 31.8*  MCV 82.5   < > 82.7 81.5 82.6 81.8 83.7  PLT 134*   < > 92* 73* 81* 76* 77*   < > = values in this interval not displayed.   Basic Metabolic Panel: Recent Labs  Lab 03/05/19 0330 03/06/19 0619 03/07/19 0224 03/08/19 0808 03/09/19 0258  NA 147* 143 146* 143 145  K 4.6 4.3 4.3 4.4 3.9  CL 122* 116* 119* 121* 120*  CO2 15* 17* 17* 15* 16*  GLUCOSE 98 97 96 98 103*  BUN 40* 34* 31* 31* 33*  CREATININE 1.92* 1.90* 2.02* 2.06* 1.96*  CALCIUM 8.1* 8.0* 7.7* 7.8* 7.7*    Studies: No results found.  Scheduled Meds: . amLODipine  5 mg Oral Daily  . carvedilol  3.125 mg Oral BID WC  . cloNIDine  0.3 mg Transdermal Weekly  . feeding supplement (ENSURE ENLIVE)  237 mL Oral BID BM  . feeding  supplement (PRO-STAT SUGAR FREE 64)  30 mL Oral BID  . folic acid  1 mg Oral Daily  . lactulose  20 g Oral BID  . levothyroxine  50 mcg Oral QAC breakfast  . pantoprazole  40 mg Oral BID  . sertraline  25 mg Oral Daily  . sodium bicarbonate  650 mg Oral Daily   Continuous Infusions: PRN Meds: hydrALAZINE, lip balm, ondansetron **OR** ondansetron (ZOFRAN) IV, oxyCODONE  Time spent: 35 minutes  Author: Berle Mull, MD [facility] 03/09/2019 6:37 PM  To reach On-call, see care teams to locate the attending and reach out to them via [email or web address]. If 7PM-7AM, please contact night-coverage If you still have difficulty reaching the attending provider, please page the [facility] (Director on Call) for [facility] on amion for assistance.

## 2019-03-09 NOTE — TOC Progression Note (Signed)
Transition of Care [facility]) - Progression Note    Patient Details  Name: Bruce Hayes MRN: [id number] Date of Birth: Mar 27, 1945  Transition of Care [facility]) CM/SW [place], [place] Phone Number: 03/09/2019, 1:30 PM  Clinical Narrative:     CSW contacted [name] to follow up on possible admission today. [facility] has not responded.   CSW will continue to follow and assist with discharge planning.   Thurmond Butts, MSW, [place] Clinical Social Worker   Expected Discharge Plan: Skilled Nursing Facility Barriers to Discharge: (no available bed until tomorrow or Monday)  Expected Discharge Plan and Services Expected Discharge Plan: [place] In-house Referral: Clinical Social Work     Living arrangements for the past 2 months: Apartment                                       Social Determinants of Health (SDOH) Interventions    Readmission Risk Interventions Readmission Risk Prevention Plan 03/06/2019 12/30/2018 11/02/2018  Transportation Screening Complete Complete Complete  PCP or Specialist Appt within 3-5 Days - - Not Complete  Not Complete comments - - plan for SNF  HRI or [place] - - Not Complete  HRI or Home Care Consult comments - - plan for SNF  Social Work Consult for [place] Planning/Counseling - - Complete  Palliative Care Screening - - Not Applicable  Medication Review (RN Care Manager) Complete Complete Complete  PCP or Specialist appointment within 3-5 days of discharge Complete Not Complete -  PCP/Specialist Appt Not Complete comments - Patient will make own pcp appt;not ready for d/c yet. -  HRI or Home Care Consult - Complete -  SW Recovery Care/Counseling Consult Complete Complete -  Palliative Care Screening - Not Applicable -  [place] Complete Not Applicable -  Some recent data might be hidden

## 2019-03-10 DIAGNOSIS — E43 Unspecified severe protein-calorie malnutrition: Secondary | ICD-10-CM

## 2019-03-10 DIAGNOSIS — R42 Dizziness and giddiness: Secondary | ICD-10-CM

## 2019-03-10 LAB — BASIC METABOLIC PANEL
Anion gap: 12 (ref 5–15)
BUN: 34 mg/dL — ABNORMAL HIGH (ref 8–23)
CO2: 20 mmol/L — ABNORMAL LOW (ref 22–32)
Calcium: 8 mg/dL — ABNORMAL LOW (ref 8.9–10.3)
Chloride: 113 mmol/L — ABNORMAL HIGH (ref 98–111)
Creatinine, Ser: 1.93 mg/dL — ABNORMAL HIGH (ref 0.61–1.24)
GFR calc Af Amer: 39 mL/min — ABNORMAL LOW (ref >60)
GFR calc non Af Amer: 34 mL/min — ABNORMAL LOW (ref >60)
Glucose, Bld: 116 mg/dL — ABNORMAL HIGH (ref 70–99)
Potassium: 4.4 mmol/L (ref 3.5–5.1)
Sodium: 145 mmol/L (ref 135–145)

## 2019-03-10 LAB — CBC
HCT: 32.3 % — ABNORMAL LOW (ref 39.0–52.0)
Hemoglobin: 11.2 g/dL — ABNORMAL LOW (ref 13.0–17.0)
MCH: 28.9 pg (ref 26.0–34.0)
MCHC: 34.7 g/dL (ref 30.0–36.0)
MCV: 83.2 fL (ref 80.0–100.0)
Platelets: 86 10*3/uL — ABNORMAL LOW (ref 150–400)
RBC: 3.88 MIL/uL — ABNORMAL LOW (ref 4.22–5.81)
RDW: 16.6 % — ABNORMAL HIGH (ref 11.5–15.5)
WBC: 7.2 10*3/uL (ref 4.0–10.5)
nRBC: 0 % (ref 0.0–0.2)

## 2019-03-10 MED ORDER — MUSCLE RUB 10-15 % EX CREA
1.0000 "application " | TOPICAL_CREAM | CUTANEOUS | Status: DC | PRN
  Administered 2019-03-11 (×2): 1 via TOPICAL
  Filled 2019-03-10: qty 85
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
## 2019-03-10 NOTE — Care Management Important Message (Signed)
Important Message  Patient Details  Name: Bruce Hayes MRN: [id number] Date of Birth: [birth year]   Medicare Important Message Given:  Yes     Shelda Altes 03/10/2019, 3:49 PM

## 2019-03-10 NOTE — Progress Notes (Signed)
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 Patient fell off the commode but was able to ambulate to bed. He, however, was found by family covered in feces. He was apparently too weak to ambulate. Per sisiter, patient had multiple falls since discharged from SNF, not able to take care of himself or perform his ADLs. Stated his weakness had significantly worsened over 3 days PTA. EMS, apparently, had been called out 3 or 4 times to get him up off the floor. ED course: Afebrile, vitals okay but found to have HGB 6.4, down from 8.5 in Jan. Hemoccult negative. Platelets 134 (up from 85 in Jan). BUN/cr 41/2.1 ( baseline creatinine ~ 1.9 to 2.0) .2u PRBC transfusion ordered in ED and TRH requested to admit patient.   Subjective:  Patient continues to report lightheadedness on standing , sometimes on sitting as well. He did not cooperate with orthostatic check this morning as he states he was afraid he would fall.   Objective: Vitals:   03/09/19 1910 03/10/19 0303 03/10/19 0827 03/10/19 1600  BP: (!) 147/57 (!) 159/61 (!) 137/108 (!) 132/56  Pulse: 75 81 74 68  Resp: 16 17 16 16   Temp: 97.9 F (36.6 C) 99.1 F (37.3 C)  98.6 F (37 C)  TempSrc: Oral Oral  Oral  SpO2: 96% 97% 99% 99%  Weight:  76.6 kg    Height:        Intake/Output Summary (Last 24 hours) at 03/10/2019 1742 Last data filed at 03/09/2019 2000 Gross per 24 hour  Intake 120 ml  Output --  Net 120 ml   Filed Weights   03/08/19 0432 03/09/19 0500 03/10/19 0303  Weight: 78.5 kg 78.4 kg 76.6 kg    Physical Examination:  General exam: Appears  calm and comfortable  Respiratory system: Clear to auscultation. Respiratory effort normal. Cardiovascular system: S1 & S2 heard, RRR. No JVD, murmurs, rubs, gallops or clicks. No pedal edema. Gastrointestinal system: Abdomen is nondistended, soft and nontender. Normal bowel sounds heard. Central nervous system: Alert and oriented. No new focal neurological deficits. Extremities: No contractures, edema or joint deformities.  Skin: No rashes, lesions or ulcers Psychiatry: Judgement and insight appear normal. Mood & affect appropriate.   Data Reviewed: I have personally reviewed following labs and imaging studies  CBC: Recent Labs  Lab 03/04/19 1700 03/05/19 0330 03/06/19 0619 03/07/19 0224 03/08/19 0808 03/09/19 0258 03/10/19 0326  WBC 10.3   < > 7.0 7.5 7.9 6.8 7.2  NEUTROABS 6.1  --   --   --  4.6  --   --   HGB 6.4*   < > 11.7* 10.4* 10.7* 10.8* 11.2*  HCT 18.8*   < > 33.5* 29.9* 31.0* 31.8* 32.3*  MCV 82.5   < > 81.5 82.6 81.8 83.7 83.2  PLT 134*   < > 73* 81* 76* 77* 86*   < > = values in this interval not displayed.   Basic Metabolic Panel: Recent Labs  Lab 03/06/19 0619 03/07/19 0224 03/08/19 0808 03/09/19 0258 03/10/19 0326  NA 143 146* 143 145 145  K 4.3 4.3 4.4 3.9 4.4  CL 116* 119* 121* 120*  113*  CO2 17* 17* 15* 16* 20*  GLUCOSE 97 96 98 103* 116*  BUN 34* 31* 31* 33* 34*  CREATININE 1.90* 2.02* 2.06* 1.96* 1.93*  CALCIUM 8.0* 7.7* 7.8* 7.7* 8.0*   GFR: Estimated Creatinine Clearance: 37.4 mL/min (A) (by C-G formula based on SCr of 1.93 mg/dL (H)). Liver Function Tests: Recent Labs  Lab 03/04/19 1700 03/07/19 0224 03/08/19 0808  AST 64* 51* 49*  ALT 30 23 22   ALKPHOS 106 77 76  BILITOT 1.9* 1.8* 2.0*  PROT 6.6 5.5* 5.7*  ALBUMIN 1.8* 1.4* 1.4*   Recent Labs  Lab 03/04/19 1700  LIPASE 36   No results for input(s): AMMONIA in the last 168 hours. Coagulation Profile: No results for input(s): INR, PROTIME in the last 168 hours. Cardiac  Enzymes: Recent Labs  Lab 03/04/19 1700  CKTOTAL 316   BNP (last 3 results) No results for input(s): PROBNP in the last 8760 hours. HbA1C: No results for input(s): HGBA1C in the last 72 hours. CBG: No results for input(s): GLUCAP in the last 168 hours. Lipid Profile: No results for input(s): CHOL, HDL, LDLCALC, TRIG, CHOLHDL, LDLDIRECT in the last 72 hours. Thyroid Function Tests: No results for input(s): TSH, T4TOTAL, FREET4, T3FREE, THYROIDAB in the last 72 hours. Anemia Panel: No results for input(s): VITAMINB12, FOLATE, FERRITIN, TIBC, IRON, RETICCTPCT in the last 72 hours. Sepsis Labs: Recent Labs  Lab 03/05/19 1351 03/05/19 1620  LATICACIDVEN 2.9* 2.0*    Recent Results (from the past 240 hour(s))  Urine culture     Status: Abnormal   Collection Time: 03/04/19  6:14 PM   Specimen: Urine, Random  Result Value Ref Range Status   Specimen Description URINE, RANDOM  Final   Special Requests NONE  Final   Culture (A)  Final    <10,000 COLONIES/mL INSIGNIFICANT GROWTH Performed at [facility], [place] [address], [place], [place] [address]    Report Status 03/05/2019 FINAL  Final  SARS CORONAVIRUS 2 (TAT 6-24 HRS) Nasopharyngeal Nasopharyngeal Swab     Status: None   Collection Time: 03/04/19  7:21 PM   Specimen: Nasopharyngeal Swab  Result Value Ref Range Status   SARS Coronavirus 2 NEGATIVE NEGATIVE Final    Comment: (NOTE) SARS-CoV-2 target nucleic acids are NOT DETECTED. The SARS-CoV-2 RNA is generally detectable in upper and lower respiratory specimens during the acute phase of infection. Negative results do not preclude SARS-CoV-2 infection, do not rule out co-infections with other pathogens, and should not be used as the sole basis for treatment or other patient management decisions. Negative results must be combined with clinical observations, patient history, and epidemiological information. The expected result is Negative. Fact Sheet for  Patients: SugarRoll.be Fact Sheet for Healthcare Providers: https://www.woods-mathews.com/ This test is not yet approved or cleared by the [place] FDA and  has been authorized for detection and/or diagnosis of SARS-CoV-2 by FDA under an Emergency Use Authorization (EUA). This EUA will remain  in effect (meaning this test can be used) for the duration of the COVID-19 declaration under Section 56 4(b)(1) of the Act, 21 U.S.C. section 360bbb-3(b)(1), unless the authorization is terminated or revoked sooner. Performed at [facility], [place] [address] [place], [place] [address]   SARS CORONAVIRUS 2 (TAT 6-24 HRS) Nasopharyngeal Nasopharyngeal Swab     Status: None   Collection Time: 03/08/19 12:20 PM   Specimen: Nasopharyngeal Swab  Result Value Ref Range Status   SARS Coronavirus 2 NEGATIVE NEGATIVE Final  Comment: (NOTE) SARS-CoV-2 target nucleic acids are NOT DETECTED. The SARS-CoV-2 RNA is generally detectable in upper and lower respiratory specimens during the acute phase of infection. Negative results do not preclude SARS-CoV-2 infection, do not rule out co-infections with other pathogens, and should not be used as the sole basis for treatment or other patient management decisions. Negative results must be combined with clinical observations, patient history, and epidemiological information. The expected result is Negative. Fact Sheet for Patients: SugarRoll.be Fact Sheet for Healthcare Providers: https://www.woods-mathews.com/ This test is not yet approved or cleared by the [place] FDA and  has been authorized for detection and/or diagnosis of SARS-CoV-2 by FDA under an Emergency Use Authorization (EUA). This EUA will remain  in effect (meaning this test can be used) for the duration of the COVID-19 declaration under Section 56 4(b)(1) of the Act, 21 U.S.C. section  360bbb-3(b)(1), unless the authorization is terminated or revoked sooner. Performed at [facility], [place] [address] [place], [place] [address]       Radiology Studies: No results found.      Scheduled Meds: . amLODipine  5 mg Oral Daily  . carvedilol  3.125 mg Oral BID WC  . cloNIDine  0.3 mg Transdermal Weekly  . feeding supplement (ENSURE ENLIVE)  237 mL Oral BID BM  . feeding supplement (PRO-STAT SUGAR FREE 64)  30 mL Oral BID  . folic acid  1 mg Oral Daily  . lactulose  20 g Oral BID  . levothyroxine  50 mcg Oral QAC breakfast  . pantoprazole  40 mg Oral BID  . sertraline  25 mg Oral Daily  . sodium bicarbonate  650 mg Oral Daily   Continuous Infusions:  Assessment & Plan:   1. Symptomatic acute of chronic blood loss anemia: Patient has h/o liver cirrhosis with consequent esophageal varices, portal hypertensive gastropathy and thrombocytopenia, which are all presumably contributing to chronic GI losses. FOBT x 1 however was negative.He may have chronic BMS as well. He does have h/o Hep C/hep B. Last EGD in 2018 revealed gastritis,mild portal gastropathy but no evidence of esophageal or gastric varices.Presenting hemoglobin 6.4. Received 2 units of blood transfusion. Posttransfusion hemoglobin been stable so far. He should f/u GI as outpatient for further w/u as warranted.   2. 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 Resume home meds including Lactulose. No on diuretics  3.Generalized weakness/frequent falls:Patient was discharged about 10 days PTA from [facility]. He still feels significantly weak and reportedly has had several falls since discharge from there. CT head, Cspine and CT pelvis negative for acute fractures or any other acute abnormality. Post transfusion feels somewhat better but PT eval still recommended SNF given generalized weakness, deconditioning. Check Orthostatics--patient now  agreeable  4. Essential hypertension: Resumed home medications carvedilol and clonidine patch while here and SBP atable around 140s  to 150s (orthostatics to be checked). Continue as needed hydralazine.  5.CKD stage IIIb/Non-anion gap metabolic acidosis: Creatinine been at baseline and stable around 1.9 to 2.0. Acidosis improved with IV fluids and on oral bicarb.   6. H/o Hep C and secondary syphilis : s/p treatment. No acute issues currently  7.Dysphagia : MBS completed .On dys II diet per speech therapy recommendations. Outpatient speech therapy f/u at SNF  8. Hypothyroidism: on synthroid. TSH nl in Dec/2020  9. Right buttock pressure injury/left buttock stage II pressure ulcer/left heel deep tissue injury, present on admission:  Seen by Wound care while here and to be followed up at [facility].Continue local care.   10. Severe protein -calorie malnutrition with hypoalbuminemia (<2.0) --on protein supplements   DVT prophylaxis: SCD, add Ted Hose Code Status: Full Family / Patient Communication: d/w patient Disposition Plan: Awaiting acceptance to SNF (?Back to [name])   LOS: 4 days    Time spent: 35 mins    Guilford Shi, MD [facility] Pager in [place]  If 7PM-7AM, please contact night-coverage [email or web address] Password [facility] 03/10/2019, 5:42 PM

## 2019-03-10 NOTE — TOC Progression Note (Signed)
Transition of Care [facility]) - Progression Note    Patient Details  Name: Bruce Hayes MRN: [id number] Date of Birth: 16-Aug-1945  Transition of Care [facility]) CM/SW [place], [place] Phone Number: 03/10/2019, 2:42 PM  Clinical Narrative:    CSW waiting for call back from [place] at [facility] for admission.  TOC Team will continue to follow for disposition planning.   Expected Discharge Plan: Skilled Nursing Facility Barriers to Discharge: (no available bed until tomorrow or Monday)  Expected Discharge Plan and Services Expected Discharge Plan: [place] In-house Referral: Clinical Social Work     Living arrangements for the past 2 months: Apartment                                       Social Determinants of Health (SDOH) Interventions    Readmission Risk Interventions Readmission Risk Prevention Plan 03/06/2019 12/30/2018 11/02/2018  Transportation Screening Complete Complete Complete  PCP or Specialist Appt within 3-5 Days - - Not Complete  Not Complete comments - - plan for SNF  HRI or [place] - - Not Complete  HRI or Home Care Consult comments - - plan for SNF  Social Work Consult for [place] Planning/Counseling - - Complete  Palliative Care Screening - - Not Applicable  Medication Review (RN Care Manager) Complete Complete Complete  PCP or Specialist appointment within 3-5 days of discharge Complete Not Complete -  PCP/Specialist Appt Not Complete comments - Patient will make own pcp appt;not ready for d/c yet. -  HRI or Home Care Consult - Complete -  SW Recovery Care/Counseling Consult Complete Complete -  Palliative Care Screening - Not Applicable -  [place] Complete Not Applicable -  Some recent data might be hidden

## 2019-03-11 ENCOUNTER — Inpatient Hospital Stay (HOSPITAL_COMMUNITY): Payer: Medicare Other

## 2019-03-11 LAB — CBC
HCT: 33.1 % — ABNORMAL LOW (ref 39.0–52.0)
Hemoglobin: 11.1 g/dL — ABNORMAL LOW (ref 13.0–17.0)
MCH: 28.5 pg (ref 26.0–34.0)
MCHC: 33.5 g/dL (ref 30.0–36.0)
MCV: 84.9 fL (ref 80.0–100.0)
Platelets: 90 10*3/uL — ABNORMAL LOW (ref 150–400)
RBC: 3.9 MIL/uL — ABNORMAL LOW (ref 4.22–5.81)
RDW: 16.9 % — ABNORMAL HIGH (ref 11.5–15.5)
WBC: 6.6 10*3/uL (ref 4.0–10.5)
nRBC: 0 % (ref 0.0–0.2)

## 2019-03-11 LAB — BASIC METABOLIC PANEL
Anion gap: 7 (ref 5–15)
BUN: 36 mg/dL — ABNORMAL HIGH (ref 8–23)
CO2: 20 mmol/L — ABNORMAL LOW (ref 22–32)
Calcium: 8.1 mg/dL — ABNORMAL LOW (ref 8.9–10.3)
Chloride: 118 mmol/L — ABNORMAL HIGH (ref 98–111)
Creatinine, Ser: 1.94 mg/dL — ABNORMAL HIGH (ref 0.61–1.24)
GFR calc Af Amer: 39 mL/min — ABNORMAL LOW (ref >60)
GFR calc non Af Amer: 34 mL/min — ABNORMAL LOW (ref >60)
Glucose, Bld: 97 mg/dL (ref 70–99)
Potassium: 4.9 mmol/L (ref 3.5–5.1)
Sodium: 145 mmol/L (ref 135–145)

## 2019-03-11 MED ORDER — COLCHICINE 0.3 MG HALF TABLET
0.3000 mg | ORAL_TABLET | Freq: Once | ORAL | Status: AC
  Administered 2019-03-11: 14:00:00 0.3 mg via ORAL
  Filled 2019-03-11: qty 1

## 2019-03-11 MED ORDER — METHYLPREDNISOLONE SODIUM SUCC 40 MG IJ SOLR
40.0000 mg | Freq: Once | INTRAMUSCULAR | Status: AC
  Administered 2019-03-11: 14:00:00 40 mg via INTRAVENOUS
  Filled 2019-03-11: qty 1

## 2019-03-11 NOTE — Progress Notes (Signed)
Attempted orthostatic vital signs as ordered. Pt BP lying 150/63 heart rate 68, sitting 164/52 heart rate 75. Attempted to stand patient with much pain in knees unable to stand long enough to obtain blood pressure, pressure took while sitting bp 163/84  Heart rate 68. Patient assisted to lie back supine and pain in knees easing off. Will continue to monitor patient. Bruce Hayes, Bettina Gavia rN
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
## 2019-03-11 NOTE — Progress Notes (Signed)
Ted hose placed as ordered. [place] Husband, Bettina Gavia RN

## 2019-03-11 NOTE — TOC Progression Note (Signed)
Transition of Care [facility]) - Progression Note    Patient Details  Name: Bruce Hayes MRN: [id number] Date of Birth: [birth year]  Transition of Care [facility]) CM/SW [place], [place] Phone Number: 03/11/2019, 11:34 AM  Clinical Narrative:     Tim Lair- reference # [id number]  Thurmond Butts, MSW, [place] Clinical Social Worker   Expected Discharge Plan: Skilled Nursing Facility Barriers to Discharge: (no available bed until tomorrow or Monday)  Expected Discharge Plan and Services Expected Discharge Plan: [place] In-house Referral: Clinical Social Work     Living arrangements for the past 2 months: Apartment                                       Social Determinants of Health (SDOH) Interventions    Readmission Risk Interventions Readmission Risk Prevention Plan 03/06/2019 12/30/2018 11/02/2018  Transportation Screening Complete Complete Complete  PCP or Specialist Appt within 3-5 Days - - Not Complete  Not Complete comments - - plan for SNF  HRI or [place] - - Not Complete  HRI or Home Care Consult comments - - plan for SNF  Social Work Consult for [place] Planning/Counseling - - Complete  Palliative Care Screening - - Not Applicable  Medication Review (RN Care Manager) Complete Complete Complete  PCP or Specialist appointment within 3-5 days of discharge Complete Not Complete -  PCP/Specialist Appt Not Complete comments - Patient will make own pcp appt;not ready for d/c yet. -  HRI or Home Care Consult - Complete -  SW Recovery Care/Counseling Consult Complete Complete -  Palliative Care Screening - Not Applicable -  [place] Complete Not Applicable -  Some recent data might be hidden

## 2019-03-11 NOTE — Progress Notes (Addendum)
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 Patient fell off the commode but was able to ambulate to bed. He, however, was found by family covered in feces. He was apparently too weak to ambulate. Per sisiter, patient had multiple falls since discharged from SNF, not able to take care of himself or perform his ADLs. Stated his weakness had significantly worsened over 3 days PTA. EMS, apparently, had been called out 3 or 4 times to get him up off the floor. ED course: Afebrile, vitals okay but found to have HGB 6.4, down from 8.5 in Jan. Hemoccult negative. Platelets 134 (up from 85 in Jan). BUN/cr 41/2.1 ( baseline creatinine ~ 1.9 to 2.0) .2u PRBC transfusion ordered in ED and TRH requested to admit patient.   Subjective:  Patient c/o knee pains when nurse tried to obtain orthostatics and was unable to bear weight. States fell on his knee before admission. Temp 100.1 this morning.   Objective: Vitals:   03/11/19 1146 03/11/19 1236 03/11/19 1439 03/11/19 1627  BP: (!) 160/65   (!) 164/69  Pulse: 73  81 80  Resp: 17  18 17   Temp: 100.1 F (37.8 C) 99.4 F (37.4 C) 99.7 F (37.6 C) 99.5 F (37.5 C)  TempSrc: Oral Oral Oral Oral  SpO2: 97%  100% 98%  Weight:      Height:        Intake/Output Summary (Last 24 hours) at 03/11/2019 1629 Last data filed at 03/11/2019 1127 Gross per 24 hour  Intake 120 ml  Output 1050 ml  Net -930 ml   Filed Weights   03/09/19 0500 03/10/19 0303 03/11/19 0519  Weight: 78.4 kg 76.6 kg 78 kg    Physical Examination:  General exam: Appears calm and comfortable   Respiratory system: Clear to auscultation. Respiratory effort normal. Cardiovascular system: S1 & S2 heard, RRR. No JVD, murmurs, rubs, gallops or clicks. No pedal edema. Gastrointestinal system: Abdomen is nondistended, soft and nontender. Normal bowel sounds heard. Central nervous system: Alert and oriented. No new focal neurological deficits. Extremities: Right knee swelling and warmth,  No contractures, edema . Decreased ROM along bilateral knees due to pain. Skin: No rashes, lesions or ulcers Psychiatry: Judgement and insight appear normal. Mood & affect appropriate.   Data Reviewed: I have personally reviewed following labs and imaging studies  CBC: Recent Labs  Lab 03/04/19 1700 03/05/19 0330 03/07/19 0224 03/08/19 0808 03/09/19 0258 03/10/19 0326 03/11/19 0255  WBC 10.3   < > 7.5 7.9 6.8 7.2 6.6  NEUTROABS 6.1  --   --  4.6  --   --   --   HGB 6.4*   < > 10.4* 10.7* 10.8* 11.2* 11.1*  HCT 18.8*   < > 29.9* 31.0* 31.8* 32.3* 33.1*  MCV 82.5   < > 82.6 81.8 83.7 83.2 84.9  PLT 134*   < > 81* 76* 77* 86* 90*   < > = values in this interval not displayed.   Basic Metabolic Panel: Recent Labs  Lab 03/07/19 0224 03/08/19 0808 03/09/19 0258 03/10/19 0326 03/11/19 0255  NA 146* 143 145 145 145  K 4.3 4.4 3.9  4.4 4.9  CL 119* 121* 120* 113* 118*  CO2 17* 15* 16* 20* 20*  GLUCOSE 96 98 103* 116* 97  BUN 31* 31* 33* 34* 36*  CREATININE 2.02* 2.06* 1.96* 1.93* 1.94*  CALCIUM 7.7* 7.8* 7.7* 8.0* 8.1*   GFR: Estimated Creatinine Clearance: 37.2 mL/min (A) (by C-G formula based on SCr of 1.94 mg/dL (H)). Liver Function Tests: Recent Labs  Lab 03/04/19 1700 03/07/19 0224 03/08/19 0808  AST 64* 51* 49*  ALT 30 23 22   ALKPHOS 106 77 76  BILITOT 1.9* 1.8* 2.0*  PROT 6.6 5.5* 5.7*  ALBUMIN 1.8* 1.4* 1.4*   Recent Labs  Lab 03/04/19 1700  LIPASE 36   No results for input(s): AMMONIA in the last 168 hours. Coagulation Profile: No results for input(s): INR,  PROTIME in the last 168 hours. Cardiac Enzymes: Recent Labs  Lab 03/04/19 1700  CKTOTAL 316   BNP (last 3 results) No results for input(s): PROBNP in the last 8760 hours. HbA1C: No results for input(s): HGBA1C in the last 72 hours. CBG: No results for input(s): GLUCAP in the last 168 hours. Lipid Profile: No results for input(s): CHOL, HDL, LDLCALC, TRIG, CHOLHDL, LDLDIRECT in the last 72 hours. Thyroid Function Tests: No results for input(s): TSH, T4TOTAL, FREET4, T3FREE, THYROIDAB in the last 72 hours. Anemia Panel: No results for input(s): VITAMINB12, FOLATE, FERRITIN, TIBC, IRON, RETICCTPCT in the last 72 hours. Sepsis Labs: Recent Labs  Lab 03/05/19 1351 03/05/19 1620  LATICACIDVEN 2.9* 2.0*    Recent Results (from the past 240 hour(s))  Urine culture     Status: Abnormal   Collection Time: 03/04/19  6:14 PM   Specimen: Urine, Random  Result Value Ref Range Status   Specimen Description URINE, RANDOM  Final   Special Requests NONE  Final   Culture (A)  Final    <10,000 COLONIES/mL INSIGNIFICANT GROWTH Performed at [facility], [place] [address], [place], [place] [address]    Report Status 03/05/2019 FINAL  Final  SARS CORONAVIRUS 2 (TAT 6-24 HRS) Nasopharyngeal Nasopharyngeal Swab     Status: None   Collection Time: 03/04/19  7:21 PM   Specimen: Nasopharyngeal Swab  Result Value Ref Range Status   SARS Coronavirus 2 NEGATIVE NEGATIVE Final    Comment: (NOTE) SARS-CoV-2 target nucleic acids are NOT DETECTED. The SARS-CoV-2 RNA is generally detectable in upper and lower respiratory specimens during the acute phase of infection. Negative results do not preclude SARS-CoV-2 infection, do not rule out co-infections with other pathogens, and should not be used as the sole basis for treatment or other patient management decisions. Negative results must be combined with clinical observations, patient history, and epidemiological information. The expected result is  Negative. Fact Sheet for Patients: SugarRoll.be Fact Sheet for Healthcare Providers: https://www.woods-mathews.com/ This test is not yet approved or cleared by the [place] FDA and  has been authorized for detection and/or diagnosis of SARS-CoV-2 by FDA under an Emergency Use Authorization (EUA). This EUA will remain  in effect (meaning this test can be used) for the duration of the COVID-19 declaration under Section 56 4(b)(1) of the Act, 21 U.S.C. section 360bbb-3(b)(1), unless the authorization is terminated or revoked sooner. Performed at [facility], [place] [address] [place], [place] [address]   SARS CORONAVIRUS 2 (TAT 6-24 HRS) Nasopharyngeal Nasopharyngeal Swab     Status: None   Collection Time: 03/08/19 12:20 PM   Specimen: Nasopharyngeal Swab  Result Value Ref Range Status  SARS Coronavirus 2 NEGATIVE NEGATIVE Final    Comment: (NOTE) SARS-CoV-2 target nucleic acids are NOT DETECTED. The SARS-CoV-2 RNA is generally detectable in upper and lower respiratory specimens during the acute phase of infection. Negative results do not preclude SARS-CoV-2 infection, do not rule out co-infections with other pathogens, and should not be used as the sole basis for treatment or other patient management decisions. Negative results must be combined with clinical observations, patient history, and epidemiological information. The expected result is Negative. Fact Sheet for Patients: SugarRoll.be Fact Sheet for Healthcare Providers: https://www.woods-mathews.com/ This test is not yet approved or cleared by the [place] FDA and  has been authorized for detection and/or diagnosis of SARS-CoV-2 by FDA under an Emergency Use Authorization (EUA). This EUA will remain  in effect (meaning this test can be used) for the duration of the COVID-19 declaration under Section 56 4(b)(1) of the Act, 21  U.S.C. section 360bbb-3(b)(1), unless the authorization is terminated or revoked sooner. Performed at [facility], [place] [address], [place], [place] [address]       Radiology Studies: DG Knee 1-2 Views Left  Result Date: 03/11/2019 CLINICAL DATA:  Bilateral knee pain. EXAM: LEFT KNEE - 1-2 VIEW COMPARISON:  None. FINDINGS: No evidence of fracture, dislocation, or joint effusion. No evidence of arthropathy. Mild patellar spurring is noted. Soft tissues are unremarkable. IMPRESSION: Mild patellar spurring. No acute abnormality seen in the left knee. Electronically Signed   By: Marijo Conception M.D.   On: 03/11/2019 14:00   DG Knee 1-2 Views Right  Result Date: 03/11/2019 CLINICAL DATA:  Bilateral knee pain. EXAM: RIGHT KNEE - 1-2 VIEW COMPARISON:  None. FINDINGS: No evidence of fracture, dislocation, or joint effusion. No evidence of arthropathy or other focal bone abnormality. Soft tissues are unremarkable. IMPRESSION: Negative. Electronically Signed   By: Marijo Conception M.D.   On: 03/11/2019 13:59        Scheduled Meds: . amLODipine  5 mg Oral Daily  . carvedilol  3.125 mg Oral BID WC  . cloNIDine  0.3 mg Transdermal Weekly  . feeding supplement (ENSURE ENLIVE)  237 mL Oral BID BM  . feeding supplement (PRO-STAT SUGAR FREE 64)  30 mL Oral BID  . folic acid  1 mg Oral Daily  . lactulose  20 g Oral BID  . levothyroxine  50 mcg Oral QAC breakfast  . pantoprazole  40 mg Oral BID  . sertraline  25 mg Oral Daily  . sodium bicarbonate  650 mg Oral Daily   Continuous Infusions:  Assessment & Plan:   1. Symptomatic acute of chronic blood loss anemia: Patient has h/o liver cirrhosis with consequent esophageal varices, portal hypertensive gastropathy and thrombocytopenia, which are all presumably contributing to chronic GI losses. FOBT x 1 however was negative.He may have chronic BMS as well. He does have h/o Hep C/hep B. Last EGD in 2018 revealed gastritis,mild portal gastropathy  but no evidence of esophageal or gastric varices.Presenting hemoglobin 6.4. Received 2 units of blood transfusion. Posttransfusion hemoglobin been stable so far. He should f/u GI as outpatient for further w/u as warranted.   2. 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 Resume home meds including Lactulose. No on diuretics  3.Generalized weakness/frequent falls:Patient was discharged about 10 days PTA from [facility]. He still feels significantly weak and reportedly has had several falls since discharge from there. CT head, Cspine and CT  pelvis negative for acute fractures or any other acute abnormality. Post transfusion feels somewhat better but PT eval still recommended SNF given generalized weakness, deconditioning. Orthostatics could not be completed today as patient could not stand with knee pain. Will obtain uric acid level,  knee X rays given falls and ordered 1 dose of IV steroid for possible gouty flare given swelling/fever (gives h/o gout but not on any meds at home)  4. Essential hypertension: Resumed home medications carvedilol, norvasc and clonidine patch while here and SBP atable around 140s  to 150s on supine/sitting positions (orthostatics incomplete). Continue as needed hydralazine.  5.CKD stage IIIb/Non-anion gap metabolic acidosis: Creatinine been at baseline and stable around 1.9 to 2.0. Acidosis improved with IV fluids and on oral bicarb.   6. H/o Hep C and secondary syphilis : s/p treatment. No acute issues currently  7.Dysphagia : MBS completed .On dys II diet per speech therapy recommendations. Outpatient speech therapy f/u at SNF  8. Hypothyroidism: on synthroid. TSH nl in Dec/2020  9. Right buttock pressure injury/left buttock stage II pressure ulcer/left heel deep tissue injury, present on admission: Seen by Wound care while here and to be followed up at SNF.Continue local care.   10. Severe  protein -calorie malnutrition with hypoalbuminemia (<2.0) --on protein supplements   DVT prophylaxis: SCD, add Ted Hose Code Status: Full Family / Patient Communication: d/w patient Disposition Plan: SNF in am if remains afebrile and x rays -ve.   LOS: 5 days    Time spent: 35 mins    Guilford Shi, MD [facility] Pager in [place]  If 7PM-7AM, please contact night-coverage [email or web address] Password [facility] 03/11/2019, 4:29 PM

## 2019-03-11 NOTE — Progress Notes (Signed)
Dr. Earnest Conroy made aware patient temp 100.1 and then 99.4 also made aware of patient pain in knees and attempts at orthostatic vital signs this AM. Orders received. Will monitor patient. Drinda Belgard, Bettina Gavia RN

## 2019-03-11 NOTE — Plan of Care (Signed)

## 2019-03-11 NOTE — TOC Progression Note (Signed)
Transition of Care [facility]) - Progression Note    Patient Details  Name: Bruce Hayes MRN: [id number] Date of Birth: 10-07-1945  Transition of Care [facility]) CM/SW [place], [place] Phone Number: 03/11/2019, 3:45 PM  Clinical Narrative:     Per RN patient had temp of 100.1 per SNF patient will need to be fever free for 24 hrs.    Received insurance Josem Kaufmann [id number] form 2/10-2/12 next review date is 2/12.   CSW will continue to follow for discharge readiness.   Thurmond Butts, MSW, [place] Clinical Social Worker    Expected Discharge Plan: Skilled Nursing Facility Barriers to Discharge: (no available bed until tomorrow or Monday)  Expected Discharge Plan and Services Expected Discharge Plan: [place] In-house Referral: Clinical Social Work     Living arrangements for the past 2 months: Apartment                                       Social Determinants of Health (SDOH) Interventions    Readmission Risk Interventions Readmission Risk Prevention Plan 03/06/2019 12/30/2018 11/02/2018  Transportation Screening Complete Complete Complete  PCP or Specialist Appt within 3-5 Days - - Not Complete  Not Complete comments - - plan for SNF  HRI or [place] - - Not Complete  HRI or Home Care Consult comments - - plan for SNF  Social Work Consult for [place] Planning/Counseling - - Complete  Palliative Care Screening - - Not Applicable  Medication Review (RN Care Manager) Complete Complete Complete  PCP or Specialist appointment within 3-5 days of discharge Complete Not Complete -  PCP/Specialist Appt Not Complete comments - Patient will make own pcp appt;not ready for d/c yet. -  HRI or Home Care Consult - Complete -  SW Recovery Care/Counseling Consult Complete Complete -  Palliative Care Screening - Not Applicable -  [place] Complete Not Applicable -  Some recent data might be hidden

## 2019-03-12 LAB — CBC
HCT: 31.7 % — ABNORMAL LOW (ref 39.0–52.0)
Hemoglobin: 10.4 g/dL — ABNORMAL LOW (ref 13.0–17.0)
MCH: 28.3 pg (ref 26.0–34.0)
MCHC: 32.8 g/dL (ref 30.0–36.0)
MCV: 86.1 fL (ref 80.0–100.0)
Platelets: 92 10*3/uL — ABNORMAL LOW (ref 150–400)
RBC: 3.68 MIL/uL — ABNORMAL LOW (ref 4.22–5.81)
RDW: 16.5 % — ABNORMAL HIGH (ref 11.5–15.5)
WBC: 4.9 10*3/uL (ref 4.0–10.5)
nRBC: 0 % (ref 0.0–0.2)

## 2019-03-12 LAB — BASIC METABOLIC PANEL
Anion gap: 7 (ref 5–15)
BUN: 45 mg/dL — ABNORMAL HIGH (ref 8–23)
CO2: 18 mmol/L — ABNORMAL LOW (ref 22–32)
Calcium: 7.9 mg/dL — ABNORMAL LOW (ref 8.9–10.3)
Chloride: 119 mmol/L — ABNORMAL HIGH (ref 98–111)
Creatinine, Ser: 2.15 mg/dL — ABNORMAL HIGH (ref 0.61–1.24)
GFR calc Af Amer: 35 mL/min — ABNORMAL LOW (ref >60)
GFR calc non Af Amer: 30 mL/min — ABNORMAL LOW (ref >60)
Glucose, Bld: 143 mg/dL — ABNORMAL HIGH (ref 70–99)
Potassium: 4.8 mmol/L (ref 3.5–5.1)
Sodium: 144 mmol/L (ref 135–145)

## 2019-03-12 LAB — URIC ACID: Uric Acid, Serum: 10.2 mg/dL — ABNORMAL HIGH (ref 3.7–8.6)

## 2019-03-12 MED ORDER — SODIUM BICARBONATE 650 MG PO TABS: 650.0000 mg | ORAL_TABLET | Freq: Two times a day (BID) | ORAL | 0 refills | Status: AC

## 2019-03-12 MED ORDER — PREDNISONE 20 MG PO TABS: 40.0000 mg | ORAL_TABLET | Freq: Every day | ORAL | 0 refills | Status: AC

## 2019-03-12 MED ORDER — ONDANSETRON HCL 4 MG PO TABS: 4.0000 mg | ORAL_TABLET | Freq: Four times a day (QID) | ORAL | 0 refills | Status: AC | PRN

## 2019-03-12 MED ORDER — PANTOPRAZOLE SODIUM 40 MG PO TBEC: 40.0000 mg | DELAYED_RELEASE_TABLET | Freq: Two times a day (BID) | ORAL | 0 refills | Status: AC

## 2019-03-12 MED ORDER — AMLODIPINE BESYLATE 5 MG PO TABS: 5.0000 mg | ORAL_TABLET | Freq: Every day | ORAL | 0 refills | Status: AC

## 2019-03-12 MED ORDER — MUSCLE RUB 10-15 % EX CREA: 1.0000 "application " | TOPICAL_CREAM | CUTANEOUS | 0 refills | Status: AC | PRN

## 2019-03-12 NOTE — Discharge Summary (Signed)
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 3.9 4.4 4.9 4.8  CL 121* 120* 113* 118* 119*  CO2 15* 16* 20* 20* 18*  GLUCOSE 98 103* 116* 97 143*  BUN 31* 33* 34* 36* 45*  CREATININE 2.06* 1.96* 1.93* 1.94* 2.15*  CALCIUM 7.8* 7.7* 8.0* 8.1* 7.9*   Liver Function Tests: Recent Labs  Lab 03/07/19 0224 03/08/19 0808  AST 51* 49*  ALT 23 22  ALKPHOS 77 76  BILITOT 1.8* 2.0*  PROT 5.5* 5.7*  ALBUMIN 1.4* 1.4*   No results for input(s): LIPASE, AMYLASE in the last 168 hours. No results for input(s): AMMONIA in the last 168 hours. CBC: Recent Labs  Lab 03/08/19 0808 03/09/19 0258 03/10/19 0326 03/11/19 0255 03/12/19 0204  WBC 7.9 6.8 7.2 6.6 4.9  NEUTROABS 4.6  --   --   --   --   HGB 10.7* 10.8* 11.2* 11.1* 10.4*  HCT 31.0* 31.8* 32.3* 33.1* 31.7*  MCV 81.8 83.7 83.2 84.9 86.1  PLT 76* 77* 86* 90* 92*   Cardiac Enzymes: No results for input(s): CKTOTAL, CKMB, CKMBINDEX, TROPONINI in the last  168 hours. BNP: Invalid input(s): POCBNP CBG: No results for input(s): GLUCAP in the last 168 hours. D-Dimer No results for input(s): DDIMER in the last 72 hours. Hgb A1c No results for input(s): HGBA1C in the last 72 hours. Lipid Profile No results for input(s): CHOL, HDL, LDLCALC, TRIG, CHOLHDL, LDLDIRECT in the last 72 hours. Thyroid function studies No results for input(s): TSH, T4TOTAL, T3FREE, THYROIDAB in the last 72 hours.  Invalid input(s): FREET3 Anemia work up No results for input(s): VITAMINB12, FOLATE, FERRITIN, TIBC, IRON, RETICCTPCT in the last 72 hours. Urinalysis    Component Value Date/Time   COLORURINE YELLOW 03/04/2019 1810   APPEARANCEUR CLEAR 03/04/2019 1810   LABSPEC 1.015 03/04/2019 1810   PHURINE 5.0 03/04/2019 1810   GLUCOSEU NEGATIVE 03/04/2019 1810   HGBUR NEGATIVE 03/04/2019 1810   BILIRUBINUR NEGATIVE 03/04/2019 1810   KETONESUR NEGATIVE 03/04/2019 1810   PROTEINUR NEGATIVE 03/04/2019 1810   UROBILINOGEN 4.0 (H) 03/31/2014 0920   NITRITE NEGATIVE 03/04/2019 1810   LEUKOCYTESUR TRACE (A) 03/04/2019 1810   Sepsis Labs Invalid input(s): PROCALCITONIN,  WBC,  LACTICIDVEN Microbiology Recent Results (from the past 240 hour(s))  Urine culture  Status: Abnormal   Collection Time: 03/04/19  6:14 PM   Specimen: Urine, Random  Result Value Ref Range Status   Specimen Description URINE, RANDOM  Final   Special Requests NONE  Final   Culture (A)  Final    <10,000 COLONIES/mL INSIGNIFICANT GROWTH Performed at [facility], [address] [place], [place] [address]    Report Status 03/05/2019 FINAL  Final  SARS CORONAVIRUS 2 (TAT 6-24 HRS) Nasopharyngeal Nasopharyngeal Swab     Status: None   Collection Time: 03/04/19  7:21 PM   Specimen: Nasopharyngeal Swab  Result Value Ref Range Status   SARS Coronavirus 2 NEGATIVE NEGATIVE Final    Comment: (NOTE) SARS-CoV-2 target nucleic acids are NOT DETECTED. The SARS-CoV-2 RNA is generally  detectable in upper and lower respiratory specimens during the acute phase of infection. Negative results do not preclude SARS-CoV-2 infection, do not rule out co-infections with other pathogens, and should not be used as the sole basis for treatment or other patient management decisions. Negative results must be combined with clinical observations, patient history, and epidemiological information. The expected result is Negative. Fact Sheet for Patients: SugarRoll.be Fact Sheet for Healthcare Providers: https://www.woods-mathews.com/ This test is not yet approved or cleared by the [place] FDA and  has been authorized for detection and/or diagnosis of SARS-CoV-2 by FDA under an Emergency Use Authorization (EUA). This EUA will remain  in effect (meaning this test can be used) for the duration of the COVID-19 declaration under Section 56 4(b)(1) of the Act, 21 U.S.C. section 360bbb-3(b)(1), unless the authorization is terminated or revoked sooner. Performed at [facility], [place] [address] [place], [place] [address]   SARS CORONAVIRUS 2 (TAT 6-24 HRS) Nasopharyngeal Nasopharyngeal Swab     Status: None   Collection Time: 03/08/19 12:20 PM   Specimen: Nasopharyngeal Swab  Result Value Ref Range Status   SARS Coronavirus 2 NEGATIVE NEGATIVE Final    Comment: (NOTE) SARS-CoV-2 target nucleic acids are NOT DETECTED. The SARS-CoV-2 RNA is generally detectable in upper and lower respiratory specimens during the acute phase of infection. Negative results do not preclude SARS-CoV-2 infection, do not rule out co-infections with other pathogens, and should not be used as the sole basis for treatment or other patient management decisions. Negative results must be combined with clinical observations, patient history, and epidemiological information. The expected result is Negative. Fact Sheet for  Patients: SugarRoll.be Fact Sheet for Healthcare Providers: https://www.woods-mathews.com/ This test is not yet approved or cleared by the [place] FDA and  has been authorized for detection and/or diagnosis of SARS-CoV-2 by FDA under an Emergency Use Authorization (EUA). This EUA will remain  in effect (meaning this test can be used) for the duration of the COVID-19 declaration under Section 56 4(b)(1) of the Act, 21 U.S.C. section 360bbb-3(b)(1), unless the authorization is terminated or revoked sooner. Performed at [facility], [place] [address], [place],  [address]      Time coordinating discharge: 35 minutes  SIGNED:   Aline August, MD  [facility] 03/12/2019, 10:03 AM

## 2019-03-12 NOTE — Progress Notes (Signed)
Called RN Jannet Askew at [facility] and gave report. All questions answered. Patient transported via [place].

## 2019-03-12 NOTE — TOC Transition Note (Signed)
Transition of Care [facility]) - CM/SW Discharge Note   Patient Details  Name: VANSH RECKART MRN: [id number] Date of Birth: 05/28/1945  [facility] CM/SW Contact:  Vinie Sill, [place] Phone Number: 03/12/2019, 11:51 AM   Clinical Narrative:     Patient will DC to: [place] Date: 03/22/2019 Family Notified: [name], sister Transport By: Corey Harold  RN, patient, and facility notified of DC. Discharge Summary sent to facility. RN given number for [phone]. Ambulance transport requested for patient.   Clinical Social Worker signing off.  Thurmond Butts, MSW, [place] Clinical Social Worker     Final next level of care: Skilled Nursing Facility Barriers to Discharge: (no available bed until tomorrow or Monday)   Patient Goals and CMS Choice        Discharge Placement PASRR number recieved: 03/08/19            Patient chooses bed at: [facility] Patient to be transferred to facility by: [place] Name of family member notified: [name], sister Patient and family notified of of transfer: 03/12/19  Discharge Plan and Services In-house Referral: Clinical Social Work                                   Social Determinants of Health (SDOH) Interventions     Readmission Risk Interventions Readmission Risk Prevention Plan 03/06/2019 12/30/2018 11/02/2018  Transportation Screening Complete Complete Complete  PCP or Specialist Appt within 3-5 Days - - Not Complete  Not Complete comments - - plan for SNF  HRI or [place] - - Not Complete  HRI or Home Care Consult comments - - plan for SNF  Social Work Consult for [place] Planning/Counseling - - Complete  Palliative Care Screening - - Not Applicable  Medication Review (RN Care Manager) Complete Complete Complete  PCP or Specialist appointment within 3-5 days of discharge Complete Not Complete -  PCP/Specialist Appt Not Complete comments - Patient will make own pcp appt;not ready  for d/c yet. -  HRI or Home Care Consult - Complete -  SW Recovery Care/Counseling Consult Complete Complete -  Palliative Care Screening - Not Applicable -  [place] Complete Not Applicable -  Some recent data might be hidden

## 2019-03-12 NOTE — Progress Notes (Signed)
  Speech Language Pathology Treatment: Dysphagia  Patient Details Name: Bruce Hayes MRN: [id number] DOB: 04-16-1945 Today's Date: 03/12/2019 Time: 0912-0929 SLP Time Calculation (min) (ACUTE ONLY): 17 min  Assessment / Plan / Recommendation Clinical Impression  Reviewed MBS results from last week and recommendations. Dentures were supposed to be brought by sister but have not arrived as of yet. He reports sister is planning to come today with dentures (?). His mastication of regular texture graham cracker was appropriate, timely without oral residue. Straw sips liquid trials unremarkable for subjective abnormality. Discussed upgrading to Dys 3 (mechanical soft) and he wants to try this texture. He was independent in controlling his rate and volume adequately. Will check in another time for use of dentures and potential upgrade for regular.    HPI HPI: 74 y.o. male with past medical history significant for CKD, alcohol abuse, polysubstance abuse, CAD, depression, hepatitis C, hypothyroidism, treated secondary syphilis who presents 2/1 for evaluation of fall.  Recent hospital admission 12/25-1/1 for AKI, hyperkalemia and sepsis.  Patient was discharged to SNF, then D/Cd home approximately ten days before this admission. Since arrival home he has had multiple falls, EMS called 3-4 times to get him off the floor. Sister found him soiled in feces day of this admission. Pt was scheduled for an OP MBS for February 3.        SLP Plan  Continue with current plan of care       Recommendations  Diet recommendations: Dysphagia 3 (mechanical soft);Thin liquid Liquids provided via: Straw;Cup Medication Administration: Whole meds with liquid Supervision: Patient able to self feed Compensations: Slow rate Postural Changes and/or Swallow Maneuvers: Seated upright 90 degrees                Oral Care Recommendations: Oral care BID Follow up Recommendations: 24 hour supervision/assistance SLP  Visit Diagnosis: Dysphagia, oropharyngeal phase (R13.12) Plan: Continue with current plan of care       GO                Houston Siren 03/12/2019, 9:39 AM

## 2019-03-19 ENCOUNTER — Encounter: Payer: Self-pay | Admitting: Internal Medicine

## 2019-09-19 ENCOUNTER — Other Ambulatory Visit: Payer: Self-pay

## 2019-09-19 ENCOUNTER — Non-Acute Institutional Stay: Payer: Medicare Other | Admitting: Hospice

## 2019-09-19 DIAGNOSIS — Z515 Encounter for palliative care: Secondary | ICD-10-CM
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
## 2019-10-24 ENCOUNTER — Other Ambulatory Visit: Payer: Self-pay

## 2019-10-24 ENCOUNTER — Non-Acute Institutional Stay: Payer: Medicare Other | Admitting: Hospice

## 2019-10-24 DIAGNOSIS — K7469 Other cirrhosis of liver: Secondary | ICD-10-CM

## 2019-10-24 DIAGNOSIS — Z515 Encounter for palliative care: Secondary | ICD-10-CM
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
## 2019-12-30 ENCOUNTER — Non-Acute Institutional Stay: Payer: Medicare Other | Admitting: Hospice

## 2019-12-30 ENCOUNTER — Other Ambulatory Visit: Payer: Self-pay

## 2019-12-30 DIAGNOSIS — K7469 Other cirrhosis of liver: Secondary | ICD-10-CM

## 2019-12-30 DIAGNOSIS — Z515 Encounter for palliative care: Secondary | ICD-10-CM
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
## 2020-04-01 ENCOUNTER — Emergency Department (HOSPITAL_COMMUNITY): Payer: Medicare Other

## 2020-04-01 ENCOUNTER — Encounter (HOSPITAL_COMMUNITY): Payer: Self-pay | Admitting: Internal Medicine

## 2020-04-01 ENCOUNTER — Inpatient Hospital Stay (HOSPITAL_COMMUNITY)
Admission: EM | Admit: 2020-04-01 | Discharge: 2020-04-05 | DRG: 441 | Disposition: A | Discharge status: Skilled Nursing Facility | Payer: Medicare Other | Source: Skilled Nursing Facility | Attending: Internal Medicine | Admitting: Internal Medicine

## 2020-04-01 DIAGNOSIS — N189 Chronic kidney disease, unspecified: Secondary | ICD-10-CM | POA: Diagnosis present

## 2020-04-01 DIAGNOSIS — F1410 Cocaine abuse, uncomplicated: Secondary | ICD-10-CM | POA: Diagnosis present

## 2020-04-01 DIAGNOSIS — T502X5A Adverse effect of carbonic-anhydrase inhibitors, benzothiadiazides and other diuretics, initial encounter: Secondary | ICD-10-CM | POA: Diagnosis present

## 2020-04-01 DIAGNOSIS — I10 Essential (primary) hypertension: Secondary | ICD-10-CM | POA: Diagnosis present

## 2020-04-01 DIAGNOSIS — R197 Diarrhea, unspecified: Secondary | ICD-10-CM | POA: Diagnosis present

## 2020-04-01 DIAGNOSIS — K7290 Hepatic failure, unspecified without coma: Secondary | ICD-10-CM

## 2020-04-01 DIAGNOSIS — R823 Hemoglobinuria: Secondary | ICD-10-CM | POA: Diagnosis present

## 2020-04-01 DIAGNOSIS — D696 Thrombocytopenia, unspecified: Secondary | ICD-10-CM | POA: Diagnosis present

## 2020-04-01 DIAGNOSIS — F1010 Alcohol abuse, uncomplicated: Secondary | ICD-10-CM | POA: Diagnosis present

## 2020-04-01 DIAGNOSIS — Z886 Allergy status to analgesic agent status: Secondary | ICD-10-CM

## 2020-04-01 DIAGNOSIS — Z79890 Hormone replacement therapy (postmenopausal): Secondary | ICD-10-CM

## 2020-04-01 DIAGNOSIS — N179 Acute kidney failure, unspecified: Secondary | ICD-10-CM | POA: Diagnosis present

## 2020-04-01 DIAGNOSIS — D6959 Other secondary thrombocytopenia: Secondary | ICD-10-CM | POA: Diagnosis present

## 2020-04-01 DIAGNOSIS — R809 Proteinuria, unspecified: Secondary | ICD-10-CM | POA: Diagnosis present

## 2020-04-01 DIAGNOSIS — N1832 Chronic kidney disease, stage 3b: Secondary | ICD-10-CM | POA: Diagnosis present

## 2020-04-01 DIAGNOSIS — F17210 Nicotine dependence, cigarettes, uncomplicated: Secondary | ICD-10-CM | POA: Diagnosis present

## 2020-04-01 DIAGNOSIS — E039 Hypothyroidism, unspecified: Secondary | ICD-10-CM | POA: Diagnosis present

## 2020-04-01 DIAGNOSIS — R059 Cough, unspecified: Secondary | ICD-10-CM | POA: Diagnosis present

## 2020-04-01 DIAGNOSIS — B964 Proteus (mirabilis) (morganii) as the cause of diseases classified elsewhere: Secondary | ICD-10-CM | POA: Diagnosis present

## 2020-04-01 DIAGNOSIS — E785 Hyperlipidemia, unspecified: Secondary | ICD-10-CM | POA: Diagnosis present

## 2020-04-01 DIAGNOSIS — Z800 Family history of malignant neoplasm of digestive organs: Secondary | ICD-10-CM

## 2020-04-01 DIAGNOSIS — Z6825 Body mass index (BMI) 25.0-25.9, adult: Secondary | ICD-10-CM

## 2020-04-01 DIAGNOSIS — M109 Gout, unspecified: Secondary | ICD-10-CM | POA: Diagnosis present

## 2020-04-01 DIAGNOSIS — F32A Depression, unspecified: Secondary | ICD-10-CM | POA: Diagnosis present

## 2020-04-01 DIAGNOSIS — Z8249 Family history of ischemic heart disease and other diseases of the circulatory system: Secondary | ICD-10-CM

## 2020-04-01 DIAGNOSIS — Z79899 Other long term (current) drug therapy: Secondary | ICD-10-CM

## 2020-04-01 DIAGNOSIS — Z20822 Contact with and (suspected) exposure to covid-19: Secondary | ICD-10-CM | POA: Diagnosis present

## 2020-04-01 DIAGNOSIS — B1920 Unspecified viral hepatitis C without hepatic coma: Secondary | ICD-10-CM | POA: Diagnosis present

## 2020-04-01 DIAGNOSIS — Z8261 Family history of arthritis: Secondary | ICD-10-CM

## 2020-04-01 DIAGNOSIS — K7200 Acute and subacute hepatic failure without coma: Secondary | ICD-10-CM | POA: Diagnosis not present

## 2020-04-01 DIAGNOSIS — K7460 Unspecified cirrhosis of liver: Secondary | ICD-10-CM | POA: Diagnosis present

## 2020-04-01 DIAGNOSIS — N390 Urinary tract infection, site not specified: Secondary | ICD-10-CM | POA: Diagnosis present

## 2020-04-01 DIAGNOSIS — Z66 Do not resuscitate: Secondary | ICD-10-CM | POA: Diagnosis present

## 2020-04-01 DIAGNOSIS — I129 Hypertensive chronic kidney disease with stage 1 through stage 4 chronic kidney disease, or unspecified chronic kidney disease: Secondary | ICD-10-CM | POA: Diagnosis present

## 2020-04-01 DIAGNOSIS — I2510 Atherosclerotic heart disease of native coronary artery without angina pectoris: Secondary | ICD-10-CM | POA: Diagnosis present

## 2020-04-01 DIAGNOSIS — Z825 Family history of asthma and other chronic lower respiratory diseases: Secondary | ICD-10-CM

## 2020-04-01 DIAGNOSIS — H269 Unspecified cataract: Secondary | ICD-10-CM | POA: Diagnosis present

## 2020-04-01 DIAGNOSIS — R600 Localized edema: Secondary | ICD-10-CM | POA: Diagnosis present

## 2020-04-01 DIAGNOSIS — E43 Unspecified severe protein-calorie malnutrition: Secondary | ICD-10-CM | POA: Diagnosis present

## 2020-04-01 DIAGNOSIS — K7682 Hepatic encephalopathy: Secondary | ICD-10-CM | POA: Diagnosis present

## 2020-04-01 DIAGNOSIS — W3400XS Accidental discharge from unspecified firearms or gun, sequela: Secondary | ICD-10-CM

## 2020-04-01 DIAGNOSIS — E872 Acidosis: Secondary | ICD-10-CM | POA: Diagnosis present

## 2020-04-01 DIAGNOSIS — E875 Hyperkalemia: Principal | ICD-10-CM | POA: Diagnosis present

## 2020-04-01 DIAGNOSIS — E440 Moderate protein-calorie malnutrition: Secondary | ICD-10-CM | POA: Insufficient documentation

## 2020-04-01 LAB — URINALYSIS, ROUTINE W REFLEX MICROSCOPIC
Bilirubin Urine: NEGATIVE
Glucose, UA: NEGATIVE mg/dL
Ketones, ur: NEGATIVE mg/dL
Nitrite: NEGATIVE
Protein, ur: 100 mg/dL — AB
Specific Gravity, Urine: 1.013 (ref 1.005–1.030)
pH: 7 (ref 5.0–8.0)

## 2020-04-01 LAB — CBC WITH DIFFERENTIAL/PLATELET
Abs Immature Granulocytes: 0.02 10*3/uL (ref 0.00–0.07)
Basophils Absolute: 0 10*3/uL (ref 0.0–0.1)
Basophils Relative: 0 %
Eosinophils Absolute: 0.1 10*3/uL (ref 0.0–0.5)
Eosinophils Relative: 3 %
HCT: 27.4 % — ABNORMAL LOW (ref 39.0–52.0)
Hemoglobin: 9.7 g/dL — ABNORMAL LOW (ref 13.0–17.0)
Immature Granulocytes: 0 %
Lymphocytes Relative: 28 %
Lymphs Abs: 1.4 10*3/uL (ref 0.7–4.0)
MCH: 30.7 pg (ref 26.0–34.0)
MCHC: 35.4 g/dL (ref 30.0–36.0)
MCV: 86.7 fL (ref 80.0–100.0)
Monocytes Absolute: 0.9 10*3/uL (ref 0.1–1.0)
Monocytes Relative: 19 %
Neutro Abs: 2.4 10*3/uL (ref 1.7–7.7)
Neutrophils Relative %: 50 %
Platelets: 70 10*3/uL — ABNORMAL LOW (ref 150–400)
RBC: 3.16 MIL/uL — ABNORMAL LOW (ref 4.22–5.81)
RDW: 15.8 % — ABNORMAL HIGH (ref 11.5–15.5)
WBC: 4.9 10*3/uL (ref 4.0–10.5)
nRBC: 0 % (ref 0.0–0.2)

## 2020-04-01 LAB — COMPREHENSIVE METABOLIC PANEL
ALT: 35 U/L (ref 0–44)
ALT: 30 U/L (ref 0–44)
AST: 59 U/L — ABNORMAL HIGH (ref 15–41)
AST: 52 U/L — ABNORMAL HIGH (ref 15–41)
Albumin: 1.9 g/dL — ABNORMAL LOW (ref 3.5–5.0)
Albumin: 1.8 g/dL — ABNORMAL LOW (ref 3.5–5.0)
Alkaline Phosphatase: 144 U/L — ABNORMAL HIGH (ref 38–126)
Alkaline Phosphatase: 138 U/L — ABNORMAL HIGH (ref 38–126)
Anion gap: 6 (ref 5–15)
Anion gap: 7 (ref 5–15)
BUN: 25 mg/dL — ABNORMAL HIGH (ref 8–23)
BUN: 33 mg/dL — ABNORMAL HIGH (ref 8–23)
CO2: 14 mmol/L — ABNORMAL LOW (ref 22–32)
CO2: 13 mmol/L — ABNORMAL LOW (ref 22–32)
Calcium: 7.6 mg/dL — ABNORMAL LOW (ref 8.9–10.3)
Calcium: 7.3 mg/dL — ABNORMAL LOW (ref 8.9–10.3)
Chloride: 124 mmol/L — ABNORMAL HIGH (ref 98–111)
Chloride: 120 mmol/L — ABNORMAL HIGH (ref 98–111)
Creatinine, Ser: 2.33 mg/dL — ABNORMAL HIGH (ref 0.61–1.24)
Creatinine, Ser: 2.4 mg/dL — ABNORMAL HIGH (ref 0.61–1.24)
GFR, Estimated: 29 mL/min — ABNORMAL LOW (ref >60)
GFR, Estimated: 28 mL/min — ABNORMAL LOW (ref >60)
Glucose, Bld: 101 mg/dL — ABNORMAL HIGH (ref 70–99)
Glucose, Bld: 100 mg/dL — ABNORMAL HIGH (ref 70–99)
Potassium: 5.7 mmol/L — ABNORMAL HIGH (ref 3.5–5.1)
Potassium: 5.8 mmol/L — ABNORMAL HIGH (ref 3.5–5.1)
Sodium: 144 mmol/L (ref 135–145)
Sodium: 140 mmol/L (ref 135–145)
Total Bilirubin: 1.3 mg/dL — ABNORMAL HIGH (ref 0.3–1.2)
Total Bilirubin: 1.4 mg/dL — ABNORMAL HIGH (ref 0.3–1.2)
Total Protein: 6.4 g/dL — ABNORMAL LOW (ref 6.5–8.1)
Total Protein: 5.7 g/dL — ABNORMAL LOW (ref 6.5–8.1)

## 2020-04-01 LAB — CBC
HCT: 27 % — ABNORMAL LOW (ref 39.0–52.0)
HCT: 26.7 % — ABNORMAL LOW (ref 39.0–52.0)
Hemoglobin: 9.4 g/dL — ABNORMAL LOW (ref 13.0–17.0)
Hemoglobin: 8.9 g/dL — ABNORMAL LOW (ref 13.0–17.0)
MCH: 30.3 pg (ref 26.0–34.0)
MCH: 29.2 pg (ref 26.0–34.0)
MCHC: 34.8 g/dL (ref 30.0–36.0)
MCHC: 33.3 g/dL (ref 30.0–36.0)
MCV: 87.1 fL (ref 80.0–100.0)
MCV: 87.5 fL (ref 80.0–100.0)
Platelets: 68 10*3/uL — ABNORMAL LOW (ref 150–400)
Platelets: 64 10*3/uL — ABNORMAL LOW (ref 150–400)
RBC: 3.1 MIL/uL — ABNORMAL LOW (ref 4.22–5.81)
RBC: 3.05 MIL/uL — ABNORMAL LOW (ref 4.22–5.81)
RDW: 15.9 % — ABNORMAL HIGH (ref 11.5–15.5)
RDW: 15.5 % (ref 11.5–15.5)
WBC: 5.3 10*3/uL (ref 4.0–10.5)
WBC: 4.4 10*3/uL (ref 4.0–10.5)
nRBC: 0 % (ref 0.0–0.2)
nRBC: 0 % (ref 0.0–0.2)

## 2020-04-01 LAB — RESP PANEL BY RT-PCR (FLU A&B, COVID) ARPGX2
Influenza A by PCR: NEGATIVE
Influenza B by PCR: NEGATIVE
SARS Coronavirus 2 by RT PCR: NEGATIVE

## 2020-04-01 LAB — TROPONIN I (HIGH SENSITIVITY)
Troponin I (High Sensitivity): 11 ng/L (ref <18)
Troponin I (High Sensitivity): 12 ng/L (ref <18)

## 2020-04-01 LAB — LACTIC ACID, PLASMA: Lactic Acid, Venous: 1.2 mmol/L (ref 0.5–1.9)

## 2020-04-01 LAB — AMMONIA: Ammonia: 153 umol/L — ABNORMAL HIGH (ref 9–35)

## 2020-04-01 MED ORDER — PANTOPRAZOLE SODIUM 40 MG PO TBEC
40.0000 mg | DELAYED_RELEASE_TABLET | Freq: Two times a day (BID) | ORAL | Status: DC
  Administered 2020-04-01 – 2020-04-05 (×9): 40 mg via ORAL
  Filled 2020-04-01 (×9): qty 1

## 2020-04-01 MED ORDER — SODIUM CHLORIDE 0.9 % IV SOLN
1.0000 g | Freq: Once | INTRAVENOUS | Status: AC
  Administered 2020-04-01: 1 g via INTRAVENOUS
  Filled 2020-04-01: qty 10

## 2020-04-01 MED ORDER — GUAIFENESIN 100 MG/5ML PO SOLN: 15.0000 mL | ORAL | Status: DC | PRN

## 2020-04-01 MED ORDER — ALBUTEROL SULFATE HFA 108 (90 BASE) MCG/ACT IN AERS
2.0000 | INHALATION_SPRAY | RESPIRATORY_TRACT | Status: DC | PRN
  Administered 2020-04-01 – 2020-04-05 (×5): 2 via RESPIRATORY_TRACT
  Filled 2020-04-01 (×2): qty 6.7

## 2020-04-01 MED ORDER — AMLODIPINE BESYLATE 5 MG PO TABS
5.0000 mg | ORAL_TABLET | Freq: Every day | ORAL | Status: DC
  Administered 2020-04-01 – 2020-04-05 (×5): 5 mg via ORAL
  Filled 2020-04-01 (×5): qty 1

## 2020-04-01 MED ORDER — LACTULOSE 10 GM/15ML PO SOLN
30.0000 g | Freq: Two times a day (BID) | ORAL | Status: DC
  Administered 2020-04-01 – 2020-04-02 (×3): 30 g via ORAL
  Filled 2020-04-01 (×4): qty 45

## 2020-04-01 MED ORDER — ALBUTEROL SULFATE HFA 108 (90 BASE) MCG/ACT IN AERS: 2.0000 | INHALATION_SPRAY | RESPIRATORY_TRACT | Status: DC | PRN

## 2020-04-01 MED ORDER — SODIUM CHLORIDE 0.45 % IV SOLN: INTRAVENOUS | Status: AC

## 2020-04-01 MED ORDER — RIFAXIMIN 550 MG PO TABS
550.0000 mg | ORAL_TABLET | Freq: Two times a day (BID) | ORAL | Status: DC
  Administered 2020-04-01 – 2020-04-02 (×4): 550 mg via ORAL
  Filled 2020-04-01 (×5): qty 1

## 2020-04-01 MED ORDER — ONDANSETRON HCL 4 MG/2ML IJ SOLN: 4.0000 mg | Freq: Four times a day (QID) | INTRAMUSCULAR | Status: DC | PRN

## 2020-04-01 MED ORDER — SODIUM ZIRCONIUM CYCLOSILICATE 10 G PO PACK
10.0000 g | PACK | Freq: Once | ORAL | Status: AC
  Administered 2020-04-01: 10 g via ORAL
  Filled 2020-04-01: qty 1

## 2020-04-01 MED ORDER — LACTULOSE 10 GM/15ML PO SOLN
30.0000 g | Freq: Once | ORAL | Status: AC
  Administered 2020-04-01: 30 g via ORAL
  Filled 2020-04-01: qty 45

## 2020-04-01 MED ORDER — LEVOTHYROXINE SODIUM 50 MCG PO TABS
50.0000 ug | ORAL_TABLET | Freq: Every day | ORAL | Status: DC
  Administered 2020-04-01 – 2020-04-04 (×4): 50 ug via ORAL
  Filled 2020-04-01 (×4): qty 1

## 2020-04-01 MED ORDER — FOLIC ACID 1 MG PO TABS
1.0000 mg | ORAL_TABLET | Freq: Every day | ORAL | Status: DC
  Administered 2020-04-01 – 2020-04-05 (×5): 1 mg via ORAL
  Filled 2020-04-01 (×5): qty 1

## 2020-04-01 MED ORDER — IPRATROPIUM-ALBUTEROL 0.5-2.5 (3) MG/3ML IN SOLN
3.0000 mL | Freq: Four times a day (QID) | RESPIRATORY_TRACT | Status: DC
  Administered 2020-04-01 (×2): 3 mL via RESPIRATORY_TRACT
  Filled 2020-04-01 (×2): qty 3

## 2020-04-01 MED ORDER — SODIUM BICARBONATE 650 MG PO TABS
650.0000 mg | ORAL_TABLET | Freq: Two times a day (BID) | ORAL | Status: DC
Start: 2020-04-01 — End: 2020-04-05
  Administered 2020-04-01 – 2020-04-05 (×9): 650 mg via ORAL
  Filled 2020-04-01 (×10): qty 1

## 2020-04-01 MED ORDER — CARVEDILOL 12.5 MG PO TABS
12.5000 mg | ORAL_TABLET | Freq: Two times a day (BID) | ORAL | Status: DC
  Administered 2020-04-01 – 2020-04-05 (×9): 12.5 mg via ORAL
  Filled 2020-04-01 (×8): qty 1

## 2020-04-01 MED ORDER — ONDANSETRON HCL 4 MG PO TABS: 4.0000 mg | ORAL_TABLET | Freq: Four times a day (QID) | ORAL | Status: DC | PRN
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
## 2020-04-01 NOTE — ED Notes (Signed)
Pt placed on cardiac monitor in the hall. Pt VSS. Pt denies any pain at this time

## 2020-04-01 NOTE — ED Notes (Signed)
Pt brief changed.  
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
## 2020-04-01 NOTE — ED Triage Notes (Signed)
Pt brought to ED by [facility]  via stretcher from local nursing facility with c/o AMS x past day. EMS reports recent lab draw noted pt ammonia lev el to be 121.0 umol/L. Pt has history of same. Also reports possible aspiration of potato chips one week ago. Pt AOX4 at time of triage and answers all questions appropriate. NAD noted.

## 2020-04-01 NOTE — Progress Notes (Signed)
No charge note  75 year old man with history of alcohol, cocaine, tobacco use, cataracts, chronic kidney disease stage III, CAD, hep C with liver cirrhosis currently living in an SNF was brought into the hospital due to increased confusion.  Patient was found to have elevated ammonia level concerning for hepatic encephalopathy and was admitted to the hospital   Acute hepatic encephalopathy -Patient sluggish on my interview, alert to self but not so much to situation  Hypokalemia -Give Lokelma, gentle IV fluids, follow-up potassium in the morning  Acute kidney injury on chronic kidney disease stage IIIb -Baseline around 1.9, currently at 2.3.  Gentle hydration  Thrombocytopenia -Due to liver disease, no bleeding  Essential hypertension -Continue amlodipine, Norvasc  Hypothyroidism -continue Synthroid  Severe protein calorie malnutrition -Consult dietitian  Scheduled Meds: . amLODipine  5 mg Oral Daily  . carvedilol  12.5 mg Oral BID WC  . folic acid  1 mg Oral Daily  . ipratropium-albuterol  3 mL Nebulization Q6H  . lactulose  30 g Oral BID  . levothyroxine  50 mcg Oral QAC breakfast  . pantoprazole  40 mg Oral BID  . rifaximin  550 mg Oral BID  . sodium bicarbonate  650 mg Oral BID  . sodium zirconium cyclosilicate  10 g Oral Once   Continuous Infusions: . sodium chloride 88 mL/hr at 04/01/20 0649   PRN Meds:.albuterol, guaiFENesin, ondansetron **OR** ondansetron (ZOFRAN) IV   Costin M. Cruzita Lederer, MD, PhD [facility]  Between 7 am - 7 pm you can contact me via [place] or [place].  I am not available 7 pm - 7 am, please contact night coverage MD/APP via Amion

## 2020-04-01 NOTE — ED Notes (Signed)
Called to give report ... per [facility] CN has not reviewed the patient and has not approved the room

## 2020-04-02 DIAGNOSIS — N189 Chronic kidney disease, unspecified: Secondary | ICD-10-CM | POA: Diagnosis not present

## 2020-04-02 DIAGNOSIS — I10 Essential (primary) hypertension: Secondary | ICD-10-CM | POA: Diagnosis not present

## 2020-04-02 DIAGNOSIS — N179 Acute kidney failure, unspecified: Secondary | ICD-10-CM | POA: Diagnosis present

## 2020-04-02 DIAGNOSIS — R600 Localized edema: Secondary | ICD-10-CM | POA: Diagnosis present

## 2020-04-02 DIAGNOSIS — Z66 Do not resuscitate: Secondary | ICD-10-CM | POA: Diagnosis present

## 2020-04-02 DIAGNOSIS — E785 Hyperlipidemia, unspecified: Secondary | ICD-10-CM | POA: Diagnosis present

## 2020-04-02 DIAGNOSIS — F32A Depression, unspecified: Secondary | ICD-10-CM | POA: Diagnosis present

## 2020-04-02 DIAGNOSIS — Z20822 Contact with and (suspected) exposure to covid-19: Secondary | ICD-10-CM | POA: Diagnosis present

## 2020-04-02 DIAGNOSIS — F1010 Alcohol abuse, uncomplicated: Secondary | ICD-10-CM | POA: Diagnosis present

## 2020-04-02 DIAGNOSIS — K7290 Hepatic failure, unspecified without coma: Secondary | ICD-10-CM | POA: Diagnosis not present

## 2020-04-02 DIAGNOSIS — D6959 Other secondary thrombocytopenia: Secondary | ICD-10-CM | POA: Diagnosis present

## 2020-04-02 DIAGNOSIS — I2510 Atherosclerotic heart disease of native coronary artery without angina pectoris: Secondary | ICD-10-CM | POA: Diagnosis present

## 2020-04-02 DIAGNOSIS — K7200 Acute and subacute hepatic failure without coma: Secondary | ICD-10-CM | POA: Diagnosis present

## 2020-04-02 DIAGNOSIS — N390 Urinary tract infection, site not specified: Secondary | ICD-10-CM | POA: Diagnosis present

## 2020-04-02 DIAGNOSIS — E43 Unspecified severe protein-calorie malnutrition: Secondary | ICD-10-CM | POA: Diagnosis present

## 2020-04-02 DIAGNOSIS — I129 Hypertensive chronic kidney disease with stage 1 through stage 4 chronic kidney disease, or unspecified chronic kidney disease: Secondary | ICD-10-CM | POA: Diagnosis present

## 2020-04-02 DIAGNOSIS — B1920 Unspecified viral hepatitis C without hepatic coma: Secondary | ICD-10-CM | POA: Diagnosis present

## 2020-04-02 DIAGNOSIS — E872 Acidosis: Secondary | ICD-10-CM | POA: Diagnosis present

## 2020-04-02 DIAGNOSIS — E440 Moderate protein-calorie malnutrition: Secondary | ICD-10-CM | POA: Diagnosis not present

## 2020-04-02 DIAGNOSIS — E875 Hyperkalemia: Secondary | ICD-10-CM | POA: Diagnosis present

## 2020-04-02 DIAGNOSIS — W3400XS Accidental discharge from unspecified firearms or gun, sequela: Secondary | ICD-10-CM | POA: Diagnosis not present

## 2020-04-02 DIAGNOSIS — M109 Gout, unspecified: Secondary | ICD-10-CM | POA: Diagnosis present

## 2020-04-02 DIAGNOSIS — R197 Diarrhea, unspecified: Secondary | ICD-10-CM | POA: Diagnosis present

## 2020-04-02 DIAGNOSIS — F1410 Cocaine abuse, uncomplicated: Secondary | ICD-10-CM | POA: Diagnosis present

## 2020-04-02 DIAGNOSIS — B964 Proteus (mirabilis) (morganii) as the cause of diseases classified elsewhere: Secondary | ICD-10-CM | POA: Diagnosis present

## 2020-04-02 DIAGNOSIS — R059 Cough, unspecified: Secondary | ICD-10-CM | POA: Diagnosis present

## 2020-04-02 DIAGNOSIS — N1832 Chronic kidney disease, stage 3b: Secondary | ICD-10-CM | POA: Diagnosis present

## 2020-04-02 DIAGNOSIS — F17210 Nicotine dependence, cigarettes, uncomplicated: Secondary | ICD-10-CM | POA: Diagnosis present

## 2020-04-02 DIAGNOSIS — E039 Hypothyroidism, unspecified: Secondary | ICD-10-CM | POA: Diagnosis present

## 2020-04-02 LAB — COMPREHENSIVE METABOLIC PANEL
ALT: 32 U/L (ref 0–44)
AST: 59 U/L — ABNORMAL HIGH (ref 15–41)
Albumin: 1.8 g/dL — ABNORMAL LOW (ref 3.5–5.0)
Alkaline Phosphatase: 137 U/L — ABNORMAL HIGH (ref 38–126)
Anion gap: 9 (ref 5–15)
BUN: 33 mg/dL — ABNORMAL HIGH (ref 8–23)
CO2: 14 mmol/L — ABNORMAL LOW (ref 22–32)
Calcium: 7.5 mg/dL — ABNORMAL LOW (ref 8.9–10.3)
Chloride: 118 mmol/L — ABNORMAL HIGH (ref 98–111)
Creatinine, Ser: 2.17 mg/dL — ABNORMAL HIGH (ref 0.61–1.24)
GFR, Estimated: 32 mL/min — ABNORMAL LOW (ref >60)
Glucose, Bld: 109 mg/dL — ABNORMAL HIGH (ref 70–99)
Potassium: 4.7 mmol/L (ref 3.5–5.1)
Sodium: 141 mmol/L (ref 135–145)
Total Bilirubin: 1.1 mg/dL (ref 0.3–1.2)
Total Protein: 5.8 g/dL — ABNORMAL LOW (ref 6.5–8.1)

## 2020-04-02 LAB — AMMONIA: Ammonia: 126 umol/L — ABNORMAL HIGH (ref 9–35)

## 2020-04-02 MED ORDER — FLUCONAZOLE 100 MG PO TABS
100.0000 mg | ORAL_TABLET | Freq: Every day | ORAL | Status: DC
  Administered 2020-04-02 – 2020-04-05 (×4): 100 mg via ORAL
  Filled 2020-04-02 (×4): qty 1

## 2020-04-02 MED ORDER — ENSURE ENLIVE PO LIQD
237.0000 mL | Freq: Two times a day (BID) | ORAL | Status: DC
  Administered 2020-04-03 – 2020-04-05 (×6): 237 mL via ORAL

## 2020-04-02 MED ORDER — SODIUM ZIRCONIUM CYCLOSILICATE 10 G PO PACK
10.0000 g | PACK | Freq: Once | ORAL | Status: AC
  Administered 2020-04-02: 10 g via ORAL
  Filled 2020-04-02: qty 1

## 2020-04-02 MED ORDER — SODIUM BICARBONATE 8.4 % IV SOLN
INTRAVENOUS | Status: DC
  Filled 2020-04-02 (×8): qty 850

## 2020-04-02 NOTE — Progress Notes (Addendum)
Initial Nutrition Assessment  DOCUMENTATION CODES:   Non-severe (moderate) malnutrition in context of chronic illness  INTERVENTION:   -Ensure Enlive BID, each supplement provides 350 kcal, 20 grams protein  -MVI with minerals po daily  NUTRITION DIAGNOSIS:   Moderate Malnutrition related to chronic illness (cirrhosis, CKD3b) as evidenced by moderate fat depletion,moderate muscle depletion.  GOAL:   Patient will meet greater than or equal to 90% of their needs  MONITOR:   PO intake,Supplement acceptance,Skin,Labs,Weight trends  REASON FOR ASSESSMENT:   Consult Assessment of nutrition requirement/status  ASSESSMENT:   28 YOM admitted for hepatic encephalopathy. PMH of HLD, HTN, ETOH and drug abuse, Syphilis, CAD, cirrhosis, CKD3b, hep C.  Per chart meal documentation, pt has been consuming 50-100% of meals. Pt reports that he has a good appetite and is enjoying the food he has been given. Pt reports that he has been consuming 75-100% of his trays. Pt mentioned that he had a good appetite PTA and was eating 3 meals a day and sometimes snacks. Pt currently lives at a SNF and reports that he typically consumes what is fixed for him there which he reports is sometimes:  Breakfast - grits, eggs, pancakes, coffee  Lunch - spaghetti with salad and bread or BBQ sandwich with french fries Dinner - meat, starch and vegetable, similar to lunch meals  Snack - fruit or a small sandwich Pt reports that he does not currently consume any nutrition supplements at home, but would be open to trying them while admitted if needed. Pt did not have any teeth, however denied having any dentures or having any chewing difficulties.   Pt reports that he does not know his UBW, but mentioned that he is currently 180# which is also reflected in the chart.   Pt reports that he is mobile at home but does use a regular wheelchair.  Meds reviewed: Folvite (daily), Lactulose (BID), Xifaxan (BID) Labs  reviewed: Potassium (5.8, high), Corrected Calcium (9.06)  NUTRITION - FOCUSED PHYSICAL EXAM:  Flowsheet Row Most Recent Value  Orbital Region Mild depletion  Upper Arm Region No depletion  Thoracic and Lumbar Region No depletion  Buccal Region Mild depletion  Temple Region Mild depletion  Clavicle Bone Region Moderate depletion  Clavicle and Acromion Bone Region Moderate depletion  Scapular Bone Region Unable to assess  Dorsal Hand Mild depletion  Patellar Region Moderate depletion  Anterior Thigh Region Moderate depletion  Posterior Calf Region Moderate depletion  Edema (RD Assessment) None  Hair Reviewed  Eyes Reviewed  Mouth Reviewed  [poor dentition]  Skin Reviewed  Nails Reviewed       Diet Order:   Diet Order            Diet Heart Room service appropriate? Yes; Fluid consistency: Thin  Diet effective now                 EDUCATION NEEDS:   No education needs have been identified at this time  Skin:  Skin Assessment: Reviewed RN Assessment  Last BM:  3/03  Height:   Ht Readings from Last 1 Encounters:  04/01/20 5\' 11"  (1.803 m)    Weight:   Wt Readings from Last 1 Encounters:  04/01/20 81.6 kg    Ideal Body Weight:  78.2 kg  BMI:  Body mass index is 25.1 kg/m.  Estimated Nutritional Needs:   Kcal:  2050-2250  Protein:  100-115 g  Fluid:  >/=2 L    Salvadore Oxford, Dietetic Intern 04/02/2020 4:58 PM

## 2020-04-02 NOTE — Progress Notes (Addendum)
PROGRESS NOTE  Bruce Hayes QMG:[id number] DOB: 10-12-1945 DOA: 04/01/2020 PCP: Lorene Dy, MD   LOS: 0 days   Brief Narrative / Interim history: 75 year old man with history of alcohol, cocaine, tobacco use, cataracts, chronic kidney disease stage III, CAD, hep C with liver cirrhosis currently living in an SNF was brought into the hospital due to increased confusion.  Patient was found to have elevated ammonia level concerning for hepatic encephalopathy and was admitted to the hospital  Subjective / 24h Interval events: Got some better rest last night, he is very talkative this morning.   Assessment & Plan: Principal Problem  Acute hepatic encephalopathy -with significant ammonia elevation, repeat this morning pending   Active Problems Hyperkalemia, AKI on CKD3b, NAGMA -Cr remains elevated when compared to his baseline, remains acidotic and hyperkalemic -repeat Lokelma today, change fluids to sodium bicarb infusion  Possible UTI -with yeast, patient reports some minimal discomfort, start diflucan   Thrombocytopenia -Due to liver disease, no bleeding, plt slightly lower at 64 this morning  Essential hypertension -Continue amlodipine, Norvasc, BP acceptable this morning   Hypothyroidism -continue Synthroid  Severe protein calorie malnutrition -Consult dietitian  He has a durable DNR in bedside chart. Will change code status  Scheduled Meds: . amLODipine  5 mg Oral Daily  . carvedilol  12.5 mg Oral BID WC  . fluconazole  100 mg Oral Daily  . folic acid  1 mg Oral Daily  . ipratropium-albuterol  3 mL Nebulization Q6H  . lactulose  30 g Oral BID  . levothyroxine  50 mcg Oral QAC breakfast  . pantoprazole  40 mg Oral BID  . rifaximin  550 mg Oral BID  . sodium bicarbonate  650 mg Oral BID   Continuous Infusions: . sodium bicarbonate (isotonic) 150 mEq in D5W 1000 mL infusion     PRN Meds:.albuterol, guaiFENesin, ondansetron **OR** ondansetron (ZOFRAN)  IV  Diet Orders (From admission, onward)    Start     Ordered   04/01/20 0454  Diet Heart Room service appropriate? Yes; Fluid consistency: Thin  Diet effective now       Question Answer Comment  Room service appropriate? Yes   Fluid consistency: Thin      04/01/20 0456          DVT prophylaxis: SCDs Start: 04/01/20 0454     Code Status: DNR  Family Communication: d/w sister over the phone  Status is: Observation  The patient will require care spanning > 2 midnights and should be moved to inpatient because: Persistent severe electrolyte disturbances and Inpatient level of care appropriate due to severity of illness  Dispo: The patient is from: SNF              Anticipated d/c is to: SNF              Patient currently is not medically stable to d/c.   Difficult to place patient No   Level of care: Telemetry Medical  Consultants:  None   Procedures:  None   Microbiology  None   Antimicrobials: None     Objective: Vitals:   04/01/20 1928 04/01/20 1954 04/02/20 0122 04/02/20 0419  BP:  (!) 145/62 (!) 143/64 (!) 146/63  Pulse:  66 65 64  Resp:  18 18 18   Temp:  98.2 F (36.8 C) 98.3 F (36.8 C) 98.1 F (36.7 C)  TempSrc:  Oral Oral Oral  SpO2: 100% 100% 100% 100%  Weight:      Height:  Intake/Output Summary (Last 24 hours) at 04/02/2020 0638 Last data filed at 04/02/2020 0435 Gross per 24 hour  Intake 580 ml  Output --  Net 580 ml   Filed Weights   04/01/20 0030  Weight: 81.6 kg    Examination:  Constitutional: NAD Eyes: no scleral icterus ENMT: Mucous membranes are moist.  Neck: normal, supple Respiratory: clear to auscultation bilaterally, no wheezing, no crackles. Normal respiratory effort.  Cardiovascular: Regular rate and rhythm, no murmurs / rubs / gallops. No LE edema. Good peripheral pulses Abdomen: non distended, no tenderness. Bowel sounds positive.  Musculoskeletal: no clubbing / cyanosis.  Skin: no rashes Neurologic: CN  2-12 grossly intact. Strength 5/5 in all 4.   Data Reviewed: I have independently reviewed following labs and imaging studies   CBC: Recent Labs  Lab 04/01/20 0119 04/01/20 0401 04/01/20 1903  WBC 4.9 5.3 4.4  NEUTROABS 2.4  --   --   HGB 9.7* 9.4* 8.9*  HCT 27.4* 27.0* 26.7*  MCV 86.7 87.1 87.5  PLT 70* 68* 64*   Basic Metabolic Panel: Recent Labs  Lab 04/01/20 0119 04/01/20 1903  NA 144 140  K 5.7* 5.8*  CL 124* 120*  CO2 14* 13*  GLUCOSE 101* 100*  BUN 25* 33*  CREATININE 2.33* 2.40*  CALCIUM 7.6* 7.3*   Liver Function Tests: Recent Labs  Lab 04/01/20 0119 04/01/20 1903  AST 59* 52*  ALT 35 30  ALKPHOS 144* 138*  BILITOT 1.3* 1.4*  PROT 6.4* 5.7*  ALBUMIN 1.9* 1.8*   Coagulation Profile: No results for input(s): INR, PROTIME in the last 168 hours. HbA1C: No results for input(s): HGBA1C in the last 72 hours. CBG: No results for input(s): GLUCAP in the last 168 hours.  Recent Results (from the past 240 hour(s))  Culture, blood (Routine X 2) w Reflex to ID Panel     Status: None (Preliminary result)   Collection Time: 04/01/20  3:45 AM   Specimen: BLOOD  Result Value Ref Range Status   Specimen Description BLOOD SITE NOT SPECIFIED  Final   Special Requests   Final    BOTTLES DRAWN AEROBIC AND ANAEROBIC Blood Culture results may not be optimal due to an inadequate volume of blood received in culture bottles   Culture   Final    NO GROWTH < 12 HOURS Performed at [facility] [address] [place], [place] [address]    Report Status PENDING  Incomplete  Culture, blood (Routine X 2) w Reflex to ID Panel     Status: None (Preliminary result)   Collection Time: 04/01/20  4:01 AM   Specimen: BLOOD  Result Value Ref Range Status   Specimen Description BLOOD SITE NOT SPECIFIED  Final   Special Requests   Final    BOTTLES DRAWN AEROBIC AND ANAEROBIC Blood Culture results may not be optimal due to an inadequate volume of blood received in culture  bottles   Culture   Final    NO GROWTH < 12 HOURS Performed at [facility], [place] [address] [place], [place] [address]    Report Status PENDING  Incomplete  Resp Panel by RT-PCR (Flu A&B, Covid) Nasopharyngeal Swab     Status: None   Collection Time: 04/01/20  4:33 AM   Specimen: Nasopharyngeal Swab; Nasopharyngeal(NP) swabs in vial transport medium  Result Value Ref Range Status   SARS Coronavirus 2 by RT PCR NEGATIVE NEGATIVE Final    Comment: (NOTE) SARS-CoV-2 target nucleic acids are  NOT DETECTED.  The SARS-CoV-2 RNA is generally detectable in upper respiratory specimens during the acute phase of infection. The lowest concentration of SARS-CoV-2 viral copies this assay can detect is 138 copies/mL. A negative result does not preclude SARS-Cov-2 infection and should not be used as the sole basis for treatment or other patient management decisions. A negative result may occur with  improper specimen collection/handling, submission of specimen other than nasopharyngeal swab, presence of viral mutation(s) within the areas targeted by this assay, and inadequate number of viral copies(<138 copies/mL). A negative result must be combined with clinical observations, patient history, and epidemiological information. The expected result is Negative.  Fact Sheet for Patients:  EntrepreneurPulse.com.au  Fact Sheet for Healthcare Providers:  IncredibleEmployment.be  This test is no t yet approved or cleared by the [place] FDA and  has been authorized for detection and/or diagnosis of SARS-CoV-2 by FDA under an Emergency Use Authorization (EUA). This EUA will remain  in effect (meaning this test can be used) for the duration of the COVID-19 declaration under Section 564(b)(1) of the Act, 21 U.S.C.section 360bbb-3(b)(1), unless the authorization is terminated  or revoked sooner.       Influenza A by PCR NEGATIVE NEGATIVE Final   Influenza  B by PCR NEGATIVE NEGATIVE Final    Comment: (NOTE) The Xpert Xpress SARS-CoV-2/FLU/RSV plus assay is intended as an aid in the diagnosis of influenza from Nasopharyngeal swab specimens and should not be used as a sole basis for treatment. Nasal washings and aspirates are unacceptable for Xpert Xpress SARS-CoV-2/FLU/RSV testing.  Fact Sheet for Patients: EntrepreneurPulse.com.au  Fact Sheet for Healthcare Providers: IncredibleEmployment.be  This test is not yet approved or cleared by the [place] FDA and has been authorized for detection and/or diagnosis of SARS-CoV-2 by FDA under an Emergency Use Authorization (EUA). This EUA will remain in effect (meaning this test can be used) for the duration of the COVID-19 declaration under Section 564(b)(1) of the Act, 21 U.S.C. section 360bbb-3(b)(1), unless the authorization is terminated or revoked.  Performed at [facility], [place] [address] [place], [place] [address]      Radiology Studies: No results found.   Marzetta Board, MD, PhD [facility]  Between 7 am - 7 pm I am available, please contact me via Amion or Securechat  Between 7 pm - 7 am I am not available, please contact night coverage MD/APP via Amion

## 2020-04-02 NOTE — Plan of Care (Signed)
  Problem: Education: Goal: Knowledge of General Education information will improve Description Including pain rating scale, medication(s)/side effects and non-pharmacologic comfort measures Outcome: Progressing   Problem: Clinical Measurements: Goal: Ability to maintain clinical measurements within normal limits will improve Outcome: Progressing   Problem: Clinical Measurements: Goal: Diagnostic test results will improve Outcome: Progressing   Problem: Activity: Goal: Risk for activity intolerance will decrease Outcome: Progressing   Problem: Pain Managment: Goal: General experience of comfort will improve Outcome: Progressing   

## 2020-04-03 DIAGNOSIS — E440 Moderate protein-calorie malnutrition: Secondary | ICD-10-CM

## 2020-04-03 DIAGNOSIS — N390 Urinary tract infection, site not specified: Secondary | ICD-10-CM

## 2020-04-03 LAB — COMPREHENSIVE METABOLIC PANEL
ALT: 32 U/L (ref 0–44)
AST: 62 U/L — ABNORMAL HIGH (ref 15–41)
Albumin: 1.7 g/dL — ABNORMAL LOW (ref 3.5–5.0)
Alkaline Phosphatase: 134 U/L — ABNORMAL HIGH (ref 38–126)
Anion gap: 8 (ref 5–15)
BUN: 29 mg/dL — ABNORMAL HIGH (ref 8–23)
CO2: 17 mmol/L — ABNORMAL LOW (ref 22–32)
Calcium: 7.3 mg/dL — ABNORMAL LOW (ref 8.9–10.3)
Chloride: 119 mmol/L — ABNORMAL HIGH (ref 98–111)
Creatinine, Ser: 2.24 mg/dL — ABNORMAL HIGH (ref 0.61–1.24)
GFR, Estimated: 30 mL/min — ABNORMAL LOW (ref >60)
Glucose, Bld: 113 mg/dL — ABNORMAL HIGH (ref 70–99)
Potassium: 4.6 mmol/L (ref 3.5–5.1)
Sodium: 144 mmol/L (ref 135–145)
Total Bilirubin: 1.3 mg/dL — ABNORMAL HIGH (ref 0.3–1.2)
Total Protein: 5.7 g/dL — ABNORMAL LOW (ref 6.5–8.1)

## 2020-04-03 LAB — CBC
HCT: 22.4 % — ABNORMAL LOW (ref 39.0–52.0)
Hemoglobin: 8 g/dL — ABNORMAL LOW (ref 13.0–17.0)
MCH: 30.1 pg (ref 26.0–34.0)
MCHC: 35.7 g/dL (ref 30.0–36.0)
MCV: 84.2 fL (ref 80.0–100.0)
Platelets: 64 10*3/uL — ABNORMAL LOW (ref 150–400)
RBC: 2.66 MIL/uL — ABNORMAL LOW (ref 4.22–5.81)
RDW: 15.2 % (ref 11.5–15.5)
WBC: 3.9 10*3/uL — ABNORMAL LOW (ref 4.0–10.5)
nRBC: 0 % (ref 0.0–0.2)

## 2020-04-03 LAB — MAGNESIUM: Magnesium: 2.2 mg/dL (ref 1.7–2.4)

## 2020-04-03 LAB — AMMONIA: Ammonia: 142 umol/L — ABNORMAL HIGH (ref 9–35)

## 2020-04-03 LAB — URINE CULTURE: Culture: >=100000 — AB

## 2020-04-03 LAB — TSH: TSH: 9.722 u[IU]/mL — ABNORMAL HIGH (ref 0.350–4.500)

## 2020-04-03 MED ORDER — SODIUM CHLORIDE 0.9 % IV SOLN
1.0000 g | INTRAVENOUS | Status: DC
  Administered 2020-04-03: 1 g via INTRAVENOUS
  Filled 2020-04-03: qty 1
  Filled 2020-04-03: qty 10

## 2020-04-03 MED ORDER — RIFAXIMIN 550 MG PO TABS
550.0000 mg | ORAL_TABLET | Freq: Three times a day (TID) | ORAL | Status: DC
  Administered 2020-04-03 – 2020-04-05 (×7): 550 mg via ORAL
  Filled 2020-04-03 (×7): qty 1

## 2020-04-03 MED ORDER — LACTULOSE 10 GM/15ML PO SOLN
30.0000 g | Freq: Three times a day (TID) | ORAL | Status: DC
  Administered 2020-04-03 – 2020-04-04 (×5): 30 g via ORAL
  Filled 2020-04-03 (×5): qty 45

## 2020-04-03 NOTE — Progress Notes (Signed)
PROGRESS NOTE  Bruce Hayes EPP:[id number] DOB: May 21, 1945 DOA: 04/01/2020 PCP: Lorene Dy, MD   LOS: 1 day   Brief Narrative / Interim history: 75 year old man with history of alcohol, cocaine, tobacco use, cataracts, chronic kidney disease stage III, CAD, hep C with liver cirrhosis currently living in an SNF was brought into the hospital due to increased confusion.  Patient was found to have elevated ammonia level concerning for hepatic encephalopathy and was admitted to the hospital  Subjective / 24h Interval events: Got some better rest last night, he is very talkative this morning.   Assessment & Plan: Principal Problem  Acute hepatic encephalopathy -His ammonia is remains elevated, he is however alert and oriented x4 although somewhat appears slow to process and answer questions.  Increase lactulose today.  He did not have a lot of infectious type symptoms however urine cultures show Proteus, given persistently elevated ammonia would favor to treat.  Start empiric ceftriaxone  Active Problems Hyperkalemia, AKI on CKD3b, NAGMA -Creatinine still remains elevated when compared to his baseline but stable over the last day, I wonder whether this is his new baseline.  His acidosis is improving and his potassium has now normalized.  We will continue sodium bicarb for another day  UTI -with yeast, patient reports some minimal discomfort, continue Diflucan, also cultures showed Proteus as above, on ceftriaxone now  Thrombocytopenia -Due to liver disease, no bleeding, platelets overall stable  Essential hypertension -Continue amlodipine, Norvasc, blood pressure acceptable  Hypothyroidism -continue Synthroid  Severe protein calorie malnutrition -Consult dietitian   Scheduled Meds: . amLODipine  5 mg Oral Daily  . carvedilol  12.5 mg Oral BID WC  . feeding supplement  237 mL Oral BID BM  . fluconazole  100 mg Oral Daily  . folic acid  1 mg Oral Daily  . lactulose  30 g  Oral TID  . levothyroxine  50 mcg Oral QAC breakfast  . pantoprazole  40 mg Oral BID  . rifaximin  550 mg Oral TID  . sodium bicarbonate  650 mg Oral BID   Continuous Infusions: . sodium bicarbonate (isotonic) 150 mEq in D5W 1000 mL infusion 75 mL/hr at 04/02/20 2212   PRN Meds:.albuterol, guaiFENesin, ondansetron **OR** ondansetron (ZOFRAN) IV  Diet Orders (From admission, onward)    Start     Ordered   04/01/20 0454  Diet Heart Room service appropriate? Yes; Fluid consistency: Thin  Diet effective now       Question Answer Comment  Room service appropriate? Yes   Fluid consistency: Thin      04/01/20 0456          DVT prophylaxis: SCDs Start: 04/01/20 0454     Code Status: DNR  Family Communication: Updated sister over the phone  Status is: Inpatient  The patient will require care spanning > 2 midnights and should be moved to inpatient because: Persistent severe electrolyte disturbances and Inpatient level of care appropriate due to severity of illness  Dispo: The patient is from: SNF              Anticipated d/c is to: SNF              Patient currently is not medically stable to d/c.   Difficult to place patient No   Level of care: Telemetry Medical  Consultants:  None   Procedures:  None   Microbiology  None   Antimicrobials: None     Objective: Vitals:   04/02/20 0419 04/02/20 1425  04/02/20 1946 04/03/20 0500  BP: (!) 146/63 (!) 148/77 133/89 (!) (P) 154/63  Pulse: 64 70 81 (P) 100  Resp: 18 17 (!) 21 (P) 20  Temp: 98.1 F (36.7 C) 97.6 F (36.4 C) (!) 97.5 F (36.4 C) (P) 98.3 F (36.8 C)  TempSrc: Oral Oral Oral (P) Oral  SpO2: 100% 100% 100%   Weight:      Height:        Intake/Output Summary (Last 24 hours) at 04/03/2020 1109 Last data filed at 04/02/2020 1949 Gross per 24 hour  Intake 358 ml  Output -  Net 358 ml   Filed Weights   04/01/20 0030  Weight: 81.6 kg    Examination:  Constitutional: nad Eyes: no icterus  ENMT:  mmm Neck: normal, supple Respiratory: cta biL, no wheezing, no crackles Cardiovascular: rrr, no mrg, trace edema Abdomen: soft, nt, nd, bs+ Musculoskeletal: no clubbing / cyanosis.  Skin: no rashes Neurologic: non focal. No asterixis  Data Reviewed: I have independently reviewed following labs and imaging studies   CBC: Recent Labs  Lab 04/01/20 0119 04/01/20 0401 04/01/20 1903 04/03/20 0557  WBC 4.9 5.3 4.4 3.9*  NEUTROABS 2.4  --   --   --   HGB 9.7* 9.4* 8.9* 8.0*  HCT 27.4* 27.0* 26.7* 22.4*  MCV 86.7 87.1 87.5 84.2  PLT 70* 68* 64* 64*   Basic Metabolic Panel: Recent Labs  Lab 04/01/20 0119 04/01/20 1903 04/02/20 1055 04/03/20 0557  NA 144 140 141 144  K 5.7* 5.8* 4.7 4.6  CL 124* 120* 118* 119*  CO2 14* 13* 14* 17*  GLUCOSE 101* 100* 109* 113*  BUN 25* 33* 33* 29*  CREATININE 2.33* 2.40* 2.17* 2.24*  CALCIUM 7.6* 7.3* 7.5* 7.3*  MG  --   --   --  2.2   Liver Function Tests: Recent Labs  Lab 04/01/20 0119 04/01/20 1903 04/02/20 1055 04/03/20 0557  AST 59* 52* 59* 62*  ALT 35 30 32 32  ALKPHOS 144* 138* 137* 134*  BILITOT 1.3* 1.4* 1.1 1.3*  PROT 6.4* 5.7* 5.8* 5.7*  ALBUMIN 1.9* 1.8* 1.8* 1.7*   Coagulation Profile: No results for input(s): INR, PROTIME in the last 168 hours. HbA1C: No results for input(s): HGBA1C in the last 72 hours. CBG: No results for input(s): GLUCAP in the last 168 hours.  Recent Results (from the past 240 hour(s))  Urine Culture     Status: Abnormal (Preliminary result)   Collection Time: 04/01/20  2:07 AM   Specimen: Urine, Random  Result Value Ref Range Status   Specimen Description URINE, RANDOM  Final   Special Requests NONE  Final   Culture (A)  Final    >=100,000 COLONIES/mL PROTEUS MIRABILIS SUSCEPTIBILITIES TO FOLLOW Performed at [facility], [address] [place], [place] [address]    Report Status PENDING  Incomplete  Culture, blood (Routine X 2) w Reflex to ID Panel     Status: None  (Preliminary result)   Collection Time: 04/01/20  3:45 AM   Specimen: BLOOD  Result Value Ref Range Status   Specimen Description BLOOD SITE NOT SPECIFIED  Final   Special Requests   Final    BOTTLES DRAWN AEROBIC AND ANAEROBIC Blood Culture results may not be optimal due to an inadequate volume of blood received in culture bottles   Culture   Final    NO GROWTH 2 DAYS Performed at [facility], [place] [address] [place], [place] [address]  Report Status PENDING  Incomplete  Culture, blood (Routine X 2) w Reflex to ID Panel     Status: None (Preliminary result)   Collection Time: 04/01/20  4:01 AM   Specimen: BLOOD  Result Value Ref Range Status   Specimen Description BLOOD SITE NOT SPECIFIED  Final   Special Requests   Final    BOTTLES DRAWN AEROBIC AND ANAEROBIC Blood Culture results may not be optimal due to an inadequate volume of blood received in culture bottles   Culture   Final    NO GROWTH 2 DAYS Performed at [facility], [place] [address], [place], [place] [address]    Report Status PENDING  Incomplete  Resp Panel by RT-PCR (Flu A&B, Covid) Nasopharyngeal Swab     Status: None   Collection Time: 04/01/20  4:33 AM   Specimen: Nasopharyngeal Swab; Nasopharyngeal(NP) swabs in vial transport medium  Result Value Ref Range Status   SARS Coronavirus 2 by RT PCR NEGATIVE NEGATIVE Final    Comment: (NOTE) SARS-CoV-2 target nucleic acids are NOT DETECTED.  The SARS-CoV-2 RNA is generally detectable in upper respiratory specimens during the acute phase of infection. The lowest concentration of SARS-CoV-2 viral copies this assay can detect is 138 copies/mL. A negative result does not preclude SARS-Cov-2 infection and should not be used as the sole basis for treatment or other patient management decisions. A negative result may occur with  improper specimen collection/handling, submission of specimen other than nasopharyngeal swab, presence of viral mutation(s) within  the areas targeted by this assay, and inadequate number of viral copies(<138 copies/mL). A negative result must be combined with clinical observations, patient history, and epidemiological information. The expected result is Negative.  Fact Sheet for Patients:  EntrepreneurPulse.com.au  Fact Sheet for Healthcare Providers:  IncredibleEmployment.be  This test is no t yet approved or cleared by the [place] FDA and  has been authorized for detection and/or diagnosis of SARS-CoV-2 by FDA under an Emergency Use Authorization (EUA). This EUA will remain  in effect (meaning this test can be used) for the duration of the COVID-19 declaration under Section 564(b)(1) of the Act, 21 U.S.C.section 360bbb-3(b)(1), unless the authorization is terminated  or revoked sooner.       Influenza A by PCR NEGATIVE NEGATIVE Final   Influenza B by PCR NEGATIVE NEGATIVE Final    Comment: (NOTE) The Xpert Xpress SARS-CoV-2/FLU/RSV plus assay is intended as an aid in the diagnosis of influenza from Nasopharyngeal swab specimens and should not be used as a sole basis for treatment. Nasal washings and aspirates are unacceptable for Xpert Xpress SARS-CoV-2/FLU/RSV testing.  Fact Sheet for Patients: EntrepreneurPulse.com.au  Fact Sheet for Healthcare Providers: IncredibleEmployment.be  This test is not yet approved or cleared by the [place] FDA and has been authorized for detection and/or diagnosis of SARS-CoV-2 by FDA under an Emergency Use Authorization (EUA). This EUA will remain in effect (meaning this test can be used) for the duration of the COVID-19 declaration under Section 564(b)(1) of the Act, 21 U.S.C. section 360bbb-3(b)(1), unless the authorization is terminated or revoked.  Performed at [facility], [place] [address], [place], [place] [address]      Radiology Studies: No results found.   Marzetta Board, MD, PhD [facility]  Between 7 am - 7 pm I am available, please contact me via Amion or Securechat  Between 7 pm - 7 am I am not available, please contact night coverage MD/APP via Amion

## 2020-04-03 NOTE — Evaluation (Signed)
Physical Therapy Evaluation & Discharge Patient Details Name: Bruce Hayes MRN: [id number] DOB: [birth year] Today's Date: 04/03/2020   History of Present Illness  75 y.o. male admitted on 04/01/20 for increased confusion from residential SNF.  Pt dx with acute hepatic encephalopathy, hyperkalemia, possible UTI, thombocytopenia, and severe protein calorie malnutrition. No acute intracranial processes seen on CT of head. Pt with significant PMH of polysubstance abuse, HTN, gout, CAD, cirrhosis, and CKD.  Clinical Impression  Pt presents with condition above and deficits mentioned below, see PT Problem List. Pt reports his presentation this date is his baseline, needing extensive assistance to come to stand. He normally performs lateral scoot transfers between his bed and w/c with assistance at his SNF. He reports not being able to walk and having numbness/tingling in his legs since he fell and hit his head and landed strangely on his legs at his SNF several months ago and has recently been working with PT there. Of note, his L leg appears to be slightly weaker and with less sensation than his R leg. As pt appears at his baseline, no further acute PT services necessary at this time. Recommend pt continue to follow-up with PT in his SNF to maximize his independence and safety with all functional mobility.    Follow Up Recommendations SNF;Supervision/Assistance - 24 hour    Equipment Recommendations  None recommended by PT    Recommendations for Other Services       Precautions / Restrictions Precautions Precautions: Fall Restrictions Weight Bearing Restrictions: No      Mobility  Bed Mobility               General bed mobility comments: Pt sitting up in recliner upon arrival.    Transfers Overall transfer level: Needs assistance Equipment used: Rolling walker (2 wheeled) Transfers: Sit to/from Stand Sit to Stand: Max assist         General transfer comment: Cues for pt to  push up from recliner, maxA to pull pt anteriorly and superiorly to come to stand with assistance for knee flexion in prep for transfer.  Ambulation/Gait             General Gait Details: Unable to ambulate for past few months.  Stairs            Wheelchair Mobility    Modified Rankin (Stroke Patients Only) Modified Rankin (Stroke Patients Only) Pre-Morbid Rankin Score: Severe disability Modified Rankin: Severe disability     Balance Overall balance assessment: Needs assistance Sitting-balance support: Bilateral upper extremity supported Sitting balance-Leahy Scale: Poor Sitting balance - Comments: Pt needing bil UEs pulling on arm rests to keep trunk anterior sitting in recliner, otherwise LOB. Postural control: Posterior lean Standing balance support: Bilateral upper extremity supported Standing balance-Leahy Scale: Poor Standing balance comment: ModA for static standing with bil UEs on RW due to posterior lean.                             Pertinent Vitals/Pain Pain Assessment: No/denies pain    Home Living Family/patient expects to be discharged to:: Skilled nursing facility                      Prior Function Level of Independence: Needs assistance   Gait / Transfers Assistance Needed: per pt he can transfer bed to w/c via lateral scooting with w/c drop arm down with assistance. He reports not walking for the past  few months since he fell at [facility]. He reports working on weight shifting in parallel bars with PT most recently.  ADL's / Homemaking Assistance Needed: assist from SNF staff. He says sometimes he has a BM in the bed, uses a urinal for urinating, and "if there is time" gets assist to his WC, wheel into the bathroom and get assist to the toilet. He reports he is on lactalose, so there isn't often enough time.        Hand Dominance   Dominant Hand: Right    Extremity/Trunk Assessment   Upper Extremity Assessment Upper Extremity  Assessment: Overall WFL for tasks assessed    Lower Extremity Assessment Lower Extremity Assessment: RLE deficits/detail;LLE deficits/detail (pt reports sensation loss being present since his fall a few months ago) RLE Deficits / Details: MMT scores of 4+ to 5 grossly throughout; limited knee flexion ROM RLE Sensation: decreased light touch (throughout) RLE Coordination: decreased gross motor LLE Deficits / Details: MMT scores of 4- to 4+ grossly throughout (slight weakness compared to R side); limited knee flexion ROM LLE Sensation: decreased light touch (less sensation L side compared to R per pt) LLE Coordination: decreased gross motor    Cervical / Trunk Assessment Cervical / Trunk Assessment: Kyphotic  Communication   Communication: No difficulties  Cognition Arousal/Alertness: Awake/alert Behavior During Therapy: WFL for tasks assessed/performed Overall Cognitive Status: Within Functional Limits for tasks assessed                                 General Comments: A&Ox4. Pt with good long-term memory, recalling events and particular techniques in which he transfers at facility.      General Comments      Exercises     Assessment/Plan    PT Assessment All further PT needs can be met in the next venue of care  PT Problem List Decreased strength;Decreased range of motion;Decreased activity tolerance;Decreased balance;Decreased mobility;Decreased coordination;Decreased knowledge of use of DME;Decreased safety awareness;Decreased knowledge of precautions;Impaired sensation       PT Treatment Interventions      PT Goals (Current goals can be found in the Care Plan section)  Acute Rehab PT Goals Patient Stated Goal: to work with PT at [facility] PT Goal Formulation: With patient Time For Goal Achievement: 04/17/20 Potential to Achieve Goals: Fair    Frequency     Barriers to discharge        Co-evaluation               AM-PAC PT "6 Clicks" Mobility   Outcome Measure Help needed turning from your back to your side while in a flat bed without using bedrails?: A Lot Help needed moving from lying on your back to sitting on the side of a flat bed without using bedrails?: A Lot Help needed moving to and from a bed to a chair (including a wheelchair)?: A Lot Help needed standing up from a chair using your arms (e.g., wheelchair or bedside chair)?: A Lot Help needed to walk in hospital room?: Total Help needed climbing 3-5 steps with a railing? : Total 6 Click Score: 10    End of Session Equipment Utilized During Treatment: Gait belt Activity Tolerance: Patient tolerated treatment well Patient left: in chair;with call bell/phone within reach;with chair alarm set   PT Visit Diagnosis: Unsteadiness on feet (R26.81);Muscle weakness (generalized) (M62.81);History of falling (Z91.81);Difficulty in walking, not elsewhere classified (R26.2);Other symptoms and signs  involving the nervous system (R29.898)    Time: 2500-3704 PT Time Calculation (min) (ACUTE ONLY): 19 min   Charges:   PT Evaluation $PT Eval Moderate Complexity: 1 Mod          Moishe Spice, PT, DPT Acute Rehabilitation Services  Pager: [phone] Office: [phone]   Orvan Falconer 04/03/2020, 11:55 AM

## 2020-04-04 LAB — COMPREHENSIVE METABOLIC PANEL
ALT: 32 U/L (ref 0–44)
AST: 66 U/L — ABNORMAL HIGH (ref 15–41)
Albumin: 1.7 g/dL — ABNORMAL LOW (ref 3.5–5.0)
Alkaline Phosphatase: 133 U/L — ABNORMAL HIGH (ref 38–126)
Anion gap: 7 (ref 5–15)
BUN: 26 mg/dL — ABNORMAL HIGH (ref 8–23)
CO2: 22 mmol/L (ref 22–32)
Calcium: 7.4 mg/dL — ABNORMAL LOW (ref 8.9–10.3)
Chloride: 115 mmol/L — ABNORMAL HIGH (ref 98–111)
Creatinine, Ser: 2.2 mg/dL — ABNORMAL HIGH (ref 0.61–1.24)
GFR, Estimated: 31 mL/min — ABNORMAL LOW (ref >60)
Glucose, Bld: 90 mg/dL (ref 70–99)
Potassium: 4.4 mmol/L (ref 3.5–5.1)
Sodium: 144 mmol/L (ref 135–145)
Total Bilirubin: 1.2 mg/dL (ref 0.3–1.2)
Total Protein: 5.7 g/dL — ABNORMAL LOW (ref 6.5–8.1)

## 2020-04-04 LAB — CBC
HCT: 24.5 % — ABNORMAL LOW (ref 39.0–52.0)
Hemoglobin: 8.3 g/dL — ABNORMAL LOW (ref 13.0–17.0)
MCH: 29.1 pg (ref 26.0–34.0)
MCHC: 33.9 g/dL (ref 30.0–36.0)
MCV: 86 fL (ref 80.0–100.0)
Platelets: 62 10*3/uL — ABNORMAL LOW (ref 150–400)
RBC: 2.85 MIL/uL — ABNORMAL LOW (ref 4.22–5.81)
RDW: 15.4 % (ref 11.5–15.5)
WBC: 4.1 10*3/uL (ref 4.0–10.5)
nRBC: 0 % (ref 0.0–0.2)

## 2020-04-04 LAB — AMMONIA: Ammonia: 110 umol/L — ABNORMAL HIGH (ref 9–35)

## 2020-04-04 MED ORDER — AMOXICILLIN-POT CLAVULANATE 875-125 MG PO TABS
1.0000 | ORAL_TABLET | Freq: Two times a day (BID) | ORAL | Status: DC
  Administered 2020-04-04 – 2020-04-05 (×3): 1 via ORAL
  Filled 2020-04-04 (×3): qty 1

## 2020-04-04 NOTE — Progress Notes (Signed)
PROGRESS NOTE  Bruce Hayes:[id number] DOB: 12-Feb-1945 DOA: 04/01/2020 PCP: Lorene Dy, MD   LOS: 2 days   Brief Narrative / Interim history: 75 year old man with history of alcohol, cocaine, tobacco use, cataracts, chronic kidney disease stage III, CAD, hep C with liver cirrhosis currently living in an SNF was brought into the hospital due to increased confusion.  Patient was found to have elevated ammonia level concerning for hepatic encephalopathy and was admitted to the hospital  Subjective / 24h Interval events: Got some better rest last night, he is very talkative this morning.   Assessment & Plan: Principal Problem  Acute hepatic encephalopathy -Ammonia remains elevated however he is alert and oriented x4.  He seems to be tolerating lactulose, rifaximin, continue.  He has no asterixis and otherwise appears to have returned to baseline.  Would not necessarily chase ammonia level further given normal mental status.  If his kidney function is stable he can return to SNF tomorrow  Active Problems Hyperkalemia, AKI on CKD3b, NAGMA -Creatinine still remains elevated when compared to his baseline but stable over the last day, I wonder whether this is his new baseline.  His acidosis resolved.  Proteus UTI, budding yeast in UA -Continue Diflucan, initially on ceftriaxone but now transitioned to Augmentin based on sensitivities for his Proteus  Thrombocytopenia -Due to liver disease, no bleeding, platelets overall stable  Essential hypertension -Continue amlodipine, Norvasc, blood pressure acceptable  Hypothyroidism -continue Synthroid  Severe protein calorie malnutrition -Consult dietitian   Scheduled Meds: . amLODipine  5 mg Oral Daily  . carvedilol  12.5 mg Oral BID WC  . feeding supplement  237 mL Oral BID BM  . fluconazole  100 mg Oral Daily  . folic acid  1 mg Oral Daily  . lactulose  30 g Oral TID  . levothyroxine  50 mcg Oral QAC breakfast  .  pantoprazole  40 mg Oral BID  . rifaximin  550 mg Oral TID  . sodium bicarbonate  650 mg Oral BID   Continuous Infusions: . cefTRIAXone (ROCEPHIN)  IV 1 g (04/03/20 1310)  . sodium bicarbonate (isotonic) 150 mEq in D5W 1000 mL infusion 75 mL/hr at 04/04/20 0543   PRN Meds:.albuterol, guaiFENesin, ondansetron **OR** ondansetron (ZOFRAN) IV  Diet Orders (From admission, onward)    Start     Ordered   04/01/20 0454  Diet Heart Room service appropriate? Yes; Fluid consistency: Thin  Diet effective now       Question Answer Comment  Room service appropriate? Yes   Fluid consistency: Thin      04/01/20 0456          DVT prophylaxis: SCDs Start: 04/01/20 0454     Code Status: DNR  Family Communication: Updated sister over the phone 3/5  Status is: Inpatient  The patient will require care spanning > 2 midnights and should be moved to inpatient because: Persistent severe electrolyte disturbances and Inpatient level of care appropriate due to severity of illness  Dispo: The patient is from: SNF              Anticipated d/c is to: SNF              Patient currently is not medically stable to d/c.   Difficult to place patient No   Level of care: Telemetry Medical  Consultants:  None   Procedures:  None   Microbiology  None   Antimicrobials: None     Objective: Vitals:   04/03/20 0500  04/03/20 1425 04/03/20 2047 04/04/20 0451  BP: (!) 154/63 (!) 160/67 (!) 145/65 (!) 150/72  Pulse: 100 71 77 77  Resp: 20 16 17 17   Temp: 98.3 F (36.8 C) 98.2 F (36.8 C) 98.5 F (36.9 C) 97.9 F (36.6 C)  TempSrc: Oral Oral    SpO2:  99% 100% 100%  Weight:      Height:        Intake/Output Summary (Last 24 hours) at 04/04/2020 1030 Last data filed at 04/04/2020 0543 Gross per 24 hour  Intake 3300.62 ml  Output --  Net 3300.62 ml   Filed Weights   04/01/20 0030  Weight: 81.6 kg    Examination:  Constitutional: No distress Eyes: No icterus ENMT: mmm Neck: normal,  supple Respiratory: Clear bilaterally without wheezing or crackles Cardiovascular: Regular rate and rhythm, no murmurs, trace edema Abdomen: soft, nontender, nondistended, bowel sounds positive Musculoskeletal: no clubbing / cyanosis.  Skin: No rashes seen Neurologic: No focal deficits, no asterixis  Data Reviewed: I have independently reviewed following labs and imaging studies   CBC: Recent Labs  Lab 04/01/20 0119 04/01/20 0401 04/01/20 1903 04/03/20 0557 04/04/20 0649  WBC 4.9 5.3 4.4 3.9* 4.1  NEUTROABS 2.4  --   --   --   --   HGB 9.7* 9.4* 8.9* 8.0* 8.3*  HCT 27.4* 27.0* 26.7* 22.4* 24.5*  MCV 86.7 87.1 87.5 84.2 86.0  PLT 70* 68* 64* 64* 62*   Basic Metabolic Panel: Recent Labs  Lab 04/01/20 0119 04/01/20 1903 04/02/20 1055 04/03/20 0557 04/04/20 0649  NA 144 140 141 144 144  K 5.7* 5.8* 4.7 4.6 4.4  CL 124* 120* 118* 119* 115*  CO2 14* 13* 14* 17* 22  GLUCOSE 101* 100* 109* 113* 90  BUN 25* 33* 33* 29* 26*  CREATININE 2.33* 2.40* 2.17* 2.24* 2.20*  CALCIUM 7.6* 7.3* 7.5* 7.3* 7.4*  MG  --   --   --  2.2  --    Liver Function Tests: Recent Labs  Lab 04/01/20 0119 04/01/20 1903 04/02/20 1055 04/03/20 0557 04/04/20 0649  AST 59* 52* 59* 62* 66*  ALT 35 30 32 32 32  ALKPHOS 144* 138* 137* 134* 133*  BILITOT 1.3* 1.4* 1.1 1.3* 1.2  PROT 6.4* 5.7* 5.8* 5.7* 5.7*  ALBUMIN 1.9* 1.8* 1.8* 1.7* 1.7*   Coagulation Profile: No results for input(s): INR, PROTIME in the last 168 hours. HbA1C: No results for input(s): HGBA1C in the last 72 hours. CBG: No results for input(s): GLUCAP in the last 168 hours.  Recent Results (from the past 240 hour(s))  Urine Culture     Status: Abnormal   Collection Time: 04/01/20  2:07 AM   Specimen: Urine, Random  Result Value Ref Range Status   Specimen Description URINE, RANDOM  Final   Special Requests   Final    NONE Performed at [facility], [address], [place], [place] [address]    Culture >=100,000  COLONIES/mL PROTEUS MIRABILIS (A)  Final   Report Status 04/03/2020 FINAL  Final   Organism ID, Bacteria PROTEUS MIRABILIS (A)  Final      Susceptibility   Proteus mirabilis - MIC*    AMPICILLIN 4 SENSITIVE Sensitive     CEFAZOLIN 8 SENSITIVE Sensitive     CEFEPIME <=0.12 SENSITIVE Sensitive     CEFTRIAXONE <=0.25 SENSITIVE Sensitive     CIPROFLOXACIN <=0.25 SENSITIVE Sensitive     GENTAMICIN <=1 SENSITIVE Sensitive     IMIPENEM 2  SENSITIVE Sensitive     NITROFURANTOIN RESISTANT Resistant     TRIMETH/SULFA <=20 SENSITIVE Sensitive     AMPICILLIN/SULBACTAM <=2 SENSITIVE Sensitive     PIP/TAZO <=4 SENSITIVE Sensitive     * >=100,000 COLONIES/mL PROTEUS MIRABILIS  Culture, blood (Routine X 2) w Reflex to ID Panel     Status: None (Preliminary result)   Collection Time: 04/01/20  3:45 AM   Specimen: BLOOD  Result Value Ref Range Status   Specimen Description BLOOD SITE NOT SPECIFIED  Final   Special Requests   Final    BOTTLES DRAWN AEROBIC AND ANAEROBIC Blood Culture results may not be optimal due to an inadequate volume of blood received in culture bottles   Culture   Final    NO GROWTH 3 DAYS Performed at [facility], [place] [address] [place], [place] [address]    Report Status PENDING  Incomplete  Culture, blood (Routine X 2) w Reflex to ID Panel     Status: None (Preliminary result)   Collection Time: 04/01/20  4:01 AM   Specimen: BLOOD  Result Value Ref Range Status   Specimen Description BLOOD SITE NOT SPECIFIED  Final   Special Requests   Final    BOTTLES DRAWN AEROBIC AND ANAEROBIC Blood Culture results may not be optimal due to an inadequate volume of blood received in culture bottles   Culture   Final    NO GROWTH 3 DAYS Performed at [facility], [place] [address] [place], [place] [address]    Report Status PENDING  Incomplete  Resp Panel by RT-PCR (Flu A&B, Covid) Nasopharyngeal Swab     Status: None   Collection Time: 04/01/20  4:33 AM   Specimen:  Nasopharyngeal Swab; Nasopharyngeal(NP) swabs in vial transport medium  Result Value Ref Range Status   SARS Coronavirus 2 by RT PCR NEGATIVE NEGATIVE Final    Comment: (NOTE) SARS-CoV-2 target nucleic acids are NOT DETECTED.  The SARS-CoV-2 RNA is generally detectable in upper respiratory specimens during the acute phase of infection. The lowest concentration of SARS-CoV-2 viral copies this assay can detect is 138 copies/mL. A negative result does not preclude SARS-Cov-2 infection and should not be used as the sole basis for treatment or other patient management decisions. A negative result may occur with  improper specimen collection/handling, submission of specimen other than nasopharyngeal swab, presence of viral mutation(s) within the areas targeted by this assay, and inadequate number of viral copies(<138 copies/mL). A negative result must be combined with clinical observations, patient history, and epidemiological information. The expected result is Negative.  Fact Sheet for Patients:  EntrepreneurPulse.com.au  Fact Sheet for Healthcare Providers:  IncredibleEmployment.be  This test is no t yet approved or cleared by the [place] FDA and  has been authorized for detection and/or diagnosis of SARS-CoV-2 by FDA under an Emergency Use Authorization (EUA). This EUA will remain  in effect (meaning this test can be used) for the duration of the COVID-19 declaration under Section 564(b)(1) of the Act, 21 U.S.C.section 360bbb-3(b)(1), unless the authorization is terminated  or revoked sooner.       Influenza A by PCR NEGATIVE NEGATIVE Final   Influenza B by PCR NEGATIVE NEGATIVE Final    Comment: (NOTE) The Xpert Xpress SARS-CoV-2/FLU/RSV plus assay is intended as an aid in the diagnosis of influenza from Nasopharyngeal swab specimens and should not be used as a sole basis for treatment. Nasal washings and aspirates are unacceptable for  Xpert Xpress SARS-CoV-2/FLU/RSV  testing.  Fact Sheet for Patients: EntrepreneurPulse.com.au  Fact Sheet for Healthcare Providers: IncredibleEmployment.be  This test is not yet approved or cleared by the [place] FDA and has been authorized for detection and/or diagnosis of SARS-CoV-2 by FDA under an Emergency Use Authorization (EUA). This EUA will remain in effect (meaning this test can be used) for the duration of the COVID-19 declaration under Section 564(b)(1) of the Act, 21 U.S.C. section 360bbb-3(b)(1), unless the authorization is terminated or revoked.  Performed at [facility], [place] [address] [place], [place] [address]      Radiology Studies: No results found.   Marzetta Board, MD, PhD [facility]  Between 7 am - 7 pm I am available, please contact me via Amion or Securechat  Between 7 pm - 7 am I am not available, please contact night coverage MD/APP via Amion

## 2020-04-04 NOTE — TOC Initial Note (Signed)
Transition of Care [facility]) - Initial/Assessment Note    Patient Details  Name: Bruce Hayes MRN: [id number] Date of Birth: 03-06-1945  Transition of Care [facility]) CM/SW Contact:    Trula Ore, [place] Phone Number: 04/04/2020, 3:44 PM  Clinical Narrative:                 CSW received consult for possible SNF placement at time of discharge. CSW spoke with patient regarding PT recommendation of SNF placement at time of discharge. Patient expressed understanding of PT recommendation and is agreeable to SNF placement at time of discharge. Patient comes from [place] term. Plan is for patient to return back to [facility] when medically ready.   Patient has received the COVID vaccines as well as the booster. . No further questions reported at this time. CSW to continue to follow and assist with discharge planning needs.    Expected Discharge Plan: Skilled Nursing Facility Barriers to Discharge: Continued Medical Work up   Patient Goals and CMS Choice Patient states their goals for this hospitalization and ongoing recovery are:: to go to SNF CMS Medicare.gov Compare Post Acute Care list provided to:: Patient Choice offered to / list presented to : Patient  Expected Discharge Plan and Services Expected Discharge Plan: [place] In-house Referral: Clinical Social Work     Living arrangements for the past 2 months: [place]                                      Prior Living Arrangements/Services Living arrangements for the past 2 months: [place] Lives with:: Self,Facility Resident (From [facility] long term) Patient language and need for interpreter reviewed:: Yes Do you feel safe going back to the place where you live?: Yes      Need for Family Participation in Patient Care: Yes (Comment) Care giver support system in place?: Yes (comment)   Criminal Activity/Legal Involvement Pertinent to Current  Situation/Hospitalization: No - Comment as needed  Activities of Daily Living      Permission Sought/Granted Permission sought to share information with : Case Manager,Family Chief Financial Officer Permission granted to share information with : Yes, Verbal Permission Granted  Share Information with NAME: Bruce Hayes  Permission granted to share info w AGENCY: SNF  Permission granted to share info w Relationship: sister  Permission granted to share info w Contact Information: [phone]  Emotional Assessment   Attitude/Demeanor/Rapport: Gracious Affect (typically observed): Calm Orientation: : Oriented to Self,Oriented to Place,Oriented to  Time,Oriented to Situation Alcohol / Substance Use: Not Applicable Psych Involvement: No (comment)  Admission diagnosis:  Hepatic encephalopathy ([place]) [K72.90] Cough [R05.9] Hyperkalemia [E87.5] Urinary tract infection without hematuria, site unspecified [N39.0] AKI (acute kidney injury) ([place]) [N17.9] Patient Active Problem List   Diagnosis Date Noted  . Malnutrition of moderate degree 04/03/2020  . Hypothyroidism   . Protein-calorie malnutrition, severe ([place])   . Metabolic acidosis, NAG, failure of bicarbonate regeneration 03/04/2019  . Symptomatic anemia 03/04/2019  . Sepsis ([place]) 01/27/2019  . E coli bacteremia 01/27/2019  . Positive RPR test 01/27/2019  . History of anemia due to chronic kidney disease 01/27/2019  . Urinary tract infection 01/27/2019  . Encephalopathy 01/24/2019  . Acute on chronic renal failure ([place]) 12/29/2018  . Acute metabolic encephalopathy 82/50/5397  . Hepatic encephalopathy ([place]) 10/31/2018  . Encounter for colonoscopy due to history of adenomatous colonic polyps   .  Benign neoplasm of ascending colon   . Other cirrhosis of liver ([place])   . Gastritis and gastroduodenitis   . Portal hypertensive gastropathy ([place])   . Hypertensive urgency 08/27/2016  . Acute kidney injury superimposed  on CKD ([place]) 08/27/2016  . Headache 08/27/2016  . Blurred vision 08/27/2016  . AKI (acute kidney injury) ([place]) 08/27/2016  . Severe alcohol use disorder ([place]) 11/11/2015  . Alcohol use disorder, severe, dependence ([place]) 11/08/2015  . Alcohol abuse 12/07/2013  . Cocaine abuse ([place]) 12/07/2013  . Polysubstance abuse ([place]) 12/07/2013  . Alcohol use disorder, moderate, dependence ([place]) 12/07/2013  . Depression   . Hyperkalemia 11/28/2011  . Esophageal varices ([place]) 06/02/2008  . 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 CKD (chronic kidney disease), stage III ([place]) 09/06/2007  . HEPATITIS B CARRIER 07/16/2007  . UNSPECIFIED DISORDER OF LIVER 07/03/2007  . HEPATITIS C 05/22/2007  . HYPERLIPIDEMIA 05/22/2007  . OBESITY 05/22/2007  . THROMBOCYTOPENIA 05/22/2007  . TOBACCO ABUSE 05/22/2007  . Essential hypertension 05/22/2007  . COCAINE ABUSE, HX OF 05/22/2007   PCP:  Lorene Dy, MD Pharmacy:   [facility] [place], [place] [address] Phone: [phone] Fax: [phone]     Social Determinants of Health (SDOH) Interventions    Readmission Risk Interventions Readmission Risk Prevention Plan 03/06/2019 12/30/2018 11/02/2018  Transportation Screening Complete Complete Complete  PCP or Specialist Appt within 3-5 Days - - Not Complete  Not Complete comments - - plan for SNF  HRI or [place] - - Not Complete  HRI or Home Care Consult comments - - plan for SNF  Social Work Consult for [place] Planning/Counseling - - Complete  Palliative Care Screening - - Not Applicable  Medication Review (RN Care Manager) Complete Complete Complete  PCP or Specialist appointment within 3-5 days of discharge Complete Not Complete -  PCP/Specialist Appt Not Complete comments - Patient will make own pcp appt;not ready for d/c yet. -  HRI or Home Care Consult - Complete -  SW  Recovery Care/Counseling Consult Complete Complete -  Palliative Care Screening - Not Applicable -  [place] Complete Not Applicable -  Some recent data might be hidden

## 2020-04-04 NOTE — NC FL2 (Signed)
[place] MEDICAID FL2 LEVEL OF CARE SCREENING TOOL     IDENTIFICATION  Patient Name: Bruce Hayes Birthdate: [birth year] Sex: male Admission Date (Current Location): 04/01/2020  [facility] and [place] Number:  Herbalist and Address:  [facility] [place], [place] [address] [place], [place] [address]      Provider Number: [id number]  Attending Physician Name and Address:  Caren Griffins, MD  Relative Name and Phone Number:  [name] [phone]    Current Level of Care: Hospital Recommended Level of Care: [place] Prior Approval Number:    Date Approved/Denied:   PASRR Number: [id number]  Discharge Plan: SNF    Current Diagnoses: Patient Active Problem List   Diagnosis Date Noted  . Malnutrition of moderate degree 04/03/2020  . Hypothyroidism   . Protein-calorie malnutrition, severe ([place])   . Metabolic acidosis, NAG, failure of bicarbonate regeneration 03/04/2019  . Symptomatic anemia 03/04/2019  . Sepsis ([place]) 01/27/2019  . E coli bacteremia 01/27/2019  . Positive RPR test 01/27/2019  . History of anemia due to chronic kidney disease 01/27/2019  . Urinary tract infection 01/27/2019  . Encephalopathy 01/24/2019  . Acute on chronic renal failure ([place]) 12/29/2018  . Acute metabolic encephalopathy 84/13/2440  . Hepatic encephalopathy ([place]) 10/31/2018  . Encounter for colonoscopy due to history of adenomatous colonic polyps   . Benign neoplasm of ascending colon   . Other cirrhosis of liver ([place])   . Gastritis and gastroduodenitis   . Portal hypertensive gastropathy ([place])   . Hypertensive urgency 08/27/2016  . Acute kidney injury superimposed on CKD ([place]) 08/27/2016  . Headache 08/27/2016  . Blurred vision 08/27/2016  . AKI (acute kidney injury) ([place]) 08/27/2016  . Severe alcohol use disorder ([place]) 11/11/2015  . Alcohol use disorder, severe, dependence ([place]) 11/08/2015  . Alcohol abuse 12/07/2013  . Cocaine abuse ([place])  12/07/2013  . Polysubstance abuse ([place]) 12/07/2013  . Alcohol use disorder, moderate, dependence ([place]) 12/07/2013  . Depression   . Hyperkalemia 11/28/2011  . Esophageal varices ([place]) 06/02/2008  . 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 CKD (chronic kidney disease), stage III ([place]) 09/06/2007  . HEPATITIS B CARRIER 07/16/2007  . UNSPECIFIED DISORDER OF LIVER 07/03/2007  . HEPATITIS C 05/22/2007  . HYPERLIPIDEMIA 05/22/2007  . OBESITY 05/22/2007  . THROMBOCYTOPENIA 05/22/2007  . TOBACCO ABUSE 05/22/2007  . Essential hypertension 05/22/2007  . COCAINE ABUSE, HX OF 05/22/2007    Orientation RESPIRATION BLADDER Height & Weight     Self,Time,Situation,Place  Normal Incontinent Weight: 180 lb (81.6 kg) Height:  5\' 11"  (180.3 cm)  BEHAVIORAL SYMPTOMS/MOOD NEUROLOGICAL BOWEL NUTRITION STATUS      Incontinent Diet (See Discharge Summary)  AMBULATORY STATUS COMMUNICATION OF NEEDS Skin   Extensive Assist Verbally Normal                       Personal Care Assistance Level of Assistance  Bathing,Feeding,Dressing Bathing Assistance: Maximum assistance Feeding assistance: Independent Dressing Assistance: Maximum assistance     Functional Limitations Info  Sight,Hearing,Speech Sight Info: Adequate Hearing Info: Adequate Speech Info: Adequate    SPECIAL CARE FACTORS FREQUENCY  PT (By licensed PT),OT (By licensed OT)     PT Frequency: 5x min weekly OT Frequency: 5x min weekly            Contractures Contractures Info: Not present    Additional Factors Info  Code Status,Allergies Code Status Info: DNR Allergies Info: Tylenol (acetaminophen)  Current Medications (04/04/2020):  This is the current hospital active medication list Current Facility-Administered Medications  Medication Dose Route Frequency Provider Last Rate Last Admin  . albuterol (VENTOLIN HFA) 108 (90 Base) MCG/ACT inhaler 2 puff  2 puff Inhalation Q2H PRN Reubin Milan, MD   2 puff  at 04/01/20 [phone]  . amLODipine (NORVASC) tablet 5 mg  5 mg Oral Daily Reubin Milan, MD   5 mg at 04/04/20 1014  . amoxicillin-clavulanate (AUGMENTIN) 875-125 MG per tablet 1 tablet  1 tablet Oral Q12H Gherghe, Costin M, MD      . carvedilol (COREG) tablet 12.5 mg  12.5 mg Oral BID WC Reubin Milan, MD   12.5 mg at 04/04/20 8325  . feeding supplement (ENSURE ENLIVE / ENSURE PLUS) liquid 237 mL  237 mL Oral BID BM Caren Griffins, MD   237 mL at 04/04/20 1024  . fluconazole (DIFLUCAN) tablet 100 mg  100 mg Oral Daily Marzetta Board M, MD   100 mg at 49/82/64 1583  . folic acid (FOLVITE) tablet 1 mg  1 mg Oral Daily Reubin Milan, MD   1 mg at 04/04/20 1014  . guaiFENesin (ROBITUSSIN) 100 MG/5ML solution 300 mg  15 mL Oral Q4H PRN Reubin Milan, MD      . lactulose [facility]) 10 GM/15ML solution 30 g  30 g Oral TID Caren Griffins, MD   30 g at 04/04/20 1023  . levothyroxine (SYNTHROID) tablet 50 mcg  50 mcg Oral QAC breakfast Reubin Milan, MD   50 mcg at 04/04/20 0542  . ondansetron (ZOFRAN) tablet 4 mg  4 mg Oral Q6H PRN Reubin Milan, MD       Or  . ondansetron [facility]) injection 4 mg  4 mg Intravenous Q6H PRN Reubin Milan, MD      . pantoprazole (PROTONIX) EC tablet 40 mg  40 mg Oral BID Reubin Milan, MD   40 mg at 04/04/20 1014  . rifaximin (XIFAXAN) tablet 550 mg  550 mg Oral TID Caren Griffins, MD   550 mg at 04/04/20 1014  . sodium bicarbonate 150 mEq in dextrose 5 % 1,000 mL infusion   Intravenous Continuous Caren Griffins, MD 75 mL/hr at 04/04/20 0543 Infusion Verify at 04/04/20 0543  . sodium bicarbonate tablet 650 mg  650 mg Oral BID Reubin Milan, MD   650 mg at 04/04/20 1014     Discharge Medications: Please see discharge summary for a list of discharge medications.  Relevant Imaging Results:  Relevant Lab Results:   Additional Information [id number]  Trula Ore, LCSWA

## 2020-04-04 NOTE — Progress Notes (Signed)
Pt alert and able to make needs known. Denies pain or discomfort. Needs assistance with adls. Incontinent of bowel and bladder this shift. External catheter in use. Bed alarm in use. Call bell within reach. No concerns voiced this shift.

## 2020-04-05 DIAGNOSIS — E43 Unspecified severe protein-calorie malnutrition: Secondary | ICD-10-CM

## 2020-04-05 LAB — RESP PANEL BY RT-PCR (FLU A&B, COVID) ARPGX2
Influenza A by PCR: NEGATIVE
Influenza B by PCR: NEGATIVE
SARS Coronavirus 2 by RT PCR: NEGATIVE

## 2020-04-05 LAB — COMPREHENSIVE METABOLIC PANEL
ALT: 29 U/L (ref 0–44)
AST: 58 U/L — ABNORMAL HIGH (ref 15–41)
Albumin: 1.6 g/dL — ABNORMAL LOW (ref 3.5–5.0)
Alkaline Phosphatase: 134 U/L — ABNORMAL HIGH (ref 38–126)
Anion gap: 8 (ref 5–15)
BUN: 25 mg/dL — ABNORMAL HIGH (ref 8–23)
CO2: 20 mmol/L — ABNORMAL LOW (ref 22–32)
Calcium: 7.2 mg/dL — ABNORMAL LOW (ref 8.9–10.3)
Chloride: 115 mmol/L — ABNORMAL HIGH (ref 98–111)
Creatinine, Ser: 2.19 mg/dL — ABNORMAL HIGH (ref 0.61–1.24)
GFR, Estimated: 31 mL/min — ABNORMAL LOW (ref >60)
Glucose, Bld: 108 mg/dL — ABNORMAL HIGH (ref 70–99)
Potassium: 3.9 mmol/L (ref 3.5–5.1)
Sodium: 143 mmol/L (ref 135–145)
Total Bilirubin: 1 mg/dL (ref 0.3–1.2)
Total Protein: 5.4 g/dL — ABNORMAL LOW (ref 6.5–8.1)

## 2020-04-05 LAB — AMMONIA: Ammonia: 98 umol/L — ABNORMAL HIGH (ref 9–35)

## 2020-04-05 MED ORDER — OXYCODONE HCL 5 MG PO TABS: 5.0000 mg | ORAL_TABLET | Freq: Three times a day (TID) | ORAL | 0 refills | Status: AC | PRN

## 2020-04-05 MED ORDER — LEVOTHYROXINE SODIUM 75 MCG PO TABS
75.0000 ug | ORAL_TABLET | Freq: Every day | ORAL | Status: DC
  Administered 2020-04-05: 75 ug via ORAL
  Filled 2020-04-05: qty 1

## 2020-04-05 MED ORDER — LACTULOSE 10 GM/15ML PO SOLN: 20.0000 g | Freq: Three times a day (TID) | ORAL | 0 refills | Status: AC

## 2020-04-05 MED ORDER — XIFAXAN 550 MG PO TABS: 550.0000 mg | ORAL_TABLET | Freq: Three times a day (TID) | ORAL | Status: AC

## 2020-04-05 MED ORDER — FLUCONAZOLE 100 MG PO TABS: 100.0000 mg | ORAL_TABLET | Freq: Every day | ORAL | 0 refills | Status: AC

## 2020-04-05 MED ORDER — LEVOTHYROXINE SODIUM 75 MCG PO TABS: 75.0000 ug | ORAL_TABLET | Freq: Every day | ORAL | Status: AC

## 2020-04-05 MED ORDER — AMOXICILLIN-POT CLAVULANATE 875-125 MG PO TABS: 1.0000 | ORAL_TABLET | Freq: Two times a day (BID) | ORAL | 0 refills | Status: AC

## 2020-04-05 NOTE — Care Management Important Message (Signed)
Important Message  Patient Details  Name: Bruce Hayes MRN: [id number] Date of Birth: 1946/01/29   Medicare Important Message Given:  Yes     [name] [address] 04/05/2020, 3:41 PM

## 2020-04-05 NOTE — TOC Transition Note (Signed)
Transition of Care [facility]) - CM/SW Discharge Note   Patient Details  Name: Bruce Hayes MRN: [id number] Date of Birth: 04-06-1945  [facility]) CM/SW Contact:  Emeterio Reeve, [place] Phone Number: 04/05/2020, 12:40 PM   Clinical Narrative:     Pt will discharge back to [facility] via ptar. Pt is covid negative. Pt is aware of discharge.   Nurse to call report to [phone].   Final next level of care: Long Term Nursing Home Barriers to Discharge: Barriers Resolved   Patient Goals and CMS Choice Patient states their goals for this hospitalization and ongoing recovery are:: to go to SNF CMS Medicare.gov Compare Post Acute Care list provided to:: Patient Choice offered to / list presented to : Patient  Discharge Placement              Patient chooses bed at: [facility] Patient to be transferred to facility by: Ptar Name of family member notified: pt was notified Patient and family notified of of transfer: 04/05/20  Discharge Plan and Services In-house Referral: Clinical Social Work                                   Social Determinants of Health ([place]) Interventions     Readmission Risk Interventions Readmission Risk Prevention Plan 03/06/2019 12/30/2018 11/02/2018  Transportation Screening Complete Complete Complete  PCP or Specialist Appt within 3-5 Days - - Not Complete  Not Complete comments - - plan for SNF  HRI or Home Care Consult - - Not Complete  HRI or Home Care Consult comments - - plan for SNF  Social Work Consult for [place] Planning/Counseling - - Complete  Palliative Care Screening - - Not Applicable  Medication Review (RN Care Manager) Complete Complete Complete  PCP or Specialist appointment within 3-5 days of discharge Complete Not Complete -  PCP/Specialist Appt Not Complete comments - Patient will make own pcp appt;not ready for d/c yet. -  [place] or Home Care Consult - Complete -  SW Recovery Care/Counseling Consult  Complete Complete -  Palliative Care Screening - Not Applicable -  [place] Complete Not Applicable -  Some recent data might be hidden   Emeterio Reeve, Latanya Presser, [place] Social Worker [phone]
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
## 2020-04-06 LAB — CULTURE, BLOOD (ROUTINE X 2)
Culture: NO GROWTH
Culture: NO GROWTH

## 2020-06-18 ENCOUNTER — Non-Acute Institutional Stay: Payer: Medicare Other | Admitting: Hospice

## 2020-06-18 ENCOUNTER — Other Ambulatory Visit: Payer: Self-pay

## 2020-06-18 DIAGNOSIS — Z515 Encounter for palliative care: Secondary | ICD-10-CM

## 2020-06-18 DIAGNOSIS — K7469 Other cirrhosis of liver: Secondary | ICD-10-CM

## 2020-06-18 DIAGNOSIS — K7682 Hepatic encephalopathy: Secondary | ICD-10-CM

## 2020-06-18 DIAGNOSIS — K7290 Hepatic failure, unspecified without coma: Secondary | ICD-10-CM
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
